# Patient Record
Sex: Female | Born: 1950 | Race: White | Hispanic: No | Marital: Single | Smoking: Never smoker
Health system: Southern US, Community
[De-identification: ages and names within clinical notes are randomized; demographics above are authoritative.]

## PROBLEM LIST (undated history)

## (undated) DIAGNOSIS — I10 Essential (primary) hypertension: Secondary | ICD-10-CM

## (undated) DIAGNOSIS — E785 Hyperlipidemia, unspecified: Secondary | ICD-10-CM

## (undated) DIAGNOSIS — F32A Depression, unspecified: Secondary | ICD-10-CM

## (undated) DIAGNOSIS — Z87442 Personal history of urinary calculi: Secondary | ICD-10-CM

## (undated) DIAGNOSIS — N189 Chronic kidney disease, unspecified: Secondary | ICD-10-CM

## (undated) DIAGNOSIS — F329 Major depressive disorder, single episode, unspecified: Secondary | ICD-10-CM

## (undated) DIAGNOSIS — J4530 Mild persistent asthma, uncomplicated: Secondary | ICD-10-CM

## (undated) DIAGNOSIS — M545 Low back pain, unspecified: Secondary | ICD-10-CM

## (undated) DIAGNOSIS — M1990 Unspecified osteoarthritis, unspecified site: Secondary | ICD-10-CM

## (undated) DIAGNOSIS — G4700 Insomnia, unspecified: Secondary | ICD-10-CM

## (undated) DIAGNOSIS — G8929 Other chronic pain: Secondary | ICD-10-CM

## (undated) DIAGNOSIS — F411 Generalized anxiety disorder: Secondary | ICD-10-CM

## (undated) DIAGNOSIS — J45909 Unspecified asthma, uncomplicated: Secondary | ICD-10-CM

## (undated) DIAGNOSIS — G2581 Restless legs syndrome: Secondary | ICD-10-CM

## (undated) DIAGNOSIS — T7840XA Allergy, unspecified, initial encounter: Secondary | ICD-10-CM

## (undated) DIAGNOSIS — R0600 Dyspnea, unspecified: Secondary | ICD-10-CM

## (undated) HISTORY — PX: TONSILLECTOMY: SUR1361

## (undated) HISTORY — PX: EXPLORATORY LAPAROTOMY W/ BOWEL RESECTION: SHX1544

## (undated) HISTORY — DX: Depression, unspecified: F32.A

## (undated) HISTORY — DX: Allergy, unspecified, initial encounter: T78.40XA

## (undated) HISTORY — PX: CATARACT EXTRACTION W/ INTRAOCULAR LENS  IMPLANT, BILATERAL: SHX1307

## (undated) HISTORY — PX: ORIF ANKLE FRACTURE: SUR919

## (undated) HISTORY — DX: Essential (primary) hypertension: I10

## (undated) HISTORY — DX: Major depressive disorder, single episode, unspecified: F32.9

## (undated) HISTORY — PX: LITHOTRIPSY: SUR834

## (undated) HISTORY — DX: Hyperlipidemia, unspecified: E78.5

## (undated) HISTORY — DX: Unspecified asthma, uncomplicated: J45.909

---
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
## 1998-08-09 ENCOUNTER — Other Ambulatory Visit: Admission: RE | Admit: 1998-08-09 | Discharge: 1998-08-09 | Payer: Self-pay | Admitting: Obstetrics and Gynecology

## 1999-08-10 ENCOUNTER — Other Ambulatory Visit: Admission: RE | Admit: 1999-08-10 | Discharge: 1999-08-10 | Payer: Self-pay | Admitting: Obstetrics and Gynecology

## 2000-04-11 ENCOUNTER — Inpatient Hospital Stay (HOSPITAL_COMMUNITY): Admission: EM | Admit: 2000-04-11 | Discharge: 2000-04-12 | Payer: Self-pay

## 2000-04-11 ENCOUNTER — Encounter: Payer: Self-pay | Admitting: Emergency Medicine

## 2000-09-13 ENCOUNTER — Other Ambulatory Visit: Admission: RE | Admit: 2000-09-13 | Discharge: 2000-09-13 | Payer: Self-pay | Admitting: Obstetrics and Gynecology

## 2001-08-25 ENCOUNTER — Ambulatory Visit (HOSPITAL_COMMUNITY): Admission: RE | Admit: 2001-08-25 | Discharge: 2001-08-25 | Payer: Self-pay | Admitting: *Deleted

## 2001-10-06 ENCOUNTER — Other Ambulatory Visit: Admission: RE | Admit: 2001-10-06 | Discharge: 2001-10-06 | Payer: Self-pay | Admitting: Obstetrics and Gynecology

## 2002-12-11 ENCOUNTER — Other Ambulatory Visit: Admission: RE | Admit: 2002-12-11 | Discharge: 2002-12-11 | Payer: Self-pay | Admitting: Obstetrics and Gynecology

## 2003-06-05 HISTORY — PX: OTHER SURGICAL HISTORY: SHX169

## 2004-01-03 DIAGNOSIS — Z8719 Personal history of other diseases of the digestive system: Secondary | ICD-10-CM

## 2004-01-03 HISTORY — DX: Personal history of other diseases of the digestive system: Z87.19

## 2004-01-15 ENCOUNTER — Encounter (INDEPENDENT_AMBULATORY_CARE_PROVIDER_SITE_OTHER): Payer: Self-pay | Admitting: Specialist

## 2004-01-15 ENCOUNTER — Inpatient Hospital Stay (HOSPITAL_COMMUNITY): Admission: EM | Admit: 2004-01-15 | Discharge: 2004-01-25 | Payer: Self-pay | Admitting: Emergency Medicine

## 2005-06-04 HISTORY — PX: FRACTURE SURGERY: SHX138

## 2005-06-05 ENCOUNTER — Other Ambulatory Visit: Admission: RE | Admit: 2005-06-05 | Discharge: 2005-06-05 | Payer: Self-pay | Admitting: Obstetrics and Gynecology

## 2006-08-01 ENCOUNTER — Ambulatory Visit (HOSPITAL_BASED_OUTPATIENT_CLINIC_OR_DEPARTMENT_OTHER): Admission: RE | Admit: 2006-08-01 | Discharge: 2006-08-01 | Payer: Self-pay | Admitting: Urology

## 2006-08-15 ENCOUNTER — Ambulatory Visit (HOSPITAL_COMMUNITY): Admission: RE | Admit: 2006-08-15 | Discharge: 2006-08-15 | Payer: Self-pay | Admitting: Urology

## 2006-08-18 ENCOUNTER — Inpatient Hospital Stay (HOSPITAL_COMMUNITY): Admission: EM | Admit: 2006-08-18 | Discharge: 2006-08-20 | Payer: Self-pay | Admitting: Emergency Medicine

## 2006-09-02 ENCOUNTER — Ambulatory Visit (HOSPITAL_COMMUNITY): Admission: RE | Admit: 2006-09-02 | Discharge: 2006-09-02 | Payer: Self-pay | Admitting: Urology

## 2007-02-05 ENCOUNTER — Emergency Department (HOSPITAL_COMMUNITY): Admission: EM | Admit: 2007-02-05 | Discharge: 2007-02-05 | Payer: Self-pay | Admitting: Emergency Medicine

## 2007-02-12 ENCOUNTER — Inpatient Hospital Stay (HOSPITAL_COMMUNITY): Admission: AD | Admit: 2007-02-12 | Discharge: 2007-02-14 | Payer: Self-pay | Admitting: Specialist

## 2007-10-06 ENCOUNTER — Ambulatory Visit (HOSPITAL_COMMUNITY): Admission: RE | Admit: 2007-10-06 | Discharge: 2007-10-06 | Payer: Self-pay | Admitting: Urology

## 2009-08-17 ENCOUNTER — Encounter: Admission: RE | Admit: 2009-08-17 | Discharge: 2009-08-17 | Payer: Self-pay | Admitting: Dermatology

## 2010-10-20 NOTE — H&P (Signed)
[facility]  Patient:    Haley Singh, Haley Singh                        MRN: [id number] Adm. Date:  04/11/00 Attending:  Feliciana Rossetti, M.D.                         History and Physical  CHIEF COMPLAINT:  Lightheadedness.  HISTORY OF PRESENT ILLNESS:  [age]-year-old white female who became acutely ill the morning of admission, vomiting nonbloody, nonbilious material times multiple events.  The vomiting would not stop and patient felt light-headed and as though she might lose consciousness.  For this, she sat down and called 911 and was brought to [facility] Emergency Room. Dr. Ward Givens saw the patient there and noted ketonuria and on exam, deemed the patient to be hypovolemic.  Laboratory evaluation revealed hyperglycemia. Patient also endorses diarrhea, fever, chills.  No chest pain.  No headache. No arm pain or jaw pain.  No skin rash, no breakdown.  No focal numbness, paresthesias or weakness.  She does endorse no appetite and no urine output.  PAST MEDICAL HISTORY:  Osteoarthritis, acne, allergies.  PAST SURGICAL HISTORY:  None.  MEDICINES 1. Accutane. 2. Celebrex 200 mg b.i.d. 3. Effexor 150 mg p.o. q.d. 4. Allegra 180 mg p.o. q.d.  ALLERGIES:  TETRACYCLINE.  FAMILY HISTORY:  Osteoporosis, hypertension, cataracts.  No collagen vascular disease.  Maternal cancer of unknown origin.  SOCIAL HISTORY:  No tobacco.  Minimal ethanol.  No kids.  Patient is unmarried and lives alone.  REVIEW OF SYSTEMS:  No palpitations.  No bleed.  See HPI.  PHYSICAL EXAMINATION  VITAL SIGNS:  88, 120/70, 98, 12.  GENERAL:  Ill-appearing, though nontoxic, in no acute distress.  HEENT:  Pupils are equal, round and reactive to light.  Extraocular movements intact.  Fundi without exudate or hemorrhage.  Mucous membranes are tacky. Oropharynx without lesions.  NECK:  Supple.  Lymph node survey negative.  No carotid bruits.  CARDIAC:  Regular rate and  rhythm.  No murmur, rub or gallop.  LUNGS:  Clear to auscultation.  Good symmetric air movement.  ABDOMEN:  Soft.  No hepatosplenomegaly.  Diffusely tender.  No rebound tenderness.  No guarding.  Normal bowel sounds.  NEUROLOGIC:  Cranial nerves II-XII are grossly within normal limits.  Muscle strength is 5/5 and symmetric.  DTRs are 2 and symmetric.  EXTREMITIES:  No clubbing, cyanosis, or edema.  RECTAL:  Rectal tone is normal.  No blood in vault.  PELVIC:  Deferred to outpatient setting.  BREASTS:  Deferred to outpatient setting.  DATA:  Ketones in the urine.  Glucose over 200.  White blood cell count is elevated and absolute neutrophil count is elevated.  ASSESSMENT AND PLAN 1. Hypovolemia:  Replete. 2. Pain:  Morphine pump. 3. Gastroenteritis:  Conservative management. 4. General anxiety disorder:  Effexor. 5. Hyperglycemia:  Follow as outpatient. DD:  04/12/00 TD:  04/12/00 Job: [id number]

## 2010-10-20 NOTE — Op Note (Signed)
NAME:  Haley Singh, Haley Singh               ACCOUNT NO.:  [id number]   MEDICAL RECORD NO.:  [id number]          PATIENT TYPE:  AMB   LOCATION:  NESC                         FACILITY:  [facility]   PHYSICIAN:  Sigmund I. Patsi Sears, M.D.DATE OF BIRTH:  1951/03/14   DATE OF PROCEDURE:  08/01/2006  DATE OF DISCHARGE:                               OPERATIVE REPORT   PREOPERATIVE DIAGNOSIS:  Bilateral renal calculi (13 mm each).   POSTOPERATIVE DIAGNOSIS:  Bilateral renal calculi (13 mm each).   OPERATION:  Cystourethroscopy, bilateral retrograde pyelogram with  interpretation, and bilateral double-J catheter (Polaris 5-French x 24  cm).   PREPARATION:  Appropriate preanesthesia, the patient was brought to the  operating room and placed on the operating room in the dorsal supine  position where general LMA anesthesia was introduced.  She was then  replaced in the dorsal lithotomy position where the pubis was prepped  with Betadine solution and draped in usual fashion.   REVIEW OF HISTORY:  This 60 year old female has history of Accutane use  for adult acne.  The lumbar spine x-ray to evaluate for possible  Accutane toxicity revealed no abnormality of the spine, but did reveal  bilateral renal calculi.  Further evaluation revealed 13-mm calculi  bilaterally.  In addition, the patient has three stones on the right  side and two on the left side.  The large stones are at the  ureteropelvic junction.  She is now for cystoscopy, retrograde  pyelography, double-J catheter placement, and then lithotripsy.   PROCEDURE:  Cystourethroscopy accomplished and showed normal-appearing  bladder.  Bilateral retrograde pyelogram was performed, and showed that  the patient had normal caliber ureters with no evidence of dilation, no  stones, and no tortuosity.  The 13-mm renal pelvic stones were  identified, at the UPJ bilaterally, but there was no proximal  hydronephrosis.  The guidewire was passed easily around the  kidney, and  Polaris stents, 5-French x 24 cm were passed bilaterally into the  kidney, with the loop ending in the bladder.  Photo documentation was  accomplished.  The patient was then given IV Toradol, awakened, and  taken to the recovery room in good condition.      Sigmund I. Patsi Sears, M.D.  Electronically Signed     SIT/MEDQ  D:  08/01/2006  T:  08/01/2006  Job:  [id number]

## 2010-10-20 NOTE — Consult Note (Signed)
Haley Singh, Haley Singh               ACCOUNT NO.:  [id number]   MEDICAL RECORD NO.:  [id number]          PATIENT TYPE:  INP   LOCATION:  1413                         FACILITY:  [facility]   PHYSICIAN:  Maretta Bees. Vonita Moss, M.D.DATE OF BIRTH:  [birth year]   DATE OF CONSULTATION:  08/18/2006  DATE OF DISCHARGE:                                 CONSULTATION   I was asked by Dr. Donnetta Hutching to evaluate this lady who is a patient of  Dr. Patsi Sears.  She is 60 years old and had a back x-ray done and  coincidently was found to have bilateral renal stones, and she saw Dr.  Patsi Sears, who has subsequently placed bilateral double-J catheters,  and she underwent left renal lithotripsy and August 15, 2006.  She was  sent home on Septra.  She has had left flank pain and fever up to 102  over the last 2 days.  She is feeling very, very poorly.  She was seen  in the emergency room, and her urine specimen and 21-50 white cells,  many bacteria, and 11-20 red cells.  White count was slightly high at  12,600.  CT scan showed mild left hydronephrosis with interval  enlargement of the left kidney and interval left perinephric soft tissue  stranding, consistent with pyelonephritis and/or obstruction.  She had a  3 mm stone fragment in the left upper ureter, and also stones were seen  in both kidneys.  She has had a remote history of UTIs.   MEDICAL HISTORY:  1. Asthma.  2. Hypertension.  3. Depression.  4. Hyperlipidemia.   PREVIOUS SURGERIES:  Included bowel resection.   She does not smoke.  She does not drink alcohol.   FAMILY HISTORY:  Noncontributory.   ALLERGIES:  TETRACYCLINE.   MEDICATIONS:  1. Septra, as noted above.  2. Effexor XR 150 mg daily.  3. Percocet p.r.n. pain.  4. Singulair 10 mg once daily.  5. Vesicare 5 mg once a day.  6. Lotrel 40 mg daily.  7. Zyrtec-D 10 mg a day.  8. Accutane 20 mg a day.  9. Lipitor 40 mg a day.  10.Fosamax plus D 70 mg weekly.  11.Remeron 15 mg at  bedtime.  12.Advair Diskus inhaler 250/50 b.i.d.  13.Albuterol inhaler p.r.n.  14.Calcium 1200 mg b.i.d.  15.Valium 10 mg p.r.n. anxiety.   PHYSICAL EXAMINATION:  VITAL SIGNS:  Blood pressure 140/88, pulse 96,  temperature 100.6.  GENERAL:  She seems alert and oriented.  No severe distress at the  moment, and no respiratory distress.  SKIN:  Warm and dry.  ABDOMEN:  Nontender, with just some mild guarding in both flanks.   IMPRESSION:  1. Fever.  Rule out urinary tract infection.  2. Bilateral renal stones and double-J stents.  3. Depression.  4. Environmental allergies.  5. Hyperlipidemia.  6. Osteoporosis.  7. Asthma.   PLAN:  Admit for IV antibiotics.  Urine and blood cultures are pending.      Maretta Bees. Vonita Moss, M.D.  Electronically Signed     LJP/MEDQ  D:  08/18/2006  T:  08/19/2006  Job:  [id number] 

## 2010-10-20 NOTE — Discharge Summary (Signed)
[facility]  Patient:    Haley Singh, Haley Singh                      MRN: [id number] Adm. Date:  [id number] Disc. Date: [id number] Attending:  Feliciana Rossetti                           Discharge Summary  DISCHARGE DIAGNOSES: 1. Hypovolemia. 2. Gastroenteritis. 3. Generalized anxiety disorder. 4. Hyperglycemia. 5. Ketonuria.  REASON FOR HOSPITALIZATION:  Hypovolemia.  HOSPITAL COURSE:  The patient was admitted to a general medical bed and treated with bowel rest and intravenous hydration and morphine PCA pump for pain.  The patient responded well to this conservative management, and by day #2 of hospitalization felt return of her appetite, had to further diarrhea, was not nauseated, and on requiring antinausea medicines.  CONDITION AT DISCHARGE:  The patient is well appearing with normal and stable vital signs as follows:  Heart rate 88, blood pressure 120/80, temperature 98, respiratory rate 10.  DISCHARGE MEDICATIONS: 1. Effexor 150 mg one q.d. 2. Celebrex 200 mg p.o. b.i.d. 3. Allegra 180 mg p.o. q.d. 4. Accutane as directed.  FOLLOWUP:  Next follow-up is with Dr. Quintella Reichert two weeks following discharge to follow up on elevated glucose, abnormal urinalysis, and elevated white cell count. DD:  04/12/00 TD:  04/12/00 Job: [id number]

## 2010-10-20 NOTE — Discharge Summary (Signed)
NAME:  Haley Singh, HEMMINGWAY                         ACCOUNT NO.:  [id number]   MEDICAL RECORD NO.:  [id number]                   PATIENT TYPE:  INP   LOCATION:  0446                                 FACILITY:  [facility]   PHYSICIAN:  Currie Paris, M.D.           DATE OF BIRTH:  Mar 06, 1951   DATE OF ADMISSION:  01/15/2004  DATE OF DISCHARGE:  01/25/2004                                 DISCHARGE SUMMARY   FINAL DIAGNOSES:  1.  Acute small bowel obstruction secondary to luminal narrowing of      uncertain etiology.  2.  Postoperative wound infection.   HISTORY:  [name] is a generally healthy 60 year old who presented with  acute abdominal pain.  X-rays appeared to show small bowel obstruction.   HOSPITAL COURSE:  The patient was admitted and taken to the operating room  where a laparotomy and a small bowel resection were performed.  She had what  appeared to be a vascular anomaly coming across her small bowel and mid  ileum causing a complete obstruction.  She had a small bowel resection and  primary anastomosis.   Postoperatively, she initially had a fairly benign course.  She had some low-  grade fevers initially that cleared up.  Her NG was discontinued on the  third postoperative day.  She was able to begin some diet on the fourth and  fifth postoperative days, but continued to run slight elevation in her white  count.  Wound looked okay initially, but by the sixth postoperative day was  developing a little bit of inflammatory changes.  We had put wound wicks in  to try to prevent the wound infection, but by January 22, 2004, she appeared  to be developing more likely wound infection and her wound had to be opened.  She was then able to be discharged on January 25, 2004, with her wound stable  and improving, tolerating diet, and overall doing well.   LABORATORY DATA:  Initial hemoglobin of 16.7, postoperatively following  hydration down to 13.4. White count was initially 19,000,  came down to  11,000.  Her electrolytes were basically unremarkable.  EKG showed normal  sinus rhythm.   The patient is to be followed up in my office in approximately two weeks.                                               Currie Paris, M.D.    CJS/MEDQ  D:  02/01/2004  T:  02/01/2004  Job:  [id number]

## 2010-10-20 NOTE — Discharge Summary (Signed)
Haley Singh, PEED               ACCOUNT NO.:  [id number]   MEDICAL RECORD NO.:  [id number]          PATIENT TYPE:  INP   LOCATION:  1540                         FACILITY:  [facility]   PHYSICIAN:  Erasmo Leventhal, M.D.DATE OF BIRTH:  04-03-1951   DATE OF ADMISSION:  02/12/2007  DATE OF DISCHARGE:  02/14/2007                               DISCHARGE SUMMARY   ADMISSION DIAGNOSIS:  Intractable pain postop outpatient surgery with  open reduction and internal fixation of right ankle.   DISCHARGE DIAGNOSIS:  Intractable pain postop outpatient surgery with  open reduction and internal fixation of right ankle.   PROCEDURES:  None.   BRIEF HISTORY:  This is a 60 year old lady who was admitted to the  hospital after having outpatient ORIF of her ankle with intractable  pain.  She was admitted to the hospital for pain control and nursing  care.   LABORATORY VALUES:  None.   COURSE IN THE HOSPITAL:  Vital signs were stable.  The patient was alert  and oriented.  No chest pain, shortness of breath, lightheadedness.  Splint was okay.  Neurovascular status intact in her foot, and pain was  under control.  Subsequently, the patient was discharged home for  followup in the office.   CONDITION ON DISCHARGE:  Improved.   DISCHARGE MEDICATIONS:  1. Dilaudid 2 mg p.o. q.4-6h. p.r.n. pain.  2. Elavil 25 mg p.o. q.h.s.  3. Valium 5 mg one p.o. q.8h. p.r.n. nervousness.   DISCHARGE INSTRUCTIONS:  She is to elevate and ice the ankle.  Do no  weightbearing on the ankle.  Use her crutches and call if problems or  questions arise.      Jaquelyn Bitter. Chabon, P.A.    ______________________________  Erasmo Leventhal, M.D.    SJC/MEDQ  D:  02/24/2007  T:  02/25/2007  Job:  [id number]

## 2010-10-20 NOTE — Discharge Summary (Signed)
NAMEZERA, Haley Singh               ACCOUNT NO.:  [id number]   MEDICAL RECORD NO.:  [id number]          PATIENT TYPE:  INP   LOCATION:  1413                         FACILITY:  [facility]   PHYSICIAN:  Sigmund I. Patsi Sears, M.D.DATE OF BIRTH:  [birth year]   DATE OF ADMISSION:  08/18/2006  DATE OF DISCHARGE:  08/20/2006                               DISCHARGE SUMMARY   FINAL DIAGNOSES:  1. Fever and urinary tract infection.  2. Bilateral renal stones with double-J stent post lithotripsy.  3. Depression.  4. Environmental allergies.  5. Hyperlipidemia.  6. Osteoporosis.  7. Asthma.   SURGERY THIS ADMISSION:  None.   HISTORY:  Ms. Deupree is a 60 year old female with a history of  bilateral multiple renal stones.  She is status post bilateral double-J  stents and now is status post lithotripsy.  The patient has had fevers  and flank pain and was seen in the emergency room with a temperature of  102 and dehydration with urinalysis showing 21-50 white cells, many  bacteria, 11-20 red cells.  White count was 12,600.  CT showed mild left  hydronephrosis with interval enlargement of the left kidney and left  perinephric soft tissue stranding, consistent with pyelonephritis.  A 3  mm stone fragment was noted in the left upper ureter, double-J catheters  were in place.  She was admitted for IV hydration and antibiotic.   PAST MEDICAL. HISTORY:  1. Asthma.  2. Hypertension.  3. Depression.  4. Hyperlipidemia.   PAST SURGICAL HISTORY:  Bowel resection.   SOCIAL HISTORY:  Tobacco is none, alcohol is none.   FAMILY HISTORY:  Noncontributory.   ALLERGIES:  TETRACYCLINE.   CURRENT MEDICATIONS:  1. Septra 1 per day.  2. Effexor XR 150 mg a day.  3. Percocet p.r.n. pain.  4. Singulair 10 mg p.o. daily.  5. Vesicare 5 mg per day.  6. Lotrel 40 mg p.o. daily.  7. Zyrtec 10 mg 1 per day.  8. Accutane 20 mg a day.  9. Lipitor 40 mg a day.  10.Fosamax D 70 mg per week.  11.Remeron 50 mg  nightly.  12.Advair discus inhaler 250/50 b.i.d.  13.Albuterol inhaler p.r.n.  14.Calcium 1200 mg b.i.d.  15.Valium 10 mg p.r.n. anxiety.   PHYSICAL EXAMINATION:  As noted on physical exam on admission H&P on  August 18, 2006.   HOSPITAL COURSE:  Patient was admitted to the hospital, placed on  Tylenol and IV Cipro.  She recovered and is seen on March 18 with a  temperature of 99 and some sweating but feels much better.  The patient  will be discharged on Cipro 500 mg b.i.d. and Percocet p.r.n. pain.  Her  KUB shows a 3 mm segment in the left upper ureter and also a renal stone  within the left kidney.  The right kidney has three renal stones.  The  patient will need lithotripsy in approximately 2-3 weeks.  She is  discharged in stable condion.      Sigmund I. Patsi Sears, M.D.  Electronically Signed     SIT/MEDQ  D:  08/20/2006  T:  08/20/2006  Job:  [id number]

## 2010-10-20 NOTE — Procedures (Signed)
[facility]  Patient:    SEE, BEHARRY Visit Number: [id number] MRN: [id number]          Service Type: Attending:  Sabino Gasser, M.D. Dictated by:   Sabino Gasser, M.D. Proc. Date: 08/25/01                             Procedure Report  PROCEDURE:  Colonoscopy.  INDICATIONS:  Cancer screening.  ANESTHESIA:  Demerol 70 mg, Versed 6 mg.  DESCRIPTION OF PROCEDURE:  With the patient mildly sedated in the left lateral decubitus position, the Olympus videoscopic colonoscope was inserted in the rectum and passed under direct vision to the cecum, identified by the ileocecal valve and appendiceal orifice, both of which were photographed. From this point, the colonoscope was then slightly withdrawn taking circumferential views of the entire colonic mucosa stopping only in the rectum, which appeared normal in direct and showed hemorrhoids in the retroflex view. The endoscope was straightened and withdrawn. The patients vital signs and pulse oximeter remained stable. The patient tolerated the procedure well without apparent complications.  FINDINGS:  Negative colonoscopic examination to the cecum except for hemorrhoids. Exam limited somewhat by prep but no gross lesions seen.  PLAN:  Repeat examination _______ in five years. Dictated by:   Sabino Gasser, M.D. Attending:  Sabino Gasser, M.D. DD:  08/25/01 TD:  08/25/01 Job: [id number]

## 2010-10-20 NOTE — Procedures (Signed)
[facility]  Patient:    Haley Singh, Haley Singh Visit Number: [id number] MRN: [id number]          Service Type: Attending:  Sabino Gasser, M.D. Dictated by:   Sabino Gasser, M.D. Proc. Date: 08/25/01   CC:         Tinnie Gens C. Quintella Reichert, M.D.   Procedure Report  ADDENDUM TO COLONOSCOPY REPORT Dictated by:   Sabino Gasser, M.D. Attending:  Sabino Gasser, M.D. DD:  08/25/01 TD:  08/25/01 Job: [id number]

## 2010-10-20 NOTE — H&P (Signed)
NAME:  JHADE, BERKO                         ACCOUNT NO.:  [id number]   MEDICAL RECORD NO.:  [id number]                   PATIENT TYPE:  EMS   LOCATION:  ED                                   FACILITY:  [facility]   PHYSICIAN:  Currie Paris, M.D.           DATE OF BIRTH:  Nov 28, 1950   DATE OF ADMISSION:  01/15/2004  DATE OF DISCHARGE:                                HISTORY & PHYSICAL   CHIEF COMPLAINT:  Abdominal pain.   HISTORY OF PRESENT ILLNESS:  Ms. Courser was in her usual state of good  health until this morning when she developed fairly sudden onset of severe  abdominal pain.  It seemed to start her in her left upper quadrant, radiated  across the rest of the her abdomen, and she just could not get comfortable.  She had a couple loose stools, but then developed reasonably intractable  nausea and vomiting with continued severe pain.  This brought her to the  emergency room on an acute basis.  After she was here, she continued to have  severe pain until she was actually medicated, I believe, with some pain  medication, which has cut the pain somewhat.  She has continued to be  nauseated.  She cannot get relief with any particular position, but moving  around does not seem to make the pain worse, either.  She has never had any  pain similar to this in the past.  She has never had any other GI problems.   PAST MEDICAL HISTORY:   OPERATIONS:  None.   MEDICATIONS:  1. Celebrex.  2. Singulair.  3. Effexor.  4. Zyrtec.  5. Accutane.   ALLERGIES:  None known.   SOCIAL HISTORY:  The patient does not smoke.  Drinks rarely.  Lives alone.  Works as a IT trainer.   FAMILY HISTORY:  Father is deceased.  Mother is alive, but unable to get  out.   REVIEW OF SYSTEMS:  HEENT:  Mild nasal allergies.  Otherwise negative.  CHEST:  History of asthma.  Currently asymptomatic.  Uses Singulair.  HEART:  No history of murmurs, hypertension, cardiac disease, etc.  ABDOMEN:  Negative, except for HPI.  GU:  Negative.  Never had any ovarian problems,  bladder problems, etc.  EXTREMITIES:  Negative.   PHYSICAL EXAMINATION:  GENERAL:  The patient is an ill-appearing, somewhat  pale lady who is clearly uncomfortable.  VITAL SIGNS:  Blood pressure 161/103, pulse 84, respirations 20, temperature  97.  HEENT:  Head is normocephalic.  Eyes were nonicteric.  Pupils were equal,  round, and regular.  Pharynx was normal.  Dentition adequate.  NECK:  Supple.  There is no thyromegaly noted.  LUNGS:  Clear to auscultation.  No respiratory distress.  HEART:  Regular rhythm.  I hear no murmurs, rubs, or gallops.  Peripheral  pulses, carotid, femoral, dorsalis pedis are intact.  ABDOMEN:  Soft but distended.  She  is diffusely tender, more on the left  than the right.  This all seems to be direct tenderness and without a lot of  associated guarding.  There is no rebound present.  Bowel sounds were absent  when I listened.  There appeared to be no hernias noted.  EXTREMITIES:  As noted, a little bit cool, but no cyanosis or edema.  No  significant varicosities are noted.   LABORATORY DATA:  Electrolytes looked normal.  White count was 19,000,  hemoglobin 16.  CT scan is consistent with some sort of a bowel obstruction.  I reviewed the films with Dr. Audie Pinto.  There were markedly dilated loops  of small bowel with a basically empty colon.  He is concerned by the  appearance of some twisting in the left lower quadrant, perhaps some sort of  an internal hernia or volvulus based on the CT scan, but no IV oral contrast  was able to be given because of the patient's nausea and vomiting.   IMPRESSION:  Acute small bowel obstruction of uncertain etiology.   PLAN:  I think, given her severe pain, CT findings, and white count of  19,000, we need to proceed to exploratory laparotomy without further  diagnostic studies.  She has already received some IV fluids.  She  understands that  this will be an exploration, and we do not have an etiology  determined for her symptoms as yet.                                               Currie Paris, M.D.    CJS/MEDQ  D:  01/15/2004  T:  01/15/2004  Job:  [id number]

## 2010-10-20 NOTE — Op Note (Signed)
NAME:  Haley Singh, Haley Singh                         ACCOUNT NO.:  [id number]   MEDICAL RECORD NO.:  [id number]                   PATIENT TYPE:  INP   LOCATION:  0446                                 FACILITY:  [facility]   PHYSICIAN:  Currie Paris, M.D.           DATE OF BIRTH:  11/08/1950   DATE OF PROCEDURE:  01/15/2004  DATE OF DISCHARGE:                                 OPERATIVE REPORT   PREOPERATIVE DIAGNOSIS:  Acute small bowel obstruction.   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   PROCEDURE:  Exploratory laparotomy, small bowel resection.   SURGEON:  Currie Paris, M.D.   ASSISTANT:  Abigail Miyamoto, M.D.   ANESTHESIA:  General endotracheal.   CLINICAL HISTORY:  This patient is a 60 year old lady who presented acutely  with what appears to be a small bowel obstruction.  She appeared somewhat  toxic and was distended diffusely, somewhat tender but without peritoneal  signs.  After discussion of the situation with the patient, we elected to  proceed to urgent exploratory laparotomy.   DESCRIPTION OF PROCEDURE:  The patient was seen in the holding area and had  no further questions.  She was taken to the operating room.  She was given  preoperative antibiotics.  After satisfactory general endotracheal  anesthesia had been obtained, the abdomen was prepped and draped.  A midline  incision was made just above the umbilicus to the mid hypogastrium, and the  peritoneal cavity entered.  There was cloudy fluid present, and cultures  were taken.  I could initially see markedly dilated loops of small bowel,  and as I was pulling these out to try to find a point of obstruction, I  could see some very empty distal loops of small bowel.  As we continued to  pull the small bowel out, we encountered the point of obstruction.  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  It  did not appear that I could actually manipulate anything through this  narrowed area, and I elected to go ahead and resect the bowel here, taking a  few inches distal to this stricture to a few inches proximal to the end of  the bezoar.   I selected the two points to divide the bowel and scored the mesentery with  a cautery between these two points and then divided the mesentery  sequentially between [facility] clamps, tying with 2-0 silks.  Once this was  done, I packed the  antimesenteric borders together, where the distal end of  the anastomosis would be, and I opened the antimesenteric border of the  section to be resected and inserted the GIA and fired it.  The staple line  was inspected and appeared to be dry.  The common defect was closed  transversely with the stapler.  A GA60 was used here.  Once this was done,  we sent this resected specimen off the table.  The mesenteric defect was  closed with some 3-0 silks.  The wound was then copiously irrigated and  checked for hemostasis.  I was easily able to move small bowel contents  through the anastomosis, and there did not appear to be other points of  obstruction.  The abdomen was then  closed with a running #1 PDS on the fascia.  The wound was irrigated and  closed with staples, using some Telfa wicks between the staples.  Patient  tolerated the procedure well. There were no operative complications.  All  counts were correct.                                               Currie Paris, M.D.    CJS/MEDQ  D:  01/15/2004  T:  01/16/2004  Job:  [id number]

## 2011-05-23 ENCOUNTER — Other Ambulatory Visit: Payer: Self-pay | Admitting: Dermatology

## 2011-05-23 ENCOUNTER — Ambulatory Visit
Admission: RE | Admit: 2011-05-23 | Discharge: 2011-05-23 | Disposition: A | Discharge status: Home / Self Care | Payer: BC Managed Care – PPO | Source: Ambulatory Visit | Attending: Dermatology | Admitting: Dermatology

## 2011-05-23 DIAGNOSIS — M779 Enthesopathy, unspecified: Secondary | ICD-10-CM

## 2011-05-23 DIAGNOSIS — R52 Pain, unspecified: Secondary | ICD-10-CM

## 2012-10-15 ENCOUNTER — Ambulatory Visit: Payer: BC Managed Care – PPO

## 2012-10-15 ENCOUNTER — Ambulatory Visit (INDEPENDENT_AMBULATORY_CARE_PROVIDER_SITE_OTHER): Payer: BC Managed Care – PPO | Admitting: Family Medicine

## 2012-10-15 VITALS — BP 127/75 | HR 84 | Temp 98.0°F | Resp 16 | Ht 67.5 in | Wt 208.0 lb

## 2012-10-15 DIAGNOSIS — M542 Cervicalgia: Secondary | ICD-10-CM

## 2012-10-15 DIAGNOSIS — M25512 Pain in left shoulder: Secondary | ICD-10-CM

## 2012-10-15 DIAGNOSIS — M25519 Pain in unspecified shoulder: Secondary | ICD-10-CM

## 2012-10-15 MED ORDER — IBUPROFEN 600 MG PO TABS: 600.0000 mg | ORAL_TABLET | Freq: Three times a day (TID) | ORAL | Status: DC | PRN

## 2012-10-15 MED ORDER — CYCLOBENZAPRINE HCL 5 MG PO TABS: 5.0000 mg | ORAL_TABLET | Freq: Three times a day (TID) | ORAL | Status: DC | PRN

## 2012-10-15 MED ORDER — KETOROLAC TROMETHAMINE 60 MG/2ML IM SOLN
60.0000 mg | Freq: Once | INTRAMUSCULAR | Status: AC
  Administered 2012-10-15: 60 mg via INTRAMUSCULAR

## 2012-10-15 NOTE — Patient Instructions (Addendum)
Impingement Syndrome, Rotator Cuff, Bursitis with Rehab Impingement syndrome is a condition that involves inflammation of the tendons of the rotator cuff and the subacromial bursa, that causes pain in the shoulder. The rotator cuff consists of four tendons and muscles that control much of the shoulder and upper arm function. The subacromial bursa is a fluid filled sac that helps reduce friction between the rotator cuff and one of the bones of the shoulder (acromion). Impingement syndrome is usually an overuse injury that causes swelling of the bursa (bursitis), swelling of the tendon (tendonitis), and/or a tear of the tendon (strain). Strains are classified into three categories. Grade 1 strains cause pain, but the tendon is not lengthened. Grade 2 strains include a lengthened ligament, due to the ligament being stretched or partially ruptured. With grade 2 strains there is still function, although the function may be decreased. Grade 3 strains include a complete tear of the tendon or muscle, and function is usually impaired. SYMPTOMS   Pain around the shoulder, often at the outer portion of the upper arm.  Pain that gets worse with shoulder function, especially when reaching overhead or lifting.  Sometimes, aching when not using the arm.  Pain that wakes you up at night.  Sometimes, tenderness, swelling, warmth, or redness over the affected area.  Loss of strength.  Limited motion of the shoulder, especially reaching behind the back (to the back pocket or to unhook bra) or across your body.  Crackling sound (crepitation) when moving the arm.  Biceps tendon pain and inflammation (in the front of the shoulder). Worse when bending the elbow or lifting. CAUSES  Impingement syndrome is often an overuse injury, in which chronic (repetitive) motions cause the tendons or bursa to become inflamed. A strain occurs when a force is paced on the tendon or muscle that is greater than it can withstand.  Common mechanisms of injury include: Stress from sudden increase in duration, frequency, or intensity of training.  Direct hit (trauma) to the shoulder.  Aging, erosion of the tendon with normal use.  Bony bump on shoulder (acromial spur). RISK INCREASES WITH:  Contact sports (football, wrestling, boxing).  Throwing sports (baseball, tennis, volleyball).  Weightlifting and bodybuilding.  Heavy labor.  Previous injury to the rotator cuff, including impingement.  Poor shoulder strength and flexibility.  Failure to warm up properly before activity.  Inadequate protective equipment.  Old age.  Bony bump on shoulder (acromial spur). PREVENTION   Warm up and stretch properly before activity.  Allow for adequate recovery between workouts.  Maintain physical fitness:  Strength, flexibility, and endurance.  Cardiovascular fitness.  Learn and use proper exercise technique. PROGNOSIS  If treated properly, impingement syndrome usually goes away within 6 weeks. Sometimes surgery is required.  RELATED COMPLICATIONS   Longer healing time if not properly treated, or if not given enough time to heal.  Recurring symptoms, that result in a chronic condition.  Shoulder stiffness, frozen shoulder, or loss of motion.  Rotator cuff tendon tear.  Recurring symptoms, especially if activity is resumed too soon, with overuse, with a direct blow, or when using poor technique. TREATMENT  Treatment first involves the use of ice and medicine, to reduce pain and inflammation. The use of strengthening and stretching exercises may help reduce pain with activity. These exercises may be performed at home or with a therapist. If non-surgical treatment is unsuccessful after more than 6 months, surgery may be advised. After surgery and rehabilitation, activity is usually possible in 3 months.    MEDICATION  If pain medicine is needed, nonsteroidal anti-inflammatory medicines (aspirin and  ibuprofen), or other minor pain relievers (acetaminophen), are often advised.  Do not take pain medicine for 7 days before surgery.  Prescription pain relievers may be given, if your caregiver thinks they are needed. Use only as directed and only as much as you need.  Corticosteroid injections may be given by your caregiver. These injections should be reserved for the most serious cases, because they may only be given a certain number of times. HEAT AND COLD  Cold treatment (icing) should be applied for 10 to 15 minutes every 2 to 3 hours for inflammation and pain, and immediately after activity that aggravates your symptoms. Use ice packs or an ice massage.  Heat treatment may be used before performing stretching and strengthening activities prescribed by your caregiver, physical therapist, or athletic trainer. Use a heat pack or a warm water soak. SEEK MEDICAL CARE IF:   Symptoms get worse or do not improve in 4 to 6 weeks, despite treatment.  New, unexplained symptoms develop. (Drugs used in treatment may produce side effects.) EXERCISES  RANGE OF MOTION (ROM) AND STRETCHING EXERCISES - Impingement Syndrome (Rotator Cuff  Tendinitis, Bursitis) These exercises may help you when beginning to rehabilitate your injury. Your symptoms may go away with or without further involvement from your physician, physical therapist or athletic trainer. While completing these exercises, remember:   Restoring tissue flexibility helps normal motion to return to the joints. This allows healthier, less painful movement and activity.  An effective stretch should be held for at least 30 seconds.  A stretch should never be painful. You should only feel a gentle lengthening or release in the stretched tissue. STRETCH  Flexion, Standing  Stand with good posture. With an underhand grip on your right / left hand, and an overhand grip on the opposite hand, grasp a broomstick or cane so that your hands are a little  more than shoulder width apart.  Keeping your right / left elbow straight and shoulder muscles relaxed, push the stick with your opposite hand, to raise your right / left arm in front of your body and then overhead. Raise your arm until you feel a stretch in your right / left shoulder, but before you have increased shoulder pain.  Try to avoid shrugging your right / left shoulder as your arm rises, by keeping your shoulder blade tucked down and toward your mid-back spine. Hold for __________ seconds.  Slowly return to the starting position. Repeat __________ times. Complete this exercise __________ times per day. STRETCH  Abduction, Supine  Lie on your back. With an underhand grip on your right / left hand and an overhand grip on the opposite hand, grasp a broomstick or cane so that your hands are a little more than shoulder width apart.  Keeping your right / left elbow straight and your shoulder muscles relaxed, push the stick with your opposite hand, to raise your right / left arm out to the side of your body and then overhead. Raise your arm until you feel a stretch in your right / left shoulder, but before you have increased shoulder pain.  Try to avoid shrugging your right / left shoulder as your arm rises, by keeping your shoulder blade tucked down and toward your mid-back spine. Hold for __________ seconds.  Slowly return to the starting position. Repeat __________ times. Complete this exercise __________ times per day. ROM  Flexion, Active-Assisted  Lie on your back.   You may bend your knees for comfort.  Grasp a broomstick or cane so your hands are about shoulder width apart. Your right / left hand should grip the end of the stick, so that your hand is positioned "thumbs-up," as if you were about to shake hands.  Using your healthy arm to lead, raise your right / left arm overhead, until you feel a gentle stretch in your shoulder. Hold for __________ seconds.  Use the stick to  assist in returning your right / left arm to its starting position. Repeat __________ times. Complete this exercise __________ times per day.  ROM - Internal Rotation, Supine   Lie on your back on a firm surface. Place your right / left elbow about 60 degrees away from your side. Elevate your elbow with a folded towel, so that the elbow and shoulder are the same height.  Using a broomstick or cane and your strong arm, pull your right / left hand toward your body until you feel a gentle stretch, but no increase in your shoulder pain. Keep your shoulder and elbow in place throughout the exercise.  Hold for __________ seconds. Slowly return to the starting position. Repeat __________ times. Complete this exercise __________ times per day. STRETCH - Internal Rotation  Place your right / left hand behind your back, palm up.  Throw a towel or belt over your opposite shoulder. Grasp the towel with your right / left hand.  While keeping an upright posture, gently pull up on the towel, until you feel a stretch in the front of your right / left shoulder.  Avoid shrugging your right / left shoulder as your arm rises, by keeping your shoulder blade tucked down and toward your mid-back spine.  Hold for __________ seconds. Release the stretch, by lowering your healthy hand. Repeat __________ times. Complete this exercise __________ times per day. ROM - Internal Rotation   Using an underhand grip, grasp a stick behind your back with both hands.  While standing upright with good posture, slide the stick up your back until you feel a mild stretch in the front of your shoulder.  Hold for __________ seconds. Slowly return to your starting position. Repeat __________ times. Complete this exercise __________ times per day.  STRETCH  Posterior Shoulder Capsule   Stand or sit with good posture. Grasp your right / left elbow and draw it across your chest, keeping it at the same height as your  shoulder.  Pull your elbow, so your upper arm comes in closer to your chest. Pull until you feel a gentle stretch in the back of your shoulder.  Hold for __________ seconds. Repeat __________ times. Complete this exercise __________ times per day. STRENGTHENING EXERCISES - Impingement Syndrome (Rotator Cuff Tendinitis, Bursitis) These exercises may help you when beginning to rehabilitate your injury. They may resolve your symptoms with or without further involvement from your physician, physical therapist or athletic trainer. While completing these exercises, remember:  Muscles can gain both the endurance and the strength needed for everyday activities through controlled exercises.  Complete these exercises as instructed by your physician, physical therapist or athletic trainer. Increase the resistance and repetitions only as guided.  You may experience muscle soreness or fatigue, but the pain or discomfort you are trying to eliminate should never worsen during these exercises. If this pain does get worse, stop and make sure you are following the directions exactly. If the pain is still present after adjustments, discontinue the exercise until you can discuss   the trouble with your clinician.  During your recovery, avoid activity or exercises which involve actions that place your injured hand or elbow above your head or behind your back or head. These positions stress the tissues which you are trying to heal. STRENGTH - Scapular Depression and Adduction   With good posture, sit on a firm chair. Support your arms in front of you, with pillows, arm rests, or on a table top. Have your elbows in line with the sides of your body.  Gently draw your shoulder blades down and toward your mid-back spine. Gradually increase the tension, without tensing the muscles along the top of your shoulders and the back of your neck.  Hold for __________ seconds. Slowly release the tension and relax your muscles  completely before starting the next repetition.  After you have practiced this exercise, remove the arm support and complete the exercise in standing as well as sitting position. Repeat __________ times. Complete this exercise __________ times per day.  STRENGTH - Shoulder Abductors, Isometric  With good posture, stand or sit about 4-6 inches from a wall, with your right / left side facing the wall.  Bend your right / left elbow. Gently press your right / left elbow into the wall. Increase the pressure gradually, until you are pressing as hard as you can, without shrugging your shoulder or increasing any shoulder discomfort.  Hold for __________ seconds.  Release the tension slowly. Relax your shoulder muscles completely before you begin the next repetition. Repeat __________ times. Complete this exercise __________ times per day.  STRENGTH - External Rotators, Isometric  Keep your right / left elbow at your side and bend it 90 degrees.  Step into a door frame so that the outside of your right / left wrist can press against the door frame without your upper arm leaving your side.  Gently press your right / left wrist into the door frame, as if you were trying to swing the back of your hand away from your stomach. Gradually increase the tension, until you are pressing as hard as you can, without shrugging your shoulder or increasing any shoulder discomfort.  Hold for __________ seconds.  Release the tension slowly. Relax your shoulder muscles completely before you begin the next repetition. Repeat __________ times. Complete this exercise __________ times per day.  STRENGTH - Supraspinatus   Stand or sit with good posture. Grasp a __________ weight, or an exercise band or tubing, so that your hand is "thumbs-up," like you are shaking hands.  Slowly lift your right / left arm in a "V" away from your thigh, diagonally into the space between your side and straight ahead. Lift your hand to  shoulder height or as far as you can, without increasing any shoulder pain. At first, many people do not lift their hands above shoulder height.  Avoid shrugging your right / left shoulder as your arm rises, by keeping your shoulder blade tucked down and toward your mid-back spine.  Hold for __________ seconds. Control the descent of your hand, as you slowly return to your starting position. Repeat __________ times. Complete this exercise __________ times per day.  STRENGTH - External Rotators  Secure a rubber exercise band or tubing to a fixed object (table, pole) so that it is at the same height as your right / left elbow when you are standing or sitting on a firm surface.  Stand or sit so that the secured exercise band is at your uninjured side.  Bend   your right / left elbow 90 degrees. Place a folded towel or small pillow under your right / left arm, so that your elbow is a few inches away from your side.  Keeping the tension on the exercise band, pull it away from your body, as if pivoting on your elbow. Be sure to keep your body steady, so that the movement is coming only from your rotating shoulder.  Hold for __________ seconds. Release the tension in a controlled manner, as you return to the starting position. Repeat __________ times. Complete this exercise __________ times per day.  STRENGTH - Internal Rotators   Secure a rubber exercise band or tubing to a fixed object (table, pole) so that it is at the same height as your right / left elbow when you are standing or sitting on a firm surface.  Stand or sit so that the secured exercise band is at your right / left side.  Bend your elbow 90 degrees. Place a folded towel or small pillow under your right / left arm so that your elbow is a few inches away from your side.  Keeping the tension on the exercise band, pull it across your body, toward your stomach. Be sure to keep your body steady, so that the movement is coming only from  your rotating shoulder.  Hold for __________ seconds. Release the tension in a controlled manner, as you return to the starting position. Repeat __________ times. Complete this exercise __________ times per day.  STRENGTH - Scapular Protractors, Standing   Stand arms length away from a wall. Place your hands on the wall, keeping your elbows straight.  Begin by dropping your shoulder blades down and toward your mid-back spine.  To strengthen your protractors, keep your shoulder blades down, but slide them forward on your rib cage. It will feel as if you are lifting the back of your rib cage away from the wall. This is a subtle motion and can be challenging to complete. Ask your caregiver for further instruction, if you are not sure you are doing the exercise correctly.  Hold for __________ seconds. Slowly return to the starting position, resting the muscles completely before starting the next repetition. Repeat __________ times. Complete this exercise __________ times per day. STRENGTH - Scapular Protractors, Supine  Lie on your back on a firm surface. Extend your right / left arm straight into the air while holding a __________ weight in your hand.  Keeping your head and back in place, lift your shoulder off the floor.  Hold for __________ seconds. Slowly return to the starting position, and allow your muscles to relax completely before starting the next repetition. Repeat __________ times. Complete this exercise __________ times per day. STRENGTH - Scapular Protractors, Quadruped  Get onto your hands and knees, with your shoulders directly over your hands (or as close as you can be, comfortably).  Keeping your elbows locked, lift the back of your rib cage up into your shoulder blades, so your mid-back rounds out. Keep your neck muscles relaxed.  Hold this position for __________ seconds. Slowly return to the starting position and allow your muscles to relax completely before starting the  next repetition. Repeat __________ times. Complete this exercise __________ times per day.  STRENGTH - Scapular Retractors  Secure a rubber exercise band or tubing to a fixed object (table, pole), so that it is at the height of your shoulders when you are either standing, or sitting on a firm armless chair.  With a   palm down grip, grasp an end of the band in each hand. Straighten your elbows and lift your hands straight in front of you, at shoulder height. Step back, away from the secured end of the band, until it becomes tense.  Squeezing your shoulder blades together, draw your elbows back toward your sides, as you bend them. Keep your upper arms lifted away from your body throughout the exercise.  Hold for __________ seconds. Slowly ease the tension on the band, as you reverse the directions and return to the starting position. Repeat __________ times. Complete this exercise __________ times per day. STRENGTH - Shoulder Extensors   Secure a rubber exercise band or tubing to a fixed object (table, pole) so that it is at the height of your shoulders when you are either standing, or sitting on a firm armless chair.  With a thumbs-up grip, grasp an end of the band in each hand. Straighten your elbows and lift your hands straight in front of you, at shoulder height. Step back, away from the secured end of the band, until it becomes tense.  Squeezing your shoulder blades together, pull your hands down to the sides of your thighs. Do not allow your hands to go behind you.  Hold for __________ seconds. Slowly ease the tension on the band, as you reverse the directions and return to the starting position. Repeat __________ times. Complete this exercise __________ times per day.  STRENGTH - Scapular Retractors and External Rotators   Secure a rubber exercise band or tubing to a fixed object (table, pole) so that it is at the height as your shoulders, when you are either standing, or sitting on a  firm armless chair.  With a palm down grip, grasp an end of the band in each hand. Bend your elbows 90 degrees and lift your elbows to shoulder height, at your sides. Step back, away from the secured end of the band, until it becomes tense.  Squeezing your shoulder blades together, rotate your shoulders so that your upper arms and elbows remain stationary, but your fists travel upward to head height.  Hold for __________ seconds. Slowly ease the tension on the band, as you reverse the directions and return to the starting position. Repeat __________ times. Complete this exercise __________ times per day.  STRENGTH - Scapular Retractors and External Rotators, Rowing   Secure a rubber exercise band or tubing to a fixed object (table, pole) so that it is at the height of your shoulders, when you are either standing, or sitting on a firm armless chair.  With a palm down grip, grasp an end of the band in each hand. Straighten your elbows and lift your hands straight in front of you, at shoulder height. Step back, away from the secured end of the band, until it becomes tense.  Step 1: Squeeze your shoulder blades together. Bending your elbows, draw your hands to your chest, as if you are rowing a boat. At the end of this motion, your hands and elbow should be at shoulder height and your elbows should be out to your sides.  Step 2: Rotate your shoulders, to raise your hands above your head. Your forearms should be vertical and your upper arms should be horizontal.  Hold for __________ seconds. Slowly ease the tension on the band, as you reverse the directions and return to the starting position. Repeat __________ times. Complete this exercise __________ times per day.  STRENGTH  Scapular Depressors  Find a sturdy chair   without wheels, such as a dining room chair.  Keeping your feet on the floor, and your hands on the chair arms, lift your bottom up from the seat, and lock your elbows.  Keeping  your elbows straight, allow gravity to pull your body weight down. Your shoulders will rise toward your ears.  Raise your body against gravity by drawing your shoulder blades down your back, shortening the distance between your shoulders and ears. Although your feet should always maintain contact with the floor, your feet should progressively support less body weight, as you get stronger.  Hold for __________ seconds. In a controlled and slow manner, lower your body weight to begin the next repetition. Repeat __________ times. Complete this exercise __________ times per day.  Document Released: 05/21/2005 Document Revised: 08/13/2011 Document Reviewed: 09/02/2008 [employer] Patient Information 2013 [employer]  

## 2012-10-15 NOTE — Progress Notes (Signed)
[address]   [place], [place]  [address]   [phone]  Subjective:    Patient ID: Haley Singh, female    DOB: 1950-12-30, 62 y.o.   MRN: [id number]  HPI This 62 y.o. female presents for evaluation of shoulder pain L.  Thought had slept funny but now persistent and killing patient.  Cannot raise it.  Onset yesterday upon awakening.  Mild pain with onset with progressive worsening; last night applied heat without improvement.  Upon awakening, pain much worse.  Taking Hydrocodone/Ibuprofen 7.5/200 one this morning, lunch time, afternoon.  Having itching with medication.  No n/t.  No similar symptoms.  No overuse injury.  IT support as profession.  No heavy lifting.   Dr. Ranell Patrick is ortho.  No associated neck pain.  No history of shoulder issues/pain or injury.  PCP:  [facility].    Review of Systems  Constitutional: Negative for chills, diaphoresis and fatigue.  Musculoskeletal: Positive for myalgias, back pain and arthralgias. Negative for joint swelling and gait problem.  Skin: Negative for rash.  Neurological: Positive for weakness. Negative for dizziness, light-headedness, numbness and headaches.    Past Medical History  Diagnosis Date  . Hyperlipidemia   . Hypertension   . Asthma   . Depression   . Allergy     History reviewed. No pertinent past surgical history.  Prior to Admission medications   Medication Sig Start Date End Date Taking? Authorizing Provider  amLODipine-benazepril (LOTREL) 10-40 MG per capsule Take 1 capsule by mouth daily.   Yes Historical Provider, MD  atorvastatin (LIPITOR) 40 MG tablet Take 40 mg by mouth daily.   Yes Historical Provider, MD  celecoxib (CELEBREX) 200 MG capsule Take 200 mg by mouth 2 (two) times daily.   Yes Historical Provider, MD  cetirizine (ZYRTEC) 10 MG tablet Take 10 mg by mouth daily.   Yes Historical Provider, MD  mirtazapine (REMERON) 45 MG tablet Take 45 mg by mouth at bedtime.   Yes Historical  Provider, MD  montelukast (SINGULAIR) 10 MG tablet Take 10 mg by mouth daily.   Yes Historical Provider, MD  venlafaxine XR (EFFEXOR-XR) 150 MG 24 hr capsule Take 300 mg by mouth daily.   Yes Historical Provider, MD    Allergies  Allergen Reactions  . Oxycontin (Oxycodone Hcl Er)   . Tetracyclines & Related     History   Social History  . Marital Status: Single    Spouse Name: N/A    Number of Children: N/A  . Years of Education: N/A   Occupational History  . Not on file.   Social History Main Topics  . Smoking status: Never Smoker   . Smokeless tobacco: Not on file  . Alcohol Use: Yes  . Drug Use: No  . Sexually Active: Not on file   Other Topics Concern  . Not on file   Social History Narrative  . No narrative on file    History reviewed. No pertinent family history.     Objective:   Physical Exam  Nursing note and vitals reviewed. Constitutional: She is oriented to person, place, and time. She appears well-developed and well-nourished. She appears distressed.  Mild distress due to pain.  HENT:  Head: Normocephalic and atraumatic.  Eyes: Conjunctivae and EOM are normal. Pupils are equal, round, and reactive to light.  Neck: Neck supple.  Cardiovascular: Normal rate, regular rhythm and normal heart sounds.   Pulmonary/Chest: Effort normal and breath sounds normal. She has no  wheezes. She has no rales.  Musculoskeletal:       Left shoulder: She exhibits decreased range of motion, tenderness, pain, spasm and decreased strength. She exhibits no bony tenderness, no swelling, no effusion, no crepitus, no deformity, no laceration and normal pulse.       Cervical back: She exhibits decreased range of motion, tenderness, pain and spasm. She exhibits no bony tenderness, no swelling, no edema, no deformity, no laceration and normal pulse.  Neck:  No midline ttp; L trapezius TTP mild; pain with flexion, rotation side to side, lateral bending. L shoulder:  +TTP bicipital  groove; no deltoid TTP; +pain with elevation to 45 degrees; unable to actively elevate above 45 degrees.  +able to actively elevate to 90 degrees without pain or limitation.  Grip 5/5.  Full extension and flexion of L wrist.  Cross over positive.  Neurological: She is alert and oriented to person, place, and time.  Skin: No rash noted. She is not diaphoretic.  Psychiatric: She has a normal mood and affect. Her behavior is normal.   [facility] reading (PRIMARY) by  Dr. Katrinka Blazing.   L SHOULDER: NAD   C-SPINE:  SEVERE SPURRING AND DEGENERATIVE LEVELS MULTIPLE LEVELS.   TORADOL 60MG  IM ADMINISTERED IN OFFICE.        Assessment & Plan:  Neck pain - Plan: DG Cervical Spine 2 or 3 views, ketorolac (TORADOL) injection 60 mg  Left shoulder pain - Plan: DG Shoulder Left, ketorolac (TORADOL) injection 60 mg  1.  Neck pain/strain:  New.  Mild.  S/p Toradol.  Rx for Ibuprofen, Flexeril.  Heat to neck; passive range of motion exercises to be performed daily.  RTC for worsening symptoms. 2.  L shoulder pain/strain:  New.  Moderate to severe; s/p Toradol injection; rx for Ibuprofen, Flexeril.  Continue Ibuprofen/hydrocodone 1 every six hours PRN pain.  Passive range of motion exercises; if no improvement in two weeks, recommend ortho consultation.  Meds ordered this encounter  Medications  . venlafaxine XR (EFFEXOR-XR) 150 MG 24 hr capsule    Sig: Take 300 mg by mouth daily.  . mirtazapine (REMERON) 45 MG tablet    Sig: Take 45 mg by mouth at bedtime.  Marland Kitchen atorvastatin (LIPITOR) 40 MG tablet    Sig: Take 40 mg by mouth daily.  Marland Kitchen amLODipine-benazepril (LOTREL) 10-40 MG per capsule    Sig: Take 1 capsule by mouth daily.  . celecoxib (CELEBREX) 200 MG capsule    Sig: Take 200 mg by mouth 2 (two) times daily.  . montelukast (SINGULAIR) 10 MG tablet    Sig: Take 10 mg by mouth daily.  . cetirizine (ZYRTEC) 10 MG tablet    Sig: Take 10 mg by mouth daily.  Marland Kitchen ketorolac (TORADOL) injection 60 mg    Sig:   .  ibuprofen (ADVIL,MOTRIN) 600 MG tablet    Sig: Take 1 tablet (600 mg total) by mouth every 8 (eight) hours as needed for pain.    Dispense:  40 tablet    Refill:  0  . cyclobenzaprine (FLEXERIL) 5 MG tablet    Sig: Take 1 tablet (5 mg total) by mouth 3 (three) times daily as needed for muscle spasms.    Dispense:  30 tablet    Refill:  1

## 2013-01-21 ENCOUNTER — Ambulatory Visit
Admission: RE | Admit: 2013-01-21 | Discharge: 2013-01-21 | Disposition: A | Discharge status: Home / Self Care | Payer: BC Managed Care – PPO | Source: Ambulatory Visit | Attending: Family Medicine | Admitting: Family Medicine

## 2013-01-21 ENCOUNTER — Other Ambulatory Visit: Payer: Self-pay | Admitting: Family Medicine

## 2013-01-21 DIAGNOSIS — R059 Cough, unspecified: Secondary | ICD-10-CM

## 2013-01-21 DIAGNOSIS — R062 Wheezing: Secondary | ICD-10-CM

## 2013-01-21 DIAGNOSIS — R05 Cough: Secondary | ICD-10-CM

## 2013-10-08 ENCOUNTER — Other Ambulatory Visit: Payer: Self-pay | Admitting: Urology

## 2013-10-29 ENCOUNTER — Encounter (HOSPITAL_COMMUNITY): Payer: Self-pay | Admitting: Pharmacy Technician

## 2013-11-11 ENCOUNTER — Encounter (HOSPITAL_COMMUNITY): Payer: Self-pay | Admitting: *Deleted

## 2013-11-16 ENCOUNTER — Ambulatory Visit (HOSPITAL_COMMUNITY)
Admission: RE | Admit: 2013-11-16 | Discharge: 2013-11-16 | Disposition: A | Discharge status: Home / Self Care | Payer: Medicare Other | Source: Ambulatory Visit | Attending: Urology | Admitting: Urology

## 2013-11-16 ENCOUNTER — Encounter (HOSPITAL_COMMUNITY)
Admission: RE | Disposition: A | Discharge status: Home / Self Care | Payer: Self-pay | Source: Ambulatory Visit | Attending: Urology

## 2013-11-16 ENCOUNTER — Ambulatory Visit (HOSPITAL_COMMUNITY): Payer: Medicare Other

## 2013-11-16 ENCOUNTER — Encounter (HOSPITAL_COMMUNITY): Payer: Self-pay | Admitting: *Deleted

## 2013-11-16 DIAGNOSIS — I10 Essential (primary) hypertension: Secondary | ICD-10-CM | POA: Insufficient documentation

## 2013-11-16 DIAGNOSIS — N200 Calculus of kidney: Secondary | ICD-10-CM

## 2013-11-16 DIAGNOSIS — Z79899 Other long term (current) drug therapy: Secondary | ICD-10-CM | POA: Insufficient documentation

## 2013-11-16 DIAGNOSIS — J45909 Unspecified asthma, uncomplicated: Secondary | ICD-10-CM | POA: Insufficient documentation

## 2013-11-16 DIAGNOSIS — E7800 Pure hypercholesterolemia, unspecified: Secondary | ICD-10-CM | POA: Insufficient documentation

## 2013-11-16 HISTORY — DX: Chronic kidney disease, unspecified: N18.9

## 2013-11-16 HISTORY — DX: Unspecified osteoarthritis, unspecified site: M19.90

## 2013-11-16 SURGERY — LITHOTRIPSY, ESWL
Anesthesia: LOCAL | Laterality: Left

## 2013-11-16 MED ORDER — CIPROFLOXACIN HCL 500 MG PO TABS
500.0000 mg | ORAL_TABLET | ORAL | Status: AC
  Administered 2013-11-16: 500 mg via ORAL
  Filled 2013-11-16: qty 1

## 2013-11-16 MED ORDER — DIPHENHYDRAMINE HCL 25 MG PO CAPS
25.0000 mg | ORAL_CAPSULE | ORAL | Status: AC
  Administered 2013-11-16: 25 mg via ORAL
  Filled 2013-11-16: qty 1

## 2013-11-16 MED ORDER — DEXTROSE-NACL 5-0.45 % IV SOLN
INTRAVENOUS | Status: DC
  Administered 2013-11-16: 08:00:00 via INTRAVENOUS

## 2013-11-16 MED ORDER — DIAZEPAM 5 MG PO TABS
10.0000 mg | ORAL_TABLET | ORAL | Status: AC
  Administered 2013-11-16: 10 mg via ORAL
  Filled 2013-11-16: qty 2

## 2013-11-16 NOTE — H&P (Signed)
Reason For Visit Yearly f/u & KUB   Active Problems Problems  1. Chronic cystitis (595.2)   Assessed By: Jetta LoutWarden, Diane (Urology); Last Assessed: 25 Jan 2009 2. Nephrolithiasis (592.0)   Assessed By: Jethro Bolusannenbaum, Matteus Mcnelly (Urology); Last Assessed: 02 Oct 2013  History of Present Illness 63 year old female returns today for a yearly f/u & KUB. Has a history of bilateral nephrolithasis. Has been treated with ESWL X 3.  Last ESWL 10/06/07. Denies flank pain, dysuria, or hematuria. No longer on Nitrofurantoin suppression therapy. Has not had any recurrent UTI in over a year.   Past Medical History Problems  1. History of Arthritis (V13.4) 2. History of Asthma (493.90) 3. History of Calculus of ureter (592.1) 4. History of Calculus of ureter (592.1) 5. History of Flank Pain Right 6. History of depression (V11.8) 7. History of hypercholesterolemia (V12.29) 8. History of hypertension (V12.59) 9. History of kidney stones (V13.01) 10. History of Hydroureter (593.5) 11. History of Nephrolithiasis Of Both Kidneys (V13.01)  Surgical History Problems  1. History of Cataract Surgery 2. History of Colon Surgery 3. History of Cystoscopy With Insertion Of Ureteral Stent Bilateral 4. History of Foot Surgery 5. History of Lithotripsy 6. History of Lithotripsy 7. History of Lithotripsy  Current Meds 1. Accutane 20 MG CAPS;  Therapy: (Recorded:20Feb2008) to Recorded 2. CeleBREX 200 MG Oral Capsule;  Therapy: 23Dec2010 to Recorded 3. Effexor XR 150 MG Oral Capsule Extended Release 24 Hour;  Therapy: 18Nov2007 to Recorded 4. Lipitor 40 MG Oral Tablet;  Therapy: 15Nov2007 to Recorded 5. Lotrel 10-40 MG Oral Capsule;  Therapy: 11Oct2007 to Recorded 6. Mirtazapine 45 MG Oral Tablet;  Therapy: 03Dec2010 to Recorded 7. Singulair 10 MG Oral Tablet;  Therapy: 21Sep2007 to Recorded 8. Zyrtec-D 5-120 MG TB12;  Therapy: 28Dec2007 to Recorded  Allergies Medication  1. Flomax CP24 2.  Tetracycline HCl CAPS 3. Utira-C TABS 4. Oxybutynin Chloride TABS  Family History Problems  1. Family history of Arthritis (V17.7) : Mother 2. Family history of Arthritis (V17.7)   grandmother 3. Family history of Breast Cancer (V16.3)   aunt 4. Family history of Hypertension (V17.49) : Father 5. Family history of Prostate Cancer (Z61.09(V16.42) : Father 6. Family history of Urologic Disorder (V18.7) : Father   kidney stones, blood in urine  Social History Problems  1. Alcohol Use 2. Caffeine Use   3-4 3. Marital History - Single 4. Never A Smoker 5. Occupation:   retired 6. Denied: Tobacco Use  Review of Systems Genitourinary, constitutional, skin, eye, otolaryngeal, hematologic/lymphatic, cardiovascular, pulmonary, endocrine, musculoskeletal, gastrointestinal, neurological and psychiatric system(s) were reviewed and pertinent findings if present are noted.    Vitals Vital Signs [Data Includes: Last 1 Day]  Recorded: 01May2015 02:59PM  Blood Pressure: 122 / 76 Temperature: 97.7 F Heart Rate: 70  Physical Exam Constitutional: Well nourished and well developed . No acute distress.  ENT:. The ears and nose are normal in appearance.  Neck: The appearance of the neck is normal and no neck mass is present.  Pulmonary: No respiratory distress and normal respiratory rhythm and effort.  Cardiovascular:. No peripheral edema.  Abdomen: The abdomen is mildly obese. The abdomen is soft and nontender. No masses are palpated. No CVA tenderness. No hernias are palpable. No hepatosplenomegaly noted.  Lymphatics: The femoral and inguinal nodes are not enlarged or tender.  Skin: Normal skin turgor, no visible rash and no visible skin lesions.  Neuro/Psych:. Mood and affect are appropriate.    Results/Data Urine [  Data Includes: Last 1 Day]   01May2015  COLOR YELLOW   APPEARANCE CLEAR   SPECIFIC GRAVITY 1.015   pH 5.5   GLUCOSE NEG mg/dL  BILIRUBIN NEG   KETONE NEG mg/dL  BLOOD  TRACE   PROTEIN NEG mg/dL  UROBILINOGEN 0.2 mg/dL  NITRITE NEG   LEUKOCYTE ESTERASE SMALL   SQUAMOUS EPITHELIAL/HPF RARE   WBC 0-2 WBC/hpf  RBC 0-2 RBC/hpf  BACTERIA NONE SEEN   CRYSTALS NONE SEEN   CASTS NONE SEEN    KUB: R renal pelvic Ca++: 10.4 x 12.2 at the medial portion of the renal pelvis. Ribs normal.    Right kidney: 5mm mid pole stone.   Assessment Assessed  1. Kidney stone on left side (592.0) 2. Nephrolithiasis (592.0)  Bilateral nephrolithiasis, asymptomatic. No hematuria or flank pain. L renal stone appears larger, 12mm x 10mm and at the medial portion of the renal pelvis. The Right nephrolithiasis is 5mm in the Right mid pole cortex.She needs CT stone protocol for evaluation and then lithotiopsy.   Plan Health Maintenance  1. UA With REFLEX; [Do Not Release]; Status:Resulted - Requires  Verification;   Done: 01May2015 02:35PM Nephrolithiasis  2. AU CT-STONE PROTOCOL; Status:Hold For - Appointment,PreCert,Date  of Service,Print; Requested for:01May2015;  3. KUB; Status:Resulted - Requires Verification;   Done: 01May2015 12:00AM  CT stone protocol and then possible lithotripsy.   Discussion/Summary cc: Dr. Leilani AbleBetti [facility], [facility]     Signatures Electronically signed by : Jethro BolusSigmund Yash Cacciola, M.D.; Oct 02 2013  3:25PM EST

## 2013-11-16 NOTE — Interval H&P Note (Signed)
History and Physical Interval Note:  11/16/2013 8:22 AM  Haley SpiresJanet E Quinley  has presented today for surgery, with the diagnosis of Left Renal Stone  The various methods of treatment have been discussed with the patient and family. After consideration of risks, benefits and other options for treatment, the patient has consented to  Procedure(s): LEFT EXTRACORPOREAL SHOCK WAVE LITHOTRIPSY (ESWL) (Left) as a surgical intervention .  The patient's history has been reviewed, patient examined, no change in status, stable for surgery.  I have reviewed the patient's chart and labs.  Questions were answered to the patient's satisfaction.     Jethro BolusANNENBAUM, Alece Koppel I

## 2013-11-16 NOTE — Discharge Instructions (Signed)
Lithotripsy, Care After °Refer to this sheet in the next few weeks. These instructions provide you with information on caring for yourself after your procedure. Your health care provider may also give you more specific instructions. Your treatment has been planned according to current medical practices, but problems sometimes occur. Call your health care provider if you have any problems or questions after your procedure. °WHAT TO EXPECT AFTER THE PROCEDURE  °· Your urine may have a red tinge for a few days after treatment. Blood loss is usually minimal. °· You may have soreness in the back or flank area. This usually goes away after a few days. The procedure can cause blotches or bruises on the back where the pressure wave enters the skin. These marks usually cause only minimal discomfort and should disappear in a short time. °· Stone fragments should begin to pass within 24 hours of treatment. However, a delayed passage is not unusual. °· You may have pain, discomfort, and feel sick to your stomach (nauseated) when the crushed fragments of stone are passed down the tube from the kidney to the bladder. Stone fragments can pass soon after the procedure and may last for up to 4 8 weeks. °· A small number of patients may have severe pain when stone fragments are not able to pass, which leads to an obstruction. °· If your stone is greater than 1 inch (2.5 cm) in diameter or if you have multiple stones that have a combined diameter greater than 1 inch (2.5 cm), you may require more than one treatment. °· If you had a stent placed prior to your procedure, you may experience some discomfort, especially during urination. You may experience the pain or discomfort in your flank or back, or you may experience a sharp pain or discomfort at the base of your penis or in your lower abdomen. The discomfort usually lasts only a few minutes after urinating. °HOME CARE INSTRUCTIONS  °· Rest at home until you feel your energy  improving. °· Only take over-the-counter or prescription medicines for pain, discomfort, or fever as directed by your health care provider. Depending on the type of lithotripsy, you may need to take antibiotics and anti-inflammatory medicines for a few days. °· Drink enough water and fluids to keep your urine clear or pale yellow. This helps "flush" your kidneys. It helps pass any remaining pieces of stone and prevents stones from coming back. °· Most people can resume daily activities within 1 2 days after standard lithotripsy. It can take longer to recover from laser and percutaneous lithotripsy. °· If the stones are in your urinary system, you may be asked to strain your urine at home to look for stones. Any stones that are found can be sent to a medical lab for examination. °· Visit your health care provider for a follow-up appointment in a few weeks. Your doctor may remove your stent if you have one. Your health care provider will also check to see whether stone particles still remain. °SEEK MEDICAL CARE IF:  °· Your pain is not relieved by medicine. °· You have a lasting nauseous feeling. °· You feel there is too much blood in the urine. °· You develop persistent problems with frequent or painful urination that does not at least partially improve after 2 days following the procedure. °· You have a congested cough. °· You feel lightheaded. °· You develop a rash or any other signs that might suggest an allergic problem. °· You develop any reaction or side   effects to your medicine(s). °SEEK IMMEDIATE MEDICAL CARE IF:  °· You experience severe back or flank pain or both. °· You see nothing but blood when you urinate. °· You cannot pass any urine at all. °· You have a fever or shaking chills. °· You develop shortness of breath, difficulty breathing, or chest pain. °· You develop vomiting that will not stop after 6 8 hours. °· You have a fainting episode. °Document Released: 06/10/2007 Document Revised: 03/11/2013  Document Reviewed: 12/04/2012 °Exit[employer]® Patient Information ©2014 [employer]. ° °

## 2014-03-03 ENCOUNTER — Encounter: Payer: Self-pay | Admitting: Pulmonary Disease

## 2014-03-03 ENCOUNTER — Encounter (INDEPENDENT_AMBULATORY_CARE_PROVIDER_SITE_OTHER): Payer: Self-pay

## 2014-03-03 ENCOUNTER — Ambulatory Visit (INDEPENDENT_AMBULATORY_CARE_PROVIDER_SITE_OTHER): Payer: Medicare Other | Admitting: Pulmonary Disease

## 2014-03-03 VITALS — BP 118/78 | HR 69 | Temp 98.1°F | Ht 66.5 in | Wt 190.0 lb

## 2014-03-03 DIAGNOSIS — G4733 Obstructive sleep apnea (adult) (pediatric): Secondary | ICD-10-CM | POA: Insufficient documentation

## 2014-03-03 NOTE — Progress Notes (Signed)
Chief Complaint  Patient presents with  . SLEEP CONSULT    Referred by Anna GenreStephanie Powers. Epworth Score: 10    History of Present Illness: Haley Singh is a 63 y.o. female for evaluation of sleep problems.  She has noticed trouble feeling fatigued for some time.  There was concern this could be related to some of her blood pressure medicines, but her symptoms persisted even after medications were changed.  She snores, and has been told that she stops breathing while asleep.  As a result she was referred for further sleep assessment.  She has trouble sleeping on her back, and wakes up feeling like she can breath.  She gets dry mouth at night and has to sip water frequently.  She will occasionally talk in her sleep also.  She goes to sleep at 10 pm.  She falls asleep after 15 to 30 minutes.  She wakes up 1 or 2 times to use the bathroom.  She gets out of bed at 630 am.  She feels very sluggish in the morning.  She denies morning headache.  She does not use anything to help her stay awake.  She takes trazodone before bedtime >> she can only take 50 mg >> 100 mg really knocks her out.  She will fall asleep after work on the couch sometimes, and not realize this happened.  She will then sleep on the couch for several hours before she wakes up and goes to bed.  She denies sleep walking, bruxism, or nightmares.  There is no history of restless legs.  She denies sleep hallucinations, sleep paralysis, or cataplexy.  The Epworth score is 10 out of 24.   Haley Singh  has a past medical history of Hyperlipidemia; Hypertension; Asthma; Depression; Allergy; Arthritis; and Chronic kidney disease.  Haley Singh  has past surgical history that includes bowel obstruction (2005); Fracture surgery (2007); Tonsillectomy; and Lithotripsy (2 previous times).  Prior to Admission medications   Medication Sig Start Date End Date Taking? Authorizing Provider  albuterol (PROVENTIL HFA;VENTOLIN HFA) 108 (90  BASE) MCG/ACT inhaler Inhale 1 puff into the lungs every 6 (six) hours as needed for wheezing or shortness of breath.   Yes Historical Provider, MD  amLODipine-benazepril (LOTREL) 5-10 MG per capsule Take 1 capsule by mouth every morning.   Yes Historical Provider, MD  atorvastatin (LIPITOR) 40 MG tablet Take 40 mg by mouth daily.   Yes Historical Provider, MD  beclomethasone (QVAR) 80 MCG/ACT inhaler Inhale 1 puff into the lungs daily.   Yes Historical Provider, MD  celecoxib (CELEBREX) 200 MG capsule Take 200 mg by mouth daily.    Yes Historical Provider, MD  cetirizine (ZYRTEC) 10 MG tablet Take 10 mg by mouth daily.   Yes Historical Provider, MD  cyclobenzaprine (FLEXERIL) 10 MG tablet Take 10 mg by mouth 3 (three) times daily as needed for muscle spasms.   Yes Historical Provider, MD  montelukast (SINGULAIR) 10 MG tablet Take 10 mg by mouth daily.   Yes Historical Provider, MD  traZODone (DESYREL) 100 MG tablet Take 100 mg by mouth at bedtime.   Yes Historical Provider, MD  venlafaxine XR (EFFEXOR-XR) 150 MG 24 hr capsule Take 300 mg by mouth every morning.    Yes Historical Provider, MD    Allergies  Allergen Reactions  . Oxycontin [Oxycodone Hcl] Itching  . Tetracyclines & Related Itching    Her family history includes Cancer in her maternal aunt.  She  reports that she  has never smoked. She does not have any smokeless tobacco history on file. She reports that she drinks alcohol. She reports that she does not use illicit drugs.  Review of Systems  Constitutional: Positive for appetite change and unexpected weight change. Negative for fever.  HENT: Positive for congestion. Negative for dental problem, ear pain, nosebleeds, postnasal drip, rhinorrhea, sinus pressure, sneezing, sore throat and trouble swallowing.   Eyes: Negative for redness and itching.  Respiratory: Positive for cough and shortness of breath. Negative for chest tightness and wheezing.   Cardiovascular: Negative for  palpitations and leg swelling.  Gastrointestinal: Negative for nausea and vomiting.  Genitourinary: Negative for dysuria.  Musculoskeletal: Positive for arthralgias and joint swelling.  Skin: Negative for rash.  Neurological: Negative for headaches.  Hematological: Does not bruise/bleed easily.  Psychiatric/Behavioral: Negative for dysphoric mood. The patient is not nervous/anxious.    Physical Exam:  General - No distress ENT - No sinus tenderness, no oral exudate, no LAN, no thyromegaly, TM clear, pupils equal/reactive, MP 3, high arched palate Cardiac - s1s2 regular, no murmur, pulses symmetric Chest - No wheeze/rales/dullness, good air entry, normal respiratory excursion Back - No focal tenderness Abd - Soft, non-tender, no organomegaly, + bowel sounds Ext - No edema Neuro - Normal strength, cranial nerves intact Skin - No rashes Psych - Normal mood, and behavior  Assessment/plan:  Coralyn Helling, M.D. Pager [phone]

## 2014-03-03 NOTE — Assessment & Plan Note (Signed)
She has snoring, apnea, sleep disruption, and daytime sleepiness.  She has hx of HTN on multiple medications and depression.  I am concerned she could have sleep apnea.  We discussed how sleep apnea can affect various health problems including risks for hypertension, cardiovascular disease, and diabetes.  We also discussed how sleep disruption can increase risks for accident, such as while driving.  Weight loss as a means of improving sleep apnea was also reviewed.  Additional treatment options discussed were CPAP therapy, oral appliance, and surgical intervention.  To further assess will arrange for in lab sleep study.

## 2014-03-03 NOTE — Progress Notes (Deleted)
   Subjective:    Patient ID: Haley SpiresJanet E Singh, female    DOB: 01/27/1951, 63 y.o.   MRN: [id number]  HPI    Review of Systems  Constitutional: Positive for appetite change and unexpected weight change. Negative for fever.  HENT: Positive for congestion. Negative for dental problem, ear pain, nosebleeds, postnasal drip, rhinorrhea, sinus pressure, sneezing, sore throat and trouble swallowing.   Eyes: Negative for redness and itching.  Respiratory: Positive for cough and shortness of breath. Negative for chest tightness and wheezing.   Cardiovascular: Negative for palpitations and leg swelling.  Gastrointestinal: Negative for nausea and vomiting.  Genitourinary: Negative for dysuria.  Musculoskeletal: Positive for arthralgias and joint swelling.  Skin: Negative for rash.  Neurological: Negative for headaches.  Hematological: Does not bruise/bleed easily.  Psychiatric/Behavioral: Negative for dysphoric mood. The patient is not nervous/anxious.        Objective:   Physical Exam        Assessment & Plan:

## 2014-03-03 NOTE — Patient Instructions (Signed)
Will arrange for sleep study Will call to arrange for follow up after sleep study reviewed 

## 2014-05-16 ENCOUNTER — Ambulatory Visit (HOSPITAL_BASED_OUTPATIENT_CLINIC_OR_DEPARTMENT_OTHER): Payer: Medicare Other | Attending: Pulmonary Disease | Admitting: Radiology

## 2014-05-16 VITALS — Ht 66.0 in | Wt 180.0 lb

## 2014-05-16 DIAGNOSIS — G4733 Obstructive sleep apnea (adult) (pediatric): Secondary | ICD-10-CM | POA: Insufficient documentation

## 2014-05-16 DIAGNOSIS — Z7951 Long term (current) use of inhaled steroids: Secondary | ICD-10-CM | POA: Diagnosis not present

## 2014-05-16 DIAGNOSIS — Z79899 Other long term (current) drug therapy: Secondary | ICD-10-CM | POA: Insufficient documentation

## 2014-05-16 DIAGNOSIS — R0683 Snoring: Secondary | ICD-10-CM | POA: Insufficient documentation

## 2014-05-16 DIAGNOSIS — Z6829 Body mass index (BMI) 29.0-29.9, adult: Secondary | ICD-10-CM | POA: Insufficient documentation

## 2014-05-16 DIAGNOSIS — G4710 Hypersomnia, unspecified: Secondary | ICD-10-CM | POA: Insufficient documentation

## 2014-05-19 ENCOUNTER — Telehealth: Payer: Self-pay | Admitting: Pulmonary Disease

## 2014-05-19 DIAGNOSIS — G4761 Periodic limb movement disorder: Secondary | ICD-10-CM

## 2014-05-19 DIAGNOSIS — G4733 Obstructive sleep apnea (adult) (pediatric): Secondary | ICD-10-CM

## 2014-05-19 DIAGNOSIS — D649 Anemia, unspecified: Secondary | ICD-10-CM

## 2014-05-19 NOTE — Telephone Encounter (Signed)
PSG 05/16/14 >> AHI 0, SpO2 low 88%, PLMI 28.3  Will have my nurse inform patient that sleep study did not show sleep apnea.  She did have periodic limb movements of sleep.  This can sometimes be associated with restless leg syndrome which can contribute to sleep disruption.  She will need to have lab work done (ferritin, CBC, CMET) to further assess whether she needs therapy for this, and schedule ROV to discuss in more detail >> ROV can be with me or Tammy Parrett if pt is agreeable.

## 2014-05-19 NOTE — Sleep Study (Signed)
[facility]  NAME: Haley SpiresJanet E Singh DATE OF BIRTH:  12/30/1950 MEDICAL RECORD NUMBER [id number]  LOCATION: [facility]  PHYSICIAN: Coralyn HellingVineet Lynix Bonine, M.D. DATE OF STUDY: 05/16/2014  SLEEP STUDY TYPE: Polysomnogram               REFERRING PHYSICIAN: Coralyn HellingSood, Kamillah Didonato, MD  INDICATION FOR STUDY:  Haley Singh is a 63 y.o. female who presents to the sleep lab for evaluation of hypersomnia with obstructive sleep apnea.  She reports snoring, sleep disruption, apnea, and daytime sleepiness.  She has history of hypertension on multiple medications.  EPWORTH SLEEPINESS SCORE: 7. HEIGHT: 5\' 6"  (167.6 cm)  WEIGHT: 180 lb (81.647 kg)    Body mass index is 29.07 kg/(m^2).  NECK SIZE: 14 in.  MEDICATIONS:  Current Outpatient Prescriptions on File Prior to Visit  Medication Sig Dispense Refill  . albuterol (PROVENTIL HFA;VENTOLIN HFA) 108 (90 BASE) MCG/ACT inhaler Inhale 1 puff into the lungs every 6 (six) hours as needed for wheezing or shortness of breath.    Marland Kitchen. amLODipine-benazepril (LOTREL) 5-10 MG per capsule Take 1 capsule by mouth every morning.    Marland Kitchen. atorvastatin (LIPITOR) 40 MG tablet Take 40 mg by mouth daily.    . beclomethasone (QVAR) 80 MCG/ACT inhaler Inhale 1 puff into the lungs daily.    . celecoxib (CELEBREX) 200 MG capsule Take 200 mg by mouth daily.     . cetirizine (ZYRTEC) 10 MG tablet Take 10 mg by mouth daily.    . cyclobenzaprine (FLEXERIL) 10 MG tablet Take 10 mg by mouth 3 (three) times daily as needed for muscle spasms.    . montelukast (SINGULAIR) 10 MG tablet Take 10 mg by mouth daily.    . traZODone (DESYREL) 100 MG tablet Take 100 mg by mouth at bedtime.    Marland Kitchen. venlafaxine XR (EFFEXOR-XR) 150 MG 24 hr capsule Take 300 mg by mouth every morning.      No current facility-administered medications on file prior to visit.    SLEEP ARCHITECTURE:  Total recording time: 432 minutes.  Total sleep time was: 347.5 minutes.  Sleep efficiency:  80.4%.  Sleep latency: 33 minutes.  REM latency: 270 minutes.  Stage N1: 8.1%.  Stage N2: 72.5%.  Stage N3: 0.4%.  Stage R:  19%.  Supine sleep: 0 minutes.  Non-supine sleep: 347.5 minutes.  CARDIAC DATA:  Average heart rate: 61 beats per minute. Rhythm strip: sinus rhythm with occasional PACs.  RESPIRATORY DATA: Average respiratory rate: 14. Snoring: mild. Average AHI: 0.   Apnea index: 0.  Hypopnea index: 0. Obstructive apnea index: 0.  Central apnea index: 0.  Mixed apnea index: 0. REM AHI: 0.  NREM AHI: 0. Supine AHI: 0. Non-supine AHI: 0.  MOVEMENT/PARASOMNIA:  Periodic limb movement: 28.3.  Period limb movements with arousals: 0.5. Restroom trips: one.  OXYGEN DATA:  Baseline oxygenation: 97%. Lowest SaO2: 88%. Time spent below SaO2 90%: 1.1 minutes. Supplemental oxygen used: none.  IMPRESSION/ RECOMMENDATION:   This study did not show evidence for sleep disordered breathing.  She had an increase in her periodic limb movement index.  Correlate clinically whether additional therapy is needed for this.   Coralyn HellingVineet Janila Arrazola, M.D. Diplomate, Biomedical engineerAmerican Board of Sleep Medicine  ELECTRONICALLY SIGNED ON:  05/19/2014, 7:43 AM [facility] PH: [phone]   FX: [phone] ACCREDITED BY THE AMERICAN ACADEMY OF SLEEP MEDICINE

## 2014-05-19 NOTE — Telephone Encounter (Signed)
Dr Craige CottaSood please advise on labs, there are no diagnosis codes to cover these labs (pt is Medicare) -- RLS and PLM is not covered. Patient would have to pay 100% out of pocket for these labs.  ----- LM for pt to return call to discuss below. Pt needs to make OV with Dr. Winifred OliveSood/T. Parrett to discuss sleep study results.

## 2014-05-20 NOTE — Telephone Encounter (Signed)
Enter code for anemia for lab tests.

## 2014-05-20 NOTE — Telephone Encounter (Signed)
[phone] returning call

## 2014-05-20 NOTE — Telephone Encounter (Signed)
Labs ordered.  Awaiting pt call back.

## 2014-05-21 ENCOUNTER — Telehealth: Payer: Self-pay | Admitting: Pulmonary Disease

## 2014-05-21 NOTE — Telephone Encounter (Signed)
Spoke with Lauren at Dr. Trevor MacePower's office, verified that they only need the sleep study.  This has been faxed.  Nothing further needed.

## 2014-05-21 NOTE — Telephone Encounter (Signed)
Results have been explained to patient, pt expressed understanding. Pt scheduled for appt with TP 05/24/14 at 3:30. Pt aware to arrive early for labs. Nothing further needed.

## 2014-05-24 ENCOUNTER — Ambulatory Visit (INDEPENDENT_AMBULATORY_CARE_PROVIDER_SITE_OTHER): Payer: Medicare Other | Admitting: Adult Health

## 2014-05-24 ENCOUNTER — Other Ambulatory Visit (INDEPENDENT_AMBULATORY_CARE_PROVIDER_SITE_OTHER): Payer: Medicare Other

## 2014-05-24 ENCOUNTER — Encounter: Payer: Self-pay | Admitting: Adult Health

## 2014-05-24 VITALS — BP 112/64 | HR 71 | Temp 98.1°F | Ht 66.0 in

## 2014-05-24 DIAGNOSIS — G4733 Obstructive sleep apnea (adult) (pediatric): Secondary | ICD-10-CM

## 2014-05-24 DIAGNOSIS — D649 Anemia, unspecified: Secondary | ICD-10-CM

## 2014-05-24 LAB — CBC WITH DIFFERENTIAL/PLATELET
BASOS ABS: 0 10*3/uL (ref 0.0–0.1)
Basophils Relative: 0.2 % (ref 0.0–3.0)
Eosinophils Absolute: 0 10*3/uL (ref 0.0–0.7)
Eosinophils Relative: 0 % (ref 0.0–5.0)
HCT: 41 % (ref 36.0–46.0)
Hemoglobin: 13.9 g/dL (ref 12.0–15.0)
LYMPHS ABS: 1.4 10*3/uL (ref 0.7–4.0)
LYMPHS PCT: 21.5 % (ref 12.0–46.0)
MCHC: 33.9 g/dL (ref 30.0–36.0)
MCV: 86.2 fl (ref 78.0–100.0)
MONOS PCT: 7.1 % (ref 3.0–12.0)
Monocytes Absolute: 0.5 10*3/uL (ref 0.1–1.0)
NEUTROS PCT: 71.2 % (ref 43.0–77.0)
Neutro Abs: 4.6 10*3/uL (ref 1.4–7.7)
PLATELETS: 238 10*3/uL (ref 150.0–400.0)
RBC: 4.76 Mil/uL (ref 3.87–5.11)
RDW: 13.6 % (ref 11.5–15.5)
WBC: 6.4 10*3/uL (ref 4.0–10.5)

## 2014-05-24 LAB — COMPREHENSIVE METABOLIC PANEL
ALBUMIN: 4.2 g/dL (ref 3.5–5.2)
ALT: 17 U/L (ref 0–35)
AST: 15 U/L (ref 0–37)
Alkaline Phosphatase: 89 U/L (ref 39–117)
BILIRUBIN TOTAL: 0.8 mg/dL (ref 0.2–1.2)
BUN: 15 mg/dL (ref 6–23)
CALCIUM: 8.7 mg/dL (ref 8.4–10.5)
CHLORIDE: 101 meq/L (ref 96–112)
CO2: 30 mEq/L (ref 19–32)
Creatinine, Ser: 1 mg/dL (ref 0.4–1.2)
GFR: 60.39 mL/min (ref >60.00)
GLUCOSE: 75 mg/dL (ref 70–99)
POTASSIUM: 3.7 meq/L (ref 3.5–5.1)
SODIUM: 139 meq/L (ref 135–145)
Total Protein: 7 g/dL (ref 6.0–8.3)

## 2014-05-24 NOTE — Progress Notes (Signed)
   Subjective:    Patient ID: Haley SpiresJanet E Singh, female    DOB: 09/17/1950, 63 y.o.   MRN: [id number]  HPI 63 yo female seen for sleep consult 03/03/14   05/24/2014 Follow up sleep consult  Patient returns to review her sleep study results. Sleep study on December 13 showed no evidence of sleep apnea with an AHI of 0. Patient did have some periodic limb movement with index of  28.3.  She denies any known restless leg symptoms. We discussed labs for anemia workup. She denies any symptoms of known awakening with leg movements. Patient denies any chest pain, orthopnea, PND or leg swelling   Review of Systems Constitutional:   No  weight loss, night sweats,  Fevers, chills, fatigue, or  lassitude.  HEENT:   No headaches,  Difficulty swallowing,  Tooth/dental problems, or  Sore throat,                No sneezing, itching, ear ache, nasal congestion, post nasal drip,   CV:  No chest pain,  Orthopnea, PND, swelling in lower extremities, anasarca, dizziness, palpitations, syncope.   GI  No heartburn, indigestion, abdominal pain, nausea, vomiting, diarrhea, change in bowel habits, loss of appetite, bloody stools.   Resp: No shortness of breath with exertion or at rest.  No excess mucus, no productive cough,  No non-productive cough,  No coughing up of blood.  No change in color of mucus.  No wheezing.  No chest wall deformity  Skin: no rash or lesions.  GU: no dysuria, change in color of urine, no urgency or frequency.  No flank pain, no hematuria   MS:  No joint pain or swelling.  No decreased range of motion.  No back pain.  Psych:  No change in mood or affect. No depression or anxiety.  No memory loss.    '     Objective:   Physical Exam GEN: A/Ox3; pleasant , NAD, well nourished   HEENT:  [place]/AT,  EACs-clear, TMs-wnl, NOSE-clear, THROAT-clear, no lesions, no postnasal drip or exudate noted.   NECK:  Supple w/ fair ROM; no JVD; normal carotid impulses w/o bruits; no thyromegaly or  nodules palpated; no lymphadenopathy.  RESP  Clear  P & A; w/o, wheezes/ rales/ or rhonchi.no accessory muscle use, no dullness to percussion  CARD:  RRR, no m/r/g  , no peripheral edema, pulses intact, no cyanosis or clubbing.  GI:   Soft & nt; nml bowel sounds; no organomegaly or masses detected.  Musco: Warm bil, no deformities or joint swelling noted.   Neuro: alert, no focal deficits noted.    Skin: Warm, no lesions or rashes         Assessment & Plan:

## 2014-05-24 NOTE — Patient Instructions (Signed)
Labs today , we will call with results.  Call back if you have any restless leg symptoms as we discussed  Follow up with our office as needed.

## 2014-05-24 NOTE — Assessment & Plan Note (Signed)
Sleep test did not show any evidence of sleep apnea There was periodic leg movements that could be responsible for disturbed sleep. We'll check labs for possible anemia Patient does not have any restless leg symptoms   Plan  Labs today , we will call with results.  Call back if you have any restless leg symptoms as we discussed  Follow up with our office as needed.

## 2014-05-26 LAB — FERRITIN: FERRITIN: 97 ng/mL (ref 10.0–291.0)

## 2014-05-26 NOTE — Progress Notes (Signed)
Reviewed and agree with assessment/plan. 

## 2014-06-02 ENCOUNTER — Telehealth: Payer: Self-pay | Admitting: Pulmonary Disease

## 2014-06-02 NOTE — Telephone Encounter (Signed)
CMP Latest Ref Rng 05/24/2014  Glucose 70 - 99 mg/dL 75  BUN 6 - 23 mg/dL 15  Creatinine 0.4 - 1.2 mg/dL 1.0  Sodium 161135 - 096145 mEq/L 139  Potassium 3.5 - 5.1 mEq/L 3.7  Chloride 96 - 112 mEq/L 101  CO2 19 - 32 mEq/L 30  Calcium 8.4 - 10.5 mg/dL 8.7  Total Protein 6.0 - 8.3 g/dL 7.0  Total Bilirubin 0.2 - 1.2 mg/dL 0.8  Alkaline Phos 39 - 117 U/L 89  AST 0 - 37 U/L 15  ALT 0 - 35 U/L 17     CBC Latest Ref Rng 05/24/2014  WBC 4.0 - 10.5 K/uL 6.4  Hemoglobin 12.0 - 15.0 g/dL 04.513.9  Hematocrit 40.936.0 - 46.0 % 41.0  Platelets 150.0 - 400.0 K/uL 238.0    Iron/TIBC/Ferritin/ %Sat    Component Value Date/Time   FERRITIN 97.0 05/24/2014 1632    Will have my nurse inform pt that labs were normal.

## 2014-06-03 NOTE — Telephone Encounter (Signed)
LMTCB

## 2014-06-07 NOTE — Telephone Encounter (Signed)
Patient notified of lab results. Nothing further needed.  

## 2014-12-21 ENCOUNTER — Other Ambulatory Visit: Payer: Self-pay | Admitting: Obstetrics and Gynecology

## 2014-12-27 LAB — CYTOLOGY - PAP

## 2016-01-16 LAB — PULMONARY FUNCTION TEST

## 2016-05-17 ENCOUNTER — Ambulatory Visit (INDEPENDENT_AMBULATORY_CARE_PROVIDER_SITE_OTHER): Payer: Medicare Other | Admitting: Pulmonary Disease

## 2016-05-17 ENCOUNTER — Encounter: Payer: Self-pay | Admitting: Pulmonary Disease

## 2016-05-17 ENCOUNTER — Encounter (INDEPENDENT_AMBULATORY_CARE_PROVIDER_SITE_OTHER): Payer: Self-pay

## 2016-05-17 ENCOUNTER — Other Ambulatory Visit (INDEPENDENT_AMBULATORY_CARE_PROVIDER_SITE_OTHER): Payer: Medicare Other

## 2016-05-17 VITALS — BP 124/66 | HR 82 | Ht 65.5 in | Wt 214.4 lb

## 2016-05-17 DIAGNOSIS — J4530 Mild persistent asthma, uncomplicated: Secondary | ICD-10-CM

## 2016-05-17 LAB — NITRIC OXIDE: NITRIC OXIDE: 31

## 2016-05-17 MED ORDER — FLUTICASONE FUROATE-VILANTEROL 100-25 MCG/INH IN AEPB: 1.0000 | INHALATION_SPRAY | Freq: Every day | RESPIRATORY_TRACT | 5 refills | Status: DC

## 2016-05-17 NOTE — Progress Notes (Signed)
Haley SpiresJanet E Singh    [id number]    08/01/1950  Primary Care Physician:Stephanie Nadara ModeJ Powers, MD  Referring Physician: Ihor GullyStephanie J Powers, MD No address on file  Chief complaint:  Consult for evaluation of asthma.  HPI: [name] is 65 year old with past medical history of asthma, hypertension, hyperlipidemia, depression. She has been diagnosed with asthma about 15 years ago. She was initially on Advair which was then switched to Qvar. She has worsening symptoms over the past 6 months. She has daily symptoms of dyspnea, wheezing. She denies any cough, sputum production, chest pain, palpitation. She does not have any nocturnal awakenings. She saw an allergist asked about 15 years ago and was told that she had sensitivity to dust, dogs, cats, weeds.  She used to work with IT support. She does not have any exposures at work or at home. She does not have any mold issues of pets. She is a nonsmoker.  Outpatient Encounter Prescriptions as of 05/17/2016  Medication Sig  . albuterol (PROVENTIL HFA;VENTOLIN HFA) 108 (90 BASE) MCG/ACT inhaler Inhale 1 puff into the lungs every 6 (six) hours as needed for wheezing or shortness of breath.  Marland Kitchen. amLODipine-benazepril (LOTREL) 5-10 MG per capsule Take 1 capsule by mouth every morning.  Marland Kitchen. atorvastatin (LIPITOR) 40 MG tablet Take 40 mg by mouth daily.  . beclomethasone (QVAR) 80 MCG/ACT inhaler Inhale 1 puff into the lungs daily.  . celecoxib (CELEBREX) 200 MG capsule Take 200 mg by mouth daily.   . cetirizine (ZYRTEC) 10 MG tablet Take 10 mg by mouth daily.  . fluticasone (FLONASE) 50 MCG/ACT nasal spray Place 1 spray into both nostrils 2 (two) times daily.  Marland Kitchen. gabapentin (NEURONTIN) 100 MG capsule Take 200 mg by mouth daily.  . montelukast (SINGULAIR) 10 MG tablet Take 10 mg by mouth daily.  . traZODone (DESYREL) 150 MG tablet Take 150 mg by mouth at bedtime.   Marland Kitchen. venlafaxine XR (EFFEXOR-XR) 150 MG 24 hr capsule Take 300 mg by mouth every morning.    . cyclobenzaprine (FLEXERIL) 10 MG tablet Take 10 mg by mouth 3 (three) times daily as needed for muscle spasms.   No facility-administered encounter medications on file as of 05/17/2016.     Allergies as of 05/17/2016 - Review Complete 05/17/2016  Allergen Reaction Noted  . Oxycontin [oxycodone hcl] Itching 10/15/2012  . Tetracyclines & related Itching 10/15/2012    Past Medical History:  Diagnosis Date  . Allergy   . Arthritis   . Asthma   . Chronic kidney disease    left renal stone  . Depression   . Hyperlipidemia   . Hypertension     Past Surgical History:  Procedure Laterality Date  . bowel obstruction  2005  . FRACTURE SURGERY  2007   Fx Rt ankle  . LITHOTRIPSY  2 previous times  . TONSILLECTOMY      Family History  Problem Relation Age of Onset  . Breast cancer Maternal Aunt   . Heart disease Father   . Heart Problems Father     Social History   Social History  . Marital status: Single    Spouse name: N/A  . Number of children: N/A  . Years of education: N/A   Occupational History  . Retired Retired   Social History Main Topics  . Smoking status: Never Smoker  . Smokeless tobacco: Never Used  . Alcohol use Yes     Comment: scotch or wine maybe 1  a night  . Drug use: No  . Sexual activity: Not on file   Other Topics Concern  . Not on file   Social History Narrative   Single, lives alone   No children   OCCUPATION: retired, IT support   Review of systems: Review of Systems  Constitutional: Negative for fever and chills.  HENT: Negative.   Eyes: Negative for blurred vision.  Respiratory: as per HPI  Cardiovascular: Negative for chest pain and palpitations.  Gastrointestinal: Negative for vomiting, diarrhea, blood per rectum. Genitourinary: Negative for dysuria, urgency, frequency and hematuria.  Musculoskeletal: Negative for myalgias, back pain and joint pain.  Skin: Negative for itching and rash.  Neurological: Negative for  dizziness, tremors, focal weakness, seizures and loss of consciousness.  Endo/Heme/Allergies: Negative for environmental allergies.  Psychiatric/Behavioral: Negative for depression, suicidal ideas and hallucinations.  All other systems reviewed and are negative.  Physical Exam: Blood pressure 124/66, pulse 82, height 5' 5.5" (1.664 m), weight 214 lb 6.4 oz (97.3 kg), SpO2 93 %. Gen:      No acute distress HEENT:  EOMI, sclera anicteric Neck:     No masses; no thyromegaly Lungs:    Clear to auscultation bilaterally; normal respiratory effort CV:         Regular rate and rhythm; no murmurs Abd:      + bowel sounds; soft, non-tender; no palpable masses, no distension Ext:    No edema; adequate peripheral perfusion Skin:      Warm and dry; no rash Neuro: alert and oriented x 3 Psych: normal mood and affect  Data Reviewed: Sleep Study 05/19/13- This study did not show evidence for sleep disordered breathing. Chest x-ray 01/21/13-no acute cardiopulmonary abnormality.Images reviewed  Assessment:  Mild persistent asthma with worsening symptoms.   I will stop her qvar and start LABA/Steroid inhaler with Breo. We will check an FENO, CBC with differential. Allergy profile and PFTs for further evaluation of her asthma. She'll return to clinic in 1-2 months for review of her results and response to therapy.  Plan/Recommendations: - Stop qvar - Start breo - Check CBC with diff, blood allergy profile and FENO   Chilton GreathousePraveen Kevork Joyce MD [facility] Pager [phone] 05/17/2016, 12:16 PM  CC: Powers, Shireen QuanStephanie J, MD

## 2016-05-17 NOTE — Patient Instructions (Signed)
We will start you on Breo inhaler. Will check an FENO, CBC with differential, blood allergy profile.  We will schedule you for PFTs.  Return to clinic in 1-2 months.

## 2016-05-18 LAB — RESPIRATORY ALLERGY PROFILE REGION II ~~LOC~~
Allergen, A. alternata, m6: <0.1 kU/L
Allergen, C. Herbarum, M2: <0.1 kU/L
Allergen, Cedar tree, t12: <0.1 kU/L
Allergen, Comm Silver Birch, t9: <0.1 kU/L
Allergen, Cottonwood, t14: <0.1 kU/L
Allergen, Mouse Urine Protein, e78: <0.1 kU/L
Allergen, Mulberry, t76: <0.1 kU/L
Allergen, Oak,t7: <0.1 kU/L
Allergen, P. notatum, m1: <0.1 kU/L
Aspergillus fumigatus, m3: <0.1 kU/L
Box Elder IgE: <0.1 kU/L
Cockroach: <0.1 kU/L
Common Ragweed: 0.23 kU/L — ABNORMAL HIGH
IgE (Immunoglobulin E), Serum: 36 kU/L (ref <115)
Rough Pigweed  IgE: <0.1 kU/L
Sheep Sorrel IgE: <0.1 kU/L

## 2016-05-18 LAB — CBC WITH DIFFERENTIAL/PLATELET
Basophils Absolute: 0 10*3/uL (ref 0.0–0.1)
Basophils Relative: 0.1 % (ref 0.0–3.0)
EOS PCT: 0.1 % (ref 0.0–5.0)
Eosinophils Absolute: 0 10*3/uL (ref 0.0–0.7)
HCT: 43.3 % (ref 36.0–46.0)
Hemoglobin: 15 g/dL (ref 12.0–15.0)
LYMPHS ABS: 0.8 10*3/uL (ref 0.7–4.0)
Lymphocytes Relative: 10.5 % — ABNORMAL LOW (ref 12.0–46.0)
MCHC: 34.7 g/dL (ref 30.0–36.0)
MCV: 86.5 fl (ref 78.0–100.0)
MONO ABS: 0.7 10*3/uL (ref 0.1–1.0)
MONOS PCT: 8.8 % (ref 3.0–12.0)
NEUTROS ABS: 6.4 10*3/uL (ref 1.4–7.7)
NEUTROS PCT: 80.5 % — AB (ref 43.0–77.0)
Platelets: 237 10*3/uL (ref 150.0–400.0)
RBC: 5 Mil/uL (ref 3.87–5.11)
RDW: 14.4 % (ref 11.5–15.5)
WBC: 8 10*3/uL (ref 4.0–10.5)

## 2016-05-23 ENCOUNTER — Encounter: Payer: Self-pay | Admitting: Pulmonary Disease

## 2016-05-31 ENCOUNTER — Encounter: Payer: Self-pay | Admitting: Pulmonary Disease

## 2016-07-05 ENCOUNTER — Other Ambulatory Visit: Payer: Self-pay | Admitting: Pulmonary Disease

## 2016-07-05 DIAGNOSIS — R0600 Dyspnea, unspecified: Secondary | ICD-10-CM

## 2016-07-06 ENCOUNTER — Ambulatory Visit (INDEPENDENT_AMBULATORY_CARE_PROVIDER_SITE_OTHER): Payer: Medicare Other | Admitting: Pulmonary Disease

## 2016-07-06 ENCOUNTER — Encounter: Payer: Self-pay | Admitting: Pulmonary Disease

## 2016-07-06 VITALS — BP 124/56 | HR 80 | Ht 66.0 in | Wt 217.2 lb

## 2016-07-06 DIAGNOSIS — R0600 Dyspnea, unspecified: Secondary | ICD-10-CM

## 2016-07-06 DIAGNOSIS — J45909 Unspecified asthma, uncomplicated: Secondary | ICD-10-CM

## 2016-07-06 DIAGNOSIS — I272 Pulmonary hypertension, unspecified: Secondary | ICD-10-CM

## 2016-07-06 LAB — PULMONARY FUNCTION TEST
DL/VA % PRED: 65 %
DL/VA: 3.29 ml/min/mmHg/L
DLCO COR % PRED: 60 %
DLCO UNC: 17.47 ml/min/mmHg
DLCO cor: 16.38 ml/min/mmHg
DLCO unc % pred: 64 %
FEF 25-75 POST: 3.07 L/s
FEF 25-75 Pre: 2.93 L/sec
FEF2575-%CHANGE-POST: 5 %
FEF2575-%PRED-POST: 145 %
FEF2575-%Pred-Pre: 138 %
FEV1-%Change-Post: 6 %
FEV1-%PRED-POST: 93 %
FEV1-%Pred-Pre: 88 %
FEV1-Post: 2.37 L
FEV1-Pre: 2.22 L
FEV1FVC-%Change-Post: -3 %
FEV1FVC-%PRED-PRE: 115 %
FEV6-%Change-Post: 12 %
FEV6-%Pred-Post: 88 %
FEV6-%Pred-Pre: 78 %
FEV6-Post: 2.81 L
FEV6-Pre: 2.5 L
FEV6FVC-%Pred-Post: 104 %
FEV6FVC-%Pred-Pre: 104 %
FVC-%CHANGE-POST: 10 %
FVC-%PRED-POST: 84 %
FVC-%Pred-Pre: 76 %
FVC-PRE: 2.53 L
FVC-Post: 2.81 L
POST FEV1/FVC RATIO: 84 %
PRE FEV1/FVC RATIO: 88 %
PRE FEV6/FVC RATIO: 100 %
Post FEV6/FVC ratio: 100 %
RV % PRED: 112 %
RV: 2.54 L
TLC % pred: 113 %
TLC: 6.05 L

## 2016-07-06 LAB — NITRIC OXIDE: Nitric Oxide: 23

## 2016-07-06 NOTE — Progress Notes (Addendum)
RAFAEL QUESADA    [id number]    26-Oct-1950  Primary Care Physician:Stephanie Nadara Mode, MD  Referring Physician: Ihor Gully, MD No address on file  Chief complaint:   Follow up for Mild persistent asthma.  HPI: Mrs. Kendrick is 66 year old with past medical history of asthma, hypertension, hyperlipidemia, depression. She has been diagnosed with asthma about 15 years ago. She was initially on Advair which was then switched to Qvar. She has worsening symptoms over the past 6 months. She has daily symptoms of dyspnea, wheezing. She denies any cough, sputum production, chest pain, palpitation. She does not have any nocturnal awakenings. She saw an allergist asked about 15 years ago and was told that she had sensitivity to dust, dogs, cats, weeds.  She used to work with IT support. She does not have any exposures at work or at home. She does not have any mold issues of pets. She is a nonsmoker.  Interim History: She was started on Breo at last visit. She feels that this has helped with her symptoms a lot which she hardly needs to use her rescue inhaler.  Outpatient Encounter Prescriptions as of 07/06/2016  Medication Sig  . albuterol (PROVENTIL HFA;VENTOLIN HFA) 108 (90 BASE) MCG/ACT inhaler Inhale 1 puff into the lungs every 6 (six) hours as needed for wheezing or shortness of breath.  Marland Kitchen amLODipine-benazepril (LOTREL) 5-10 MG per capsule Take 1 capsule by mouth every morning.  Marland Kitchen atorvastatin (LIPITOR) 40 MG tablet Take 40 mg by mouth daily.  . celecoxib (CELEBREX) 200 MG capsule Take 200 mg by mouth daily.   . cetirizine (ZYRTEC) 10 MG tablet Take 10 mg by mouth daily.  . cyclobenzaprine (FLEXERIL) 10 MG tablet Take 10 mg by mouth 3 (three) times daily as needed for muscle spasms.  . fluticasone (FLONASE) 50 MCG/ACT nasal spray Place 1 spray into both nostrils 2 (two) times daily.  . fluticasone furoate-vilanterol (BREO ELLIPTA) 100-25 MCG/INH AEPB Inhale 1 puff into the  lungs daily.  Marland Kitchen gabapentin (NEURONTIN) 100 MG capsule Take 200 mg by mouth daily.  . montelukast (SINGULAIR) 10 MG tablet Take 10 mg by mouth daily.  . traZODone (DESYREL) 150 MG tablet Take 150 mg by mouth at bedtime.   Marland Kitchen venlafaxine XR (EFFEXOR-XR) 150 MG 24 hr capsule Take 300 mg by mouth every morning.   . [DISCONTINUED] beclomethasone (QVAR) 80 MCG/ACT inhaler Inhale 1 puff into the lungs daily.   No facility-administered encounter medications on file as of 07/06/2016.     Allergies as of 07/06/2016 - Review Complete 07/06/2016  Allergen Reaction Noted  . Oxycontin [oxycodone hcl] Itching 10/15/2012  . Tetracyclines & related Itching 10/15/2012    Past Medical History:  Diagnosis Date  . Allergy   . Arthritis   . Asthma   . Chronic kidney disease    left renal stone  . Depression   . Hyperlipidemia   . Hypertension     Past Surgical History:  Procedure Laterality Date  . bowel obstruction  2005  . FRACTURE SURGERY  2007   Fx Rt ankle  . LITHOTRIPSY  2 previous times  . TONSILLECTOMY      Family History  Problem Relation Age of Onset  . Breast cancer Maternal Aunt   . Heart disease Father   . Heart Problems Father     Social History   Social History  . Marital status: Single    Spouse name: N/A  . Number  of children: N/A  . Years of education: N/A   Occupational History  . Retired Retired   Social History Main Topics  . Smoking status: Never Smoker  . Smokeless tobacco: Never Used  . Alcohol use Yes     Comment: scotch or wine maybe 1 a night  . Drug use: No  . Sexual activity: Not on file   Other Topics Concern  . Not on file   Social History Narrative   Single, lives alone   No children   OCCUPATION: retired, IT support   Review of systems: Review of Systems  Constitutional: Negative for fever and chills.  HENT: Negative.   Eyes: Negative for blurred vision.  Respiratory: as per HPI  Cardiovascular: Negative for chest pain and  palpitations.  Gastrointestinal: Negative for vomiting, diarrhea, blood per rectum. Genitourinary: Negative for dysuria, urgency, frequency and hematuria.  Musculoskeletal: Negative for myalgias, back pain and joint pain.  Skin: Negative for itching and rash.  Neurological: Negative for dizziness, tremors, focal weakness, seizures and loss of consciousness.  Endo/Heme/Allergies: Negative for environmental allergies.  Psychiatric/Behavioral: Negative for depression, suicidal ideas and hallucinations.  All other systems reviewed and are negative.  Physical Exam: Blood pressure (!) 124/56, pulse 80, height 5\' 6"  (1.676 m), weight 217 lb 3.2 oz (98.5 kg), SpO2 95 %. Gen:      No acute distress HEENT:  EOMI, sclera anicteric Neck:     No masses; no thyromegaly Lungs:    Clear to auscultation bilaterally; normal respiratory effort CV:         Regular rate and rhythm; no murmurs Abd:      + bowel sounds; soft, non-tender; no palpable masses, no distension Ext:    No edema; adequate peripheral perfusion Skin:      Warm and dry; no rash Neuro: alert and oriented x 3 Psych: normal mood and affect  Data Reviewed: Sleep Study 05/19/13- This study did not show evidence for sleep disordered breathing. Chest x-ray 01/21/13-no acute cardiopulmonary abnormality.Images reviewed  FENO 05/17/16- 31 FENO 2/2/118- 23  PFTs 07/06/16 FVC 2.81 (84%) FEV1 2.37 (93%) F/F 84 TLC 113% DLCO 64% Isolated reduction in diffusion capacity.  CBC with diff 05/17/16- WBC 8, eosinophil percentage 0.1. Absolute basophil count 8 Blood allergy profile 05/17/16- Postive for ragweed allergy. IgE 36  Assessment:  Mild persistent asthma Symptoms improved after Qvar was switched to Breo. FENO is better today PFTs reviewed with her. They show an isolated reduction in diffusion capacity of unclear etiology. I will get an echo to evaluate for pulmonary HTN   Plan/Recommendations: - Continue Breo. - Check echo  Chilton GreathousePraveen  Kaj Vasil MD  Pulmonary and Critical Care Pager [phone] 07/06/2016, 12:27 PM  CC: Ihor GullyPowers, Stephanie J, MD   Addendum: Reviewed fax data from primary care office Chest x-ray 01/18/16-no acute cardiopulmonary abnormality  Spirometry 01/16/16 FVC 2.56 (74%) FEV1 1.88 [71%) F/F 73.3 No obstruction, restriction possible.

## 2016-07-06 NOTE — Patient Instructions (Signed)
Continue the Virgel BouquetBreo Will order an echocardiogram to check heart function and pulmonary hypertension  Return to clinic in 3 months

## 2016-07-20 ENCOUNTER — Ambulatory Visit (HOSPITAL_COMMUNITY): Payer: Medicare Other

## 2016-08-06 ENCOUNTER — Other Ambulatory Visit: Payer: Self-pay

## 2016-08-06 ENCOUNTER — Ambulatory Visit (HOSPITAL_COMMUNITY): Payer: Medicare Other | Attending: Cardiovascular Disease

## 2016-08-06 DIAGNOSIS — I517 Cardiomegaly: Secondary | ICD-10-CM | POA: Insufficient documentation

## 2016-08-06 DIAGNOSIS — I272 Pulmonary hypertension, unspecified: Secondary | ICD-10-CM | POA: Insufficient documentation

## 2016-08-06 DIAGNOSIS — I351 Nonrheumatic aortic (valve) insufficiency: Secondary | ICD-10-CM | POA: Insufficient documentation

## 2016-08-10 ENCOUNTER — Telehealth: Payer: Self-pay | Admitting: Pulmonary Disease

## 2016-08-10 NOTE — Telephone Encounter (Signed)
Notes Recorded by Chilton GreathousePraveen Mannam, MD on 08/08/2016 at 8:28 AM EST Please let the patient know that the echo shows mild changes that are not concerning. There is no evidence of pulmonary hypertension. ------------------------------------ Spoke with pt. She is aware of results. Nothing further was needed.

## 2016-10-22 ENCOUNTER — Ambulatory Visit: Payer: Medicare Other | Admitting: Pulmonary Disease

## 2016-11-05 ENCOUNTER — Ambulatory Visit (INDEPENDENT_AMBULATORY_CARE_PROVIDER_SITE_OTHER): Payer: Medicare Other | Admitting: Pulmonary Disease

## 2016-11-05 ENCOUNTER — Encounter: Payer: Self-pay | Admitting: Pulmonary Disease

## 2016-11-05 VITALS — BP 138/78 | HR 84 | Ht 66.5 in | Wt 203.6 lb

## 2016-11-05 DIAGNOSIS — J4530 Mild persistent asthma, uncomplicated: Secondary | ICD-10-CM | POA: Diagnosis not present

## 2016-11-05 NOTE — Progress Notes (Signed)
Haley SpiresJanet E Singh    [id number]    05/26/1951  Primary Care Physician:Briscoe, Sharrie RothmanKim K, MD  Referring Physician: Ihor GullyPowers, Stephanie J, MD No address on file  Chief complaint:   Follow up for Mild persistent asthma.  HPI: [name] is 66 year old with past medical history of asthma, hypertension, hyperlipidemia, depression. She has been diagnosed with asthma about 15 years ago. She was initially on Advair which was then switched to Qvar. She has worsening symptoms over the past 6 months. She has daily symptoms of dyspnea, wheezing. She denies any cough, sputum production, chest pain, palpitation. She does not have any nocturnal awakenings. She saw an allergist asked about 15 years ago and was told that she had sensitivity to dust, dogs, cats, weeds.  She used to work with IT support. She does not have any exposures at work or at home. She does not have any mold issues of pets. She is a nonsmoker.  Interim History: [facility] as her symptoms were not well controlled. She reports improvement in dyspnea since then. She hardly needs to use her rescue inhaler.  Outpatient Encounter Prescriptions as of 11/05/2016  Medication Sig  . albuterol (PROVENTIL HFA;VENTOLIN HFA) 108 (90 BASE) MCG/ACT inhaler Inhale 1 puff into the lungs every 6 (six) hours as needed for wheezing or shortness of breath.  Marland Kitchen. amLODipine-benazepril (LOTREL) 5-10 MG per capsule Take 1 capsule by mouth every morning.  Marland Kitchen. atorvastatin (LIPITOR) 40 MG tablet Take 40 mg by mouth daily.  . celecoxib (CELEBREX) 200 MG capsule Take 200 mg by mouth daily.   . cetirizine (ZYRTEC) 10 MG tablet Take 10 mg by mouth daily.  . cyclobenzaprine (FLEXERIL) 10 MG tablet Take 10 mg by mouth 3 (three) times daily as needed for muscle spasms.  . fluticasone (FLONASE) 50 MCG/ACT nasal spray Place 1 spray into both nostrils 2 (two) times daily.  . fluticasone furoate-vilanterol (BREO ELLIPTA) 100-25 MCG/INH AEPB  Inhale 1 puff into the lungs daily.  Marland Kitchen. gabapentin (NEURONTIN) 100 MG capsule Take 200 mg by mouth daily.  . montelukast (SINGULAIR) 10 MG tablet Take 10 mg by mouth daily.  . traZODone (DESYREL) 150 MG tablet Take 150 mg by mouth at bedtime.   Marland Kitchen. venlafaxine XR (EFFEXOR-XR) 150 MG 24 hr capsule Take 300 mg by mouth every morning.    No facility-administered encounter medications on file as of 11/05/2016.     Allergies as of 11/05/2016 - Review Complete 11/05/2016  Allergen Reaction Noted  . Oxycontin [oxycodone hcl] Itching 10/15/2012  . Tetracyclines & related Itching 10/15/2012    Past Medical History:  Diagnosis Date  . Allergy   . Arthritis   . Asthma   . Chronic kidney disease    left renal stone  . Depression   . Hyperlipidemia   . Hypertension     Past Surgical History:  Procedure Laterality Date  . bowel obstruction  2005  . FRACTURE SURGERY  2007   Fx Rt ankle  . LITHOTRIPSY  2 previous times  . TONSILLECTOMY      Family History  Problem Relation Age of Onset  . Breast cancer Maternal Aunt   . Heart disease Father   . Heart Problems Father     Social History   Social History  . Marital status: Single    Spouse name: N/A  . Number of children: N/A  . Years of education: N/A   Occupational History  .  Retired Retired   Social History Main Topics  . Smoking status: Never Smoker  . Smokeless tobacco: Never Used  . Alcohol use Yes     Comment: scotch or wine maybe 1 a night  . Drug use: No  . Sexual activity: Not on file   Other Topics Concern  . Not on file   Social History Narrative   Single, lives alone   No children   OCCUPATION: retired, IT support   Review of systems: Review of Systems  Constitutional: Negative for fever and chills.  HENT: Negative.   Eyes: Negative for blurred vision.  Respiratory: as per HPI  Cardiovascular: Negative for chest pain and palpitations.  Gastrointestinal: Negative for vomiting, diarrhea, blood per  rectum. Genitourinary: Negative for dysuria, urgency, frequency and hematuria.  Musculoskeletal: Negative for myalgias, back pain and joint pain.  Skin: Negative for itching and rash.  Neurological: Negative for dizziness, tremors, focal weakness, seizures and loss of consciousness.  Endo/Heme/Allergies: Negative for environmental allergies.  Psychiatric/Behavioral: Negative for depression, suicidal ideas and hallucinations.  All other systems reviewed and are negative.  Physical Exam: Blood pressure 138/78, pulse 84, height 5' 6.5" (1.689 m), weight 203 lb 9.6 oz (92.4 kg), SpO2 95 %. Gen:      No acute distress HEENT:  EOMI, sclera anicteric Neck:     No masses; no thyromegaly Lungs:    Clear to auscultation bilaterally; normal respiratory effort CV:         Regular rate and rhythm; no murmurs Abd:      + bowel sounds; soft, non-tender; no palpable masses, no distension Ext:    No edema; adequate peripheral perfusion Skin:      Warm and dry; no rash Neuro: alert and oriented x 3 Psych: normal mood and affect  Data Reviewed: Sleep Study 05/19/13- This study did not show evidence for sleep disordered breathing. Chest x-ray 01/21/13-no acute cardiopulmonary abnormality.I have reviewed all images personally  FENO 05/17/16- 31 FENO 2/2/118- 23                                                           PFTs 07/06/16 FVC 2.81 (84%) FEV1 2.37 (93%) F/F 84 TLC 113% DLCO 64% Isolated reduction in diffusion capacity.  CBC with diff 05/17/16- WBC 8, eosinophil percentage 0.1. Absolute eos count 8 Blood allergy profile 05/17/16- Postive for ragweed allergy. IgE 36  Reviewed fax data from primary care office Chest x-ray 01/18/16-no acute cardiopulmonary abnormality  Spirometry 01/16/16 FVC 2.56 (74%) FEV1 1.88 [71%) F/F 73.3 No obstruction, restriction possible.  Assessment:  Mild persistent asthma She continues on breo and has not tolerated a change just inhaled steroids in past.  Although there is no evidence of obstruction on PFTs her FENO has been mildly elevated consistent with asthma. PFTs also show an isolated reduction in diffusion capacity but echo does not show pulmonary HTN and there is no evidence of ILD.   Plan/Recommendations: - Chemical engineer.  Return in 6 months  Chilton Greathouse MD [facility] Pager [phone] 11/05/2016, 2:07 PM  CC: Powers, Shireen Quan, MD

## 2016-11-05 NOTE — Patient Instructions (Signed)
Continue using the inhalers as prescribed Return to clinic in 6 months. Please call us if your symptoms worsen.

## 2016-11-07 ENCOUNTER — Other Ambulatory Visit: Payer: Self-pay

## 2016-11-07 MED ORDER — FLUTICASONE FUROATE-VILANTEROL 100-25 MCG/INH IN AEPB: 1.0000 | INHALATION_SPRAY | Freq: Every day | RESPIRATORY_TRACT | 5 refills | Status: DC

## 2016-11-07 NOTE — Telephone Encounter (Signed)
Received refill request for breo 100. Last ov 11/05/16. Rx has seen sent to preferred pharmacy. Nothing further needed.

## 2017-04-15 ENCOUNTER — Other Ambulatory Visit: Payer: Self-pay | Admitting: Urology

## 2017-04-18 ENCOUNTER — Ambulatory Visit (HOSPITAL_COMMUNITY): Admission: RE | Admit: 2017-04-18 | Payer: Medicare Other | Source: Ambulatory Visit | Admitting: Urology

## 2017-04-18 ENCOUNTER — Encounter (HOSPITAL_COMMUNITY): Admission: RE | Payer: Self-pay | Source: Ambulatory Visit

## 2017-04-18 SURGERY — LITHOTRIPSY, ESWL
Anesthesia: LOCAL | Laterality: Left

## 2017-05-10 ENCOUNTER — Ambulatory Visit (INDEPENDENT_AMBULATORY_CARE_PROVIDER_SITE_OTHER): Payer: Medicare Other | Admitting: Pulmonary Disease

## 2017-05-10 ENCOUNTER — Encounter: Payer: Self-pay | Admitting: Pulmonary Disease

## 2017-05-10 VITALS — BP 124/62 | HR 77 | Ht 66.0 in | Wt 195.2 lb

## 2017-05-10 DIAGNOSIS — J4530 Mild persistent asthma, uncomplicated: Secondary | ICD-10-CM

## 2017-05-10 LAB — NITRIC OXIDE: Nitric Oxide: 18

## 2017-05-10 NOTE — Progress Notes (Signed)
Haley Singh    [id number]    20-Jan-1951  Primary Care Physician:Briscoe, Sharrie Rothman, MD  Referring Physician: Macy Mis, MD [address] [place], [place] [address]  Chief complaint:   Follow up for Mild persistent asthma.  HPI: Mrs. Bessler is 66 year old with past medical history of asthma, hypertension, hyperlipidemia, depression. She has been diagnosed with asthma about 15 years ago. She was initially on Advair which was then switched to Qvar. She has worsening symptoms over the past 6 months. She has daily symptoms of dyspnea, wheezing. She denies any cough, sputum production, chest pain, palpitation. She does not have any nocturnal awakenings. She saw an allergist asked about 15 years ago and was told that she had sensitivity to dust, dogs, cats, weeds.  She used to work with IT support. She does not have any exposures at work or at home. She does not have any mold issues of pets. She is a nonsmoker.  Interim History: Qvar was changed to [facility] last year as her symptoms were not well controlled. She continues on the Brio with good control of symptoms.  She uses her albuterol inhaler 1-2 times a week, with no nocturnal awakenings She has some nasal congestion and postnasal drip today.  Outpatient Encounter Medications as of 05/10/2017  Medication Sig  . albuterol (PROVENTIL HFA;VENTOLIN HFA) 108 (90 BASE) MCG/ACT inhaler Inhale 1 puff into the lungs every 6 (six) hours as needed for wheezing or shortness of breath.  Marland Kitchen amLODipine-benazepril (LOTREL) 5-10 MG per capsule Take 1 capsule by mouth every morning.  Marland Kitchen atorvastatin (LIPITOR) 40 MG tablet Take 40 mg by mouth daily.  . celecoxib (CELEBREX) 200 MG capsule Take 400 mg by mouth daily.   . cetirizine (ZYRTEC) 10 MG tablet Take 10 mg by mouth daily.  . cyclobenzaprine (FLEXERIL) 10 MG tablet Take 10 mg by mouth 3 (three) times daily as needed for muscle spasms.  . fluticasone (FLONASE) 50 MCG/ACT nasal  spray Place 1 spray into both nostrils 2 (two) times daily.  . fluticasone furoate-vilanterol (BREO ELLIPTA) 100-25 MCG/INH AEPB Inhale 1 puff into the lungs daily.  Marland Kitchen gabapentin (NEURONTIN) 100 MG capsule Take 200 mg by mouth daily.  . montelukast (SINGULAIR) 10 MG tablet Take 10 mg by mouth daily.  . traZODone (DESYREL) 150 MG tablet Take 150 mg by mouth at bedtime.   Marland Kitchen venlafaxine XR (EFFEXOR-XR) 150 MG 24 hr capsule Take 300 mg by mouth every morning.    No facility-administered encounter medications on file as of 05/10/2017.     Allergies as of 05/10/2017 - Review Complete 05/10/2017  Allergen Reaction Noted  . Oxycontin [oxycodone hcl] Itching 10/15/2012  . Tetracyclines & related Itching 10/15/2012    Past Medical History:  Diagnosis Date  . Allergy   . Arthritis   . Asthma   . Chronic kidney disease    left renal stone  . Depression   . Hyperlipidemia   . Hypertension     Past Surgical History:  Procedure Laterality Date  . bowel obstruction  2005  . FRACTURE SURGERY  2007   Fx Rt ankle  . LITHOTRIPSY  2 previous times  . TONSILLECTOMY      Family History  Problem Relation Age of Onset  . Breast cancer Maternal Aunt   . Heart disease Father   . Heart Problems Father     Social History   Socioeconomic History  . Marital status: Single  Spouse name: Not on file  . Number of children: Not on file  . Years of education: Not on file  . Highest education level: Not on file  Social Needs  . Financial resource strain: Not on file  . Food insecurity - worry: Not on file  . Food insecurity - inability: Not on file  . Transportation needs - medical: Not on file  . Transportation needs - non-medical: Not on file  Occupational History  . Occupation: Retired    Associate Professormployer: RETIRED  Tobacco Use  . Smoking status: Never Smoker  . Smokeless tobacco: Never Used  Substance and Sexual Activity  . Alcohol use: Yes    Comment: scotch or wine maybe 1 a night  . Drug  use: No  . Sexual activity: Not on file  Other Topics Concern  . Not on file  Social History Narrative   Single, lives alone   No children   OCCUPATION: retired, IT support   Review of systems: Review of Systems  Constitutional: Negative for fever and chills.  HENT: Negative.   Eyes: Negative for blurred vision.  Respiratory: as per HPI  Cardiovascular: Negative for chest pain and palpitations.  Gastrointestinal: Negative for vomiting, diarrhea, blood per rectum. Genitourinary: Negative for dysuria, urgency, frequency and hematuria.  Musculoskeletal: Negative for myalgias, back pain and joint pain.  Skin: Negative for itching and rash.  Neurological: Negative for dizziness, tremors, focal weakness, seizures and loss of consciousness.  Endo/Heme/Allergies: Negative for environmental allergies.  Psychiatric/Behavioral: Negative for depression, suicidal ideas and hallucinations.  All other systems reviewed and are negative.  Physical Exam: Blood pressure 124/62, pulse 77, height 5\' 6"  (1.676 m), weight 195 lb 3.2 oz (88.5 kg), SpO2 97 %. Gen:      No acute distress HEENT:  EOMI, sclera anicteric Neck:     No masses; no thyromegaly Lungs:    Clear to auscultation bilaterally; normal respiratory effort CV:         Regular rate and rhythm; no murmurs Abd:      + bowel sounds; soft, non-tender; no palpable masses, no distension Ext:    No edema; adequate peripheral perfusion Skin:      Warm and dry; no rash Neuro: alert and oriented x 3 Psych: normal mood and affect  Data Reviewed: Sleep Study 05/19/13- This study did not show evidence for sleep disordered breathing. Chest x-ray 01/21/13-no acute cardiopulmonary abnormality.I have reviewed all images personally  FENO 05/17/16- 31 FENO 2/2/118- 23   FENO 05/10/17- 18                                                         PFTs 07/06/16 FVC 2.81 (84%), FEV1 2.37 (93%), F/F 84, TLC 113%, DLCO 64% Isolated reduction in diffusion  capacity.  CBC with diff 05/17/16- WBC 8, eosinophil percentage 0.1. Absolute eos count 8 Blood allergy profile 05/17/16- Postive for ragweed allergy. IgE 36  Reviewed fax data from primary care office Chest x-ray 01/18/16-no acute cardiopulmonary abnormality  Spirometry 01/16/16 FVC 2.56 (74%), FEV1 1.88 [71%), F/F 73.3 No obstruction, restriction possible.  Assessment:  Mild persistent asthma She continues on breo and has not tolerated a change just inhaled steroids in past. Although there is no evidence of obstruction on PFTs her FENO has been mildly elevated consistent with asthma. PFTs  also show an isolated reduction in diffusion capacity but echo does not show pulmonary HTN and there is no evidence of ILD.   Allergies, postnasal drip She is using Zyrtec and Flonase over-the-counter.  Plan/Recommendations: - Chemical engineerContinue Breo.  Albuterol as needed - Zyrtec, Flonase.  Return in 6 months  Chilton GreathousePraveen Jesselee Poth MD [facility] Pager [phone] 05/10/2017, 9:00 AM  CC: Macy MisBriscoe, Kim K, MD

## 2017-05-10 NOTE — Patient Instructions (Signed)
Continue using inhalers Continue Zyrtec and Flonase for postnasal drip Follow-up in 6 months.

## 2017-05-17 ENCOUNTER — Other Ambulatory Visit: Payer: Self-pay

## 2017-05-17 MED ORDER — FLUTICASONE FUROATE-VILANTEROL 100-25 MCG/INH IN AEPB: 1.0000 | INHALATION_SPRAY | Freq: Every day | RESPIRATORY_TRACT | 5 refills | Status: DC

## 2017-05-17 NOTE — Telephone Encounter (Signed)
[facility]. Rx has been sent to preferred pharmacy. Nothing further is needed.

## 2017-11-26 ENCOUNTER — Other Ambulatory Visit: Payer: Self-pay | Admitting: Pulmonary Disease

## 2017-11-28 ENCOUNTER — Other Ambulatory Visit: Payer: Self-pay | Admitting: Pulmonary Disease

## 2017-11-28 ENCOUNTER — Ambulatory Visit (INDEPENDENT_AMBULATORY_CARE_PROVIDER_SITE_OTHER): Payer: Medicare Other | Admitting: Pulmonary Disease

## 2017-11-28 ENCOUNTER — Encounter: Payer: Self-pay | Admitting: Pulmonary Disease

## 2017-11-28 VITALS — BP 136/76 | HR 71 | Ht 66.5 in | Wt 197.8 lb

## 2017-11-28 DIAGNOSIS — J45909 Unspecified asthma, uncomplicated: Secondary | ICD-10-CM

## 2017-11-28 LAB — NITRIC OXIDE: Nitric Oxide: 22

## 2017-11-28 NOTE — Patient Instructions (Addendum)
The lungs sound clear today.  We will continue your inhalers without change Continue Breo, albuterol as needed Please check with your primary care if you can come off benazepril which is likely aggravating the cough. Follow-up in 6 months.

## 2017-11-28 NOTE — Progress Notes (Signed)
Haley SpiresJanet E Singh    [id number]    07/17/1950  Primary Care Physician:Briscoe, Sharrie RothmanKim K, MD  Referring Physician: Macy MisBriscoe, Kim K, MD [address] [place], [place] [id number]  Chief complaint:   Follow up for Mild persistent asthma.  HPI: [name] is 67 year old with past medical history of asthma, hypertension, hyperlipidemia, depression. She has been diagnosed with asthma about 15 years ago. She was initially on Advair which was then switched to Qvar.  He was then [facility] as symptoms are not well controlled. She has worsening symptoms over the past 6 months. She has daily symptoms of dyspnea, wheezing. She denies any cough, sputum production, chest pain, palpitation. She does not have any nocturnal awakenings. She saw an allergist asked about 15 years ago and was told that she had sensitivity to dust, dogs, cats, weeds.  She used to work with IT support. She does not have any exposures at work or at home. She does not have any mold issues of pets. She is a nonsmoker.  Interim History: Complains of recurrent cough, nonproductive in nature.  Dyspnea is stable.  She is on Breo and albuterol.  Needs to use rescue inhaler 2-3 times a week.  Outpatient Encounter Medications as of 11/28/2017  Medication Sig  . albuterol (PROVENTIL HFA;VENTOLIN HFA) 108 (90 BASE) MCG/ACT inhaler Inhale 1 puff into the lungs every 6 (six) hours as needed for wheezing or shortness of breath.  Marland Kitchen. amLODipine-benazepril (LOTREL) 5-10 MG per capsule Take 1 capsule by mouth every morning.  Marland Kitchen. atorvastatin (LIPITOR) 40 MG tablet Take 40 mg by mouth daily.  Marland Kitchen. BREO ELLIPTA 100-25 MCG/INH AEPB INHALE ONE DOSE BY MOUTH DAILY  . celecoxib (CELEBREX) 200 MG capsule Take 400 mg by mouth daily.   . cetirizine (ZYRTEC) 10 MG tablet Take 10 mg by mouth daily.  . cyclobenzaprine (FLEXERIL) 10 MG tablet Take 10 mg by mouth 3 (three) times daily as needed for muscle spasms.  . fluticasone  (FLONASE) 50 MCG/ACT nasal spray Place 1 spray into both nostrils 2 (two) times daily.  Marland Kitchen. gabapentin (NEURONTIN) 100 MG capsule Take 200 mg by mouth daily.  . montelukast (SINGULAIR) 10 MG tablet Take 10 mg by mouth daily.  . traZODone (DESYREL) 150 MG tablet Take 150 mg by mouth at bedtime.   Marland Kitchen. venlafaxine XR (EFFEXOR-XR) 150 MG 24 hr capsule Take 300 mg by mouth every morning.    No facility-administered encounter medications on file as of 11/28/2017.     Allergies as of 11/28/2017 - Review Complete 11/28/2017  Allergen Reaction Noted  . Oxycontin [oxycodone hcl] Itching 10/15/2012  . Tetracyclines & related Itching 10/15/2012    Past Medical History:  Diagnosis Date  . Allergy   . Arthritis   . Asthma   . Chronic kidney disease    left renal stone  . Depression   . Hyperlipidemia   . Hypertension     Past Surgical History:  Procedure Laterality Date  . bowel obstruction  2005  . FRACTURE SURGERY  2007   Fx Rt ankle  . LITHOTRIPSY  2 previous times  . TONSILLECTOMY      Family History  Problem Relation Age of Onset  . Breast cancer Maternal Aunt   . Heart disease Father   . Heart Problems Father     Social History   Socioeconomic History  . Marital status: Single    Spouse name: Not on  file  . Number of children: Not on file  . Years of education: Not on file  . Highest education level: Not on file  Occupational History  . Occupation: Retired    Associate Professor: RETIRED  Social Needs  . Financial resource strain: Not on file  . Food insecurity:    Worry: Not on file    Inability: Not on file  . Transportation needs:    Medical: Not on file    Non-medical: Not on file  Tobacco Use  . Smoking status: Never Smoker  . Smokeless tobacco: Never Used  Substance and Sexual Activity  . Alcohol use: Yes    Comment: scotch or wine maybe 1 a night  . Drug use: No  . Sexual activity: Not on file  Lifestyle  . Physical activity:    Days per week: Not on file     Minutes per session: Not on file  . Stress: Not on file  Relationships  . Social connections:    Talks on phone: Not on file    Gets together: Not on file    Attends religious service: Not on file    Active member of club or organization: Not on file    Attends meetings of clubs or organizations: Not on file    Relationship status: Not on file  . Intimate partner violence:    Fear of current or ex partner: Not on file    Emotionally abused: Not on file    Physically abused: Not on file    Forced sexual activity: Not on file  Other Topics Concern  . Not on file  Social History Narrative   Single, lives alone   No children   OCCUPATION: retired, IT support   Review of systems: Review of Systems  Constitutional: Negative for fever and chills.  HENT: Negative.   Eyes: Negative for blurred vision.  Respiratory: as per HPI  Cardiovascular: Negative for chest pain and palpitations.  Gastrointestinal: Negative for vomiting, diarrhea, blood per rectum. Genitourinary: Negative for dysuria, urgency, frequency and hematuria.  Musculoskeletal: Negative for myalgias, back pain and joint pain.  Skin: Negative for itching and rash.  Neurological: Negative for dizziness, tremors, focal weakness, seizures and loss of consciousness.  Endo/Heme/Allergies: Negative for environmental allergies.  Psychiatric/Behavioral: Negative for depression, suicidal ideas and hallucinations.  All other systems reviewed and are negative.  Physical Exam: Blood pressure 136/76, pulse 71, height 5' 6.5" (1.689 m), weight 197 lb 12.8 oz (89.7 kg), SpO2 96 %. Gen:      No acute distress HEENT:  EOMI, sclera anicteric Neck:     No masses; no thyromegaly Lungs:    Clear to auscultation bilaterally; normal respiratory effort CV:         Regular rate and rhythm; no murmurs Abd:      + bowel sounds; soft, non-tender; no palpable masses, no distension Ext:    No edema; adequate peripheral perfusion Skin:      Warm  and dry; no rash Neuro: alert and oriented x 3 Psych: normal mood and affect  Data Reviewed: Sleep Study 05/19/13- This study did not show evidence for sleep disordered breathing. Chest x-ray 01/21/13-no acute cardiopulmonary abnormality.I have reviewed all images personally  FENO 05/17/16- 31 FENO 2/2/118- 23   FENO 05/10/17- 18 FENO 11/28/17-22  PFTs 07/06/16 FVC 2.81 (84%), FEV1 2.37 (93%), F/F 84, TLC 113%, DLCO 64% Isolated reduction in diffusion capacity.  CBC with diff 05/17/16- WBC 8, eosinophil percentage 0.1. Absolute eos count 8 Blood allergy profile 05/17/16- Postive for ragweed allergy. IgE 36  Reviewed fax data from primary care office Chest x-ray 01/18/16-no acute cardiopulmonary abnormality  Spirometry 01/16/16 FVC 2.56 (74%), FEV1 1.88 [71%), F/F 73.3 No obstruction, restriction possible.  Assessment:  Chronic cough Suspect secondary to ACE inhibitor.  She is on benazepril for hypertension Asked her to discuss with primary care if she can come off the medication.  Mild persistent asthma Continues on Breo.  She has not tolerated change to inhaled steroids in the past.  Although there is no evidence of obstruction on her PFTs FENO has been mildly elevated and she has responded to inhalers. If cough is better off benepril then we can try to come off inhalers.   PFTs also show an isolated reduction in diffusion capacity but echo does not show pulmonary HTN and there is no evidence of ILD.   Allergies, postnasal drip Zyrtec, Flonase over-the-counter  Plan/Recommendations: - Continue Breo.  Albuterol as needed - Zyrtec, Flonase. - Check with primary care if she can come off benazepril.  Return in 6 months  Chilton Greathouse MD [facility] 11/28/2017, 2:56 PM  CC: Macy Mis, MD

## 2017-12-25 ENCOUNTER — Other Ambulatory Visit: Payer: Self-pay | Admitting: Pulmonary Disease

## 2018-03-24 ENCOUNTER — Other Ambulatory Visit: Payer: Self-pay | Admitting: Urology

## 2018-03-27 ENCOUNTER — Ambulatory Visit (HOSPITAL_COMMUNITY): Payer: Medicare Other

## 2018-03-27 ENCOUNTER — Ambulatory Visit (HOSPITAL_COMMUNITY)
Admission: RE | Admit: 2018-03-27 | Discharge: 2018-03-27 | Disposition: A | Discharge status: Home / Self Care | Payer: Medicare Other | Source: Ambulatory Visit | Attending: Urology | Admitting: Urology

## 2018-03-27 ENCOUNTER — Encounter (HOSPITAL_COMMUNITY)
Admission: RE | Disposition: A | Discharge status: Home / Self Care | Payer: Self-pay | Source: Ambulatory Visit | Attending: Urology

## 2018-03-27 ENCOUNTER — Encounter (HOSPITAL_COMMUNITY): Payer: Self-pay | Admitting: General Practice

## 2018-03-27 DIAGNOSIS — F329 Major depressive disorder, single episode, unspecified: Secondary | ICD-10-CM | POA: Insufficient documentation

## 2018-03-27 DIAGNOSIS — J45909 Unspecified asthma, uncomplicated: Secondary | ICD-10-CM | POA: Diagnosis not present

## 2018-03-27 DIAGNOSIS — N200 Calculus of kidney: Secondary | ICD-10-CM | POA: Diagnosis present

## 2018-03-27 DIAGNOSIS — Z881 Allergy status to other antibiotic agents status: Secondary | ICD-10-CM | POA: Insufficient documentation

## 2018-03-27 DIAGNOSIS — I10 Essential (primary) hypertension: Secondary | ICD-10-CM | POA: Diagnosis not present

## 2018-03-27 DIAGNOSIS — E7800 Pure hypercholesterolemia, unspecified: Secondary | ICD-10-CM | POA: Insufficient documentation

## 2018-03-27 DIAGNOSIS — Z79899 Other long term (current) drug therapy: Secondary | ICD-10-CM | POA: Insufficient documentation

## 2018-03-27 DIAGNOSIS — Z888 Allergy status to other drugs, medicaments and biological substances status: Secondary | ICD-10-CM | POA: Insufficient documentation

## 2018-03-27 HISTORY — PX: EXTRACORPOREAL SHOCK WAVE LITHOTRIPSY: SHX1557

## 2018-03-27 HISTORY — DX: Personal history of urinary calculi: Z87.442

## 2018-03-27 SURGERY — LITHOTRIPSY, ESWL
Anesthesia: LOCAL | Laterality: Right

## 2018-03-27 MED ORDER — CIPROFLOXACIN HCL 500 MG PO TABS
500.0000 mg | ORAL_TABLET | ORAL | Status: AC
  Administered 2018-03-27: 500 mg via ORAL
  Filled 2018-03-27: qty 1

## 2018-03-27 MED ORDER — DIPHENHYDRAMINE HCL 25 MG PO CAPS
25.0000 mg | ORAL_CAPSULE | ORAL | Status: AC
  Administered 2018-03-27: 25 mg via ORAL
  Filled 2018-03-27: qty 1

## 2018-03-27 MED ORDER — TRAMADOL HCL 50 MG PO TABS: 50.0000 mg | ORAL_TABLET | Freq: Four times a day (QID) | ORAL | Status: DC | PRN

## 2018-03-27 MED ORDER — SODIUM CHLORIDE 0.9 % IV SOLN
INTRAVENOUS | Status: DC
  Administered 2018-03-27: 08:00:00 via INTRAVENOUS

## 2018-03-27 MED ORDER — DIAZEPAM 5 MG PO TABS
10.0000 mg | ORAL_TABLET | ORAL | Status: AC
  Administered 2018-03-27: 10 mg via ORAL
  Filled 2018-03-27: qty 2

## 2018-03-27 NOTE — Discharge Instructions (Signed)
See [facility] discharge instructions in chart.  

## 2018-03-27 NOTE — Op Note (Signed)
See [facility] OP note scanned into chart. Also because of the size, density, location and other factors that cannot be anticipated I feel this will likely be a staged procedure. This fact supersedes any indication in the scanned [facility] operative note to the contrary.  

## 2018-03-27 NOTE — H&P (Signed)
CC: Kidney stones   HPI: Haley Singh is a 67 year old female with a history of kidney stones. She has passed multiple stones and has required multiple ESWLs in the past.   KUB (03/2017)- bilateral renal stones   From a urinary standpoint, she reports a good FOS and feels like she is emptying her bladder well. She has occasional urgency/frequency, but is not bothered by it. Nocturia x 1. Denies interval stone passage, flank pain, UTIs, dysuria or hematuria. Overall, doing well.     ALLERGIES: Flomax CP24 Oxybutynin Chloride TABS Tetracycline HCl CAPS Utira-C TABS    MEDICATIONS: Celebrex 200 mg capsule Oral  Effexor Xr 150 mg capsule, ext release 24 hr Oral  Lipitor 40 mg tablet Oral  Lotrel 10 mg-40 mg capsule Oral  Mirtazapine  Singulair 10 mg tablet Oral  Tramadol Hcl 50 mg tablet 0 Oral Every 6 hours  Zyrtec-D 5 mg-120 mg tablet, extended release 12 hr Oral     Notes: MEDICATIONS VERIFIED. AT    GU PSH: Cystoscopy Insert Stent - 2008 ESWL - 2015, 2009, 2008, 2008      [facility] Notes: Cataract Surgery, Foot Surgery, Colon Surgery   NON-GU PSH: None   GU PMH: Renal calculus - 04/15/2017, - 03/27/2017, - 2017, Nephrolithiasis, - 2017, Kidney stone on left side, - 2015 Urinary Calculus, Unspec - 04/15/2017 Abdominal Pain Unspec, Left flank pain - 2015 Other microscopic hematuria, Microscopic hematuria - 2015 Ureteral calculus, Calculus of left ureter - 2015, Mid Ureteral Stone, - 2014, Ureteral Stone, - 2014 Chronic cystitis (w/o hematuria), Chronic cystitis - 2014 History of urolithiasis, Nephrolithiasis - 2014 Hydroureter, Hydroureter - 2014      PMH Notes:  2010-09-20 09:12:53 - Note: Flank Pain Right   NON-GU PMH: Encounter for general adult medical examination without abnormal findings, Encounter for preventive health examination - 2017 Asthma, Asthma - 2014 Personal history of other diseases of the circulatory system, History of hypertension - 2014 Personal history  of other endocrine, nutritional and metabolic disease, History of hypercholesterolemia - 2014 Personal history of other mental and behavioral disorders, History of depression - 2014    FAMILY HISTORY: Arthritis - Runs In Family, Mother Breast Cancer - Runs In Family Hypertension - Father Prostate Cancer - Father Urologic Disorder - Father   SOCIAL HISTORY: Marital Status: Single Preferred Language: English; Ethnicity: Not Hispanic Or Latino; Race: White Current Smoking Status: Patient has never smoked.  Does drink.  Drinks 3 caffeinated drinks per day.    REVIEW OF SYSTEMS:    GU Review Female:   Patient denies frequent urination, hard to postpone urination, burning /pain with urination, get up at night to urinate, leakage of urine, stream starts and stops, trouble starting your stream, have to strain to urinate, and being pregnant.  Gastrointestinal (Upper):   Patient denies nausea, vomiting, and indigestion/ heartburn.  Gastrointestinal (Lower):   Patient denies diarrhea and constipation.  Constitutional:   Patient denies fever, night sweats, weight loss, and fatigue.  Skin:   Patient denies skin rash/ lesion and itching.  Eyes:   Patient denies blurred vision and double vision.  Ears/ Nose/ Throat:   Patient denies sore throat and sinus problems.  Hematologic/Lymphatic:   Patient denies swollen glands and easy bruising.  Cardiovascular:   Patient denies leg swelling and chest pains.  Respiratory:   Patient denies cough and shortness of breath.  Endocrine:   Patient denies excessive thirst.  Musculoskeletal:   Patient denies back pain and joint pain.  Neurological:   Patient denies headaches and dizziness.  Psychologic:   Patient denies depression and anxiety.   Notes: PT STATES SHE IS DOING WELL. AT     VITAL SIGNS:      03/24/2018 11:03 AM  Weight 200 lb / 90.72 kg  Height 67 in / 170.18 cm  BP 132/84 mmHg  Pulse 69 /min  Temperature 97.9 F / 36.6 C  BMI 31.3 kg/m    MULTI-SYSTEM PHYSICAL EXAMINATION:    Constitutional: Well-nourished. No physical deformities. Normally developed. Good grooming.  Neck: Neck symmetrical, not swollen. Normal tracheal position.  Respiratory: No labored breathing, no use of accessory muscles.   Cardiovascular: Normal temperature, normal extremity pulses, no swelling, no varicosities.  Lymphatic: No enlargement of neck, axillae, groin.  Skin: No paleness, no jaundice, no cyanosis. No lesion, no ulcer, no rash.  Neurologic / Psychiatric: Oriented to time, oriented to place, oriented to person. No depression, no anxiety, no agitation.  Gastrointestinal: No mass, no tenderness, no rigidity, non obese abdomen.  Eyes: Normal conjunctivae. Normal eyelids.  Ears, Nose, Mouth, and Throat: Left ear no scars, no lesions, no masses. Right ear no scars, no lesions, no masses. Nose no scars, no lesions, no masses. Normal hearing. Normal lips.  Musculoskeletal: Normal gait and station of head and neck.     PAST DATA REVIEWED:  Source Of History:  Patient   PROCEDURES:         KUB - [id number]  A single view of the abdomen is obtained.      KUB today shows a 1.8 cm right lower pole renal stone. She also has a 6.6 mm and 10.9 mm left renal stones. There are no calcifications seen along the expected course of either ureter nor within the bladder. No bony abnormalities or abnormal bowel gas patterns.         Urinalysis w/Scope Dipstick Dipstick Cont'd Micro  Color: Yellow Bilirubin: Neg mg/dL WBC/hpf: NS (Not Seen)  Appearance: Clear Ketones: Neg mg/dL RBC/hpf: 0 - 2/hpf  Specific Gravity: 1.015 Blood: 1+ ery/uL Bacteria: NS (Not Seen)  pH: <=5.0 Protein: Neg mg/dL Cystals: NS (Not Seen)  Glucose: Neg mg/dL Urobilinogen: 0.2 mg/dL Casts: NS (Not Seen)    Nitrites: Neg Trichomonas: Not Present    Leukocyte Esterase: Neg leu/uL Mucous: Not Present      Epithelial Cells: 0 - 5/hpf      Yeast: NS (Not Seen)      Sperm: Not Present     ASSESSMENT:      ICD-10 Details  1 GU:   Renal calculus - N20.0 Bilateral, -18 mm right renal stone -6.6 mm and 10.9 mm right renal stones  2   History of urolithiasis - Z87.442 Stable   PLAN:           Orders         Schedule X-Rays: 1 Year - KUB  Return Visit/Planned Activity: 1 Week - Schedule Surgery  Return Visit/Planned Activity: 1 Year - Office Visit, Follow up MD          Document Letter(s):  Created for Patient: Clinical Summary   Created for Delbert Harness, MD         Notes:   -KUB today shows interval enlargement of her right renal stone as well as multiple left renal stones.   We discussed the management of kidney stones. These options include observation, ureteroscopy, shockwave lithotripsy (ESWL) and percutaneous nephrolithotomy (PCNL). We discussed which options are relevant to the patient's stone(s).  We discussed the natural history of kidney stones as well as the complications of untreated stones and the impact on quality of life without treatment as well as with each of the above listed treatments. We also discussed the efficacy of each treatment in its ability to clear the stone burden. With any of these management options I discussed the signs and symptoms of infection and the need for emergent treatment should these be experienced. For each option we discussed the ability of each procedure to clear the patient of their stone burden.   For observation I described the risks which include but are not limited to silent renal damage, life-threatening infection, need for emergent surgery, failure to pass stone and pain.   For ureteroscopy I described the risks which include bleeding, infection, damage to contiguous structures, positioning injury, ureteral stricture, ureteral avulsion, ureteral injury, need for prolonged ureteral stent, inability to perform ureteroscopy, need for an interval procedure, inability to clear stone burden, stent discomfort/pain, heart attack,  stroke, pulmonary embolus and the inherent risks with general anesthesia.   For shockwave lithotripsy I described the risks which include arrhythmia, kidney contusion, kidney hemorrhage, need for transfusion, pain, inability to adequately break up stone, inability to pass stone fragments, Steinstrasse, infection associated with obstructing stones, need for alternate surgical procedure, need for repeat shockwave lithotripsy, MI, CVA, PE and the inherent risks with anesthesia/conscious sedation.   For PCNL I described the risks including positioning injury, pneumothorax, hydrothorax, need for chest tube, inability to clear stone burden, renal laceration, arterial venous fistula or malformation, need for embolization of kidney, loss of kidney or renal function, need for repeat procedure, need for prolonged nephrostomy tube, ureteral avulsion, MI, CVA, PE and the inherent risks of general anesthesia   -She would like to move forward with RIGHT ESWL.

## 2018-03-28 ENCOUNTER — Encounter (HOSPITAL_COMMUNITY): Payer: Self-pay | Admitting: Urology

## 2018-05-27 ENCOUNTER — Other Ambulatory Visit: Payer: Self-pay | Admitting: Pulmonary Disease

## 2018-07-11 ENCOUNTER — Other Ambulatory Visit: Payer: Self-pay | Admitting: Pulmonary Disease

## 2018-07-17 ENCOUNTER — Encounter: Payer: Self-pay | Admitting: Pulmonary Disease

## 2018-07-17 ENCOUNTER — Ambulatory Visit (INDEPENDENT_AMBULATORY_CARE_PROVIDER_SITE_OTHER): Payer: Medicare Other | Admitting: Pulmonary Disease

## 2018-07-17 VITALS — BP 132/82 | HR 80 | Ht 67.0 in | Wt 207.0 lb

## 2018-07-17 DIAGNOSIS — J45909 Unspecified asthma, uncomplicated: Secondary | ICD-10-CM | POA: Diagnosis not present

## 2018-07-17 MED ORDER — FLUTICASONE FUROATE-VILANTEROL 100-25 MCG/INH IN AEPB: 1.0000 | INHALATION_SPRAY | Freq: Every day | RESPIRATORY_TRACT | 1 refills | Status: DC

## 2018-07-17 MED ORDER — FLUTICASONE FUROATE-VILANTEROL 100-25 MCG/INH IN AEPB: INHALATION_SPRAY | RESPIRATORY_TRACT | 6 refills | Status: DC

## 2018-07-17 NOTE — Progress Notes (Signed)
Herbert SpiresJanet E Alen    [id number]    12/11/1950  Primary Care Physician:Briscoe, Sharrie RothmanKim K, MD  Referring Physician: Macy MisBriscoe, Kim K, MD [address] [place], [place] [address]  Chief complaint:   Follow up for Mild persistent asthma.  HPI: Mrs. Vonzella NippleSeebeck is 68 year old with past medical history of asthma, hypertension, hyperlipidemia, depression. She has been diagnosed with asthma about 15 years ago. She was initially on Advair which was then switched to Qvar.  She was then [facility] 2018 as symptoms are not well controlled.  She has worsening symptoms over the past 6 months. She has daily symptoms of dyspnea, wheezing. She denies any cough, sputum production, chest pain, palpitation. She does not have any nocturnal awakenings. She saw an allergist asked about 15 years ago and was told that she had sensitivity to dust, dogs, cats, weeds.  She used to work with [employer] support. She does not have any exposures at work or at home. She does not have any mold issues of pets. She is a nonsmoker.  Interim History: Taken off Benzapril by her primary care and started on losartan.  States that the cough is improved after the switch  She has not tolerated coming off the HannahBreo.  She was off it for a week when she could not get the prescription refilled and has noticed worsening of breathing While on the Breo breathing is stable.  Hardly needs to use her rescue inhaler.  Outpatient Encounter Medications as of 07/17/2018  Medication Sig  . albuterol (PROVENTIL HFA;VENTOLIN HFA) 108 (90 BASE) MCG/ACT inhaler Inhale 1 puff into the lungs every 6 (six) hours as needed for wheezing or shortness of breath.  Marland Kitchen. amLODipine-benazepril (LOTREL) 5-10 MG per capsule Take 1 capsule by mouth every morning.  Marland Kitchen. atorvastatin (LIPITOR) 40 MG tablet Take 40 mg by mouth daily.  Marland Kitchen. BREO ELLIPTA 100-25 MCG/INH AEPB INHALE 1 PUFF INTO THE LUNGS DAILY AS DIRECTED. **NEED APPOINTMENT FOR FURTHER REFILLS**   . celecoxib (CELEBREX) 200 MG capsule Take 400 mg by mouth daily.   . cetirizine (ZYRTEC) 10 MG tablet Take 10 mg by mouth daily.  . cyclobenzaprine (FLEXERIL) 10 MG tablet Take 10 mg by mouth 3 (three) times daily as needed for muscle spasms.  . fluticasone (FLONASE) 50 MCG/ACT nasal spray Place 1 spray into both nostrils 2 (two) times daily.  Marland Kitchen. gabapentin (NEURONTIN) 100 MG capsule Take 200 mg by mouth daily.  Marland Kitchen. losartan (COZAAR) 50 MG tablet   . montelukast (SINGULAIR) 10 MG tablet Take 10 mg by mouth daily.  . traZODone (DESYREL) 150 MG tablet Take 150 mg by mouth at bedtime.   Marland Kitchen. venlafaxine XR (EFFEXOR-XR) 150 MG 24 hr capsule Take 300 mg by mouth every morning.   Marland Kitchen. BREO ELLIPTA 100-25 MCG/INH AEPB INHALE ONE DOSE BY MOUTH DAILY (Patient not taking: No sig reported)  . BREO ELLIPTA 100-25 MCG/INH AEPB INHALE 1 DOSE INTO THE LUNGS DAILY AS DIRECTED *NEED APPOINTMENT FOR FURTHER REFILLS* (Patient not taking: No sig reported)   No facility-administered encounter medications on file as of 07/17/2018.    Physical Exam: Blood pressure 132/82, pulse 80, height 5\' 7"  (1.702 m), weight 207 lb (93.9 kg), SpO2 96 %. Gen:      No acute distress HEENT:  EOMI, sclera anicteric Neck:     No masses; no thyromegaly Lungs:    Clear to auscultation bilaterally; normal respiratory effort CV:  Regular rate and rhythm; no murmurs Abd:      + bowel sounds; soft, non-tender; no palpable masses, no distension Ext:    No edema; adequate peripheral perfusion Skin:      Warm and dry; no rash Neuro: alert and oriented x 3 Psych: normal mood and affect  Data Reviewed: Sleep Study 05/19/13- This study did not show evidence for sleep disordered breathing. Chest x-ray 01/21/13-no acute cardiopulmonary abnormality.I have reviewed all images personally  FENO 05/17/16- 31 FENO 2/2/118- 23   FENO 05/10/17- 18 FENO 11/28/17-22                                                         PFTs 07/06/16 FVC 2.81  (84%), FEV1 2.37 (93%), F/F 84, TLC 113%, DLCO 64% Isolated reduction in diffusion capacity.  CBC with diff 05/17/16- WBC 8, eosinophil percentage 0.1. Absolute eos count 8 Blood allergy profile 05/17/16- Postive for ragweed allergy. IgE 36  Reviewed fax data from primary care office Chest x-ray 01/18/16-no acute cardiopulmonary abnormality  Spirometry 01/16/16 FVC 2.56 (74%), FEV1 1.88 [71%), F/F 73.3 No obstruction, restriction possible.  Assessment:  Chronic cough, secondary to ACE inhibitor.   Currently off Benzapril and on losartan improvement in symptoms.  Mild persistent asthma She has not tolerated change to inhaled steroids or coming off the Breo.  Although there is no evidence of obstruction on her PFTs FENO has been mildly elevated and she has responded to inhalers. PFTs also show an isolated reduction in diffusion capacity but echo does not show pulmonary HTN and there is no evidence of ILD.   Continue Breo, Singulair, albuterol as needed.  Allergies, postnasal drip Zyrtec, Flonase over-the-counter  Plan/Recommendations: - Continue Breo.  Albuterol as needed - Zyrtec, Flonase.  Return in 6 months  Chilton Greathouse MD [facility] 07/17/2018, 11:03 AM  CC: Macy Mis, MD

## 2018-07-17 NOTE — Patient Instructions (Signed)
I am glad you are doing well with your breathing We will continue the [facility] inhaler.  We will call in prescriptions for those Continue with the Singulair and albuterol as needed Follow-up in 6 months.

## 2018-07-17 NOTE — Addendum Note (Signed)
Addended by: Earvin HansenONNOLLY, Jagjit Riner M on: 07/17/2018 11:46 AM   Modules accepted: Orders

## 2018-07-17 NOTE — Addendum Note (Signed)
Addended by: Earvin Hansen on: 07/17/2018 11:21 AM   Modules accepted: Orders

## 2019-02-25 ENCOUNTER — Other Ambulatory Visit: Payer: Self-pay

## 2019-02-25 DIAGNOSIS — Z20822 Contact with and (suspected) exposure to covid-19: Secondary | ICD-10-CM

## 2019-02-27 LAB — NOVEL CORONAVIRUS, NAA: SARS-CoV-2, NAA: NOT DETECTED

## 2019-03-28 ENCOUNTER — Other Ambulatory Visit: Payer: Self-pay | Admitting: Pulmonary Disease

## 2019-08-12 ENCOUNTER — Other Ambulatory Visit: Payer: Self-pay

## 2019-08-12 ENCOUNTER — Encounter (HOSPITAL_COMMUNITY): Payer: Self-pay | Admitting: Emergency Medicine

## 2019-08-12 ENCOUNTER — Emergency Department (HOSPITAL_COMMUNITY)
Admission: EM | Admit: 2019-08-12 | Discharge: 2019-08-12 | Disposition: A | Discharge status: Home / Self Care | Payer: Medicare PPO | Attending: Emergency Medicine | Admitting: Emergency Medicine

## 2019-08-12 ENCOUNTER — Emergency Department (HOSPITAL_COMMUNITY): Payer: Medicare PPO

## 2019-08-12 DIAGNOSIS — N281 Cyst of kidney, acquired: Secondary | ICD-10-CM

## 2019-08-12 DIAGNOSIS — M5489 Other dorsalgia: Secondary | ICD-10-CM | POA: Diagnosis present

## 2019-08-12 DIAGNOSIS — N200 Calculus of kidney: Secondary | ICD-10-CM

## 2019-08-12 DIAGNOSIS — J45909 Unspecified asthma, uncomplicated: Secondary | ICD-10-CM | POA: Insufficient documentation

## 2019-08-12 DIAGNOSIS — N23 Unspecified renal colic: Secondary | ICD-10-CM

## 2019-08-12 DIAGNOSIS — I10 Essential (primary) hypertension: Secondary | ICD-10-CM | POA: Diagnosis not present

## 2019-08-12 DIAGNOSIS — Z79899 Other long term (current) drug therapy: Secondary | ICD-10-CM | POA: Insufficient documentation

## 2019-08-12 DIAGNOSIS — N202 Calculus of kidney with calculus of ureter: Secondary | ICD-10-CM | POA: Insufficient documentation

## 2019-08-12 LAB — URINALYSIS, ROUTINE W REFLEX MICROSCOPIC
Bacteria, UA: NONE SEEN
Bilirubin Urine: NEGATIVE
Glucose, UA: NEGATIVE mg/dL
Ketones, ur: NEGATIVE mg/dL
Leukocytes,Ua: NEGATIVE
Nitrite: NEGATIVE
Protein, ur: NEGATIVE mg/dL
Specific Gravity, Urine: 1.012 (ref 1.005–1.030)
pH: 6 (ref 5.0–8.0)

## 2019-08-12 LAB — BASIC METABOLIC PANEL
Anion gap: 10 (ref 5–15)
BUN: 17 mg/dL (ref 8–23)
CO2: 20 mmol/L — ABNORMAL LOW (ref 22–32)
Calcium: 9 mg/dL (ref 8.9–10.3)
Chloride: 106 mmol/L (ref 98–111)
Creatinine, Ser: 1.08 mg/dL — ABNORMAL HIGH (ref 0.44–1.00)
GFR calc Af Amer: 60 mL/min — ABNORMAL LOW (ref >60)
GFR calc non Af Amer: 52 mL/min — ABNORMAL LOW (ref >60)
Glucose, Bld: 167 mg/dL — ABNORMAL HIGH (ref 70–99)
Potassium: 3.9 mmol/L (ref 3.5–5.1)
Sodium: 136 mmol/L (ref 135–145)

## 2019-08-12 LAB — CBC
HCT: 43.4 % (ref 36.0–46.0)
Hemoglobin: 14.5 g/dL (ref 12.0–15.0)
MCH: 30.7 pg (ref 26.0–34.0)
MCHC: 33.4 g/dL (ref 30.0–36.0)
MCV: 91.8 fL (ref 80.0–100.0)
Platelets: 201 10*3/uL (ref 150–400)
RBC: 4.73 MIL/uL (ref 3.87–5.11)
RDW: 13.1 % (ref 11.5–15.5)
WBC: 11.9 10*3/uL — ABNORMAL HIGH (ref 4.0–10.5)
nRBC: 0 % (ref 0.0–0.2)

## 2019-08-12 MED ORDER — HYDROCODONE-ACETAMINOPHEN 5-325 MG PO TABS: 1.0000 | ORAL_TABLET | Freq: Four times a day (QID) | ORAL | 0 refills | Status: DC | PRN

## 2019-08-12 MED ORDER — KETOROLAC TROMETHAMINE 30 MG/ML IJ SOLN
30.0000 mg | Freq: Once | INTRAMUSCULAR | Status: AC
  Administered 2019-08-12: 06:00:00 30 mg via INTRAVENOUS
  Filled 2019-08-12: qty 1

## 2019-08-12 MED ORDER — SODIUM CHLORIDE 0.9 % IV BOLUS (SEPSIS)
1000.0000 mL | Freq: Once | INTRAVENOUS | Status: AC
  Administered 2019-08-12: 05:00:00 1000 mL via INTRAVENOUS

## 2019-08-12 MED ORDER — ONDANSETRON HCL 4 MG/2ML IJ SOLN
4.0000 mg | Freq: Once | INTRAMUSCULAR | Status: AC
  Administered 2019-08-12: 4 mg via INTRAVENOUS
  Filled 2019-08-12: qty 2

## 2019-08-12 MED ORDER — ONDANSETRON 8 MG PO TBDP: 8.0000 mg | ORAL_TABLET | Freq: Three times a day (TID) | ORAL | 0 refills | Status: DC | PRN

## 2019-08-12 MED ORDER — FENTANYL CITRATE (PF) 100 MCG/2ML IJ SOLN
100.0000 ug | Freq: Once | INTRAMUSCULAR | Status: AC
  Administered 2019-08-12: 100 ug via INTRAVENOUS
  Filled 2019-08-12: qty 2

## 2019-08-12 NOTE — ED Triage Notes (Signed)
Patient is complaining of right flank pain and right back pain. Patient thinks it is a kidney stone. This started at 8pm tonight.

## 2019-08-12 NOTE — ED Provider Notes (Signed)
[facility] Provider Note   CSN: [id number] Arrival date & time: 08/12/19  0155     History Chief Complaint  Patient presents with   Back Pain   Flank Pain    Haley Singh is a 69 y.o. female.  The history is provided by the patient.  Flank Pain This is a new problem. The current episode started 6 to 12 hours ago. The problem occurs constantly. The problem has been rapidly worsening. Associated symptoms include abdominal pain. Pertinent negatives include no chest pain and no shortness of breath. Exacerbated by: movement/palpation. Nothing relieves the symptoms.  Patient history of asthma, kidney stones presents with acute onset of right flank pain.  She reports associated nausea vomiting.  She reports this feels similar to prior episodes of kidney stones.  She has required lithotripsy in the past.  She had otherwise been feeling well prior to onset of pain     Past Medical History:  Diagnosis Date   Allergy    Arthritis    Asthma    Chronic kidney disease    left renal stone   Depression    History of kidney stones    Hyperlipidemia    Hypertension     Patient Active Problem List   Diagnosis Date Noted   Mild persistent asthma without complication 05/17/2016   OSA (obstructive sleep apnea) 03/03/2014    Past Surgical History:  Procedure Laterality Date   bowel obstruction  2005   EXTRACORPOREAL SHOCK WAVE LITHOTRIPSY Right 03/27/2018   Procedure: EXTRACORPOREAL SHOCK WAVE LITHOTRIPSY (ESWL);  Surgeon: Crist Fat, MD;  Location: [facility];  Service: Urology;  Laterality: Right;   FRACTURE SURGERY  2007   Fx Rt ankle   LITHOTRIPSY  2 previous times   TONSILLECTOMY       OB History   No obstetric history on file.     Family History  Problem Relation Age of Onset   Breast cancer Maternal Aunt    Heart disease Father    Heart Problems Father     Social History   Tobacco Use   Smoking  status: Never Smoker   Smokeless tobacco: Never Used  Substance Use Topics   Alcohol use: Yes    Comment: scotch or wine maybe 1 a night   Drug use: No    Home Medications Prior to Admission medications   Medication Sig Start Date End Date Taking? Authorizing Provider  albuterol (PROVENTIL HFA;VENTOLIN HFA) 108 (90 BASE) MCG/ACT inhaler Inhale 1 puff into the lungs every 6 (six) hours as needed for wheezing or shortness of breath.    [provider]  amLODipine-benazepril (LOTREL) 5-10 MG per capsule Take 1 capsule by mouth every morning.    [provider]  atorvastatin (LIPITOR) 40 MG tablet Take 40 mg by mouth daily.    [provider]  BREO ELLIPTA 100-25 MCG/INH AEPB INHALE ONE DOSE BY MOUTH DAILY Patient not taking: No sig reported 11/28/17   Mannam, Praveen, MD  BREO ELLIPTA 100-25 MCG/INH AEPB INHALE 1 DOSE INTO THE LUNGS DAILY AS DIRECTED *NEED APPOINTMENT FOR FURTHER REFILLS* Patient not taking: No sig reported 12/25/17   Mannam, Praveen, MD  BREO ELLIPTA 100-25 MCG/INH AEPB INHALE 1 PUFF INTO THE LUNGS DAILY AS DIRECTED 04/01/19   Mannam, Praveen, MD  celecoxib (CELEBREX) 200 MG capsule Take 400 mg by mouth daily.     [provider]  cetirizine (ZYRTEC) 10 MG tablet Take 10 mg by mouth daily.  [provider]  cyclobenzaprine (FLEXERIL) 10 MG tablet Take 10 mg by mouth 3 (three) times daily as needed for muscle spasms.    [provider]  fluticasone (FLONASE) 50 MCG/ACT nasal spray Place 1 spray into both nostrils 2 (two) times daily.    [provider]  fluticasone furoate-vilanterol (BREO ELLIPTA) 100-25 MCG/INH AEPB Inhale 1 puff into the lungs daily. 07/17/18   Mannam, Hart Robinsons, MD  gabapentin (NEURONTIN) 100 MG capsule Take 200 mg by mouth daily.    [provider]  losartan (COZAAR) 50 MG tablet  04/25/18   [provider]  montelukast (SINGULAIR) 10 MG tablet Take 10 mg by mouth daily.     [provider]  traZODone (DESYREL) 150 MG tablet Take 150 mg by mouth at bedtime.     [provider]  venlafaxine XR (EFFEXOR-XR) 150 MG 24 hr capsule Take 300 mg by mouth every morning.     [provider]    Allergies    Oxycontin  [oxycodone], Oxycontin [oxycodone hcl], Penicillins, Tetracycline, and Tetracyclines & related  Review of Systems   Review of Systems  Constitutional: Negative for fever.  Respiratory: Negative for shortness of breath.   Cardiovascular: Negative for chest pain.  Gastrointestinal: Positive for abdominal pain.  Genitourinary: Positive for flank pain.  All other systems reviewed and are negative.   Physical Exam Updated Vital Signs BP (!) 143/73 (BP Location: Right Arm)    Pulse 71    Temp 98.6 F (37 C)    Resp 18    Ht 1.689 m (5' 6.5")    Wt 90.7 kg    SpO2 97%    BMI 31.80 kg/m   Physical Exam CONSTITUTIONAL: Well developed/well nourished, uncomfortable appearing HEAD: Normocephalic/atraumatic EYES: EOMI ENMT: mask in place NECK: supple no meningeal signs SPINE/BACK:entire spine nontender CV: S1/S2 noted, no murmurs/rubs/gallops noted LUNGS: Lungs are clear to auscultation bilaterally, no apparent distress ABDOMEN: soft, nontender, no rebound or guarding, bowel sounds noted throughout abdomen OL:MBEML cva tenderness NEURO: Pt is awake/alert/appropriate, moves all extremitiesx4.  No facial droop.   EXTREMITIES:   full ROM SKIN: warm, color normal PSYCH: no abnormalities of mood noted, alert and oriented to situation  ED Results / Procedures / Treatments   Labs (all labs ordered are listed, but only abnormal results are displayed) Labs Reviewed  URINALYSIS, ROUTINE W REFLEX MICROSCOPIC - Abnormal; Notable for the following components:      Result Value   Hgb urine dipstick LARGE (*)    All other components within normal limits  BASIC METABOLIC PANEL - Abnormal; Notable for the following components:   CO2 20  (*)    Glucose, Bld 167 (*)    Creatinine, Ser 1.08 (*)    GFR calc non Af Amer 52 (*)    GFR calc Af Amer 60 (*)    All other components within normal limits  CBC - Abnormal; Notable for the following components:   WBC 11.9 (*)    All other components within normal limits    EKG None  Radiology CT Renal Stone Study  Result Date: 08/12/2019 CLINICAL DATA:  Right flank pain with microhematuria EXAM: CT ABDOMEN AND PELVIS WITHOUT CONTRAST TECHNIQUE: Multidetector CT imaging of the abdomen and pelvis was performed following the standard protocol without IV contrast. COMPARISON:  08/18/2006 FINDINGS: Lower chest:  Coronary atherosclerosis. Hepatobiliary: No focal liver abnormality.Low-density nodular thickening at the fundus of the gallbladder, stable and likely focal adenomyomatosis. Pancreas: Unremarkable. Spleen: Unremarkable.  Adrenals/Urinary Tract: Negative adrenals. Right hydroureteronephrosis related to 3 distal calculi. The largest is most distal and measures 6 mm at the UVJ. More proximally in the pelvis are 2 additional calculi measuring 2-3 mm. Multiple bilateral renal calculi. Over 7 calculi are seen on the left with the largest discrete stone measuring 7 mm. On the right there has been fragmentation and migration of calculi with multiple stones layering in the dilated renal pelvis and individually measuring up to 4 mm. High-density subcentimeter nodule at along the lower pole right kidney, usually proteinaceous or hemorrhagic cyst-interval. No left hydronephrosis. Unremarkable bladder. Stomach/Bowel: No obstruction. Left colonic diverticulosis. Bowel anastomosis in the right abdomen. Vascular/Lymphatic: No acute vascular abnormality. Atherosclerotic calcifications. No mass or adenopathy. Reproductive:No pathologic findings. Other: No ascites or pneumoperitoneum. Musculoskeletal: Multilevel facet osteoarthritis with L4-5 anterolisthesis. Severe L1-2 disc disease with collapse and possible  ankylosis, progressed. 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 IMPRESSION: 1. Right hydroureteronephrosis from 3 distal ureteral calculi, the largest and most distal measuring 6 mm at the UVJ. The 2 additional distal right ureteral calculi measure 2-3 mm. 2. Multiple bilateral renal calculi with progression from 2017. 3. Subcentimeter hyperdensity in the right kidney, usually hemorrhagic or proteinaceous cyst. Recommend attention on follow-up. 4.  Aortic Atherosclerosis (ICD10-I70.0).  Coronary atherosclerosis. 5. Prominent lumbar spine degeneration with progression from 2017. Electronically Signed   By: Marnee Spring M.D.   On: 08/12/2019 05:22    Procedures Procedures  Medications Ordered in ED Medications  fentaNYL (SUBLIMAZE) injection 100 mcg (100 mcg Intravenous Given 08/12/19 0433)  ondansetron (ZOFRAN) injection 4 mg (4 mg Intravenous Given 08/12/19 0433)  sodium chloride 0.9 % bolus 1,000 mL (0 mLs Intravenous Stopped 08/12/19 0610)  ketorolac (TORADOL) 30 MG/ML injection 30 mg (30 mg Intravenous Given 08/12/19 0616)  ondansetron (ZOFRAN) injection 4 mg (4 mg Intravenous Given 08/12/19 8341)    ED Course  I have reviewed the triage vital signs and the nursing notes.  Pertinent labs & imaging results that were available during my care of the patient were reviewed by me and considered in my medical decision making (see chart for details).    MDM Rules/Calculators/A&P                     7:02 AM Patient presented for right flank pain.  CT imaging reveals multiple ureteral stones and a renal cyst. No signs of UTI Patient was improved.  She was requesting discharge home. Pain medicines and antiemetics ordered.  Advise close follow-up with her urologist, she should call today for follow-up as she may require procedure.  Also informed her renal cyst and need for outpatient imaging for this. We discussed strict ER return precautions Final Clinical  Impression(s) / ED Diagnoses Final diagnoses:  Kidney stone  Ureteral colic  Renal cyst    Rx / DC Orders ED Discharge Orders         Ordered    ondansetron (ZOFRAN ODT) 8 MG disintegrating tablet  Every 8 hours PRN     08/12/19 0653    HYDROcodone-acetaminophen (NORCO/VICODIN) 5-325 MG tablet  Every 6 hours PRN     08/12/19 9622           Zadie Rhine, MD 08/12/19 [phone]

## 2019-08-12 NOTE — ED Notes (Signed)
Patient complaining of nausea and vomiting. Patient has pain on right lower back and flank.

## 2019-08-12 NOTE — ED Notes (Addendum)
Pt ambulated independently to bathroom.

## 2019-08-12 NOTE — ED Notes (Signed)
Patient taken to restroom to give urine sample

## 2019-08-12 NOTE — Discharge Instructions (Addendum)
You will need followup testing on the cyst on your right kidney

## 2019-08-12 NOTE — ED Notes (Signed)
Patient drunk water without any difficulty.

## 2019-08-18 ENCOUNTER — Other Ambulatory Visit: Payer: Self-pay | Admitting: Urology

## 2019-08-24 ENCOUNTER — Other Ambulatory Visit: Payer: Self-pay | Admitting: Urology

## 2019-08-24 ENCOUNTER — Encounter (HOSPITAL_BASED_OUTPATIENT_CLINIC_OR_DEPARTMENT_OTHER): Payer: Self-pay | Admitting: Urology

## 2019-08-24 ENCOUNTER — Other Ambulatory Visit (HOSPITAL_COMMUNITY)
Admission: RE | Admit: 2019-08-24 | Discharge: 2019-08-24 | Disposition: A | Discharge status: Home / Self Care | Payer: Medicare PPO | Source: Ambulatory Visit | Attending: Urology | Admitting: Urology

## 2019-08-24 ENCOUNTER — Other Ambulatory Visit: Payer: Self-pay

## 2019-08-24 DIAGNOSIS — J45909 Unspecified asthma, uncomplicated: Secondary | ICD-10-CM | POA: Diagnosis not present

## 2019-08-24 DIAGNOSIS — I700 Atherosclerosis of aorta: Secondary | ICD-10-CM | POA: Diagnosis not present

## 2019-08-24 DIAGNOSIS — Z79899 Other long term (current) drug therapy: Secondary | ICD-10-CM | POA: Diagnosis not present

## 2019-08-24 DIAGNOSIS — Z791 Long term (current) use of non-steroidal anti-inflammatories (NSAID): Secondary | ICD-10-CM | POA: Diagnosis not present

## 2019-08-24 DIAGNOSIS — Z888 Allergy status to other drugs, medicaments and biological substances status: Secondary | ICD-10-CM | POA: Diagnosis not present

## 2019-08-24 DIAGNOSIS — Z8249 Family history of ischemic heart disease and other diseases of the circulatory system: Secondary | ICD-10-CM | POA: Diagnosis not present

## 2019-08-24 DIAGNOSIS — E7800 Pure hypercholesterolemia, unspecified: Secondary | ICD-10-CM | POA: Diagnosis not present

## 2019-08-24 DIAGNOSIS — I10 Essential (primary) hypertension: Secondary | ICD-10-CM | POA: Diagnosis not present

## 2019-08-24 DIAGNOSIS — N132 Hydronephrosis with renal and ureteral calculous obstruction: Secondary | ICD-10-CM | POA: Diagnosis present

## 2019-08-24 DIAGNOSIS — G4730 Sleep apnea, unspecified: Secondary | ICD-10-CM | POA: Diagnosis not present

## 2019-08-24 DIAGNOSIS — Z20822 Contact with and (suspected) exposure to covid-19: Secondary | ICD-10-CM | POA: Insufficient documentation

## 2019-08-24 DIAGNOSIS — N281 Cyst of kidney, acquired: Secondary | ICD-10-CM | POA: Diagnosis not present

## 2019-08-24 DIAGNOSIS — Z01812 Encounter for preprocedural laboratory examination: Secondary | ICD-10-CM | POA: Insufficient documentation

## 2019-08-24 DIAGNOSIS — I2510 Atherosclerotic heart disease of native coronary artery without angina pectoris: Secondary | ICD-10-CM | POA: Diagnosis not present

## 2019-08-24 DIAGNOSIS — Z87442 Personal history of urinary calculi: Secondary | ICD-10-CM | POA: Diagnosis not present

## 2019-08-24 DIAGNOSIS — Z803 Family history of malignant neoplasm of breast: Secondary | ICD-10-CM | POA: Diagnosis not present

## 2019-08-24 DIAGNOSIS — Z8042 Family history of malignant neoplasm of prostate: Secondary | ICD-10-CM | POA: Diagnosis not present

## 2019-08-24 DIAGNOSIS — F329 Major depressive disorder, single episode, unspecified: Secondary | ICD-10-CM | POA: Diagnosis not present

## 2019-08-24 DIAGNOSIS — Z881 Allergy status to other antibiotic agents status: Secondary | ICD-10-CM | POA: Diagnosis not present

## 2019-08-24 DIAGNOSIS — Z841 Family history of disorders of kidney and ureter: Secondary | ICD-10-CM | POA: Diagnosis not present

## 2019-08-24 LAB — SARS CORONAVIRUS 2 (TAT 6-24 HRS): SARS Coronavirus 2: NEGATIVE

## 2019-08-24 NOTE — Progress Notes (Addendum)
Spoke w/ via phone for pre-op interview---patient Lab needs dos---- ekg             Lab results cbc and bmet done 08-12-2019 epic COVID test ------08-24-2019 at 110 pm Arrive at -------700 am 08-26-2019 NPO after ------midnight Medications to take morning of surgery -----albuterol inaler prn/bring inhaler, breo inhaler, singulair, venlafaxine, certrizine, atorvastatin, amlodipine, flonase Diabetic medication -----n/a Patient Special Instructions -----none Pre-Op special Istructions -----none Patient verbalized understanding of instructions that were given at this phone interview. Patient denies shortness of breath, chest pain, fever, cough a this phone interview.

## 2019-08-26 ENCOUNTER — Ambulatory Visit (HOSPITAL_BASED_OUTPATIENT_CLINIC_OR_DEPARTMENT_OTHER)
Admission: RE | Admit: 2019-08-26 | Discharge: 2019-08-26 | Disposition: A | Discharge status: Home / Self Care | Payer: Medicare PPO | Attending: Urology | Admitting: Urology

## 2019-08-26 ENCOUNTER — Ambulatory Visit (HOSPITAL_BASED_OUTPATIENT_CLINIC_OR_DEPARTMENT_OTHER): Payer: Medicare PPO | Admitting: Anesthesiology

## 2019-08-26 ENCOUNTER — Encounter (HOSPITAL_BASED_OUTPATIENT_CLINIC_OR_DEPARTMENT_OTHER)
Admission: RE | Disposition: A | Discharge status: Home / Self Care | Payer: Self-pay | Source: Home / Self Care | Attending: Urology

## 2019-08-26 ENCOUNTER — Encounter (HOSPITAL_BASED_OUTPATIENT_CLINIC_OR_DEPARTMENT_OTHER): Payer: Self-pay | Admitting: Urology

## 2019-08-26 DIAGNOSIS — Z881 Allergy status to other antibiotic agents status: Secondary | ICD-10-CM | POA: Insufficient documentation

## 2019-08-26 DIAGNOSIS — I700 Atherosclerosis of aorta: Secondary | ICD-10-CM | POA: Insufficient documentation

## 2019-08-26 DIAGNOSIS — Z803 Family history of malignant neoplasm of breast: Secondary | ICD-10-CM | POA: Insufficient documentation

## 2019-08-26 DIAGNOSIS — Z8042 Family history of malignant neoplasm of prostate: Secondary | ICD-10-CM | POA: Insufficient documentation

## 2019-08-26 DIAGNOSIS — Z20822 Contact with and (suspected) exposure to covid-19: Secondary | ICD-10-CM | POA: Insufficient documentation

## 2019-08-26 DIAGNOSIS — E7800 Pure hypercholesterolemia, unspecified: Secondary | ICD-10-CM | POA: Insufficient documentation

## 2019-08-26 DIAGNOSIS — N132 Hydronephrosis with renal and ureteral calculous obstruction: Secondary | ICD-10-CM | POA: Insufficient documentation

## 2019-08-26 DIAGNOSIS — I10 Essential (primary) hypertension: Secondary | ICD-10-CM | POA: Insufficient documentation

## 2019-08-26 DIAGNOSIS — Z888 Allergy status to other drugs, medicaments and biological substances status: Secondary | ICD-10-CM | POA: Insufficient documentation

## 2019-08-26 DIAGNOSIS — J45909 Unspecified asthma, uncomplicated: Secondary | ICD-10-CM | POA: Insufficient documentation

## 2019-08-26 DIAGNOSIS — Z79899 Other long term (current) drug therapy: Secondary | ICD-10-CM | POA: Insufficient documentation

## 2019-08-26 DIAGNOSIS — Z841 Family history of disorders of kidney and ureter: Secondary | ICD-10-CM | POA: Insufficient documentation

## 2019-08-26 DIAGNOSIS — Z791 Long term (current) use of non-steroidal anti-inflammatories (NSAID): Secondary | ICD-10-CM | POA: Insufficient documentation

## 2019-08-26 DIAGNOSIS — I2510 Atherosclerotic heart disease of native coronary artery without angina pectoris: Secondary | ICD-10-CM | POA: Insufficient documentation

## 2019-08-26 DIAGNOSIS — Z87442 Personal history of urinary calculi: Secondary | ICD-10-CM | POA: Diagnosis not present

## 2019-08-26 DIAGNOSIS — G4730 Sleep apnea, unspecified: Secondary | ICD-10-CM | POA: Insufficient documentation

## 2019-08-26 DIAGNOSIS — N281 Cyst of kidney, acquired: Secondary | ICD-10-CM | POA: Insufficient documentation

## 2019-08-26 DIAGNOSIS — F329 Major depressive disorder, single episode, unspecified: Secondary | ICD-10-CM | POA: Insufficient documentation

## 2019-08-26 DIAGNOSIS — Z8249 Family history of ischemic heart disease and other diseases of the circulatory system: Secondary | ICD-10-CM | POA: Insufficient documentation

## 2019-08-26 HISTORY — PX: CYSTOSCOPY/URETEROSCOPY/HOLMIUM LASER/STENT PLACEMENT: SHX6546

## 2019-08-26 SURGERY — CYSTOSCOPY/URETEROSCOPY/HOLMIUM LASER/STENT PLACEMENT
Anesthesia: General | Site: Pelvis | Laterality: Right

## 2019-08-26 MED ORDER — MIDAZOLAM HCL 2 MG/2ML IJ SOLN
INTRAMUSCULAR | Status: AC
  Filled 2019-08-26: qty 2

## 2019-08-26 MED ORDER — FENTANYL CITRATE (PF) 100 MCG/2ML IJ SOLN
INTRAMUSCULAR | Status: DC | PRN
  Administered 2019-08-26: 50 ug via INTRAVENOUS

## 2019-08-26 MED ORDER — EPHEDRINE SULFATE-NACL 50-0.9 MG/10ML-% IV SOSY
PREFILLED_SYRINGE | INTRAVENOUS | Status: DC | PRN
  Administered 2019-08-26 (×2): 10 mg via INTRAVENOUS

## 2019-08-26 MED ORDER — OXYCODONE HCL 5 MG PO TABS
5.0000 mg | ORAL_TABLET | Freq: Once | ORAL | Status: DC | PRN
  Filled 2019-08-26: qty 1

## 2019-08-26 MED ORDER — ONDANSETRON HCL 4 MG/2ML IJ SOLN
INTRAMUSCULAR | Status: AC
  Filled 2019-08-26: qty 2

## 2019-08-26 MED ORDER — EPHEDRINE 5 MG/ML INJ
INTRAVENOUS | Status: AC
  Filled 2019-08-26: qty 10

## 2019-08-26 MED ORDER — KETOROLAC TROMETHAMINE 30 MG/ML IJ SOLN
INTRAMUSCULAR | Status: DC | PRN
  Administered 2019-08-26: 30 mg via INTRAVENOUS

## 2019-08-26 MED ORDER — DEXAMETHASONE SODIUM PHOSPHATE 10 MG/ML IJ SOLN
INTRAMUSCULAR | Status: DC | PRN
  Administered 2019-08-26 (×2): 5 mg via INTRAVENOUS

## 2019-08-26 MED ORDER — WHITE PETROLATUM EX OINT
TOPICAL_OINTMENT | CUTANEOUS | Status: AC
  Filled 2019-08-26: qty 5

## 2019-08-26 MED ORDER — ONDANSETRON HCL 4 MG/2ML IJ SOLN
INTRAMUSCULAR | Status: DC | PRN
  Administered 2019-08-26: 4 mg via INTRAVENOUS

## 2019-08-26 MED ORDER — PHENYLEPHRINE HCL (PRESSORS) 10 MG/ML IV SOLN
INTRAVENOUS | Status: AC
  Filled 2019-08-26: qty 2

## 2019-08-26 MED ORDER — PHENAZOPYRIDINE HCL 200 MG PO TABS: 200.0000 mg | ORAL_TABLET | Freq: Three times a day (TID) | ORAL | 0 refills | Status: DC | PRN

## 2019-08-26 MED ORDER — CEPHALEXIN 500 MG PO CAPS: 500.0000 mg | ORAL_CAPSULE | Freq: Two times a day (BID) | ORAL | 0 refills | Status: AC

## 2019-08-26 MED ORDER — CEFAZOLIN SODIUM-DEXTROSE 2-4 GM/100ML-% IV SOLN
2.0000 g | Freq: Once | INTRAVENOUS | Status: AC
  Administered 2019-08-26: 09:00:00 2 g via INTRAVENOUS
  Filled 2019-08-26: qty 100

## 2019-08-26 MED ORDER — ONDANSETRON HCL 4 MG/2ML IJ SOLN
4.0000 mg | Freq: Once | INTRAMUSCULAR | Status: DC | PRN
  Filled 2019-08-26: qty 2

## 2019-08-26 MED ORDER — SODIUM CHLORIDE 0.9 % IR SOLN
Status: DC | PRN
  Administered 2019-08-26: 3000 mL via INTRAVESICAL

## 2019-08-26 MED ORDER — OXYCODONE HCL 5 MG/5ML PO SOLN
5.0000 mg | Freq: Once | ORAL | Status: DC | PRN
  Filled 2019-08-26: qty 5

## 2019-08-26 MED ORDER — PROPOFOL 10 MG/ML IV BOLUS
INTRAVENOUS | Status: DC | PRN
  Administered 2019-08-26: 150 mg via INTRAVENOUS

## 2019-08-26 MED ORDER — IOHEXOL 300 MG/ML  SOLN
INTRAMUSCULAR | Status: DC | PRN
  Administered 2019-08-26: 20 mL via URETHRAL

## 2019-08-26 MED ORDER — DEXAMETHASONE SODIUM PHOSPHATE 10 MG/ML IJ SOLN
INTRAMUSCULAR | Status: AC
  Filled 2019-08-26: qty 1

## 2019-08-26 MED ORDER — FENTANYL CITRATE (PF) 100 MCG/2ML IJ SOLN
INTRAMUSCULAR | Status: AC
  Filled 2019-08-26: qty 2

## 2019-08-26 MED ORDER — FENTANYL CITRATE (PF) 100 MCG/2ML IJ SOLN
25.0000 ug | INTRAMUSCULAR | Status: DC | PRN
  Filled 2019-08-26: qty 1

## 2019-08-26 MED ORDER — LACTATED RINGERS IV SOLN
INTRAVENOUS | Status: DC
  Filled 2019-08-26: qty 1000

## 2019-08-26 MED ORDER — PHENYLEPHRINE HCL-NACL 20-0.9 MG/250ML-% IV SOLN
INTRAVENOUS | Status: DC | PRN
  Administered 2019-08-26: 20 ug/min via INTRAVENOUS

## 2019-08-26 MED ORDER — CEFAZOLIN SODIUM-DEXTROSE 2-4 GM/100ML-% IV SOLN
INTRAVENOUS | Status: AC
  Filled 2019-08-26: qty 100

## 2019-08-26 MED ORDER — KETOROLAC TROMETHAMINE 30 MG/ML IJ SOLN
INTRAMUSCULAR | Status: AC
  Filled 2019-08-26: qty 1

## 2019-08-26 MED ORDER — LIDOCAINE 2% (20 MG/ML) 5 ML SYRINGE
INTRAMUSCULAR | Status: AC
  Filled 2019-08-26: qty 5

## 2019-08-26 MED ORDER — PHENYLEPHRINE 40 MCG/ML (10ML) SYRINGE FOR IV PUSH (FOR BLOOD PRESSURE SUPPORT)
PREFILLED_SYRINGE | INTRAVENOUS | Status: DC | PRN
  Administered 2019-08-26 (×2): 80 ug via INTRAVENOUS

## 2019-08-26 MED ORDER — PROPOFOL 10 MG/ML IV BOLUS
INTRAVENOUS | Status: AC
  Filled 2019-08-26: qty 40

## 2019-08-26 MED ORDER — LIDOCAINE 2% (20 MG/ML) 5 ML SYRINGE
INTRAMUSCULAR | Status: DC | PRN
  Administered 2019-08-26: 100 mg via INTRAVENOUS

## 2019-08-26 SURGICAL SUPPLY — 26 items
APL SKNCLS STERI-STRIP NONHPOA (GAUZE/BANDAGES/DRESSINGS)
BAG DRAIN URO-CYSTO SKYTR STRL (DRAIN) ×2 IMPLANT
BAG DRN UROCATH (DRAIN) ×1
BASKET STONE 1.7 NGAGE (UROLOGICAL SUPPLIES) IMPLANT
BASKET ZERO TIP NITINOL 2.4FR (BASKET) ×2 IMPLANT
BENZOIN TINCTURE PRP APPL 2/3 (GAUZE/BANDAGES/DRESSINGS) IMPLANT
BSKT STON RTRVL ZERO TP 2.4FR (BASKET) ×1
CATH URET 5FR 28IN OPEN ENDED (CATHETERS) ×1 IMPLANT
CLOTH BEACON ORANGE TIMEOUT ST (SAFETY) ×1 IMPLANT
FIBER LASER FLEXIVA 365 (UROLOGICAL SUPPLIES) IMPLANT
FIBER LASER TRAC TIP (UROLOGICAL SUPPLIES) ×1 IMPLANT
GLOVE BIO SURGEON STRL SZ7.5 (GLOVE) ×2 IMPLANT
GOWN STRL REUS W/TWL XL LVL3 (GOWN DISPOSABLE) ×2 IMPLANT
GUIDEWIRE STR DUAL SENSOR (WIRE) ×1 IMPLANT
GUIDEWIRE ZIPWRE .038 STRAIGHT (WIRE) ×2 IMPLANT
IV NS IRRIG 3000ML ARTHROMATIC (IV SOLUTION) ×2 IMPLANT
KIT TURNOVER CYSTO (KITS) ×2 IMPLANT
MANIFOLD NEPTUNE II (INSTRUMENTS) ×2 IMPLANT
NS IRRIG 500ML POUR BTL (IV SOLUTION) ×2 IMPLANT
PACK CYSTO (CUSTOM PROCEDURE TRAY) ×2 IMPLANT
SHEATH URETERAL 12FRX35CM (MISCELLANEOUS) ×1 IMPLANT
STENT URET 6FRX24 CONTOUR (STENTS) ×1 IMPLANT
STRIP CLOSURE SKIN 1/2X4 (GAUZE/BANDAGES/DRESSINGS) IMPLANT
SYR 10ML LL (SYRINGE) ×2 IMPLANT
TUBE CONNECTING 12X1/4 (SUCTIONS) ×1 IMPLANT
TUBING UROLOGY SET (TUBING) ×2 IMPLANT

## 2019-08-26 NOTE — Op Note (Signed)
Operative Note  Preoperative diagnosis:  1.  Right renal and ureteral calculi  Postoperative diagnosis: 1.  Multiple obstructing right proximal ureteral calculi  Procedure(s): 1.  Cystoscopy with right ureteroscopy, holmium laser lithotripsy and right JJ stent placement  Surgeon: Rhoderick Moody, MD  Assistants:  None  Anesthesia:  General  Complications:  None  EBL: Less than 5 mL  Specimens: 1.  None  Drains/Catheters: 1.  6 French, 24 cm JJ stent without tether  Intraoperative findings:   1. Multiple obstructing proximal right ureteral calculi measuring approximately 5 mm each 2. Right retrograde pyelogram revealed dilation of the proximal aspect of the right ureter with multiple filling defects, consistent with obstructing stones.  The right renal pelvis was poorly opacified due to the obstructing stones.  Ureteroscopy revealed no abnormalities within the right renal pelvis  Indication:  Haley Singh is a 69 y.o. female with a 2-week history of ongoing right-sided flank pain associated with mild nausea.  She had a CT stone study on 08/12/2019 that revealed multiple right ureteral and bilateral renal calculi.  Subsequent renal ultrasound revealed persistent hydronephrosis despite the patient passing at least 1 stone in the interim.  She has been consented for the above procedures, voices understanding and wishes to proceed.  Description of procedure:  After informed consent was obtained, the patient was brought to the operating room and general LMA anesthesia was administered. The patient was then placed in the dorsolithotomy position and prepped and draped in the usual sterile fashion. A timeout was performed. A 23 French rigid cystoscope was then inserted into the urethral meatus and advanced into the bladder under direct vision. A complete bladder survey revealed no intravesical pathology.  A 5 French ureteral catheter was then inserted into the right ureteral orifice  and a retrograde pyelogram was obtained, with the findings listed above.  A Glidewire was then used to intubate the lumen of the ureteral catheter and was advanced up to the right renal pelvis, under fluoroscopic guidance.  The catheter was then removed, leaving the wire in place.  An additional sensor wire was then advanced up the right ureter and into the right renal pelvis, or fluoroscopic guidance.  A 14 French, 35 cm ureteral access sheath was then advanced over the sensor wire and into position within the proximal aspects of the right ureter.    A flexible ureteroscope was then advanced through the access sheath, immediately identifying multiple obstructing proximal ureteral calculi.  A 200 m holmium laser was then used to fracture the stones, which eventually migrated into the renal pelvis.  The stones were situated in a midpole calyx and dusted with the holmium laser.  The flexible ureteroscope and the ureteral access sheath were then removed under direct vision, revealing no ureteral stone burden or ureteral trauma following access sheath placement.  A 6 French, 24 cm JJ stent was then placed over the wire and into good position within the right collecting system, confirming placement via fluoroscopy.  The patient's bladder was drained.  She tolerated the procedure well and was transferred to the postanesthesia in stable condition.  Plan: The patient will follow-up in 1 week for office cystoscopy and stent removal

## 2019-08-26 NOTE — Discharge Instructions (Signed)
[facility] [phone] Post Ureteroscopy With Stent Instructions  Definitions:  Ureter: The duct that transports urine from the kidney to the bladder. Stent:   A plastic hollow tube that is placed into the ureter, from the kidney to the bladder to prevent the ureter from swelling shut.  GENERAL INSTRUCTIONS:  Despite the fact that no skin incisions were used, the area around the ureter and bladder is raw and irritated. The stent is a foreign body which will further irritate the bladder wall. This irritation is manifested by increased frequency of urination, both day and night, and by an increase in the urge to urinate. In some, the urge to urinate is present almost always. Sometimes the urge is strong enough that you may not be able to stop yourself from urinating. The only real cure is to remove the stent and then give time for the bladder wall to heal which can't be done until the danger of the ureter swelling shut has passed, which varies.  You may see some blood in your urine while the stent is in place and a few days afterwards. Do not be alarmed, even if the urine was clear for a while. Get off your feet and drink lots of fluids until clearing occurs. If you start to pass clots or don't improve, call us.  DIET: You may return to your normal diet immediately. Because of the raw surface of your bladder, alcohol, spicy foods, acid type foods and drinks with caffeine may cause irritation or frequency and should be used in moderation. To keep your urine flowing freely and to avoid constipation, drink plenty of fluids during the day ( 8-10 glasses ). Tip: Avoid cranberry juice because it is very acidic.  ACTIVITY: Your physical activity doesn't need to be restricted. However, if you are very active, you may see some blood in your urine. We suggest that you reduce your activity under these circumstances until the bleeding has stopped.  BOWELS: It is important to keep your  bowels regular during the postoperative period. Straining with bowel movements can cause bleeding. A bowel movement every other day is reasonable. Use a mild laxative if needed, such as Milk of Magnesia 2-3 tablespoons, or 2 Dulcolax tablets. Call if you continue to have problems. If you have been taking narcotics for pain, before, during or after your surgery, you may be constipated. Take a laxative if necessary.   MEDICATION: You should resume your pre-surgery medications unless told not to. In addition you will often be given an antibiotic to prevent infection. These should be taken as prescribed until the bottles are finished unless you are having an unusual reaction to one of the drugs.  PROBLEMS YOU SHOULD REPORT TO [place]:  Fevers over 100.5 Fahrenheit.  Heavy bleeding, or clots ( See above notes about blood in urine ).  Inability to urinate.  Drug reactions ( hives, rash, nausea, vomiting, diarrhea ).  Severe burning or pain with urination that is not improving.  FOLLOW-UP: You will need a follow-up appointment to monitor your progress. Call for this appointment at the number listed above. Usually the first appointment will be about three to fourteen days after your surgery.      Post Anesthesia Home Care Instructions  Activity: Get plenty of rest for the remainder of the day. A responsible individual must stay with you for 24 hours following the procedure.  For the next 24 hours, DO NOT: -Drive a car -Advertising copywriter -Drink alcoholic beverages -Take any medication  unless instructed by your physician -Make any legal decisions or sign important papers.  Meals: Start with liquid foods such as gelatin or soup. Progress to regular foods as tolerated. Avoid greasy, spicy, heavy foods. If nausea and/or vomiting occur, drink only clear liquids until the nausea and/or vomiting subsides. Call your physician if vomiting continues.  Special Instructions/Symptoms: Your throat may  feel dry or sore from the anesthesia or the breathing tube placed in your throat during surgery. If this causes discomfort, gargle with warm salt water. The discomfort should disappear within 24 hours.  May take Ibuprofen, Advil, Motrin, or Aleve after 3:15 PM.

## 2019-08-26 NOTE — Transfer of Care (Signed)
Immediate Anesthesia Transfer of Care Note  Patient: Haley Singh  Procedure(s) Performed: Procedure(s) (LRB): CYSTOSCOPY/RETROGRADE/URETEROSCOPY/HOLMIUM LASER/STENT PLACEMENT (Right)  Patient Location: PACU  Anesthesia Type: General  Level of Consciousness: awake, oriented, sedated and patient cooperative  Airway & Oxygen Therapy: Patient Spontanous Breathing and Patient connected to face mask oxygen  Post-op Assessment: Report given to PACU RN and Post -op Vital signs reviewed and stable  Post vital signs: Reviewed and stable  Complications: No apparent anesthesia complications Last Vitals:  Vitals Value Taken Time  BP 120/69 08/26/19 0945  Temp 36.7 C 08/26/19 0915  Pulse 80 08/26/19 0953  Resp 14 08/26/19 0953  SpO2 92 % 08/26/19 0953  Vitals shown include unvalidated device data.  Last Pain:  Vitals:   08/26/19 0915  TempSrc:   PainSc: 0-No pain      Patients Stated Pain Goal: 5 (08/26/19 0720)

## 2019-08-26 NOTE — Anesthesia Procedure Notes (Signed)
Procedure Name: LMA Insertion Date/Time: 08/26/2019 8:31 AM Performed by: Francie Massing, CRNA Pre-anesthesia Checklist: Patient identified, Emergency Drugs available, Suction available and Patient being monitored Patient Re-evaluated:Patient Re-evaluated prior to induction Oxygen Delivery Method: Circle system utilized Preoxygenation: Pre-oxygenation with 100% oxygen Induction Type: IV induction Ventilation: Mask ventilation without difficulty LMA: LMA inserted LMA Size: 4.0 Number of attempts: 1 Airway Equipment and Method: Bite block Placement Confirmation: positive ETCO2 Tube secured with: Tape Dental Injury: Teeth and Oropharynx as per pre-operative assessment

## 2019-08-26 NOTE — Anesthesia Preprocedure Evaluation (Signed)
Anesthesia Evaluation  Patient identified by MRN, date of birth, ID band Patient awake    Reviewed: Allergy & Precautions, NPO status , Patient's Chart, lab work & pertinent test results  History of Anesthesia Complications Negative for: history of anesthetic complications  Airway Mallampati: II  TM Distance: >3 FB Neck ROM: Full    Dental  (+) Teeth Intact   Pulmonary asthma , sleep apnea ,    Pulmonary exam normal        Cardiovascular hypertension, Pt. on medications Normal cardiovascular exam     Neuro/Psych PSYCHIATRIC DISORDERS Depression negative neurological ROS     GI/Hepatic negative GI ROS, Neg liver ROS,   Endo/Other  negative endocrine ROS  Renal/GU Renal disease (right renal calculi and hydronephrosis)  negative genitourinary   Musculoskeletal  (+) Arthritis ,   Abdominal   Peds  Hematology negative hematology ROS (+)   Anesthesia Other Findings  Echo 08/06/16: EF 60-65%, unremarkable valves  Reproductive/Obstetrics                            Anesthesia Physical Anesthesia Plan  ASA: II  Anesthesia Plan: General   Post-op Pain Management:    Induction: Intravenous  PONV Risk Score and Plan: 3 and Ondansetron, Dexamethasone, Midazolam and Treatment may vary due to age or medical condition  Airway Management Planned: LMA  Additional Equipment: None  Intra-op Plan:   Post-operative Plan: Extubation in OR  Informed Consent: I have reviewed the patients History and Physical, chart, labs and discussed the procedure including the risks, benefits and alternatives for the proposed anesthesia with the patient or authorized representative who has indicated his/her understanding and acceptance.     Dental advisory given  Plan Discussed with:   Anesthesia Plan Comments:        Anesthesia Quick Evaluation

## 2019-08-26 NOTE — Anesthesia Postprocedure Evaluation (Signed)
Anesthesia Post Note  Patient: Haley Singh  Procedure(s) Performed: CYSTOSCOPY/RETROGRADE/URETEROSCOPY/HOLMIUM LASER/STENT PLACEMENT (Right Pelvis)     Patient location during evaluation: PACU Anesthesia Type: General Level of consciousness: awake and alert Pain management: pain level controlled Vital Signs Assessment: post-procedure vital signs reviewed and stable Respiratory status: spontaneous breathing, nonlabored ventilation and respiratory function stable Cardiovascular status: blood pressure returned to baseline and stable Postop Assessment: no apparent nausea or vomiting Anesthetic complications: no    Last Vitals:  Vitals:   08/26/19 0945 08/26/19 1000  BP: 120/69 117/60  Pulse: 82 77  Resp: 15 11  Temp:    SpO2: 92% 95%    Last Pain:  Vitals:   08/26/19 1007  TempSrc:   PainSc: 2                  Lucretia Kern

## 2019-08-26 NOTE — H&P (Signed)
CC/HPI: CC: Kidney stones   HPI: Haley Singh is a 69 year old female with a history of kidney stones. She has passed multiple stones and has required multiple ESWLs in the past.   The patient was seen in the emergency department on 08/12/2019 due to right-sided flank pain. CT obtained at that time revealed a cluster of stones at the right UVJ the largest of which measuring approximately 6 mm along with bilateral nonobstructing renal stones. She was also noted to have right-sided hydronephrosis on her CT. She passed a large stone, but was seen back in the office on 08/24/19 with worsening right flank pain and worsening right sided hydronephrosis.      ALLERGIES: Flomax CP24 Oxybutynin Chloride TABS Tetracycline HCl CAPS Utira-C TABS    MEDICATIONS: Celebrex 200 mg capsule Oral  Effexor Xr 150 mg capsule, ext release 24 hr Oral  Lipitor 40 mg tablet Oral  Lotrel 10 mg-40 mg capsule Oral  Mirtazapine  Singulair 10 mg tablet Oral  Tramadol Hcl 50 mg tablet 0 Oral Every 6 hours  Zyrtec-D 5 mg-120 mg tablet, extended release 12 hr Oral     Notes: MEDICATIONS VERIFIED. AT    GU PSH: Cystoscopy Insert Stent - 2008 ESWL, Right - 03/27/2018, 2015, 2009, 2008, 2008       [place] Notes: Cataract Surgery, Foot Surgery, Colon Surgery   NON-GU PSH: None   GU PMH: Renal calculus - 07/14/2018, - 04/10/2018, - 04/15/2017, - 03/27/2017, - 2017, Nephrolithiasis, - 2017, Kidney stone on left side, - 2015 History of urolithiasis (Stable) - 03/24/2018, Nephrolithiasis, - 2014 Urinary Calculus, Unspec - 04/15/2017 Abdominal Pain Unspec, Left flank pain - 2015 Other microscopic hematuria, Microscopic hematuria - 2015 Ureteral calculus, Calculus of left ureter - 2015, Mid Ureteral Stone, - 2014, Ureteral Stone, - 2014 Chronic cystitis (w/o hematuria), Chronic cystitis - 2014 Hydroureter, Hydroureter - 2014      PMH Notes:  2010-09-20 09:12:53 - Note: Flank Pain Right   NON-GU PMH: Encounter for general  adult medical examination without abnormal findings, Encounter for preventive health examination - 2017 Asthma, Asthma - 2014 Personal history of other diseases of the circulatory system, History of hypertension - 2014 Personal history of other endocrine, nutritional and metabolic disease, History of hypercholesterolemia - 2014 Personal history of other mental and behavioral disorders, History of depression - 2014    FAMILY HISTORY: Arthritis - Runs In Family, Mother Breast Cancer - Runs In Family Hypertension - Father Prostate Cancer - Father Urologic Disorder - Father   SOCIAL HISTORY: Marital Status: Single Preferred Language: English; Ethnicity: Not Hispanic Or Latino; Race: White Current Smoking Status: Patient has never smoked.  Does drink.  Drinks 3 caffeinated drinks per day.    REVIEW OF SYSTEMS:    GU Review Female:   Patient denies frequent urination, hard to postpone urination, burning /pain with urination, get up at night to urinate, leakage of urine, stream starts and stops, trouble starting your stream, have to strain to urinate, and being pregnant.  Gastrointestinal (Upper):   Patient reports nausea and vomiting. Patient denies indigestion/ heartburn.  Gastrointestinal (Lower):   Patient reports diarrhea. Patient denies constipation.  Constitutional:   Patient denies fever, night sweats, weight loss, and fatigue.  Skin:   Patient denies skin rash/ lesion and itching.  Eyes:   Patient denies blurred vision and double vision.  Ears/ Nose/ Throat:   Patient denies sore throat and sinus problems.  Hematologic/Lymphatic:   Patient denies swollen glands and easy bruising.  Cardiovascular:   Patient denies leg swelling and chest pains.  Respiratory:   Patient denies cough and shortness of breath.  Endocrine:   Patient denies excessive thirst.  Musculoskeletal:   Patient denies back pain and joint pain.  Neurological:   Patient denies headaches and dizziness.  Psychologic:    Patient denies depression and anxiety.   VITAL SIGNS:      08/13/2019 01:17 PM  Weight 205 lb / 92.99 kg  Height 66 in / 167.64 cm  BP 134/81 mmHg  Heart Rate 79 /min  Temperature 98.0 F / 36.6 C  BMI 33.1 kg/m   MULTI-SYSTEM PHYSICAL EXAMINATION:    Constitutional: Well-nourished. No physical deformities. Normally developed. Good grooming.  Neurologic / Psychiatric: Oriented to time, oriented to place, oriented to person. No depression, no anxiety, no agitation.  Musculoskeletal: Normal gait and station of head and neck.     PAST DATA REVIEWED:  Source Of History:  Patient  Lab Test Review:   BMP  Records Review:   Previous Hospital Records  Urine Test Review:   Urinalysis  X-Ray Review: C.T. Abdomen/Pelvis: Reviewed Films. Reviewed Report. Discussed With Patient.    Notes:                     CLINICAL DATA: Right flank pain with microhematuria     EXAM:  CT ABDOMEN AND PELVIS WITHOUT CONTRAST     TECHNIQUE:  Multidetector CT imaging of the abdomen and pelvis was performed  following the standard protocol without IV contrast.     COMPARISON: 08/18/2006     FINDINGS:  Lower chest: Coronary atherosclerosis.     Hepatobiliary: No focal liver abnormality.Low-density nodular  thickening at the fundus of the gallbladder, stable and likely focal  adenomyomatosis.     Pancreas: Unremarkable.     Spleen: Unremarkable.     Adrenals/Urinary Tract: Negative adrenals. Right  hydroureteronephrosis related to 3 distal calculi. The largest is  most distal and measures 6 mm at the UVJ. More proximally in the  pelvis are 2 additional calculi measuring 2-3 mm. Multiple bilateral  renal calculi. Over 7 calculi are seen on the left with the largest  discrete stone measuring 7 mm. On the right there has been  fragmentation and migration of calculi with multiple stones layering  in the dilated renal pelvis and individually measuring up to 4 mm.  High-density subcentimeter nodule at  along the lower pole right  kidney, usually proteinaceous or hemorrhagic cyst-interval. No left  hydronephrosis. Unremarkable bladder.     Stomach/Bowel: No obstruction. Left colonic diverticulosis. Bowel  anastomosis in the right abdomen.     Vascular/Lymphatic: No acute vascular abnormality. Atherosclerotic  calcifications. No mass or adenopathy.     Reproductive:No pathologic findings.     Other: No ascites or pneumoperitoneum.     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 Severe L1-2 disc disease with collapse and possible  ankylosis, progressed. 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     IMPRESSION:  1. Right hydroureteronephrosis from 3 distal ureteral calculi, the  largest and most distal measuring 6 mm at the UVJ. The 2 additional  distal right ureteral calculi measure 2-3 mm.  2. Multiple bilateral renal calculi with progression from 2017.  3. Subcentimeter hyperdensity in the right kidney, usually  hemorrhagic or proteinaceous cyst. Recommend attention on follow-up.  4. Aortic Atherosclerosis (ICD10-I70.0). Coronary atherosclerosis.  5. Prominent lumbar spine degeneration with progression from  2017.        Electronically Signed  By: Marnee Spring M.D.  On: 08/12/2019 05:22   PROCEDURES:         Renal Ultrasound (Limited) - 84696  Kidney: Right Length: 11.8 cm Depth: 7.5 cm Cortical Width: 1.5 cm Width: 5.0 cm    Right Kidney/Ureter:  Moderate hydro still visualized. Multiple calcifications. Largest 0.93 cm. Stone in renal pelvis measuring 0.66 cm.  Bladder:  PVR 50.20 ml      Patient confirmed No Neulasta OnPro Device.   Persistent right sided hydronephrosis and multiple areas of stone shadowing. No solid parenchymal lesions.          Urinalysis w/Scope Dipstick Dipstick Cont'd Micro  Color: Yellow Bilirubin: Neg mg/dL WBC/hpf: 6 - 29/BMW  Appearance: Clear Ketones: Neg mg/dL  RBC/hpf: 3 - 41/LKG  Specific Gravity: 1.025 Blood: 2+ ery/uL Bacteria: Few (10-25/hpf)  pH: 5.5 Protein: Trace mg/dL Cystals: NS (Not Seen)  Glucose: Neg mg/dL Urobilinogen: 0.2 mg/dL Casts: NS (Not Seen)    Nitrites: Neg Trichomonas: Not Present    Leukocyte Esterase: 2+ leu/uL Mucous: Not Present      Epithelial Cells: 0 - 5/hpf      Yeast: NS (Not Seen)      Sperm: Not Present    ASSESSMENT:      ICD-10 Details  1 GU:   Renal and ureteral calculus - N20.2   2   Ureteral obstruction secondary to calculous - N13.2 Acute, Uncomplicated   PLAN:           Orders Labs Urine Culture  X-Rays: Renal Ultrasound (Limited) - right RUS          Schedule Return Visit/Planned Activity: 2 Weeks - Office Visit, Extender  Return Visit/Planned Activity: ASAP - Schedule Surgery          Document Letter(s):  Created for Patient: Clinical Summary   .The risks, benefits and alternatives of cystoscopy with RIGHT ureteroscopy, laser lithotripsy and ureteral stent placement was discussed the patient.  Risks included, but are not limited to: bleeding, urinary tract infection, ureteral injury/avulsion, ureteral stricture formation, retained stone fragments, the possibility that multiple surgeries may be required to treat the stone(s), MI, stroke, PE and the inherent risks of general anesthesia.  The patient voices understanding and wishes to proceed.

## 2019-09-14 ENCOUNTER — Encounter (HOSPITAL_BASED_OUTPATIENT_CLINIC_OR_DEPARTMENT_OTHER): Payer: Self-pay

## 2019-09-14 ENCOUNTER — Ambulatory Visit (HOSPITAL_BASED_OUTPATIENT_CLINIC_OR_DEPARTMENT_OTHER): Admit: 2019-09-14 | Payer: PRIVATE HEALTH INSURANCE | Admitting: Urology

## 2019-09-14 SURGERY — LITHOTRIPSY, ESWL
Anesthesia: LOCAL | Laterality: Right

## 2019-10-07 ENCOUNTER — Other Ambulatory Visit (HOSPITAL_COMMUNITY): Payer: PRIVATE HEALTH INSURANCE

## 2019-10-10 ENCOUNTER — Other Ambulatory Visit (HOSPITAL_COMMUNITY): Payer: PRIVATE HEALTH INSURANCE

## 2019-10-14 ENCOUNTER — Other Ambulatory Visit: Payer: Self-pay | Admitting: Urology

## 2019-10-15 ENCOUNTER — Other Ambulatory Visit: Payer: Self-pay | Admitting: Urology

## 2019-10-19 ENCOUNTER — Other Ambulatory Visit: Payer: Self-pay | Admitting: Pulmonary Disease

## 2019-10-21 ENCOUNTER — Telehealth: Payer: Self-pay | Admitting: Pulmonary Disease

## 2019-10-21 MED ORDER — BREO ELLIPTA 100-25 MCG/INH IN AEPB: 1.0000 | INHALATION_SPRAY | Freq: Every day | RESPIRATORY_TRACT | 1 refills | Status: DC

## 2019-10-21 NOTE — Telephone Encounter (Signed)
Pt has appt on 12/02/2019 with Dr. Karie Soda with pt and advised rx sent to pharmacy. Nothing further is needed.

## 2019-11-10 ENCOUNTER — Other Ambulatory Visit: Payer: Self-pay

## 2019-11-10 ENCOUNTER — Encounter (HOSPITAL_BASED_OUTPATIENT_CLINIC_OR_DEPARTMENT_OTHER): Payer: Self-pay | Admitting: Urology

## 2019-11-10 NOTE — Progress Notes (Signed)
Spoke w/ via phone for pre-op interview--- PT Lab needs dos----  Istat and EKG            Lab results------ no COVID test ------ 11-13-2019 @ 1230 Arrive at ------- 0800 NPO after ------ MN Medications to take morning of surgery ----- Singulair, Effexor, Norvasc, Zyrtec, Lipitor w/ sips of water and do Breo inhaler Diabetic medication ----- n/a Patient Special Instructions ----- asked to bring rescue inhaler dos Pre-Op special Istructions ----- n/a Patient verbalized understanding of instructions that were given at this phone interview. Patient denies shortness of breath, chest pain, fever, cough a this phone interview.   PCP:  Dr Earnest Bailey (lov 09-09-2019 epic) Pulmonologist:  Dr Belva Crome (lov 07-17-2018 epic, next visit 12-02-2019) Cardiologist : no Chest x-ray :  01-18-2016 care [facility] EKG :  none Echo : 08-06-2016 epic Stress test:  Pt stated never had a stress test Cardiac Cath :   no Sleep Study/ CPAP :  Negative sleep study in epic 05-19-2014 Fasting Blood Sugar :      / Checks Blood Sugar -- times a day:    N/A Blood Thinner/ Instructions /Last Dose: NO ASA / Instructions/ Last Dose :  NO

## 2019-11-13 ENCOUNTER — Other Ambulatory Visit (HOSPITAL_COMMUNITY)
Admission: RE | Admit: 2019-11-13 | Discharge: 2019-11-13 | Disposition: A | Discharge status: Home / Self Care | Payer: Medicare PPO | Source: Ambulatory Visit | Attending: Urology | Admitting: Urology

## 2019-11-13 DIAGNOSIS — Z01812 Encounter for preprocedural laboratory examination: Secondary | ICD-10-CM | POA: Insufficient documentation

## 2019-11-13 DIAGNOSIS — Z20822 Contact with and (suspected) exposure to covid-19: Secondary | ICD-10-CM | POA: Diagnosis not present

## 2019-11-13 LAB — SARS CORONAVIRUS 2 (TAT 6-24 HRS): SARS Coronavirus 2: NEGATIVE

## 2019-11-16 NOTE — Anesthesia Preprocedure Evaluation (Addendum)
Anesthesia Evaluation  Patient identified by MRN, date of birth, ID band Patient awake    Reviewed: Allergy & Precautions, NPO status , Patient's Chart, lab work & pertinent test results  Airway Mallampati: II  TM Distance: >3 FB Neck ROM: Full    Dental no notable dental hx. (+) Teeth Intact, Dental Advisory Given   Pulmonary asthma , sleep apnea ,    Pulmonary exam normal breath sounds clear to auscultation       Cardiovascular hypertension, Pt. on medications Normal cardiovascular exam Rhythm:Regular Rate:Normal  08/06/16 Echo Left ventricle: The cavity size was normal. There was moderate  concentric hypertrophy. Systolic function was normal. The  estimated ejection fraction was in the range of 60% to 65%.    Neuro/Psych PSYCHIATRIC DISORDERS Depression negative neurological ROS     GI/Hepatic negative GI ROS, Neg liver ROS,   Endo/Other  negative endocrine ROS  Renal/GU negative Renal ROS     Musculoskeletal  (+) Arthritis ,   Abdominal (+) + obese,   Peds  Hematology negative hematology ROS (+)   Anesthesia Other Findings   Reproductive/Obstetrics                            Anesthesia Physical Anesthesia Plan  ASA: III  Anesthesia Plan: General   Post-op Pain Management:    Induction: Intravenous  PONV Risk Score and Plan: Treatment may vary due to age or medical condition, Ondansetron and Dexamethasone  Airway Management Planned: LMA  Additional Equipment: None  Intra-op Plan:   Post-operative Plan:   Informed Consent: I have reviewed the patients History and Physical, chart, labs and discussed the procedure including the risks, benefits and alternatives for the proposed anesthesia with the patient or authorized representative who has indicated his/her understanding and acceptance.     Dental advisory given  Plan Discussed with:   Anesthesia Plan Comments:         Anesthesia Quick Evaluation

## 2019-11-17 ENCOUNTER — Other Ambulatory Visit: Payer: Self-pay

## 2019-11-17 ENCOUNTER — Ambulatory Visit (HOSPITAL_BASED_OUTPATIENT_CLINIC_OR_DEPARTMENT_OTHER)
Admission: RE | Admit: 2019-11-17 | Discharge: 2019-11-17 | Disposition: A | Discharge status: Home / Self Care | Payer: Medicare PPO | Attending: Urology | Admitting: Urology

## 2019-11-17 ENCOUNTER — Encounter (HOSPITAL_BASED_OUTPATIENT_CLINIC_OR_DEPARTMENT_OTHER)
Admission: RE | Disposition: A | Discharge status: Home / Self Care | Payer: Self-pay | Source: Home / Self Care | Attending: Urology

## 2019-11-17 ENCOUNTER — Ambulatory Visit (HOSPITAL_BASED_OUTPATIENT_CLINIC_OR_DEPARTMENT_OTHER): Payer: Medicare PPO | Admitting: Anesthesiology

## 2019-11-17 ENCOUNTER — Encounter (HOSPITAL_BASED_OUTPATIENT_CLINIC_OR_DEPARTMENT_OTHER): Payer: Self-pay | Admitting: Urology

## 2019-11-17 DIAGNOSIS — N200 Calculus of kidney: Secondary | ICD-10-CM | POA: Diagnosis not present

## 2019-11-17 DIAGNOSIS — Z87442 Personal history of urinary calculi: Secondary | ICD-10-CM | POA: Insufficient documentation

## 2019-11-17 DIAGNOSIS — Z880 Allergy status to penicillin: Secondary | ICD-10-CM | POA: Diagnosis not present

## 2019-11-17 DIAGNOSIS — J4530 Mild persistent asthma, uncomplicated: Secondary | ICD-10-CM | POA: Diagnosis not present

## 2019-11-17 DIAGNOSIS — Z9841 Cataract extraction status, right eye: Secondary | ICD-10-CM | POA: Insufficient documentation

## 2019-11-17 DIAGNOSIS — Z9842 Cataract extraction status, left eye: Secondary | ICD-10-CM | POA: Diagnosis not present

## 2019-11-17 DIAGNOSIS — M1990 Unspecified osteoarthritis, unspecified site: Secondary | ICD-10-CM | POA: Insufficient documentation

## 2019-11-17 DIAGNOSIS — I10 Essential (primary) hypertension: Secondary | ICD-10-CM | POA: Insufficient documentation

## 2019-11-17 DIAGNOSIS — Z8249 Family history of ischemic heart disease and other diseases of the circulatory system: Secondary | ICD-10-CM | POA: Insufficient documentation

## 2019-11-17 DIAGNOSIS — Z961 Presence of intraocular lens: Secondary | ICD-10-CM | POA: Insufficient documentation

## 2019-11-17 DIAGNOSIS — Z885 Allergy status to narcotic agent status: Secondary | ICD-10-CM | POA: Diagnosis not present

## 2019-11-17 HISTORY — PX: HOLMIUM LASER APPLICATION: SHX5852

## 2019-11-17 HISTORY — DX: Restless legs syndrome: G25.81

## 2019-11-17 HISTORY — DX: Low back pain, unspecified: M54.50

## 2019-11-17 HISTORY — DX: Major depressive disorder, single episode, unspecified: F32.9

## 2019-11-17 HISTORY — DX: Mild persistent asthma, uncomplicated: J45.30

## 2019-11-17 HISTORY — DX: Other chronic pain: G89.29

## 2019-11-17 HISTORY — PX: CYSTOSCOPY/URETEROSCOPY/HOLMIUM LASER/STENT PLACEMENT: SHX6546

## 2019-11-17 HISTORY — DX: Insomnia, unspecified: G47.00

## 2019-11-17 HISTORY — DX: Generalized anxiety disorder: F41.1

## 2019-11-17 LAB — POCT I-STAT, CHEM 8
BUN: 11 mg/dL (ref 8–23)
Calcium, Ion: 1.25 mmol/L (ref 1.15–1.40)
Chloride: 104 mmol/L (ref 98–111)
Creatinine, Ser: 0.9 mg/dL (ref 0.44–1.00)
Glucose, Bld: 142 mg/dL — ABNORMAL HIGH (ref 70–99)
HCT: 40 % (ref 36.0–46.0)
Hemoglobin: 13.6 g/dL (ref 12.0–15.0)
Potassium: 3.4 mmol/L — ABNORMAL LOW (ref 3.5–5.1)
Sodium: 142 mmol/L (ref 135–145)
TCO2: 26 mmol/L (ref 22–32)

## 2019-11-17 SURGERY — CYSTOSCOPY/URETEROSCOPY/HOLMIUM LASER/STENT PLACEMENT
Anesthesia: General | Site: Renal | Laterality: Left

## 2019-11-17 MED ORDER — CIPROFLOXACIN HCL 500 MG PO TABS
500.0000 mg | ORAL_TABLET | Freq: Two times a day (BID) | ORAL | 0 refills | Status: AC
Start: 2019-11-17 — End: 2019-11-20

## 2019-11-17 MED ORDER — SODIUM CHLORIDE 0.9 % IR SOLN
Status: DC | PRN
  Administered 2019-11-17: 3000 mL

## 2019-11-17 MED ORDER — CIPROFLOXACIN IN D5W 400 MG/200ML IV SOLN
400.0000 mg | Freq: Once | INTRAVENOUS | Status: AC
  Administered 2019-11-17: 400 mg via INTRAVENOUS

## 2019-11-17 MED ORDER — LIDOCAINE 2% (20 MG/ML) 5 ML SYRINGE
INTRAMUSCULAR | Status: DC | PRN
  Administered 2019-11-17: 60 mg via INTRAVENOUS

## 2019-11-17 MED ORDER — PROPOFOL 10 MG/ML IV BOLUS
INTRAVENOUS | Status: AC
  Filled 2019-11-17: qty 20

## 2019-11-17 MED ORDER — PHENAZOPYRIDINE HCL 200 MG PO TABS
200.0000 mg | ORAL_TABLET | Freq: Three times a day (TID) | ORAL | 0 refills | Status: AC | PRN
Start: 2019-11-17 — End: 2020-11-16

## 2019-11-17 MED ORDER — PHENYLEPHRINE 40 MCG/ML (10ML) SYRINGE FOR IV PUSH (FOR BLOOD PRESSURE SUPPORT)
PREFILLED_SYRINGE | INTRAVENOUS | Status: DC | PRN
  Administered 2019-11-17 (×3): 80 ug via INTRAVENOUS
  Administered 2019-11-17: 120 ug via INTRAVENOUS
  Administered 2019-11-17: 80 ug via INTRAVENOUS
  Administered 2019-11-17: 40 ug via INTRAVENOUS
  Administered 2019-11-17 (×4): 80 ug via INTRAVENOUS

## 2019-11-17 MED ORDER — EPHEDRINE 5 MG/ML INJ
INTRAVENOUS | Status: AC
  Filled 2019-11-17: qty 10

## 2019-11-17 MED ORDER — DEXAMETHASONE SODIUM PHOSPHATE 10 MG/ML IJ SOLN
INTRAMUSCULAR | Status: DC | PRN
  Administered 2019-11-17: 5 mg via INTRAVENOUS

## 2019-11-17 MED ORDER — IOHEXOL 300 MG/ML  SOLN
INTRAMUSCULAR | Status: DC | PRN
  Administered 2019-11-17: 10 mL

## 2019-11-17 MED ORDER — LIDOCAINE 2% (20 MG/ML) 5 ML SYRINGE
INTRAMUSCULAR | Status: AC
  Filled 2019-11-17: qty 5

## 2019-11-17 MED ORDER — DEXAMETHASONE SODIUM PHOSPHATE 10 MG/ML IJ SOLN
INTRAMUSCULAR | Status: AC
  Filled 2019-11-17: qty 1

## 2019-11-17 MED ORDER — PHENYLEPHRINE 40 MCG/ML (10ML) SYRINGE FOR IV PUSH (FOR BLOOD PRESSURE SUPPORT)
PREFILLED_SYRINGE | INTRAVENOUS | Status: AC
  Filled 2019-11-17: qty 10

## 2019-11-17 MED ORDER — ONDANSETRON HCL 4 MG/2ML IJ SOLN
INTRAMUSCULAR | Status: AC
  Filled 2019-11-17: qty 2

## 2019-11-17 MED ORDER — FENTANYL CITRATE (PF) 100 MCG/2ML IJ SOLN
INTRAMUSCULAR | Status: AC
  Filled 2019-11-17: qty 2

## 2019-11-17 MED ORDER — KETOROLAC TROMETHAMINE 30 MG/ML IJ SOLN
INTRAMUSCULAR | Status: DC | PRN
  Administered 2019-11-17: 15 mg via INTRAVENOUS

## 2019-11-17 MED ORDER — OXYBUTYNIN CHLORIDE 5 MG PO TABS: 5.0000 mg | ORAL_TABLET | Freq: Three times a day (TID) | ORAL | 1 refills | Status: DC | PRN

## 2019-11-17 MED ORDER — TRAMADOL HCL 50 MG PO TABS: 50.0000 mg | ORAL_TABLET | Freq: Four times a day (QID) | ORAL | 0 refills | Status: AC | PRN

## 2019-11-17 MED ORDER — ONDANSETRON HCL 4 MG/2ML IJ SOLN
INTRAMUSCULAR | Status: DC | PRN
  Administered 2019-11-17: 4 mg via INTRAVENOUS

## 2019-11-17 MED ORDER — GLYCOPYRROLATE 0.2 MG/ML IJ SOLN
INTRAMUSCULAR | Status: DC | PRN
Start: 2019-11-17 — End: 2019-11-17
  Administered 2019-11-17 (×2): .1 mg via INTRAVENOUS

## 2019-11-17 MED ORDER — PROPOFOL 10 MG/ML IV BOLUS
INTRAVENOUS | Status: DC | PRN
  Administered 2019-11-17: 150 mg via INTRAVENOUS

## 2019-11-17 MED ORDER — CIPROFLOXACIN IN D5W 400 MG/200ML IV SOLN
INTRAVENOUS | Status: AC
  Filled 2019-11-17: qty 200

## 2019-11-17 MED ORDER — LACTATED RINGERS IV SOLN: INTRAVENOUS | Status: DC

## 2019-11-17 MED ORDER — FENTANYL CITRATE (PF) 100 MCG/2ML IJ SOLN
INTRAMUSCULAR | Status: DC | PRN
  Administered 2019-11-17 (×2): 50 ug via INTRAVENOUS
  Administered 2019-11-17: 25 ug via INTRAVENOUS

## 2019-11-17 MED ORDER — KETOROLAC TROMETHAMINE 30 MG/ML IJ SOLN
INTRAMUSCULAR | Status: AC
  Filled 2019-11-17: qty 1

## 2019-11-17 MED ORDER — EPHEDRINE SULFATE-NACL 50-0.9 MG/10ML-% IV SOSY
PREFILLED_SYRINGE | INTRAVENOUS | Status: DC | PRN
  Administered 2019-11-17: 5 mg via INTRAVENOUS
  Administered 2019-11-17 (×2): 10 mg via INTRAVENOUS
  Administered 2019-11-17: 15 mg via INTRAVENOUS
  Administered 2019-11-17: 10 mg via INTRAVENOUS

## 2019-11-17 SURGICAL SUPPLY — 27 items
APL SKNCLS STERI-STRIP NONHPOA (GAUZE/BANDAGES/DRESSINGS)
BAG DRAIN URO-CYSTO SKYTR STRL (DRAIN) ×2 IMPLANT
BAG DRN UROCATH (DRAIN) ×1
BASKET STONE 1.7 NGAGE (UROLOGICAL SUPPLIES) ×2 IMPLANT
BASKET ZERO TIP NITINOL 2.4FR (BASKET) ×1 IMPLANT
BENZOIN TINCTURE PRP APPL 2/3 (GAUZE/BANDAGES/DRESSINGS) IMPLANT
BSKT STON RTRVL ZERO TP 2.4FR (BASKET)
CATH URET 5FR 28IN OPEN ENDED (CATHETERS) ×1 IMPLANT
CLOTH BEACON ORANGE TIMEOUT ST (SAFETY) ×2 IMPLANT
FIBER LASER FLEXIVA 365 (UROLOGICAL SUPPLIES) IMPLANT
FIBER LASER TRAC TIP (UROLOGICAL SUPPLIES) ×1 IMPLANT
GLOVE BIO SURGEON STRL SZ7 (GLOVE) ×2 IMPLANT
GLOVE BIO SURGEON STRL SZ7.5 (GLOVE) ×2 IMPLANT
GOWN STRL REUS W/TWL XL LVL3 (GOWN DISPOSABLE) ×3 IMPLANT
GUIDEWIRE STR DUAL SENSOR (WIRE) ×1 IMPLANT
GUIDEWIRE ZIPWRE .038 STRAIGHT (WIRE) ×2 IMPLANT
IV NS IRRIG 3000ML ARTHROMATIC (IV SOLUTION) ×3 IMPLANT
KIT TURNOVER CYSTO (KITS) ×2 IMPLANT
MANIFOLD NEPTUNE II (INSTRUMENTS) ×2 IMPLANT
NS IRRIG 500ML POUR BTL (IV SOLUTION) ×2 IMPLANT
SHEATH URET ACCESS 12FR/35CM (UROLOGICAL SUPPLIES) ×1 IMPLANT
STENT URET 6FRX24 CONTOUR (STENTS) ×1 IMPLANT
STRIP CLOSURE SKIN 1/2X4 (GAUZE/BANDAGES/DRESSINGS) IMPLANT
SYR 10ML LL (SYRINGE) ×2 IMPLANT
TRAY CYSTO PACK (CUSTOM PROCEDURE TRAY) ×2 IMPLANT
TUBE CONNECTING 12X1/4 (SUCTIONS) IMPLANT
TUBING UROLOGY SET (TUBING) ×2 IMPLANT

## 2019-11-17 NOTE — H&P (Signed)
Urology Preoperative H&P   Chief Complaint: Left renal stones  History of Present Illness: Haley Singh is a 69 y.o. female with a history of kidney stones. She is status post cystoscopy with right ureteroscopy, holmium laser lithotripsy and right JJ stent placement on 08/26/2019.   She was seen on 10/12/19 for ongoing left sided flank pain that has since resolved after passing multiple small stones.  KUB and RUS from May showed multiple non-obstructing left renal stones.  She denies interval stone passage, UTIs, dysuria or hematuria.     Past Medical History:  Diagnosis Date  . Arthritis   . Chronic low back pain   . Depression   . GAD (generalized anxiety disorder)   . History of kidney stones   . 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 Hyperlipidemia   . Hypertension    followed by pcp  (11-10-2019 pt stated never had a stress test)  . Insomnia   . MDD (major depressive disorder)   . Mild persistent asthma    followed by pcp and dr Belva Crome (pulmonologist)  . RLS (restless legs syndrome)     Past Surgical History:  Procedure Laterality Date  . CATARACT EXTRACTION W/ INTRAOCULAR LENS  IMPLANT, BILATERAL  2009; 2010  . CYSTOSCOPY/URETEROSCOPY/HOLMIUM LASER/STENT PLACEMENT Right 08/26/2019   Procedure: CYSTOSCOPY/RETROGRADE/URETEROSCOPY/HOLMIUM LASER/STENT PLACEMENT;  Surgeon: Rene Paci, MD;  Location[facility];  Service: Urology;  Laterality: Right;  . EXPLORATORY LAPAROTOMY W/ BOWEL RESECTION  01-15-2004  @[facility]    small bowel resection for stricture/ benzoar  . EXTRACORPOREAL SHOCK WAVE LITHOTRIPSY Right 03/27/2018   Procedure: EXTRACORPOREAL SHOCK WAVE LITHOTRIPSY (ESWL);  Surgeon: 03/29/2018, MD;  Location: [facility];  Service: Urology;  Laterality: Right;  . EXTRACORPOREAL SHOCK WAVE LITHOTRIPSY  x2 2008;  2009;  2015  . ORIF ANKLE FRACTURE Right 09/ 2008 @[facility]    per  pt has retained hardware  . TONSILLECTOMY  age 37    Allergies:  Allergies  Allergen Reactions  . Oxycontin [Oxycodone] Itching  . Penicillins Other (See Comments)    unk Did it involve swelling of the face/tongue/throat, SOB, or low BP? Unknown Did it involve sudden or severe rash/hives, skin peeling, or any reaction on the inside of your mouth or nose? Unknown Did you need to seek medical attention at a hospital or doctor's office? Unknown When did it last happen?over 10 years ago If all above answers are "NO", may proceed with cephalosporin use.   . Tetracyclines & Related Itching    Family History  Problem Relation Age of Onset  . Breast cancer Maternal Aunt   . Heart disease Father   . Heart Problems Father     Social History:  reports that she has never smoked. She has never used smokeless tobacco. She reports current alcohol use of about 7.0 standard drinks of alcohol per week. She reports that she does not use drugs.  ROS: A complete review of systems was performed.  All systems are negative except for pertinent findings as noted.  Physical Exam:  Vital signs in last 24 hours: Temp:  [98.4 F (36.9 C)] 98.4 F (36.9 C) (06/15 0826) Pulse Rate:  [65] 65 (06/15 0826) Resp:  [18] 18 (06/15 0826) BP: (157)/(75) 157/75 (06/15 0826) SpO2:  [97 %] 97 % (06/15 0826) Weight:  [92.7 kg] 92.7 kg (06/15 0826) Constitutional:  Alert and oriented, No acute distress Cardiovascular: Regular rate  and rhythm, No JVD Respiratory: Normal respiratory effort, Lungs clear bilaterally GI: Abdomen is soft, nontender, nondistended, no abdominal masses GU: No CVA tenderness Lymphatic: No lymphadenopathy Neurologic: Grossly intact, no focal deficits Psychiatric: Normal mood and affect  Laboratory Data:  Recent Labs    11/17/19 0829  HGB 13.6  HCT 40.0    Recent Labs    11/17/19 0829  NA 142  K 3.4*  CL 104  GLUCOSE 142*  BUN 11  CREATININE 0.90     Results for  orders placed or performed during the hospital encounter of 11/17/19 (from the past 24 hour(s))  I-STAT, chem 8     Status: Abnormal   Collection Time: 11/17/19  8:29 AM  Result Value Ref Range   Sodium 142 135 - 145 mmol/L   Potassium 3.4 (L) 3.5 - 5.1 mmol/L   Chloride 104 98 - 111 mmol/L   BUN 11 8 - 23 mg/dL   Creatinine, Ser 0.90 0.44 - 1.00 mg/dL   Glucose, Bld 142 (H) 70 - 99 mg/dL   Calcium, Ion 1.25 1.15 - 1.40 mmol/L   TCO2 26 22 - 32 mmol/L   Hemoglobin 13.6 12.0 - 15.0 g/dL   HCT 40.0 36 - 46 %   Recent Results (from the past 240 hour(s))  SARS CORONAVIRUS 2 (TAT 6-24 HRS) Nasopharyngeal Nasopharyngeal Swab     Status: None   Collection Time: 11/13/19 12:38 PM   Specimen: Nasopharyngeal Swab  Result Value Ref Range Status   SARS Coronavirus 2 NEGATIVE NEGATIVE Final    Comment: (NOTE) SARS-CoV-2 target nucleic acids are NOT DETECTED.  The SARS-CoV-2 RNA is generally detectable in upper and lower respiratory specimens during the acute phase of infection. Negative results do not preclude SARS-CoV-2 infection, do not rule out co-infections with other pathogens, and should not be used as the sole basis for treatment or other patient management decisions. Negative results must be combined with clinical observations, patient history, and epidemiological information. The expected result is Negative.  Fact Sheet for Patients: SugarRoll.be  Fact Sheet for Healthcare Providers: https://www.woods-mathews.com/  This test is not yet approved or cleared by the [place] FDA and  has been authorized for detection and/or diagnosis of SARS-CoV-2 by FDA under an Emergency Use Authorization (EUA). This EUA will remain  in effect (meaning this test can be used) for the duration of the COVID-19 declaration under Se ction 564(b)(1) of the Act, 21 U.S.C. section 360bbb-3(b)(1), unless the authorization is terminated or revoked  sooner.  Performed at [facility], [place] [address], [place], [place] [address]     Renal Function: Recent Labs    11/17/19 9629  CREATININE 0.90   Estimated Creatinine Clearance: 65.8 mL/min (by C-G formula based on SCr of 0.9 mg/dL).  Radiologic Imaging: No results found.  I independently reviewed the above imaging studies.  Assessment and Plan Haley Singh is a 69 y.o. female with multiple non-obstructing left renal stones  The risks, benefits and alternatives of cystoscopy with LEFT ureteroscopy, laser lithotripsy and ureteral stent placement was discussed the patient.  Risks included, but are not limited to: bleeding, urinary tract infection, ureteral injury/avulsion, ureteral stricture formation, retained stone fragments, the possibility that multiple surgeries may be required to treat the stone(s), MI, stroke, PE and the inherent risks of general anesthesia.  The patient voices understanding and wishes to proceed.     Ellison Hughs, MD 11/17/2019, 9:57 AM  [facility] Pager: [phone]

## 2019-11-17 NOTE — Anesthesia Procedure Notes (Signed)
Procedure Name: LMA Insertion Date/Time: 11/17/2019 10:15 AM Performed by: Briant Sites, CRNA Pre-anesthesia Checklist: Patient identified, Emergency Drugs available, Suction available and Patient being monitored Patient Re-evaluated:Patient Re-evaluated prior to induction Oxygen Delivery Method: Circle system utilized Preoxygenation: Pre-oxygenation with 100% oxygen Induction Type: IV induction Ventilation: Mask ventilation without difficulty LMA: LMA inserted LMA Size: 4.0 Number of attempts: 1 Airway Equipment and Method: Bite block Placement Confirmation: positive ETCO2 Tube secured with: Tape Dental Injury: Teeth and Oropharynx as per pre-operative assessment

## 2019-11-17 NOTE — Transfer of Care (Signed)
Immediate Anesthesia Transfer of Care Note  Patient: Haley Singh  Procedure(s) Performed: CYSTOSCOPY/URETEROSCOPY, RETROGRADE,LASER LITHOTRIPSY, STENT PLACEMENT (Left Renal) HOLMIUM LASER APPLICATION (Left Renal)  Patient Location: PACU  Anesthesia Type:General  Level of Consciousness: drowsy  Airway & Oxygen Therapy: Patient Spontanous Breathing and Patient connected to nasal cannula oxygen  Post-op Assessment: Report given to RN  Post vital signs: Reviewed and stable  Last Vitals:  Vitals Value Taken Time  BP 121/69 11/17/19 1136  Temp    Pulse 92 11/17/19 1137  Resp 18 11/17/19 1137  SpO2 95 % 11/17/19 1137  Vitals shown include unvalidated device data.  Last Pain:  Vitals:   11/17/19 0826  TempSrc: Oral         Complications: No complications documented.

## 2019-11-17 NOTE — Op Note (Signed)
Operative Note  Preoperative diagnosis:  1.  Multiple nonobstructing left renal stones measuring approximately 5 mm in greatest dimension  Postoperative diagnosis: Same  Procedure(s): 1.  Cystoscopy with left ureteroscopy, holmium laser lithotripsy and left JJ stent placement 2.  Left retrograde pyelogram with intraoperative interpretation of fluoroscopic imaging  Surgeon: Rhoderick Moody, MD  Assistants:  None  Anesthesia:  General  Complications:  None  EBL: Less than 5 mL  Specimens: 1.  Left renal stone fragments  Drains/Catheters: 1.  Left 6 French, 24 cm JJ stent without tether  Intraoperative findings:   1. Solitary left collecting system with no filling defects or dilation involving the left ureter or left renal pelvis seen on retrograde pyelogram.  There were multiple calcifications noted on fluoroscopy consistent with the nonobstructing stone seen on recent cross-sectional imaging 2. Nonobstructing left renal stones  Indication:  Haley Singh is a 69 y.o. female with kidney stones.  She was found to have multiple nonobstructing left renal stones on follow-up KUB and renal ultrasound on 10/12/2019.  She has been consented for the above procedures, voices understanding and wishes to proceed.  Description of procedure:  After informed consent was obtained, the patient was brought to the operating room and general LMA anesthesia was administered. The patient was then placed in the dorsolithotomy position and prepped and draped in the usual sterile fashion. A timeout was performed. A 23 French rigid cystoscope was then inserted into the urethral meatus and advanced into the bladder under direct vision. A complete bladder survey revealed no intravesical pathology.  A 5 French ureteral catheter was then inserted into the left ureteral orifice and a retrograde pyelogram was obtained, with the findings listed above.  A Glidewire was then used to intubate the lumen of the  ureteral catheter and was advanced up to the left renal pelvis, under fluoroscopic guidance.  The catheter was then removed, leaving the wire in place.  An additional sensor wire was then placed up the left ureter in a similar fashion, confirming placement via fluoroscopy.  A 14 French, 35 cm ureteral access sheath was then placed over the sensor wire and into position within the proximal aspects of the left ureter.  The flexible ureteroscope was then advanced through the ureteral access sheath and into the left renal pelvis for multiple stones were identified.  A 200 m holmium laser was then used to dust the stones.  Following fragmentation of the stones, an engage basket was then used to extract several of the larger stone fragments.  The ureteral access sheath and ureteroscope were then removed under direct vision, revealing no evidence of ureteral trauma or ureteral stone burden.  A 6 French, 24 cm JJ stent was then advanced over the Glidewire and into good position within the left collecting system, confirming placement via fluoroscopy.  The patient's bladder was then drained.  She tolerated the procedure well and was transferred to the postanesthesia in stable condition.  Plan: Follow-up 6/24/21for office cystoscopy and stent removal.

## 2019-11-17 NOTE — Discharge Instructions (Signed)

## 2019-11-18 ENCOUNTER — Encounter (HOSPITAL_BASED_OUTPATIENT_CLINIC_OR_DEPARTMENT_OTHER): Payer: Self-pay | Admitting: Urology

## 2019-11-18 NOTE — Anesthesia Postprocedure Evaluation (Signed)
Anesthesia Post Note  Patient: Haley Singh  Procedure(s) Performed: CYSTOSCOPY/URETEROSCOPY, RETROGRADE,LASER LITHOTRIPSY, STENT PLACEMENT (Left Renal) HOLMIUM LASER APPLICATION (Left Renal)     Patient location during evaluation: PACU Anesthesia Type: General Level of consciousness: awake and alert Pain management: pain level controlled Vital Signs Assessment: post-procedure vital signs reviewed and stable Respiratory status: spontaneous breathing, nonlabored ventilation, respiratory function stable and patient connected to nasal cannula oxygen Cardiovascular status: blood pressure returned to baseline and stable Postop Assessment: no apparent nausea or vomiting Anesthetic complications: no   No complications documented.  Last Vitals:  Vitals:   11/17/19 1215 11/17/19 1245  BP: (!) 110/59 118/65  Pulse: 75 78  Resp: 16 16  Temp:  (!) 36.4 C  SpO2: 96% 93%    Last Pain:  Vitals:   11/17/19 1230  TempSrc:   PainSc: 0-No pain                 Trevor Iha

## 2019-12-02 ENCOUNTER — Other Ambulatory Visit: Payer: Self-pay

## 2019-12-02 ENCOUNTER — Ambulatory Visit: Payer: Medicare PPO | Admitting: Pulmonary Disease

## 2019-12-02 ENCOUNTER — Encounter: Payer: Self-pay | Admitting: Pulmonary Disease

## 2019-12-02 VITALS — BP 132/70 | HR 78 | Temp 98.4°F | Ht 66.0 in | Wt 207.0 lb

## 2019-12-02 DIAGNOSIS — J45909 Unspecified asthma, uncomplicated: Secondary | ICD-10-CM | POA: Diagnosis not present

## 2019-12-02 MED ORDER — BREO ELLIPTA 100-25 MCG/INH IN AEPB: 1.0000 | INHALATION_SPRAY | Freq: Every day | RESPIRATORY_TRACT | 5 refills | Status: DC

## 2019-12-02 NOTE — Patient Instructions (Signed)
We will renew your Breo inhaler.  Keep it at home and use it as needed in case of worsening dyspnea. Follow-up in 6 months.

## 2019-12-02 NOTE — Progress Notes (Signed)
Haley Singh    [id number]    1951-04-18  Primary Care Physician:Briscoe, Sharrie Rothman, MD  Referring Physician: Macy Mis, MD [address] [place],  [place] [address]  Chief complaint:   Follow up for Mild persistent asthma.  HPI: Haley Singh is 69 year old with past medical history of asthma, hypertension, hyperlipidemia, depression. She has been diagnosed with asthma about 15 years ago. She was initially on Advair which was then switched to Qvar.  She was then changed to [facility] in 2018 as symptoms are not well controlled. She saw an allergist asked about 15 years ago and was told that she had sensitivity to dust, dogs, cats, weeds.  Did not tolerate coming off of Breo in 2020. 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 She does not have any exposures at work or at home. She does not have any mold issues of pets. She is a nonsmoker.  Interim History: Relative breo about 10 days ago.  Has not noted any change in symptoms off inhaler.  She is wondering if she needs to continue on it  Outpatient Encounter Medications as of 12/02/2019  Medication Sig  . albuterol (PROVENTIL HFA;VENTOLIN HFA) 108 (90 BASE) MCG/ACT inhaler Inhale 1 puff into the lungs every 6 (six) hours as needed for wheezing or shortness of breath.  Marland Kitchen amLODipine (NORVASC) 5 MG tablet Take 5 mg by mouth daily.   Marland Kitchen atorvastatin (LIPITOR) 40 MG tablet Take 40 mg by mouth daily.   . celecoxib (CELEBREX) 200 MG capsule Take 400 mg by mouth daily.   . cetirizine (ZYRTEC) 10 MG tablet Take 10 mg by mouth daily.  . cyclobenzaprine (FLEXERIL) 10 MG tablet Take 10 mg by mouth 3 (three) times daily as needed for muscle spasms.  . fluticasone (FLONASE) 50 MCG/ACT nasal spray Place 1 spray into both nostrils 2 (two) times daily.   Marland Kitchen gabapentin (NEURONTIN) 100 MG capsule Take 200 mg by mouth at bedtime.   Marland Kitchen losartan (COZAAR) 50 MG  tablet Take 50 mg by mouth daily.   . montelukast (SINGULAIR) 10 MG tablet Take 10 mg by mouth daily.   . ondansetron (ZOFRAN ODT) 8 MG disintegrating tablet Take 1 tablet (8 mg total) by mouth every 8 (eight) hours as needed.  Marland Kitchen oxybutynin (DITROPAN) 5 MG tablet Take 1 tablet (5 mg total) by mouth every 8 (eight) hours as needed for bladder spasms.  . phenazopyridine (PYRIDIUM) 200 MG tablet Take 1 tablet (200 mg total) by mouth 3 (three) times daily as needed (for pain with urination).  . traZODone (DESYREL) 150 MG tablet Take 150 mg by mouth at bedtime.   Marland Kitchen venlafaxine XR (EFFEXOR-XR) 150 MG 24 hr capsule Take 150 mg by mouth every morning.   . fluticasone furoate-vilanterol (BREO ELLIPTA) 100-25 MCG/INH AEPB Inhale 1 puff into the lungs daily. (Patient not taking: Reported on 12/02/2019)   No facility-administered encounter medications on file as of 12/02/2019.   Physical Exam: Blood pressure 132/70, pulse 78, temperature 98.4 F (36.9 C), temperature source Oral, height 5\' 6"  (1.676 m), weight 207 lb (93.9 kg), SpO2 97 %. Gen:      No acute distress HEENT:  EOMI, sclera anicteric Neck:     No masses; no thyromegaly Lungs:    Clear to auscultation bilaterally; normal respiratory effort CV:  Regular rate and rhythm; no murmurs Abd:      + bowel sounds; soft, non-tender; no palpable masses, no distension Ext:    No edema; adequate peripheral perfusion Skin:      Warm and dry; no rash Neuro: alert and oriented x 3 Psych: normal mood and affect  Data Reviewed: Sleep Study 05/19/13- This study did not show evidence for sleep disordered breathing. Chest x-ray 01/21/13-no acute cardiopulmonary abnormality.I have reviewed all images personally  FENO 05/17/16- 31 FENO 2/2/118- 23   FENO 05/10/17- 18 FENO 11/28/17-22                                                         PFTs 07/06/16 FVC 2.81 (84%), FEV1 2.37 (93%), F/F 84, TLC 113%, DLCO 64% Isolated reduction in diffusion  capacity.  CBC with diff 05/17/16- WBC 8, eosinophil percentage 0.1. Absolute eos count 8 Blood allergy profile 05/17/16- Postive for ragweed allergy. IgE 36  Reviewed fax data from primary care office Chest x-ray 01/18/16-no acute cardiopulmonary abnormality  Spirometry 01/16/16 FVC 2.56 (74%), FEV1 1.88 [71%), F/F 73.3 No obstruction, restriction possible.  Assessment:  Chronic cough, secondary to ACE inhibitor.   Off Benzapril and on losartan improvement in symptoms.  Mild persistent asthma Currently off of Breo and is doing well. She has not tolerated coming off inhalers last year but seems to be in a better position now as she is off ACE inhibitor  I will renew her Virgel Bouquet but told her to hold off on using it as long as her breathing is doing well.  Continue Singulair.  Allergies, postnasal drip Zyrtec, Flonase over-the-counter  Plan/Recommendations: - Monitor off Breo.  Albuterol as needed - Zyrtec, Singulair, Flonase.  Return in 6 months  Chilton Greathouse MD [facility] 12/02/2019, 2:05 PM  CC: Macy Mis, MD

## 2020-01-04 NOTE — Telephone Encounter (Signed)
Dr. Isaiah Serge please advise.  I had to start using the Advair you prescribed but so far my coughing hasn't gotten better only worse.  I started using it just a few days after my last appointment.  Please call in a prescription for Breo  or something else you'd like me to try.

## 2020-01-05 NOTE — Telephone Encounter (Signed)
Please tell her to resume Breo 200 Please send in a prescription if she needs it

## 2020-01-29 ENCOUNTER — Other Ambulatory Visit: Payer: Self-pay | Admitting: Pulmonary Disease

## 2020-08-01 ENCOUNTER — Other Ambulatory Visit: Payer: Self-pay | Admitting: Pulmonary Disease

## 2020-12-07 ENCOUNTER — Other Ambulatory Visit: Payer: Self-pay

## 2020-12-07 ENCOUNTER — Emergency Department (HOSPITAL_COMMUNITY)
Admission: EM | Admit: 2020-12-07 | Discharge: 2020-12-08 | Disposition: A | Discharge status: Home / Self Care | Payer: Medicare PPO | Attending: Emergency Medicine | Admitting: Emergency Medicine

## 2020-12-07 DIAGNOSIS — G43909 Migraine, unspecified, not intractable, without status migrainosus: Secondary | ICD-10-CM | POA: Diagnosis present

## 2020-12-07 DIAGNOSIS — I10 Essential (primary) hypertension: Secondary | ICD-10-CM | POA: Diagnosis not present

## 2020-12-07 DIAGNOSIS — H53149 Visual discomfort, unspecified: Secondary | ICD-10-CM | POA: Insufficient documentation

## 2020-12-07 DIAGNOSIS — Z79899 Other long term (current) drug therapy: Secondary | ICD-10-CM | POA: Insufficient documentation

## 2020-12-07 DIAGNOSIS — Z7951 Long term (current) use of inhaled steroids: Secondary | ICD-10-CM | POA: Insufficient documentation

## 2020-12-07 DIAGNOSIS — G43901 Migraine, unspecified, not intractable, with status migrainosus: Secondary | ICD-10-CM | POA: Insufficient documentation

## 2020-12-07 DIAGNOSIS — J4530 Mild persistent asthma, uncomplicated: Secondary | ICD-10-CM | POA: Insufficient documentation

## 2020-12-07 MED ORDER — PROCHLORPERAZINE MALEATE 5 MG PO TABS
10.0000 mg | ORAL_TABLET | Freq: Once | ORAL | Status: AC
  Administered 2020-12-07: 10 mg via ORAL
  Filled 2020-12-07: qty 2

## 2020-12-07 NOTE — ED Provider Notes (Signed)
Emergency Medicine Provider Triage Evaluation Note  ARMANDO LAUMAN , a 70 y.o. female  was evaluated in triage.  Pt complains of migraine headache.  Patient reports that she has had migraine headaches over the last 3 days.  Patient reports that pain is throughout her entire head.  Patient rates pain 10/10 on the pain scale.  Pain feels similar to previous migraine she has had.  Endorses photophobia and worsening pain with loud noises.  Patient denies any numbness, weakness, slurred speech, or facial asymmetry patient has not had any  Review of Systems  Positive: , Photophobia, headache Negative: umbness, weakness, slurred speech, or facial asymmetry  Physical Exam  BP (!) 171/86 (BP Location: Left Arm)   Pulse 81   Temp 99 F (37.2 C) (Oral)   Resp 18   SpO2 99%  Gen:   Awake, no distress   Resp:  Normal effort  MSK:   Moves extremities without difficulty  Other:  No facial asymmetry or slurred speech.  No pronator drift..  +5 strength to bilateral upper and lower extremities.  Medical Decision Making  Medically screening exam initiated at 8:01 PM.  Appropriate orders placed.  CORAIMA TIBBS was informed that the remainder of the evaluation will be completed by another provider, this initial triage assessment does not replace that evaluation, and the importance of remaining in the ED until their evaluation is complete.  The patient appears stable so that the remainder of the work up may be completed by another provider.      Haskel Schroeder, PA-C 12/07/20 2002    Rozelle Logan, DO 12/08/20 0013

## 2020-12-07 NOTE — ED Triage Notes (Signed)
C/O migraines unable to get relief from RX imitrex

## 2020-12-07 NOTE — ED Notes (Signed)
Pt stated she wasn't able to wait any longer and she was going home, moving pt OTF.

## 2020-12-08 ENCOUNTER — Emergency Department (HOSPITAL_COMMUNITY)
Admission: EM | Admit: 2020-12-08 | Discharge: 2020-12-08 | Disposition: A | Discharge status: Home / Self Care | Payer: Medicare PPO | Source: Home / Self Care | Attending: Emergency Medicine | Admitting: Emergency Medicine

## 2020-12-08 ENCOUNTER — Other Ambulatory Visit: Payer: Self-pay

## 2020-12-08 ENCOUNTER — Encounter (HOSPITAL_COMMUNITY): Payer: Self-pay

## 2020-12-08 DIAGNOSIS — G43901 Migraine, unspecified, not intractable, with status migrainosus: Secondary | ICD-10-CM | POA: Insufficient documentation

## 2020-12-08 DIAGNOSIS — J4530 Mild persistent asthma, uncomplicated: Secondary | ICD-10-CM | POA: Insufficient documentation

## 2020-12-08 DIAGNOSIS — Z7951 Long term (current) use of inhaled steroids: Secondary | ICD-10-CM | POA: Insufficient documentation

## 2020-12-08 DIAGNOSIS — G43001 Migraine without aura, not intractable, with status migrainosus: Secondary | ICD-10-CM

## 2020-12-08 DIAGNOSIS — I10 Essential (primary) hypertension: Secondary | ICD-10-CM | POA: Insufficient documentation

## 2020-12-08 DIAGNOSIS — Z79899 Other long term (current) drug therapy: Secondary | ICD-10-CM | POA: Insufficient documentation

## 2020-12-08 MED ORDER — METOCLOPRAMIDE HCL 5 MG/ML IJ SOLN
10.0000 mg | Freq: Once | INTRAMUSCULAR | Status: AC
  Administered 2020-12-08: 10 mg via INTRAVENOUS
  Filled 2020-12-08: qty 2

## 2020-12-08 MED ORDER — DIPHENHYDRAMINE HCL 50 MG/ML IJ SOLN
25.0000 mg | Freq: Once | INTRAMUSCULAR | Status: AC
  Administered 2020-12-08: 25 mg via INTRAVENOUS
  Filled 2020-12-08: qty 1

## 2020-12-08 MED ORDER — KETOROLAC TROMETHAMINE 30 MG/ML IJ SOLN
30.0000 mg | Freq: Once | INTRAMUSCULAR | Status: AC
  Administered 2020-12-08: 30 mg via INTRAVENOUS
  Filled 2020-12-08: qty 1

## 2020-12-08 NOTE — ED Triage Notes (Signed)
Pt arrived via POV, c/o migraine x3 days. No relief with imitrex.

## 2020-12-08 NOTE — Discharge Instructions (Addendum)
Follow-up with your family doctor if any problems 

## 2020-12-08 NOTE — ED Provider Notes (Signed)
[facility]-EMERGENCY DEPT Provider Note   CSN: [id number] Arrival date & time: 12/08/20  1324     History Chief Complaint  Patient presents with   Migraine    Haley Singh is a 70 y.o. female.  Patient has a history of migraines.  She has not had one in a number years.  She started with a migraine a few days ago and it is not improving.  Patient has some nausea  The history is provided by the patient and medical records. No language interpreter was used.  Migraine This is a new problem. The current episode started 2 days ago. The problem occurs constantly. The problem has not changed since onset.Pertinent negatives include no chest pain, no abdominal pain and no headaches. Nothing aggravates the symptoms. Nothing relieves the symptoms. She has tried nothing for the symptoms.      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   Hyperlipidemia    Hypertension    followed by pcp  (11-10-2019 pt stated never had a stress test)   Insomnia    MDD (major depressive disorder)    Mild persistent asthma    followed by pcp and dr Belva Crome (pulmonologist)   RLS (restless legs syndrome)     Patient Active Problem List   Diagnosis Date Noted   Mild persistent asthma without complication 05/17/2016   OSA (obstructive sleep apnea) 03/03/2014    Past Surgical History:  Procedure Laterality Date   CATARACT EXTRACTION W/ INTRAOCULAR LENS  IMPLANT, BILATERAL  2009; 2010   CYSTOSCOPY/URETEROSCOPY/HOLMIUM LASER/STENT PLACEMENT Right 08/26/2019   Procedure: CYSTOSCOPY/RETROGRADE/URETEROSCOPY/HOLMIUM LASER/STENT PLACEMENT;  Surgeon: Rene Paci, MD;  Location: [facility];  Service: Urology;  Laterality: Right;    CYSTOSCOPY/URETEROSCOPY/HOLMIUM LASER/STENT PLACEMENT Left 11/17/2019   Procedure: CYSTOSCOPY/URETEROSCOPY, RETROGRADE,LASER LITHOTRIPSY, STENT PLACEMENT;  Surgeon: Rene Paci, MD;  Location[facility];  Service: Urology;  Laterality: Left;   EXPLORATORY LAPAROTOMY W/ BOWEL RESECTION  01-15-2004  @[facility]    small bowel resection for stricture/ benzoar   EXTRACORPOREAL SHOCK WAVE LITHOTRIPSY Right 03/27/2018   Procedure: EXTRACORPOREAL SHOCK WAVE LITHOTRIPSY (ESWL);  Surgeon: 03/29/2018, MD;  Location: [facility];  Service: Urology;  Laterality: Right;   EXTRACORPOREAL SHOCK WAVE LITHOTRIPSY  x2 2008;  2009;  2015   HOLMIUM LASER APPLICATION Left 11/17/2019   Procedure: HOLMIUM LASER APPLICATION;  Surgeon: 11/19/2019, MD;  Location: [facility];  Service: Urology;  Laterality: Left;   ORIF ANKLE FRACTURE Right 09/ 2008 @[facility]    per pt has retained hardware   TONSILLECTOMY  age 65     OB History   No obstetric history on file.     Family History  Problem Relation Age of Onset   Breast cancer Maternal Aunt    Heart disease Father    Heart Problems Father     Social History   Tobacco Use   Smoking status: Never   Smokeless tobacco: Never  Vaping Use   Vaping Use: Never used  Substance Use Topics   Alcohol use: Yes    Alcohol/week: 7.0 standard drinks    Types: 7 Standard drinks or equivalent per week    Comment: scotch or wine  maybe 1 a night   Drug use: Never    Home Medications Prior to Admission medications   Medication Sig Start Date End Date Taking? Authorizing Provider  albuterol (PROVENTIL HFA;VENTOLIN HFA) 108 (90 BASE) MCG/ACT inhaler Inhale 1 puff into the lungs every 6 (six) hours as needed for wheezing or shortness of breath.    [provider]  amLODipine (NORVASC) 5 MG tablet Take 5 mg by mouth daily.     [provider]  atorvastatin (LIPITOR) 40 MG tablet Take 40 mg by mouth daily.      [provider]  BREO ELLIPTA 100-25 MCG/INH AEPB INHALE 1 PUFFS INTO THE LUNGS ONCE DAILY 08/04/20   Mannam, Colbert Coyer, MD  celecoxib (CELEBREX) 200 MG capsule Take 400 mg by mouth daily.     [provider]  cetirizine (ZYRTEC) 10 MG tablet Take 10 mg by mouth daily.    [provider]  cyclobenzaprine (FLEXERIL) 10 MG tablet Take 10 mg by mouth 3 (three) times daily as needed for muscle spasms.    [provider]  fluticasone (FLONASE) 50 MCG/ACT nasal spray Place 1 spray into both nostrils 2 (two) times daily.     [provider]  gabapentin (NEURONTIN) 100 MG capsule Take 200 mg by mouth at bedtime.     [provider]  losartan (COZAAR) 50 MG tablet Take 50 mg by mouth daily.  04/25/18   [provider]  montelukast (SINGULAIR) 10 MG tablet Take 10 mg by mouth daily.     [provider]  ondansetron (ZOFRAN ODT) 8 MG disintegrating tablet Take 1 tablet (8 mg total) by mouth every 8 (eight) hours as needed. 08/12/19   Zadie Rhine, MD  oxybutynin (DITROPAN) 5 MG tablet Take 1 tablet (5 mg total) by mouth every 8 (eight) hours as needed for bladder spasms. 11/17/19   Rene Paci, MD  traZODone (DESYREL) 150 MG tablet Take 150 mg by mouth at bedtime.     [provider]  venlafaxine XR (EFFEXOR-XR) 150 MG 24 hr capsule Take 150 mg by mouth every morning.     [provider]    Allergies    Oxycontin [oxycodone], Penicillins, and Tetracyclines & related  Review of Systems   Review of Systems  Constitutional:  Negative for appetite change and fatigue.  HENT:  Negative for congestion, ear discharge and sinus pressure.   Eyes:  Negative for discharge.  Respiratory:  Negative for cough.   Cardiovascular:  Negative for chest pain.  Gastrointestinal:  Negative for abdominal pain and diarrhea.  Genitourinary:  Negative for frequency and hematuria.  Musculoskeletal:  Negative for back pain.   Skin:  Negative for rash.  Neurological:  Negative for seizures and headaches.       Headache  Psychiatric/Behavioral:  Negative for hallucinations.    Physical Exam Updated Vital Signs BP (!) 148/91   Pulse 67   Temp 98.7 F (37.1 C) (Oral)   Resp 20   SpO2 95%   Physical Exam Vitals and nursing note reviewed.  Constitutional:      Appearance: She is well-developed.  HENT:     Head: Normocephalic.     Nose: Nose normal.  Eyes:     General: No scleral icterus.    Conjunctiva/sclera: Conjunctivae normal.  Neck:     Thyroid: No thyromegaly.  Cardiovascular:     Rate and Rhythm: Normal rate and regular rhythm.     Heart sounds: No murmur heard.  No friction rub. No gallop.  Pulmonary:     Breath sounds: No stridor. No wheezing or rales.  Chest:     Chest wall: No tenderness.  Abdominal:     General: There is no distension.     Tenderness: There is no abdominal tenderness. There is no rebound.  Musculoskeletal:        General: Normal range of motion.     Cervical back: Neck supple.  Lymphadenopathy:     Cervical: No cervical adenopathy.  Skin:    Findings: No erythema or rash.  Neurological:     Mental Status: She is alert and oriented to person, place, and time.     Motor: No abnormal muscle tone.     Coordination: Coordination normal.  Psychiatric:        Behavior: Behavior normal.    ED Results / Procedures / Treatments   Labs (all labs ordered are listed, but only abnormal results are displayed) Labs Reviewed - No data to display  EKG None  Radiology No results found.  Procedures Procedures   Medications Ordered in ED Medications  ketorolac (TORADOL) 30 MG/ML injection 30 mg (30 mg Intravenous Given 12/08/20 0726)  diphenhydrAMINE (BENADRYL) injection 25 mg (25 mg Intravenous Given 12/08/20 0726)  metoCLOPramide (REGLAN) injection 10 mg (10 mg Intravenous Given 12/08/20 0726)    ED Course  I have reviewed the triage vital signs and the nursing  notes.  Pertinent labs & imaging results that were available during my care of the patient were reviewed by me and considered in my medical decision making (see chart for details).    MDM Rules/Calculators/A&P                          Patient with migraine headache that was relieved with a migraine cocktail.  She will follow-up with her primary care doctor as needed Final Clinical Impression(s) / ED Diagnoses Final diagnoses:  Migraine without aura and with status migrainosus, not intractable    Rx / DC Orders ED Discharge Orders     None        Bethann Berkshire, MD 12/08/20 [phone]

## 2020-12-16 ENCOUNTER — Other Ambulatory Visit: Payer: Self-pay

## 2020-12-16 ENCOUNTER — Emergency Department (HOSPITAL_COMMUNITY)
Admission: EM | Admit: 2020-12-16 | Discharge: 2020-12-16 | Disposition: A | Discharge status: Home / Self Care | Payer: Medicare PPO | Attending: Emergency Medicine | Admitting: Emergency Medicine

## 2020-12-16 ENCOUNTER — Emergency Department (HOSPITAL_COMMUNITY): Payer: Medicare PPO

## 2020-12-16 ENCOUNTER — Encounter (HOSPITAL_COMMUNITY): Payer: Self-pay

## 2020-12-16 DIAGNOSIS — R519 Headache, unspecified: Secondary | ICD-10-CM

## 2020-12-16 DIAGNOSIS — J4530 Mild persistent asthma, uncomplicated: Secondary | ICD-10-CM | POA: Insufficient documentation

## 2020-12-16 DIAGNOSIS — I10 Essential (primary) hypertension: Secondary | ICD-10-CM | POA: Diagnosis not present

## 2020-12-16 DIAGNOSIS — Z20822 Contact with and (suspected) exposure to covid-19: Secondary | ICD-10-CM | POA: Diagnosis not present

## 2020-12-16 DIAGNOSIS — Z7951 Long term (current) use of inhaled steroids: Secondary | ICD-10-CM | POA: Insufficient documentation

## 2020-12-16 DIAGNOSIS — G43909 Migraine, unspecified, not intractable, without status migrainosus: Secondary | ICD-10-CM | POA: Insufficient documentation

## 2020-12-16 DIAGNOSIS — Z79899 Other long term (current) drug therapy: Secondary | ICD-10-CM | POA: Diagnosis not present

## 2020-12-16 LAB — COMPREHENSIVE METABOLIC PANEL
ALT: 22 U/L (ref 0–44)
AST: 17 U/L (ref 15–41)
Albumin: 3.7 g/dL (ref 3.5–5.0)
Alkaline Phosphatase: 94 U/L (ref 38–126)
Anion gap: 11 (ref 5–15)
BUN: 12 mg/dL (ref 8–23)
CO2: 23 mmol/L (ref 22–32)
Calcium: 8.5 mg/dL — ABNORMAL LOW (ref 8.9–10.3)
Chloride: 100 mmol/L (ref 98–111)
Creatinine, Ser: 0.91 mg/dL (ref 0.44–1.00)
GFR, Estimated: >60 mL/min (ref >60)
Glucose, Bld: 155 mg/dL — ABNORMAL HIGH (ref 70–99)
Potassium: 3 mmol/L — ABNORMAL LOW (ref 3.5–5.1)
Sodium: 134 mmol/L — ABNORMAL LOW (ref 135–145)
Total Bilirubin: 1.8 mg/dL — ABNORMAL HIGH (ref 0.3–1.2)
Total Protein: 7.4 g/dL (ref 6.5–8.1)

## 2020-12-16 LAB — CBC WITH DIFFERENTIAL/PLATELET
Abs Immature Granulocytes: 0.13 10*3/uL — ABNORMAL HIGH (ref 0.00–0.07)
Basophils Absolute: 0 10*3/uL (ref 0.0–0.1)
Basophils Relative: 0 %
Eosinophils Absolute: 0 10*3/uL (ref 0.0–0.5)
Eosinophils Relative: 0 %
HCT: 41.5 % (ref 36.0–46.0)
Hemoglobin: 13.9 g/dL (ref 12.0–15.0)
Immature Granulocytes: 1 %
Lymphocytes Relative: 5 %
Lymphs Abs: 0.8 10*3/uL (ref 0.7–4.0)
MCH: 30.3 pg (ref 26.0–34.0)
MCHC: 33.5 g/dL (ref 30.0–36.0)
MCV: 90.6 fL (ref 80.0–100.0)
Monocytes Absolute: 1.3 10*3/uL — ABNORMAL HIGH (ref 0.1–1.0)
Monocytes Relative: 8 %
Neutro Abs: 13.3 10*3/uL — ABNORMAL HIGH (ref 1.7–7.7)
Neutrophils Relative %: 86 %
Platelets: 251 10*3/uL (ref 150–400)
RBC: 4.58 MIL/uL (ref 3.87–5.11)
RDW: 13.6 % (ref 11.5–15.5)
WBC: 15.5 10*3/uL — ABNORMAL HIGH (ref 4.0–10.5)
nRBC: 0 % (ref 0.0–0.2)

## 2020-12-16 LAB — CBG MONITORING, ED: Glucose-Capillary: 139 mg/dL — ABNORMAL HIGH (ref 70–99)

## 2020-12-16 LAB — RESP PANEL BY RT-PCR (FLU A&B, COVID) ARPGX2
Influenza A by PCR: NEGATIVE
Influenza B by PCR: NEGATIVE
SARS Coronavirus 2 by RT PCR: NEGATIVE

## 2020-12-16 LAB — SEDIMENTATION RATE: Sed Rate: 23 mm/hr — ABNORMAL HIGH (ref 0–22)

## 2020-12-16 MED ORDER — VALPROATE SODIUM 100 MG/ML IV SOLN
750.0000 mg | Freq: Once | INTRAVENOUS | Status: AC
  Administered 2020-12-16: 750 mg via INTRAVENOUS
  Filled 2020-12-16: qty 7.5

## 2020-12-16 MED ORDER — DIPHENHYDRAMINE HCL 50 MG/ML IJ SOLN
25.0000 mg | Freq: Once | INTRAMUSCULAR | Status: AC
  Administered 2020-12-16: 25 mg via INTRAVENOUS
  Filled 2020-12-16: qty 1

## 2020-12-16 MED ORDER — DEXAMETHASONE SODIUM PHOSPHATE 10 MG/ML IJ SOLN
10.0000 mg | Freq: Once | INTRAMUSCULAR | Status: AC
  Administered 2020-12-16: 10 mg via INTRAVENOUS
  Filled 2020-12-16: qty 1

## 2020-12-16 MED ORDER — METOCLOPRAMIDE HCL 5 MG/ML IJ SOLN
10.0000 mg | Freq: Once | INTRAMUSCULAR | Status: AC
  Administered 2020-12-16: 10 mg via INTRAVENOUS
  Filled 2020-12-16: qty 2

## 2020-12-16 MED ORDER — POTASSIUM CHLORIDE CRYS ER 20 MEQ PO TBCR
40.0000 meq | EXTENDED_RELEASE_TABLET | Freq: Once | ORAL | Status: AC
  Administered 2020-12-16: 40 meq via ORAL
  Filled 2020-12-16: qty 2

## 2020-12-16 MED ORDER — KETOROLAC TROMETHAMINE 30 MG/ML IJ SOLN
30.0000 mg | Freq: Once | INTRAMUSCULAR | Status: AC
  Administered 2020-12-16: 30 mg via INTRAVENOUS
  Filled 2020-12-16: qty 1

## 2020-12-16 NOTE — Discharge Instructions (Addendum)
Your CT scan is normal.  Follow-up with your primary doctor as well as neurologist for further evaluation of your headaches.  You declined lumbar puncture today.  Return to the ED if you develop worsening symptoms including headache, fever, persistent vomiting, difficulty with weakness, numbness, tingling, or any other concerns.

## 2020-12-16 NOTE — ED Triage Notes (Signed)
Patient arrived via POV. Migraine for 2 days. Patient did not take any medications for it yet. Patient feels nauseous but denies vomiting. No chest pain, but mild dizziness.

## 2020-12-16 NOTE — ED Provider Notes (Signed)
[facility]-EMERGENCY DEPT Provider Note   CSN: [id number] Arrival date & time: 12/16/20  0031     History Chief Complaint  Patient presents with   Migraine    Haley Singh is a 70 y.o. female.  Patient with history of "migraine headache" progressively worsening headache over the past 2 days.  States feels like typical migraines.  She is prescribed Imitrex at home and Phenergan but not take it because it "does not work".  She was seen in the ED for similar headache on July 7 and feels improved.  States she did not have a headache again until couple days ago.  She has had nausea but no vomiting.  Photophobia and phonophobia.  No focal weakness, numbness or tingling.  No difficulty speaking or difficulty swallowing.  No chest pain or shortness of breath.  Denies any change in her vision.  Headache is gradual in onset and feels similar to previous migraine.  No thunderclap.  She saw her PCP on July 12 and was referred to neurologist as an MRI. Before last week she denied had a migraine headache for several years.  States she felt better when she left here but headache returned again a couple days ago.  The history is provided by the patient.  Migraine Associated symptoms include headaches. Pertinent negatives include no abdominal pain and no shortness of breath.      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 mannam (pulmonologist)   RLS (restless legs syndrome)     Patient Active Problem List   Diagnosis Date Noted   Mild persistent  asthma without complication 05/17/2016   OSA (obstructive sleep apnea) 03/03/2014    Past Surgical History:  Procedure Laterality Date   CATARACT EXTRACTION W/ INTRAOCULAR LENS  IMPLANT, BILATERAL  2009; 2010   CYSTOSCOPY/URETEROSCOPY/HOLMIUM LASER/STENT PLACEMENT Right 08/26/2019   Procedure: CYSTOSCOPY/RETROGRADE/URETEROSCOPY/HOLMIUM LASER/STENT PLACEMENT;  Surgeon: Winter, Christopher Aaron, MD;  Location: [facility];  Service: Urology;  Laterality: Right;   CYSTOSCOPY/URETEROSCOPY/HOLMIUM LASER/STENT PLACEMENT Left 11/17/2019   Procedure: CYSTOSCOPY/URETEROSCOPY, RETROGRADE,LASER LITHOTRIPSY, STENT PLACEMENT;  Surgeon: Winter, Christopher Aaron, MD;  Location: [facility];  Service: Urology;  Laterality: Left;   EXPLORATORY LAPAROTOMY W/ BOWEL RESECTION  01-15-2004  @[facility]   small bowel resection for stricture/ benzoar   EXTRACORPOREAL SHOCK WAVE LITHOTRIPSY Right 03/27/2018   Procedure: EXTRACORPOREAL SHOCK WAVE LITHOTRIPSY (ESWL);  Surgeon: Herrick, Benjamin W, MD;  Location: [facility];  Service: Urology;  Laterality: Right;   EXTRACORPOREAL SHOCK WAVE LITHOTRIPSY  x2 2008;  2009;  2015   HOLMIUM LASER APPLICATION Left 11/17/2019   Procedure: HOLMIUM LASER APPLICATION;  Surgeon: Winter, Christopher Aaron, MD;  Location: [facility];  Service: Urology;  Laterality: Left;   ORIF ANKLE FRACTURE Right 09/ 2008 @[facility]   per pt has retained hardware   TONSILLECTOMY  age 10     OB History   No obstetric history on file.       Family History  Problem Relation Age of Onset   Breast cancer Maternal Aunt    Heart disease Father    Heart Problems Father     Social History   Tobacco Use   Smoking status: Never   Smokeless tobacco: Never  Vaping Use   Vaping Use: Never used  Substance Use Topics   Alcohol use: Yes    Alcohol/week: 7.0 standard drinks    Types: 7 Standard drinks or equivalent per week    Comment: scotch or wine maybe 1 a night    Drug use: Never    Home Medications Prior to Admission medications   Medication Sig Start Date End Date Taking? Authorizing Provider  albuterol (PROVENTIL HFA;VENTOLIN HFA) 108 (90 BASE) MCG/ACT inhaler Inhale 1 puff into the lungs every 6 (six) hours as needed for wheezing or shortness of breath.    [provider]  amLODipine (NORVASC) 5 MG tablet Take 5 mg by mouth daily.     [provider]  atorvastatin (LIPITOR) 40 MG tablet Take 40 mg by mouth daily.     [provider]  BREO ELLIPTA 100-25 MCG/INH AEPB INHALE 1 PUFFS INTO THE LUNGS ONCE DAILY 08/04/20   Mannam, Praveen, MD  celecoxib (CELEBREX) 200 MG capsule Take 400 mg by mouth daily.     [provider]  cetirizine (ZYRTEC) 10 MG tablet Take 10 mg by mouth daily.    [provider]  cyclobenzaprine (FLEXERIL) 10 MG tablet Take 10 mg by mouth 3 (three) times daily as needed for muscle spasms.    [provider]  fluticasone (FLONASE) 50 MCG/ACT nasal spray Place 1 spray into both nostrils 2 (two) times daily.     [provider]  gabapentin (NEURONTIN) 100 MG capsule Take 200 mg by mouth at bedtime.     [provider]  losartan (COZAAR) 50 MG tablet Take 50 mg by mouth daily.  04/25/18   [provider]  montelukast (SINGULAIR) 10 MG tablet Take 10 mg by mouth daily.     [provider]  ondansetron (ZOFRAN ODT) 8 MG disintegrating tablet Take 1 tablet (8 mg total) by mouth every 8 (eight) hours as needed. 08/12/19   Wickline, Donald, MD  oxybutynin (DITROPAN) 5 MG tablet Take 1 tablet (5 mg total) by mouth every 8 (eight) hours as needed for bladder spasms. 11/17/19   Winter, Christopher Aaron, MD  traZODone (DESYREL) 150 MG tablet Take 150 mg by mouth at bedtime.     [provider]  venlafaxine XR (EFFEXOR-XR) 150 MG 24 hr capsule Take 150 mg by mouth every morning.     [provider]    Allergies    Oxycontin [oxycodone],  Penicillins, and Tetracyclines & related  Review of Systems   Review of Systems  Constitutional:  Negative for activity change, appetite change and fever.  HENT:  Negative for congestion and rhinorrhea.   Eyes:  Positive for photophobia and visual disturbance.  Respiratory:  Negative for cough, chest tightness and shortness of breath.   Gastrointestinal:  Positive for nausea. Negative for abdominal pain and vomiting.  Genitourinary:  Negative for dysuria and hematuria.  Musculoskeletal:  Negative for arthralgias and myalgias.  Skin:  Negative for wound.  Neurological:  Positive for light-headedness and headaches. Negative for dizziness and weakness.   all other systems are negative except as noted in the HPI and PMH.   Physical Exam Updated Vital Signs BP 133/65   Pulse 86     Temp 98.1 F (36.7 C) (Oral)   Resp 19   Ht 5' 6" (1.676 m)   Wt 93.9 kg   SpO2 94%   BMI 33.41 kg/m   Physical Exam Vitals and nursing note reviewed.  Constitutional:      General: She is not in acute distress.    Appearance: She is well-developed.  HENT:     Head: Normocephalic and atraumatic.     Comments: No temporal artery tenderness    Mouth/Throat:     Pharynx: No oropharyngeal exudate.  Eyes:     Conjunctiva/sclera: Conjunctivae normal.     Pupils: Pupils are equal, round, and reactive to light.     Comments: Photophobic  Neck:     Comments: No meningismus. Cardiovascular:     Rate and Rhythm: Normal rate and regular rhythm.     Heart sounds: Normal heart sounds. No murmur heard. Pulmonary:     Effort: Pulmonary effort is normal. No respiratory distress.     Breath sounds: Normal breath sounds.  Abdominal:     Palpations: Abdomen is soft.     Tenderness: There is no abdominal tenderness. There is no guarding or rebound.  Musculoskeletal:        General: No tenderness. Normal range of motion.     Cervical back: Normal range of motion and neck supple.  Skin:    General: Skin is warm.   Neurological:     Mental Status: She is alert and oriented to person, place, and time.     Cranial Nerves: No cranial nerve deficit.     Motor: No abnormal muscle tone.     Coordination: Coordination normal.     Comments: CN 2-12 intact, no ataxia on finger to nose, no nystagmus, 5/5 strength throughout, no pronator drift, Romberg negative, normal gait.   Psychiatric:        Behavior: Behavior normal.    ED Results / Procedures / Treatments   Labs (all labs ordered are listed, but only abnormal results are displayed) Labs Reviewed  CBC WITH DIFFERENTIAL/PLATELET - Abnormal; Notable for the following components:      Result Value   WBC 15.5 (*)    Neutro Abs 13.3 (*)    Monocytes Absolute 1.3 (*)    Abs Immature Granulocytes 0.13 (*)    All other components within normal limits  COMPREHENSIVE METABOLIC PANEL - Abnormal; Notable for the following components:   Sodium 134 (*)    Potassium 3.0 (*)    Glucose, Bld 155 (*)    Calcium 8.5 (*)    Total Bilirubin 1.8 (*)    All other components within normal limits  SEDIMENTATION RATE - Abnormal; Notable for the following components:   Sed Rate 23 (*)    All other components within normal limits  CBG MONITORING, ED - Abnormal; Notable for the following components:   Glucose-Capillary 139 (*)    All other components within normal limits  RESP PANEL BY RT-PCR (FLU A&B, COVID) ARPGX2  URINALYSIS, ROUTINE W REFLEX MICROSCOPIC    EKG None  Radiology CT Head Wo Contrast  Result Date: 12/16/2020 CLINICAL DATA:  70-year-old female with headache. EXAM: CT HEAD WITHOUT CONTRAST TECHNIQUE: Contiguous axial images were obtained from the base of the skull through the vertex without intravenous contrast. COMPARISON:  None. FINDINGS: Brain: The ventricles and sulci are appropriate size for patient's age. The gray-white matter discrimination is preserved. There is no acute intracranial hemorrhage. No mass effect midline shift. No extra-axial  fluid   collection. Vascular: No hyperdense vessel or unexpected calcification. Skull: Normal. Negative for fracture or focal lesion. Sinuses/Orbits: There is complete opacification of the sphenoid sinuses with remodeling of the bone. No destructive changes or expansion. There is opacification of several ethmoid air cells. No air-fluid level. There is a 2 cm right maxillary sinus retention cyst or polyp. The mastoid air cells are clear. Other: None IMPRESSION: 1. Unremarkable noncontrast CT of the brain. 2. Chronic paranasal sinus disease. Electronically Signed   By: Anner Crete M.D.   On: 12/16/2020 02:20    Procedures Procedures   Medications Ordered in ED Medications  ketorolac (TORADOL) 30 MG/ML injection 30 mg (has no administration in time range)  metoCLOPramide (REGLAN) injection 10 mg (has no administration in time range)  diphenhydrAMINE (BENADRYL) injection 25 mg (has no administration in time range)    ED Course  I have reviewed the triage vital signs and the nursing notes.  Pertinent labs & imaging results that were available during my care of the patient were reviewed by me and considered in my medical decision making (see chart for details).    MDM Rules/Calculators/A&P                         Gradual onset headache with photophobia and nausea similar to previous migraines.  Neurological exam is intact. Denies thunderclap onset.   CT head obtained in triage is negative. No temporal artery tenderness.  Low suspicion for subarachnoid hemorrhage, meningitis, temporal arteritis. ESR 23.  Pain is improved with Toradol, Reglan and Benadryl.  Patient did have episode of feeling hot in the ED and temperature was found to be 100.1.  Headache has resolved after Depakote and steroids.  Does have leukocytosis but did receive steroids.  Patient feels improved and is anxious to go home.  Temperature has come down to 97.8.  States headache has resolved.  Unclear etiology of her near  fever.  Her COVID testing is negative.  Chest x-ray is negative.  She did not provide a urine sample.  Low suspicion for bacterial meningitis but discussed possibility with patient.  She declines lumbar puncture and states she wants to go home.  Discussed viral meningitis is a possibility but that would be treated similar to any other virus and do not favor bacterial meningitis at this time.  She understands meningitis cannot be ruled out without a lumbar puncture.  She appears to have capacity to make this decision and continues to decline lumbar puncture. She has no meningismus.  Discussed follow-up with PCP as well as neurology.  Return to the ED if her headache suddenly worsens, she develops persistent fever, weakness, numbness, tingling, difficulty breathing, persistent vomiting or any other concerns.  Haley Singh was evaluated in Emergency Department on 12/16/2020 for the symptoms described in the history of present illness. She was evaluated in the context of the global COVID-19 pandemic, which necessitated consideration that the patient might be at risk for infection with the SARS-CoV-2 virus that causes COVID-19. Institutional protocols and algorithms that pertain to the evaluation of patients at risk for COVID-19 are in a state of rapid change based on information released by regulatory bodies including the CDC and federal and state organizations. These policies and algorithms were followed during the patient's care in the ED.  Final Clinical Impression(s) / ED Diagnoses Final diagnoses:  Bad headache    Rx / DC Orders ED Discharge Orders     None  Rancour, Stephen, MD 12/16/20 0713  

## 2021-01-17 ENCOUNTER — Emergency Department (HOSPITAL_COMMUNITY): Payer: Medicare PPO

## 2021-01-17 ENCOUNTER — Other Ambulatory Visit: Payer: Self-pay

## 2021-01-17 ENCOUNTER — Inpatient Hospital Stay (HOSPITAL_COMMUNITY): Payer: Medicare PPO

## 2021-01-17 ENCOUNTER — Encounter (HOSPITAL_COMMUNITY): Payer: Self-pay | Admitting: Radiology

## 2021-01-17 ENCOUNTER — Inpatient Hospital Stay (HOSPITAL_COMMUNITY)
Admission: EM | Admit: 2021-01-17 | Discharge: 2021-01-27 | DRG: 872 | Disposition: A | Discharge status: Skilled Nursing Facility | Payer: Medicare PPO | Attending: Internal Medicine | Admitting: Internal Medicine

## 2021-01-17 DIAGNOSIS — M4646 Discitis, unspecified, lumbar region: Secondary | ICD-10-CM | POA: Diagnosis present

## 2021-01-17 DIAGNOSIS — J4530 Mild persistent asthma, uncomplicated: Secondary | ICD-10-CM | POA: Diagnosis not present

## 2021-01-17 DIAGNOSIS — K5900 Constipation, unspecified: Secondary | ICD-10-CM | POA: Diagnosis present

## 2021-01-17 DIAGNOSIS — E785 Hyperlipidemia, unspecified: Secondary | ICD-10-CM | POA: Diagnosis not present

## 2021-01-17 DIAGNOSIS — B9561 Methicillin susceptible Staphylococcus aureus infection as the cause of diseases classified elsewhere: Secondary | ICD-10-CM

## 2021-01-17 DIAGNOSIS — R7881 Bacteremia: Secondary | ICD-10-CM

## 2021-01-17 DIAGNOSIS — E876 Hypokalemia: Secondary | ICD-10-CM | POA: Diagnosis present

## 2021-01-17 DIAGNOSIS — Z7951 Long term (current) use of inhaled steroids: Secondary | ICD-10-CM

## 2021-01-17 DIAGNOSIS — M4626 Osteomyelitis of vertebra, lumbar region: Secondary | ICD-10-CM | POA: Diagnosis not present

## 2021-01-17 DIAGNOSIS — Z9049 Acquired absence of other specified parts of digestive tract: Secondary | ICD-10-CM

## 2021-01-17 DIAGNOSIS — Z79899 Other long term (current) drug therapy: Secondary | ICD-10-CM

## 2021-01-17 DIAGNOSIS — F411 Generalized anxiety disorder: Secondary | ICD-10-CM | POA: Diagnosis not present

## 2021-01-17 DIAGNOSIS — J449 Chronic obstructive pulmonary disease, unspecified: Secondary | ICD-10-CM | POA: Diagnosis present

## 2021-01-17 DIAGNOSIS — D72828 Other elevated white blood cell count: Secondary | ICD-10-CM | POA: Diagnosis present

## 2021-01-17 DIAGNOSIS — G629 Polyneuropathy, unspecified: Secondary | ICD-10-CM | POA: Diagnosis present

## 2021-01-17 DIAGNOSIS — F32A Depression, unspecified: Secondary | ICD-10-CM | POA: Diagnosis not present

## 2021-01-17 DIAGNOSIS — Z20822 Contact with and (suspected) exposure to covid-19: Secondary | ICD-10-CM | POA: Diagnosis not present

## 2021-01-17 DIAGNOSIS — M869 Osteomyelitis, unspecified: Secondary | ICD-10-CM

## 2021-01-17 DIAGNOSIS — M545 Low back pain, unspecified: Secondary | ICD-10-CM

## 2021-01-17 DIAGNOSIS — G2581 Restless legs syndrome: Secondary | ICD-10-CM | POA: Diagnosis not present

## 2021-01-17 DIAGNOSIS — A4101 Sepsis due to Methicillin susceptible Staphylococcus aureus: Secondary | ICD-10-CM | POA: Diagnosis not present

## 2021-01-17 DIAGNOSIS — Q211 Atrial septal defect: Secondary | ICD-10-CM | POA: Diagnosis not present

## 2021-01-17 DIAGNOSIS — M868X8 Other osteomyelitis, other site: Secondary | ICD-10-CM | POA: Diagnosis not present

## 2021-01-17 DIAGNOSIS — I10 Essential (primary) hypertension: Secondary | ICD-10-CM | POA: Diagnosis present

## 2021-01-17 DIAGNOSIS — M549 Dorsalgia, unspecified: Secondary | ICD-10-CM

## 2021-01-17 LAB — CBC WITH DIFFERENTIAL/PLATELET
Abs Immature Granulocytes: 0.09 10*3/uL — ABNORMAL HIGH (ref 0.00–0.07)
Basophils Absolute: 0 10*3/uL (ref 0.0–0.1)
Basophils Relative: 0 %
Eosinophils Absolute: 0 10*3/uL (ref 0.0–0.5)
Eosinophils Relative: 0 %
HCT: 34.4 % — ABNORMAL LOW (ref 36.0–46.0)
Hemoglobin: 11.1 g/dL — ABNORMAL LOW (ref 12.0–15.0)
Immature Granulocytes: 1 %
Lymphocytes Relative: 4 %
Lymphs Abs: 0.5 10*3/uL — ABNORMAL LOW (ref 0.7–4.0)
MCH: 29.4 pg (ref 26.0–34.0)
MCHC: 32.3 g/dL (ref 30.0–36.0)
MCV: 91 fL (ref 80.0–100.0)
Monocytes Absolute: 0.8 10*3/uL (ref 0.1–1.0)
Monocytes Relative: 7 %
Neutro Abs: 11.2 10*3/uL — ABNORMAL HIGH (ref 1.7–7.7)
Neutrophils Relative %: 88 %
Platelets: 293 10*3/uL (ref 150–400)
RBC: 3.78 MIL/uL — ABNORMAL LOW (ref 3.87–5.11)
RDW: 14.4 % (ref 11.5–15.5)
WBC: 12.6 10*3/uL — ABNORMAL HIGH (ref 4.0–10.5)
nRBC: 0 % (ref 0.0–0.2)

## 2021-01-17 LAB — COMPREHENSIVE METABOLIC PANEL
ALT: 15 U/L (ref 0–44)
AST: 13 U/L — ABNORMAL LOW (ref 15–41)
Albumin: 3 g/dL — ABNORMAL LOW (ref 3.5–5.0)
Alkaline Phosphatase: 94 U/L (ref 38–126)
Anion gap: 11 (ref 5–15)
BUN: 9 mg/dL (ref 8–23)
CO2: 26 mmol/L (ref 22–32)
Calcium: 8.5 mg/dL — ABNORMAL LOW (ref 8.9–10.3)
Chloride: 101 mmol/L (ref 98–111)
Creatinine, Ser: 0.8 mg/dL (ref 0.44–1.00)
GFR, Estimated: >60 mL/min (ref >60)
Glucose, Bld: 130 mg/dL — ABNORMAL HIGH (ref 70–99)
Potassium: 3.6 mmol/L (ref 3.5–5.1)
Sodium: 138 mmol/L (ref 135–145)
Total Bilirubin: 1 mg/dL (ref 0.3–1.2)
Total Protein: 8 g/dL (ref 6.5–8.1)

## 2021-01-17 LAB — C-REACTIVE PROTEIN: CRP: 19.9 mg/dL — ABNORMAL HIGH (ref <1.0)

## 2021-01-17 LAB — SEDIMENTATION RATE: Sed Rate: 114 mm/hr — ABNORMAL HIGH (ref 0–22)

## 2021-01-17 MED ORDER — ALBUTEROL SULFATE (2.5 MG/3ML) 0.083% IN NEBU: 2.5000 mg | INHALATION_SOLUTION | Freq: Four times a day (QID) | RESPIRATORY_TRACT | Status: DC | PRN

## 2021-01-17 MED ORDER — ACETAMINOPHEN 325 MG PO TABS
650.0000 mg | ORAL_TABLET | Freq: Four times a day (QID) | ORAL | Status: DC | PRN
  Administered 2021-01-17 – 2021-01-26 (×10): 650 mg via ORAL
  Filled 2021-01-17 (×12): qty 2

## 2021-01-17 MED ORDER — LORATADINE 10 MG PO TABS
10.0000 mg | ORAL_TABLET | Freq: Every day | ORAL | Status: DC
  Administered 2021-01-20 – 2021-01-27 (×8): 10 mg via ORAL
  Filled 2021-01-17 (×10): qty 1

## 2021-01-17 MED ORDER — SENNOSIDES-DOCUSATE SODIUM 8.6-50 MG PO TABS
1.0000 | ORAL_TABLET | Freq: Every evening | ORAL | Status: DC | PRN
  Administered 2021-01-17: 1 via ORAL
  Filled 2021-01-17: qty 1

## 2021-01-17 MED ORDER — ENOXAPARIN SODIUM 40 MG/0.4ML IJ SOSY
40.0000 mg | PREFILLED_SYRINGE | INTRAMUSCULAR | Status: DC
  Administered 2021-01-17 – 2021-01-27 (×11): 40 mg via SUBCUTANEOUS
  Filled 2021-01-17 (×11): qty 0.4

## 2021-01-17 MED ORDER — ATORVASTATIN CALCIUM 40 MG PO TABS
40.0000 mg | ORAL_TABLET | Freq: Every day | ORAL | Status: DC
  Administered 2021-01-17 – 2021-01-27 (×11): 40 mg via ORAL
  Filled 2021-01-17 (×11): qty 1

## 2021-01-17 MED ORDER — VENLAFAXINE HCL ER 150 MG PO CP24
150.0000 mg | ORAL_CAPSULE | Freq: Every morning | ORAL | Status: DC
  Administered 2021-01-18 – 2021-01-27 (×9): 150 mg via ORAL
  Filled 2021-01-17 (×6): qty 2

## 2021-01-17 MED ORDER — FLUTICASONE PROPIONATE 50 MCG/ACT NA SUSP
1.0000 | Freq: Two times a day (BID) | NASAL | Status: DC
  Administered 2021-01-17 – 2021-01-27 (×14): 1 via NASAL
  Filled 2021-01-17: qty 16

## 2021-01-17 MED ORDER — SUMATRIPTAN SUCCINATE 50 MG PO TABS
50.0000 mg | ORAL_TABLET | Freq: Every day | ORAL | Status: DC | PRN
  Filled 2021-01-17 (×2): qty 1

## 2021-01-17 MED ORDER — MORPHINE SULFATE (PF) 4 MG/ML IV SOLN
4.0000 mg | Freq: Once | INTRAVENOUS | Status: AC
  Administered 2021-01-17: 4 mg via INTRAMUSCULAR
  Filled 2021-01-17: qty 1

## 2021-01-17 MED ORDER — VANCOMYCIN HCL 1500 MG/300ML IV SOLN
1500.0000 mg | Freq: Once | INTRAVENOUS | Status: DC
  Administered 2021-01-17: 1500 mg via INTRAVENOUS
  Filled 2021-01-17: qty 300

## 2021-01-17 MED ORDER — ONDANSETRON HCL 4 MG PO TABS
4.0000 mg | ORAL_TABLET | Freq: Four times a day (QID) | ORAL | Status: DC | PRN
  Administered 2021-01-20 – 2021-01-23 (×2): 4 mg via ORAL
  Filled 2021-01-17 (×2): qty 1

## 2021-01-17 MED ORDER — ONDANSETRON HCL 4 MG/2ML IJ SOLN
4.0000 mg | Freq: Four times a day (QID) | INTRAMUSCULAR | Status: DC | PRN
  Administered 2021-01-18 – 2021-01-22 (×3): 4 mg via INTRAVENOUS
  Filled 2021-01-17 (×4): qty 2

## 2021-01-17 MED ORDER — AMLODIPINE BESYLATE 5 MG PO TABS
5.0000 mg | ORAL_TABLET | Freq: Every day | ORAL | Status: DC
  Administered 2021-01-17 – 2021-01-27 (×11): 5 mg via ORAL
  Filled 2021-01-17 (×11): qty 1

## 2021-01-17 MED ORDER — TRAZODONE HCL 50 MG PO TABS
150.0000 mg | ORAL_TABLET | Freq: Every day | ORAL | Status: DC
  Administered 2021-01-17 – 2021-01-27 (×11): 150 mg via ORAL
  Filled 2021-01-17 (×5): qty 2
  Filled 2021-01-17 (×2): qty 1
  Filled 2021-01-17 (×2): qty 2
  Filled 2021-01-17: qty 1
  Filled 2021-01-17: qty 2

## 2021-01-17 MED ORDER — MONTELUKAST SODIUM 10 MG PO TABS
10.0000 mg | ORAL_TABLET | Freq: Every day | ORAL | Status: DC
  Administered 2021-01-17 – 2021-01-27 (×11): 10 mg via ORAL
  Filled 2021-01-17 (×11): qty 1

## 2021-01-17 MED ORDER — GABAPENTIN 300 MG PO CAPS
600.0000 mg | ORAL_CAPSULE | Freq: Every day | ORAL | Status: DC
  Administered 2021-01-17 – 2021-01-27 (×11): 600 mg via ORAL
  Filled 2021-01-17 (×11): qty 2

## 2021-01-17 MED ORDER — CELECOXIB 200 MG PO CAPS
200.0000 mg | ORAL_CAPSULE | Freq: Every day | ORAL | Status: DC | PRN
  Administered 2021-01-17 – 2021-01-23 (×4): 400 mg via ORAL
  Administered 2021-01-25 – 2021-01-26 (×2): 200 mg via ORAL
  Administered 2021-01-27: 400 mg via ORAL
  Filled 2021-01-17 (×8): qty 2

## 2021-01-17 MED ORDER — FLUTICASONE FUROATE-VILANTEROL 100-25 MCG/INH IN AEPB
1.0000 | INHALATION_SPRAY | Freq: Every day | RESPIRATORY_TRACT | Status: DC
  Administered 2021-01-18 – 2021-01-27 (×9): 1 via RESPIRATORY_TRACT
  Filled 2021-01-17: qty 28

## 2021-01-17 MED ORDER — BISACODYL 5 MG PO TBEC
5.0000 mg | DELAYED_RELEASE_TABLET | Freq: Every day | ORAL | Status: DC | PRN
  Administered 2021-01-24: 5 mg via ORAL
  Filled 2021-01-17: qty 1

## 2021-01-17 MED ORDER — ACETAMINOPHEN 650 MG RE SUPP: 650.0000 mg | Freq: Four times a day (QID) | RECTAL | Status: DC | PRN

## 2021-01-17 MED ORDER — LOSARTAN POTASSIUM 50 MG PO TABS
50.0000 mg | ORAL_TABLET | Freq: Every day | ORAL | Status: DC
  Administered 2021-01-17 – 2021-01-27 (×11): 50 mg via ORAL
  Filled 2021-01-17 (×5): qty 2
  Filled 2021-01-17 (×3): qty 1
  Filled 2021-01-17 (×3): qty 2

## 2021-01-17 MED ORDER — HYDROMORPHONE HCL 1 MG/ML IJ SOLN
1.0000 mg | Freq: Once | INTRAMUSCULAR | Status: AC
  Administered 2021-01-17: 1 mg via INTRAVENOUS
  Filled 2021-01-17: qty 1

## 2021-01-17 MED ORDER — HYDROMORPHONE HCL 1 MG/ML IJ SOLN
0.5000 mg | INTRAMUSCULAR | Status: DC | PRN
  Administered 2021-01-17 – 2021-01-18 (×4): 1 mg via INTRAVENOUS
  Filled 2021-01-17 (×5): qty 1

## 2021-01-17 NOTE — Progress Notes (Signed)
Orthopedic Tech Progress Note Patient Details:  Haley Singh [birth year] [id number]  Patient ID: Haley Singh, female   DOB: 01/28/1951, 70 y.o.   MRN: [id number]  Haley Singh 01/17/2021, 5:12 PM TLSO ordered from [facility]

## 2021-01-17 NOTE — ED Notes (Signed)
Report given to floor nurse, Sondra Barges, RN.

## 2021-01-17 NOTE — ED Provider Notes (Addendum)
[facility] DEPT Provider Note   CSN: [id number] Arrival date & time: 01/17/21  2800     History Chief Complaint  Patient presents with   Back Pain    Haley Singh is a 70 y.o. female with PMH chronic low back pain, HLD, HTN, SBO, restless leg syndrome presents emergency department for evaluation of back pain.  Patient was recently seen by her orthopedist at the end of July who had concerns about lumbar stenosis and has an outpatient MRI scheduled.  She states that last night she had an acute onset worsening of her low back pain and has been unable to ambulate without significant pain.  She states that she is barely able to ambulate to the bathroom secondary to the pain and she has worsening numbness to the soles of bilateral feet.  She denies urinary retention, urinary incontinence, saddle anesthesia or true muscle weakness.  Denies chest pain, shortness of breath, abdominal pain, nausea, vomiting or other systemic symptoms.  Denies new trauma to the back.   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   Hyperlipidemia    Hypertension    followed by pcp  (11-10-2019 pt stated never had a stress test)   Insomnia    MDD (major depressive disorder)    Mild persistent asthma    followed by pcp and dr Bernita Raisin (pulmonologist)   RLS (restless legs syndrome)     Patient Active Problem List   Diagnosis Date Noted   Mild persistent asthma without complication 34/91/7915   OSA (obstructive sleep apnea) 03/03/2014    Past Surgical History:  Procedure Laterality Date   CATARACT EXTRACTION W/ INTRAOCULAR  LENS  IMPLANT, BILATERAL  2009; 2010   CYSTOSCOPY/URETEROSCOPY/HOLMIUM LASER/STENT PLACEMENT Right 08/26/2019   Procedure: CYSTOSCOPY/RETROGRADE/URETEROSCOPY/HOLMIUM LASER/STENT PLACEMENT;  Surgeon: Ceasar Mons, MD;  Location: [facility];  Service: Urology;  Laterality: Right;   CYSTOSCOPY/URETEROSCOPY/HOLMIUM LASER/STENT PLACEMENT Left 11/17/2019   Procedure: CYSTOSCOPY/URETEROSCOPY, RETROGRADE,LASER LITHOTRIPSY, STENT PLACEMENT;  Surgeon: Ceasar Mons, MD;  Location: [facility];  Service: Urology;  Laterality: Left;   EXPLORATORY LAPAROTOMY W/ BOWEL RESECTION  01-15-2004  @[facility]    small bowel resection for stricture/ benzoar   EXTRACORPOREAL SHOCK WAVE LITHOTRIPSY Right 03/27/2018   Procedure: EXTRACORPOREAL SHOCK WAVE LITHOTRIPSY (ESWL);  Surgeon: Ardis Hughs, MD;  Location: [facility];  Service: Urology;  Laterality: Right;   EXTRACORPOREAL SHOCK WAVE LITHOTRIPSY  x2 2008;  2009;  2015   HOLMIUM LASER APPLICATION Left 0/56/9794   Procedure: HOLMIUM LASER APPLICATION;  Surgeon: Ceasar Mons, MD;  Location: [facility];  Service: Urology;  Laterality: Left;   ORIF ANKLE FRACTURE Right 09/ 2008 @[facility]    per pt has retained hardware   TONSILLECTOMY  age 70     OB History   No obstetric history on file.     Family History  Problem Relation Age of Onset   Breast cancer Maternal Aunt    Heart disease Father    Heart Problems Father     Social History   Tobacco  Use   Smoking status: Never   Smokeless tobacco: Never  Vaping Use   Vaping Use: Never used  Substance Use Topics   Alcohol use: Yes    Alcohol/week: 7.0 standard drinks    Types: 7 Standard drinks or equivalent per week    Comment: scotch or wine maybe 1 a night   Drug use: Never    Home Medications Prior to Admission medications   Medication Sig Start Date End Date Taking? Authorizing Provider  albuterol (PROVENTIL HFA;VENTOLIN HFA)  108 (90 BASE) MCG/ACT inhaler Inhale 1 puff into the lungs every 6 (six) hours as needed for wheezing or shortness of breath.    [provider]  amLODipine (NORVASC) 5 MG tablet Take 5 mg by mouth daily.     [provider]  atorvastatin (LIPITOR) 40 MG tablet Take 40 mg by mouth daily.     [provider]  BREO ELLIPTA 100-25 MCG/INH AEPB INHALE 1 PUFFS INTO THE LUNGS ONCE DAILY 08/04/20   Mannam, Hart Robinsons, MD  celecoxib (CELEBREX) 200 MG capsule Take 400 mg by mouth daily.     [provider]  cetirizine (ZYRTEC) 10 MG tablet Take 10 mg by mouth daily.    [provider]  cyclobenzaprine (FLEXERIL) 10 MG tablet Take 10 mg by mouth 3 (three) times daily as needed for muscle spasms.    [provider]  fluticasone (FLONASE) 50 MCG/ACT nasal spray Place 1 spray into both nostrils 2 (two) times daily.     [provider]  gabapentin (NEURONTIN) 100 MG capsule Take 200 mg by mouth at bedtime.     [provider]  losartan (COZAAR) 50 MG tablet Take 50 mg by mouth daily.  04/25/18   [provider]  montelukast (SINGULAIR) 10 MG tablet Take 10 mg by mouth daily.     [provider]  ondansetron (ZOFRAN ODT) 8 MG disintegrating tablet Take 1 tablet (8 mg total) by mouth every 8 (eight) hours as needed. 08/12/19   Ripley Fraise, MD  oxybutynin (DITROPAN) 5 MG tablet Take 1 tablet (5 mg total) by mouth every 8 (eight) hours as needed for bladder spasms. 11/17/19   Ceasar Mons, MD  traZODone (DESYREL) 150 MG tablet Take 150 mg by mouth at bedtime.     [provider]  venlafaxine XR (EFFEXOR-XR) 150 MG 24 hr capsule Take 150 mg by mouth every morning.     [provider]    Allergies    Oxycontin [oxycodone], Penicillins, and Tetracyclines & related  Review of Systems   Review of Systems  Constitutional:  Negative for chills and fever.  HENT:  Negative for ear pain and sore throat.    Eyes:  Negative for pain and visual disturbance.  Respiratory:  Negative for cough and shortness of breath.   Cardiovascular:  Negative for chest pain and palpitations.  Gastrointestinal:  Negative for abdominal pain and vomiting.  Genitourinary:  Negative for dysuria and hematuria.  Musculoskeletal:  Positive for back pain. Negative for arthralgias.  Skin:  Negative for color change and rash.  Neurological:  Positive for numbness. Negative for seizures and syncope.  All other systems reviewed and are negative.  Physical Exam Updated Vital Signs BP (!) 144/93 (BP Location: Left Arm)   Pulse (!) 103   Temp 98.7 F (37.1 C) (Oral)   Resp 18   Ht 5' 6"  (1.676 m)   Wt 90.7 kg   SpO2 96%   BMI 32.28 kg/m  Physical Exam Vitals and nursing note reviewed.  Constitutional:      General: She is not in acute distress.    Appearance: She is well-developed.  HENT:     Head: Normocephalic and atraumatic.  Eyes:     Conjunctiva/sclera: Conjunctivae normal.  Cardiovascular:     Rate and Rhythm: Normal rate and regular rhythm.     Heart sounds: No murmur heard. Pulmonary:     Effort: Pulmonary effort is normal. No respiratory distress.     Breath sounds: Normal breath sounds.  Abdominal:     Palpations: Abdomen is soft.     Tenderness: There is no abdominal tenderness.  Musculoskeletal:     Cervical back: Neck supple.  Skin:    General: Skin is warm and dry.  Neurological:     Mental Status: She is alert.     Cranial Nerves: No cranial nerve deficit.     Sensory: Sensory deficit (subjective numbness to the the soles of bilateral feet) present.     Motor: No weakness.    ED Results / Procedures / Treatments   Labs (all labs ordered are listed, but only abnormal results are displayed) Labs Reviewed - No data to display  EKG None  Radiology No results found.  Procedures Procedures   Medications Ordered in ED Medications - No data to display  ED Course  I have  reviewed the triage vital signs and the nursing notes.  Pertinent labs & imaging results that were available during my care of the patient were reviewed by me and considered in my medical decision making (see chart for details).    MDM Rules/Calculators/A&P                          Patient seen the emergency department for evaluation of low back pain.  Physical exam reveals subjective numbness under bilateral soles and tenderness in the L-spine but is otherwise unremarkable.  Due to previous orthopedic concern for cord compression and lumbar stenosis, an MRI was performed that shows evidence of possible discitis at L4-L5 versus fracture.  In the setting of no trauma, fracture unlikely.  Patient is a leukocytosis to 12.6, CRP elevated to 19.9, ESR 114.  I have concern for discitis at this time and patient pain is uncontrolled.  Patient will require hospital admission as the patient is unable to ambulate and vancomycin was started for discitis.  Patient admitted. Final Clinical Impression(s) / ED Diagnoses Final diagnoses:  None    Rx / DC Orders ED Discharge Orders     None        Mylia Pondexter, MD 01/17/21 1616    Teressa Lower, MD 01/17/21 [phone]

## 2021-01-17 NOTE — Progress Notes (Signed)
A consult was received from an ED physician for vancomycin per pharmacy dosing (for an indication other than meningitis). The patient's profile has been reviewed for ht/wt/allergies/indication/available labs. A one time order has been placed for the above antibiotics.  Further antibiotics/pharmacy consults should be ordered by admitting physician if indicated.                       Bernadene Person, PharmD, BCPS [phone] 01/17/2021, 3:28 PM

## 2021-01-17 NOTE — ED Notes (Signed)
MRI here to take pt.

## 2021-01-17 NOTE — ED Notes (Signed)
This nurse entered the pt's room to find the pt had removed all monitor cords and was lying on her left side with her right leg over the left side rail. This nurse and ED tech re-adjusted pt in the ED stretcher and re-attached pt to cardiac monitor.

## 2021-01-17 NOTE — ED Notes (Signed)
Pt back from MRI at this time

## 2021-01-17 NOTE — ED Notes (Signed)
ED provider at the bedside to evaluate.  

## 2021-01-17 NOTE — ED Triage Notes (Signed)
Ems brings pt in for lower back pain. Pt reports history of back issues but not pain like today. Pt denies any new injury.

## 2021-01-17 NOTE — ED Notes (Signed)
Hospitalist at the bedside 

## 2021-01-17 NOTE — H&P (Signed)
History and Physical    Haley Singh DOB: Apr 15, 1951 DOA: 01/17/2021  PCP: Katherina Mires, MD (Confirm with patient/family/NH records and if not entered, this has to be entered at [facility] point of entry) Patient coming from: Home  I have personally briefly reviewed patient's old medical records in [place]  Chief Complaint: Severe back pain  HPI: Haley Singh is a 70 y.o. female with medical history significant of chronic back pain, OA, HTN, HLD, asthma, presented with sudden onset of severe back pain.  Has a chronic back problem, initially identified incidentally by his orthopedic surgery when imaging for her hips in 2018.  Which showed she had severe degenerative changes disc disease L4-L5 and L5-S1.  At that point she had very mild symptoms, and recently image study she had showed that the degenerative changes on lumbar spine progressed and her orthopedic surgery right for her to see neurosurgery to discuss further management plan.  She is yet to set up appointment with neurosurgery.  At baseline, " back pain barely bothers", but this morning, patient woke up with 10/10 sharp like back pain radiating to bilateral hip area, she has no trouble urinate, no foot drop or numbness in the saddle area.  But back pain is so severe she could not even sit up or stand up.   ED Course: Lumbar saline MRI showed increased T2 signal in left L4-L5 and increased T2 signal in L5 vertebral body edema and no ostial destruction caused mild spinal canal narrowing, suspicious for acute fracture versus infection.  1 dose of vancomycin given in the ED.  WBC 12.6.  ESR > 100, CRP 19.   Review of Systems: As per HPI otherwise 14 point review of systems negative.    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   Hyperlipidemia    Hypertension    followed by pcp  (11-10-2019 pt stated never had a stress test)   Insomnia    MDD (major depressive disorder)    Mild persistent asthma    followed by pcp and dr Bernita Raisin (pulmonologist)   RLS (restless legs syndrome)     Past Surgical History:  Procedure Laterality Date   CATARACT EXTRACTION W/ INTRAOCULAR LENS  IMPLANT, BILATERAL  2009; 2010   CYSTOSCOPY/URETEROSCOPY/HOLMIUM LASER/STENT PLACEMENT Right 08/26/2019   Procedure: CYSTOSCOPY/RETROGRADE/URETEROSCOPY/HOLMIUM LASER/STENT PLACEMENT;  Surgeon: Ceasar Mons, MD;  Location: [facility];  Service: Urology;  Laterality: Right;   CYSTOSCOPY/URETEROSCOPY/HOLMIUM LASER/STENT PLACEMENT Left 11/17/2019   Procedure: CYSTOSCOPY/URETEROSCOPY, RETROGRADE,LASER LITHOTRIPSY, STENT PLACEMENT;  Surgeon: Ceasar Mons, MD;  Location: [facility];  Service: Urology;  Laterality: Left;   EXPLORATORY LAPAROTOMY W/ BOWEL RESECTION  01-15-2004  _0    small bowel resection for stricture/ benzoar   EXTRACORPOREAL SHOCK WAVE LITHOTRIPSY Right 03/27/2018   Procedure: EXTRACORPOREAL SHOCK WAVE LITHOTRIPSY (ESWL);  Surgeon: Ardis Hughs, MD;  Location: [facility];  Service: Urology;  Laterality: Right;   EXTRACORPOREAL SHOCK WAVE LITHOTRIPSY  x2 2008;  2009;  2015   HOLMIUM LASER APPLICATION Left 0/93/2671   Procedure: HOLMIUM LASER APPLICATION;  Surgeon: Ceasar Mons, MD;  Location: [facility];  Service: Urology;  Laterality: Left;   ORIF ANKLE  FRACTURE Right 09/ 2008 _0    per pt has retained hardware   TONSILLECTOMY  age 65     reports that she has never smoked. She has never used smokeless tobacco. She reports current alcohol use of about 7.0 standard drinks per week. She reports that she does not use drugs.  Allergies  Allergen Reactions   Oxycontin [Oxycodone] Itching   Penicillins  Other (See Comments)    unk Did it involve swelling of the face/tongue/throat, SOB, or low BP? Unknown Did it involve sudden or severe rash/hives, skin peeling, or any reaction on the inside of your mouth or nose? Unknown Did you need to seek medical attention at a hospital or doctor's office? Unknown When did it last happen?     over 10 years ago  If all above answers are "NO", may proceed with cephalosporin use.    Tetracyclines & Related Itching    Family History  Problem Relation Age of Onset   Breast cancer Maternal Aunt    Heart disease Father    Heart Problems Father      Prior to Admission medications   Medication Sig Start Date End Date Taking? Authorizing Provider  albuterol (PROVENTIL HFA;VENTOLIN HFA) 108 (90 BASE) MCG/ACT inhaler Inhale 1 puff into the lungs every 6 (six) hours as needed for wheezing or shortness of breath.   Yes [provider]  amLODipine (NORVASC) 5 MG tablet Take 5 mg by mouth daily.    Yes [provider]  atorvastatin (LIPITOR) 40 MG tablet Take 40 mg by mouth daily.    Yes [provider]  BREO ELLIPTA 100-25 MCG/INH AEPB INHALE 1 PUFFS INTO THE LUNGS ONCE DAILY Patient taking differently: Inhale 1 puff into the lungs daily. 08/04/20  Yes Mannam, Praveen, MD  celecoxib (CELEBREX) 200 MG capsule Take 200-400 mg by mouth daily as needed for mild pain.   Yes [provider]  cetirizine (ZYRTEC) 10 MG tablet Take 10 mg by mouth daily as needed for allergies.   Yes [provider]  fluticasone (FLONASE) 50 MCG/ACT nasal spray Place 1 spray into both nostrils 2 (two) times daily.    Yes [provider]  gabapentin (NEURONTIN) 300 MG capsule Take 600 mg by mouth at bedtime.   Yes [provider]  losartan (COZAAR) 50 MG tablet Take 50 mg by mouth daily.  04/25/18  Yes [provider]  montelukast (SINGULAIR) 10 MG tablet Take 10 mg by mouth daily.    Yes [provider]   SUMAtriptan (IMITREX) 50 MG tablet Take 50 mg by mouth daily as needed for migraine. May repeat 1 dose in an hour. 12/07/20  Yes [provider]  traZODone (DESYREL) 150 MG tablet Take 150 mg by mouth at bedtime.    Yes [provider]  venlafaxine XR (EFFEXOR-XR) 150 MG 24 hr capsule Take 150 mg by mouth every morning.    Yes [provider]  ondansetron (ZOFRAN ODT) 8 MG disintegrating tablet Take 1 tablet (8 mg total) by mouth every 8 (eight) hours as needed. Patient not taking: No sig reported 08/12/19   Ripley Fraise, MD  oxybutynin (DITROPAN) 5 MG tablet Take 1 tablet (5 mg total) by mouth every 8 (eight) hours as needed for bladder spasms. Patient not taking: No sig reported 11/17/19   Ceasar Mons, MD    Physical Exam: Vitals:   01/17/21 1218 01/17/21 1400 01/17/21 1500 01/17/21 1606  BP: 136/74 (!) 141/71 (!) 150/77 (!) 143/74  Pulse: (!) 146 (!) 107 (!) 102 (!) 102  Resp: _0 Temp:    98.2 F (36.8 C)  TempSrc:    Oral  SpO2: 91% 93% 91% 95%  Weight:      Height:        Constitutional: NAD, calm, comfortable Vitals:   01/17/21 1218 01/17/21 1400 01/17/21 1500 01/17/21 1606  BP: 136/74 (!) 141/71 (!) 150/77 (!) 143/74  Pulse: (!) 146 (!) 107 (!) 102 (!) 102  Resp: _1 Temp:    98.2 F (36.8 C)  TempSrc:    Oral  SpO2: 91% 93% 91% 95%  Weight:      Height:       Eyes: PERRL, lids and conjunctivae normal ENMT: Mucous membranes are moist. Posterior pharynx clear of any exudate or lesions.Normal dentition.  Neck: normal, supple, no masses, no thyromegaly Respiratory: clear to auscultation bilaterally, no wheezing, no crackles. Normal respiratory effort. No accessory muscle use.  Cardiovascular: Regular rate and rhythm, no murmurs / rubs / gallops. No extremity edema. 2+ pedal pulses. No carotid bruits.  Abdomen: no tenderness, no masses palpated. No hepatosplenomegaly. Bowel sounds positive.  Musculoskeletal:  Severe tenderness on L4-L5 level with reduced ROM extremities. Good ROM, no contractures. Normal muscle tone.  Skin: no rashes, lesions, ulcers. No induration Neurologic: CN 2-12 grossly intact. Sensation intact, DTR normal. Strength 5/5 in all 4.  Psychiatric: Normal judgment and insight. Alert and oriented x 3. Normal mood.     Labs on Admission: I have personally reviewed following labs and imaging studies  CBC: Recent Labs  Lab 01/17/21 1124  WBC 12.6*  NEUTROABS 11.2*  HGB 11.1*  HCT 34.4*  MCV 91.0  PLT 803   Basic Metabolic Panel: Recent Labs  Lab 01/17/21 1124  NA 138  K 3.6  CL 101  CO2 26  GLUCOSE 130*  BUN 9  CREATININE 0.80  CALCIUM 8.5*   GFR: Estimated Creatinine Clearance: 72.1 mL/min (by C-G formula based on SCr of 0.8 mg/dL). Liver Function Tests: Recent Labs  Lab 01/17/21 1124  AST 13*  ALT 15  ALKPHOS 94  BILITOT 1.0  PROT 8.0  ALBUMIN 3.0*   No results for input(s): LIPASE, AMYLASE in the last 168 hours. No results for input(s): AMMONIA in the last 168 hours. Coagulation Profile: No results for input(s): INR, PROTIME in the last 168 hours. Cardiac Enzymes: No results for input(s): CKTOTAL, CKMB, CKMBINDEX, TROPONINI in the last 168 hours. BNP (last 3 results) No results for input(s): PROBNP in the last 8760 hours. HbA1C: No results for input(s): HGBA1C in the last 72 hours. CBG: No results for input(s): GLUCAP in the last 168 hours. Lipid Profile: No results for input(s): CHOL, HDL, LDLCALC, TRIG, CHOLHDL, LDLDIRECT in the last 72 hours. Thyroid Function Tests: No results for input(s): TSH, T4TOTAL, FREET4, T3FREE, THYROIDAB in the last 72 hours. Anemia Panel: No results for input(s): VITAMINB12, FOLATE, FERRITIN, TIBC, IRON, RETICCTPCT in the last 72 hours. Urine analysis:    Component Value Date/Time   COLORURINE YELLOW 08/12/2019 0203   APPEARANCEUR CLEAR 08/12/2019 0203   LABSPEC 1.012 08/12/2019 0203   PHURINE 6.0  08/12/2019 0203   GLUCOSEU NEGATIVE 08/12/2019 0203   HGBUR LARGE (A) 08/12/2019 0203   BILIRUBINUR NEGATIVE 08/12/2019 0203   KETONESUR NEGATIVE 08/12/2019 0203   PROTEINUR NEGATIVE 08/12/2019 0203   NITRITE NEGATIVE 08/12/2019 0203   LEUKOCYTESUR NEGATIVE 08/12/2019 0203    Radiological Exams on Admission: MR  LUMBAR SPINE WO CONTRAST  Result Date: 01/17/2021 CLINICAL DATA:  Severe low back pain, concern for cord compression EXAM: MRI LUMBAR SPINE WITHOUT CONTRAST TECHNIQUE: Multiplanar, multisequence MR imaging of the lumbar spine was performed. No intravenous contrast was administered. COMPARISON:  No prior MRI correlation is made with 08/12/2019 CT pelvis FINDINGS: Segmentation:  Standard. Alignment: 6 mm retrolisthesis L1 on L2, which appears similar to 08/12/2019. Grade 1 anterolisthesis L4 on L5, which appears unchanged compared to 08/12/2019. Vertebrae: Increased T2 signal in the left facets at L4-L5, extending from the superior posterior left aspect of the L5 vertebral body, through the left pedicle and into the facet, with edema in the surrounding soft tissue fat. Degenerative endplate changes at X9-J4. Schmorl's nodes at L2-L3. 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 Conus medullaris and cauda equina: Conus extends to the L1-L2 level. Conus and cauda equina appear normal. Paraspinal and other soft tissues: Increased T2 signal in the soft tissues surrounding the left L4-L5 facets. Otherwise negative. Disc levels: T12-L1: No significant disc bulge. No spinal canal stenosis or neural foraminal narrowing. Right perineural cyst. L1-L2: 6 mm retrolisthesis. Disc height loss with disc osteophyte complex. Mild facet arthropathy. No spinal canal stenosis. Moderate bilateral neural foraminal narrowing. L2-L3: Broad-based disc bulge. Epidural lipomatosis. Moderate facet arthropathy with ligamentum flavum hypertrophy. Mild spinal canal stenosis. Moderate left and mild-to-moderate right neural foraminal narrowing.  L3-L4: Mild disc bulge, with epidural lipomatosis. Mild facet arthropathy with ligamentum flavum hypertrophy. Minimal thecal sac narrowing. Mild left neural foraminal narrowing. L4-L5: Grade 1 anterolisthesis with disc unroofing and broad-based disc bulge. Increased T2 signal in the left facet, with bony fragments and edema that cause rightward deviation of the thecal sac, with mild spinal canal stenosis. Moderate to severe right facet arthropathy, with fluid in right facet. Moderate left and mild right foraminal narrowing. L5-S1: Disc height loss with minimal disc bulge. No spinal canal stenosis. No neural foraminal narrowing. IMPRESSION: 1. Increased T2 signal about and disruption of the left L4-L5 facets, with increased T2 signal in posterior L5 vertebral body, left pedicle, and surrounding soft tissues. Edema and osseous destruction cause mild spinal canal narrowing. This could represent acute fracture or infection. Correlate with history of trauma and consider CT for further osseous evaluation. 2. L2-L3 mild canal, moderate left neural foraminal narrowing and mild-to-moderate right neural foraminal narrowing. These results were called by telephone at the time of interpretation on 01/17/2021 at 12:52 pm to provider MADISON [facility] , who verbally acknowledged these results. Electronically Signed   By: Merilyn Baba M.D.   On: 01/17/2021 12:53    EKG: NOne  Assessment/Plan Active Problems:   Osteomyelitis ([place])  (please populate well all problems here in Problem List. (For example, if patient is on BP meds at home and you resume or decide to hold them, it is a problem that needs to be her. Same for CAD, COPD, HLD and so on)  Severe back pain and acute ambulation dysfunction -Clinical course, onset is sudden, no fever, and at baseline patient already has known history of L4-L5 and L5-S1 degenerative OA which made her susceptible to further inflammation changes.  Suspicion for osteomyelitis/infection  discitis is low.  Decided to monitor off further antibiotics. -Discussed with on-call neurosurgery about the finding on the MRI, recommend monitor off antibiotics and waiting for a CT scan to rule out fracture. -LSO and pain meds.  PT evaluation  HTN -Controlled, continue home BP meds  COPD/asthma -No symptoms signs of acute exacerbation, continue home breathing  regimen and Breo.  Anxiety/depression -Continue SSRI  DVT prophylaxis: Lovenox Code Status: Full code Family Communication: None at bedside Disposition Plan: Expect more than 2 midnight hospital stay, may need rehab. Consults called: Neurosurgery, neurosurgery will follow up tomorrow after CT lumbar spine Admission status: MedSurg admission   Lequita Halt MD [facility] Pager [phone]  01/17/2021, 4:44 PM

## 2021-01-17 NOTE — ED Notes (Signed)
Pt has been counseled by multiple nurses and ED techs to please keep her cardiac leads on, however she continues to pull them off.

## 2021-01-18 ENCOUNTER — Inpatient Hospital Stay (HOSPITAL_COMMUNITY): Payer: Medicare PPO

## 2021-01-18 DIAGNOSIS — M545 Low back pain, unspecified: Secondary | ICD-10-CM

## 2021-01-18 LAB — CBC
HCT: 33.7 % — ABNORMAL LOW (ref 36.0–46.0)
Hemoglobin: 10.5 g/dL — ABNORMAL LOW (ref 12.0–15.0)
MCH: 29 pg (ref 26.0–34.0)
MCHC: 31.2 g/dL (ref 30.0–36.0)
MCV: 93.1 fL (ref 80.0–100.0)
Platelets: 273 10*3/uL (ref 150–400)
RBC: 3.62 MIL/uL — ABNORMAL LOW (ref 3.87–5.11)
RDW: 14.8 % (ref 11.5–15.5)
WBC: 14.4 10*3/uL — ABNORMAL HIGH (ref 4.0–10.5)
nRBC: 0 % (ref 0.0–0.2)

## 2021-01-18 LAB — SARS CORONAVIRUS 2 (TAT 6-24 HRS): SARS Coronavirus 2: NEGATIVE

## 2021-01-18 MED ORDER — METHOCARBAMOL 500 MG PO TABS
500.0000 mg | ORAL_TABLET | Freq: Three times a day (TID) | ORAL | Status: DC
  Administered 2021-01-18 – 2021-01-21 (×10): 500 mg via ORAL
  Filled 2021-01-18 (×10): qty 1

## 2021-01-18 MED ORDER — HYDROMORPHONE HCL 1 MG/ML IJ SOLN
1.0000 mg | INTRAMUSCULAR | Status: DC | PRN
  Administered 2021-01-18 – 2021-01-19 (×6): 1 mg via INTRAVENOUS
  Filled 2021-01-18 (×6): qty 1

## 2021-01-18 MED ORDER — OXYCODONE-ACETAMINOPHEN 5-325 MG PO TABS
1.0000 | ORAL_TABLET | ORAL | Status: DC | PRN
Start: 2021-01-18 — End: 2021-01-19

## 2021-01-18 NOTE — Progress Notes (Addendum)
Was called in regards to this patients MRI and CT scan results. At this point it looks like septic arthritis at L4-5. We would recommend IR for aspiration. Also consult ID for antibiotics. No neurosurgical intervention is warranted at this time.   Suspect septic arthritis of L4-5 facet joint based on imaging and ESR/CRP. Need to determine pathogen. Recommend blood cultures and IR aspiration. Once bug determined would need 6 wks IV abx followed by 6 wks oral. ID can help here.  Lumbar corset brace may help pain when OOB. No indication for acute surgical intervention as there is no epidural process.

## 2021-01-18 NOTE — Progress Notes (Signed)
Patient ID: Haley Singh, female   DOB: [birth year], 70 y.o.   MRN: [id number] Patient scheduled for CT-guided left L4-5 facet joint aspiration today.  Imaging studies were reviewed by Dr.Mugweru.  Details/risks of procedure, including but not limited to, internal bleeding, infection, injury to adjacent structures discussed with patient with her understanding and consent.

## 2021-01-18 NOTE — Progress Notes (Signed)
Initial Nutrition Assessment  DOCUMENTATION CODES:   Obesity unspecified  INTERVENTION:   Once diet advanced: -Ensure MAX Protein po BID, each supplement provides 150 kcal and 30 grams of protein  -Prosource Plus PO BID, each provides 100 kcals and 15g protein   NUTRITION DIAGNOSIS:   Increased nutrient needs related to acute illness as evidenced by estimated needs.  GOAL:   Patient will meet greater than or equal to 90% of their needs  MONITOR:   Diet advancement, Labs, Weight trends, I & O's, Skin  REASON FOR ASSESSMENT:   Malnutrition Screening Tool    ASSESSMENT:   70 y.o. female with medical history significant of chronic back pain, OA, HTN, HLD, asthma, presented with sudden onset of severe back pain.  Patient awaiting aspiration in IR. Pt with septic arthritis of L4/5 per neurosurgery note. NPO for procedure. Pt did consume 50% of breakfast this morning, consisted of french toast and fresh fruit. Will likely benefit from protein supplements once diet advanced.  Per weight records, pt lost 6 lbs since June 2021.   Medications reviewed.  Labs reviewed.  NUTRITION - FOCUSED PHYSICAL EXAM:  Deferred.  Diet Order:   Diet Order             Diet NPO time specified  Diet effective now                   EDUCATION NEEDS:   No education needs have been identified at this time  Skin:  Skin Assessment: Reviewed RN Assessment  Last BM:  8/17 -type 3  Height:   Ht Readings from Last 1 Encounters:  01/17/21 5\' 6"  (1.676 m)    Weight:   Wt Readings from Last 1 Encounters:  01/17/21 90.7 kg    BMI:  Body mass index is 32.28 kg/m.  Estimated Nutritional Needs:   Kcal:  1650-1850  Protein:  75-95g  Fluid:  1.9L/day  01/19/21, MS, RD, LDN Inpatient Clinical Dietitian Contact information available via Amion

## 2021-01-18 NOTE — Progress Notes (Signed)
PROGRESS NOTE   Haley Singh  MRN:[id number] DOB: 09/30/1950 DOA: 01/17/2021 PCP: Briscoe, Kim K, MD  Brief Narrative:  70-year-old white female Asthma HTN depression hyperlipidemia previous bilateral renal stones with double-J stent status post lithotripsy 2008--subsequent multiple procedures 08/26/2019 and 10/12/2019 Small bowel obstruction 2005 requiring laparotomy for small bowel resection and primary anastomosis  Admitted 8/16 with severe back pain Work-up showed  L4-L5, L5-S1 T2 signal in L5 vertebral body with mild canal narrowing?  Infection WBC 12 ESR over 100 CRP 19 no phlegmon on CT Neurosurgery consulted recommended conservative management    Hospital-Problem based course  Sepsis on admission secondary to possible facet joint infection? Getting facet joint aspiration and culture later today Pain uncontrolled so give Dilaudid every 2 may need PCA--continue Celebrex 200-400 for moderate pain, oxycodone 1 every 4 as needed for breakthrough Continue Robaxin 500 3 times daily, gabapentin 600 at bedtime 1 dose vancomycin given hold further antibiotics at this time Prior bilateral renal stones status post lithotripsy 2008 2021 Monitor trends Migraines Continue Imitrex 50 daily as needed HTN Uncontrolled Continue amlodipine 5, losartan 50 Depression Continue trazodone 150 at bedtime, Effexor 1:50 AM   DVT prophylaxis: Lovenox Code Status: Full Family Communication: None Disposition:  Status is: Inpatient  Remains inpatient appropriate because:Hemodynamically unstable, Ongoing active pain requiring inpatient pain management, and Altered mental status  Dispo: The patient is from: Home              Anticipated d/c is to: SNF              Patient currently is not medically stable to d/c.   Difficult to place patient No       Consultants:  Neurosurgery consulted IR consulted  Procedures: None yet  Antimicrobials:     Subjective: In severe pain Laying flat  but pain is not controlled with IV meds  Objective: Vitals:   01/17/21 2256 01/18/21 0233 01/18/21 0523 01/18/21 0945  BP: 112/66 (!) 108/59 (!) 114/56 122/61  Pulse: 88 76 88 90  Resp: 16 18 16 18  Temp: 98.9 F (37.2 C) 98.2 F (36.8 C) 98.1 F (36.7 C) 98.6 F (37 C)  TempSrc:    Oral  SpO2: 91% 94% (!) 89% 95%  Weight:      Height:        Intake/Output Summary (Last 24 hours) at 01/18/2021 1057 Last data filed at 01/18/2021 1000 Gross per 24 hour  Intake 1320 ml  Output 850 ml  Net 470 ml   Filed Weights   01/17/21 1028  Weight: 90.7 kg    Examination: EOMI NCAT no focal deficit Chest clear no added sound Brace in place CTA B no added sound no rales no rhonchi No lower extremity edema    Data Reviewed: personally reviewed   CBC    Component Value Date/Time   WBC 14.4 (H) 01/18/2021 0424   RBC 3.62 (L) 01/18/2021 0424   HGB 10.5 (L) 01/18/2021 0424   HCT 33.7 (L) 01/18/2021 0424   PLT 273 01/18/2021 0424   MCV 93.1 01/18/2021 0424   MCH 29.0 01/18/2021 0424   MCHC 31.2 01/18/2021 0424   RDW 14.8 01/18/2021 0424   LYMPHSABS 0.5 (L) 01/17/2021 1124   MONOABS 0.8 01/17/2021 1124   EOSABS 0.0 01/17/2021 1124   BASOSABS 0.0 01/17/2021 1124   CMP Latest Ref Rng & Units 01/17/2021 12/16/2020 11/17/2019  Glucose 70 - 99 mg/dL 130(H) 155(H) 142(H)  BUN 8 - 23 mg/dL 9 12   11  Creatinine 0.44 - 1.00 mg/dL 0.80 0.91 0.90  Sodium 135 - 145 mmol/L 138 134(L) 142  Potassium 3.5 - 5.1 mmol/L 3.6 3.0(L) 3.4(L)  Chloride 98 - 111 mmol/L 101 100 104  CO2 22 - 32 mmol/L 26 23 -  Calcium 8.9 - 10.3 mg/dL 8.5(L) 8.5(L) -  Total Protein 6.5 - 8.1 g/dL 8.0 7.4 -  Total Bilirubin 0.3 - 1.2 mg/dL 1.0 1.8(H) -  Alkaline Phos 38 - 126 U/L 94 94 -  AST 15 - 41 U/L 13(L) 17 -  ALT 0 - 44 U/L 15 22 -     Radiology Studies: CT Lumbar Spine Wo Contrast  Result Date: 01/17/2021 CLINICAL DATA:  Spine fracture, lumbosacral, pathological concern for lumbar fracture. Spinal  fracture, back pain, no new injury. EXAM: CT LUMBAR SPINE WITHOUT CONTRAST TECHNIQUE: Multidetector CT imaging of the lumbar spine was performed without intravenous contrast administration. Multiplanar CT image reconstructions were also generated. COMPARISON:  Lumbar spine MRI 01/17/2021. FINDINGS: Segmentation: Five lumbar vertebrae. The caudal most well-formed intervertebral disc space is designated L5-S1. Alignment: Trace L1-L2 and L2-L3 grade 1 retrolisthesis. 6 mm L4-L5 grade 1 anterolisthesis. Vertebrae: No lumbar vertebral compression fracture. Multilevel degenerative endplate irregularity. Schmorl node within the L3 superior endplate and L2 inferior endplate. No definite acute fracture is identified. There is prominent irregularity of the left L4 and L5 articular pillars with possible sites of erosion. Associated widening of the left L4-L5 facet joint. To a lesser extent, there is irregularity of the right L4 and L5 articular pillars. Possible early osseous fusion across the L5-S1 disc space. Paraspinal and other soft tissues: Aortoiliac atherosclerosis. Bilateral nephrolithiasis. Disc levels: Multilevel degenerative changes, more fully characterized on the MRI performed earlier today. Please refer to this prior report for further description. IMPRESSION: No definite acute fracture of the lumbar spine. Prominent irregularity of the left L4 and L5 articular pillars with possible sites of erosion. Associated widening of the left L4-L5 facet joint. To a lesser extent, there is irregularity of the right L4 and L5 articular pillars. These findings may reflect septic arthritis or advanced facet arthrosis. Clinical correlation is recommended. Additionally, MRI follow-up for surveillance should be considered. Acute facet joint capsular injury on the left at L4-L5 cannot be excluded, particularly if there has been recent trauma. 4 mm L4-L5 grade 1 anterolisthesis. Trace L1-L2, L2-L3 and L3-L4 grade 1 retrolisthesis.  Lumbar spondylosis, more fully characterized on the lumbar spine MRI performed earlier today. Please refer to this prior report for further description. Aortic Atherosclerosis (ICD10-I70.0). Bilateral nephrolithiasis. Electronically Signed   By: Kyle  Golden D.O.   On: 01/17/2021 18:17   MR LUMBAR SPINE WO CONTRAST  Result Date: 01/17/2021 CLINICAL DATA:  Severe low back pain, concern for cord compression EXAM: MRI LUMBAR SPINE WITHOUT CONTRAST TECHNIQUE: Multiplanar, multisequence MR imaging of the lumbar spine was performed. No intravenous contrast was administered. COMPARISON:  No prior MRI correlation is made with 08/12/2019 CT pelvis FINDINGS: Segmentation:  Standard. Alignment: 6 mm retrolisthesis L1 on L2, which appears similar to 08/12/2019. Grade 1 anterolisthesis L4 on L5, which appears unchanged compared to 08/12/2019. Vertebrae: Increased T2 signal in the left facets at L4-L5, extending from the superior posterior left aspect of the L5 vertebral body, through the left pedicle and into the facet, with edema in the surrounding soft tissue fat. Degenerative endplate changes at L1-L2. Schmorl's nodes at L2-L3. 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 Conus medullaris and cauda equina: Conus extends to the L1-L2 level.   Conus and cauda equina appear normal. Paraspinal and other soft tissues: Increased T2 signal in the soft tissues surrounding the left L4-L5 facets. Otherwise negative. Disc levels: T12-L1: No significant disc bulge. No spinal canal stenosis or neural foraminal narrowing. Right perineural cyst. L1-L2: 6 mm retrolisthesis. Disc height loss with disc osteophyte complex. Mild facet arthropathy. No spinal canal stenosis. Moderate bilateral neural foraminal narrowing. L2-L3: Broad-based disc bulge. Epidural lipomatosis. Moderate facet arthropathy with ligamentum flavum hypertrophy. Mild spinal canal stenosis. Moderate left and mild-to-moderate right neural foraminal narrowing. L3-L4: Mild disc bulge, with  epidural lipomatosis. Mild facet arthropathy with ligamentum flavum hypertrophy. Minimal thecal sac narrowing. Mild left neural foraminal narrowing. L4-L5: Grade 1 anterolisthesis with disc unroofing and broad-based disc bulge. Increased T2 signal in the left facet, with bony fragments and edema that cause rightward deviation of the thecal sac, with mild spinal canal stenosis. Moderate to severe right facet arthropathy, with fluid in right facet. Moderate left and mild right foraminal narrowing. L5-S1: Disc height loss with minimal disc bulge. No spinal canal stenosis. No neural foraminal narrowing. IMPRESSION: 1. Increased T2 signal about and disruption of the left L4-L5 facets, with increased T2 signal in posterior L5 vertebral body, left pedicle, and surrounding soft tissues. Edema and osseous destruction cause mild spinal canal narrowing. This could represent acute fracture or infection. Correlate with history of trauma and consider CT for further osseous evaluation. 2. L2-L3 mild canal, moderate left neural foraminal narrowing and mild-to-moderate right neural foraminal narrowing. These results were called by telephone at the time of interpretation on 01/17/2021 at 12:52 pm to provider MADISON KOMMOR , who verbally acknowledged these results. Electronically Signed   By: Alison  Vasan M.D.   On: 01/17/2021 12:53     Scheduled Meds:  amLODipine  5 mg Oral Daily   atorvastatin  40 mg Oral Daily   enoxaparin (LOVENOX) injection  40 mg Subcutaneous Q24H   fluticasone  1 spray Each Nare BID   fluticasone furoate-vilanterol  1 puff Inhalation Daily   gabapentin  600 mg Oral QHS   loratadine  10 mg Oral Daily   losartan  50 mg Oral Daily   methocarbamol  500 mg Oral TID   montelukast  10 mg Oral QHS   traZODone  150 mg Oral QHS   venlafaxine XR  150 mg Oral q morning   Continuous Infusions:   LOS: 1 day   Time spent: 30  Jai-Gurmukh Samtani, MD [facility] To contact the attending  provider between 7A-7P or the covering provider during after hours 7P-7A, please log into the web site [email or web address] and access using universal [place] password for that web site. If you do not have the password, please call the hospital operator.  01/18/2021, 10:57 AM    

## 2021-01-18 NOTE — Procedures (Signed)
Vascular and Interventional Radiology Procedure Note  Patient: Haley Singh DOB: February 05, 1951 Medical Record Number: [id number] Note Date/Time: 01/18/21 2:52 PM   Performing Physician: Roanna Banning, MD Assistant(s): None  Diagnosis: Left L4/5 facet joint arthropathy  Procedure: ASPIRATION OF LEFT L4/L5 FACET JOINT  Anesthesia: Local Anesthetic Complications: None Estimated Blood Loss: Minimal Specimens: Sent for Gram Stain, Aerobe Culture, and Anerobe Culture  Findings:  Successful CT-guided ASPIRATION of L4/L5 facet joint   See detailed procedure note with images in PACS. The patient tolerated the procedure well without incident or complication and was returned to Floor Bed in stable condition.    Roanna Banning, MD [facility] Radiology   Pager. [phone] Clinic. [phone]

## 2021-01-18 NOTE — Evaluation (Signed)
Physical Therapy Evaluation Patient Details Name: Haley Singh MRN: [id number] DOB: September 22, 1950 Today's Date: 01/18/2021        Clinical Impression  Pt is 70 y.o. female with below listed HPI. Pt lives alone and reports she is independent at baseline with use of assistive device. Pt is currently experiencing increased pain levels limiting safety and requiring significantly increased time with all functional mobility. Recommend SNF at this time as pt requires MIN-MOD A to maintain safety with bed mobility and transfers, currently limited with ambulation, and has decreased support at home. Pt is currently below baseline mobility level and will benefit from continued skilled PT to increase their independence and maximize safety with mobility.        01/18/21 1000  PT Visit Information  Last PT Received On 01/18/21  Assistance Needed +1  History of Present Illness Pt is 70 y.o. female who presented to ED with sudden onset of severe back pain, septic arthritis suspected?Marland Kitchen PMH significant for chronic back pain, OA, HTN, HLD, asthma, depression, anxiety, insomnia, and R ankle ORIF (02/2007).  Precautions  Precautions Fall  Precaution Comments pt with brace donned in supine upon entry, remained on throughout session  Restrictions  Weight Bearing Restrictions No  Home Living  Family/patient expects to be discharged to: Private residence  Living Arrangements Alone  Available Help at Discharge Friend(s);Neighbor;Available PRN/intermittently  Type of Home House (townhouse)  Home Access Stairs to enter  Entrance Stairs-Number of Steps 3  Entrance Stairs-Rails None  Home Layout Multi-level  Alternate Level Stairs-Number of Steps 14  Alternate Level Stairs-Rails Right  Bathroom Shower/Tub Tub/shower unit  Bathroom Toilet Handicapped height  Occupational psychologist - 4 wheels;Cane - single point;Grab bars - tub/shower  Additional Comments Pt reports I with  ADLs and has been remaining upstairs in bedroom until she has to go downstairs. Reports she had fall prior to admission, denies any other falls. Has neighbors around intermittnely but elderly and unable to provide physical assist if needed.  Prior Function  Level of Independence Independent with assistive device(s)  Comments interchangable with use of rollator and cane  Communication  Communication No difficulties  Pain Assessment  Pain Assessment 0-10  Pain Score 9  Pain Location hips and LEs R>L  Pain Descriptors / Indicators Burning;Aching;Discomfort  Pain Intervention(s) Limited activity within patient's tolerance;Monitored during session;Repositioned  Cognition  Arousal/Alertness Awake/alert  Behavior During Therapy WFL for tasks assessed/performed  Overall Cognitive Status Within Functional Limits for tasks assessed  Upper Extremity Assessment  Upper Extremity Assessment Overall WFL for tasks assessed  Lower Extremity Assessment  Lower Extremity Assessment Generalized weakness  Cervical / Trunk Assessment  Cervical / Trunk Assessment Normal  Bed Mobility  Overal bed mobility Needs Assistance  Bed Mobility Supine to Sit  Supine to sit Mod assist;HOB elevated  General bed mobility comments MOD A for trunk to upright and cues for sequencing, pt with use of bed rails to come into sidelying (log roll technique)and increased time due to pain. HOB slowly elevated prior to bed mobility by pt, unable to tolerate HOB elevated for prolonged periods  Transfers  Overall transfer level Needs assistance  Equipment used Rolling walker (2 wheeled)  Transfers Squat Pivot Transfers  Squat pivot transfers Min assist  General transfer comment Squat pivot from EOB to [facility] with cues for safe hand placement. Pt initially deferring use of RW as she reports last time she utilized HHA with nursing staff. PT discussed use of RW  to work on maximizing independence. Pt using single UE on RW and other on BSC  armrest to perform squat pivot transfer. Pt with B feet placed on elevated surface for comfort while on BSC. Pt able to stand long enough for swith out of BSC and take x2 posterior steps to sit in recliner behind her. Pt with flexed posture stating that she is unable to stand up straight or for long period due to pain.  Balance  Overall balance assessment Needs assistance  Sitting-balance support Bilateral upper extremity supported;Feet supported  Sitting balance-Leahy Scale Fair  Sitting balance - Comments Pt able toperform lateral and anterios weight shfit for assist with pericare  Standing balance support Bilateral upper extremity supported  Standing balance-Leahy Scale Poor  Standing balance comment use of external support  PT - End of Session  Equipment Utilized During Treatment Gait belt;Back brace  Activity Tolerance Patient limited by pain  Patient left in chair;with call bell/phone within reach;with chair alarm set;Other (comment) (doctor present at EOS)  Nurse Communication Mobility status  PT Assessment  PT Recommendation/Assessment Patient needs continued PT services  PT Visit Diagnosis Unsteadiness on feet (R26.81);Difficulty in walking, not elsewhere classified (R26.2);Pain  Pain - Right/Left Right  Pain - part of body Leg;Hip (radiating from LB)  PT Problem List Decreased strength;Decreased range of motion;Decreased activity tolerance;Decreased balance;Decreased mobility;Decreased knowledge of use of DME;Pain  Barriers to Discharge Decreased caregiver support  Barriers to Discharge Comments pt lives alone  PT Plan  PT Frequency (ACUTE ONLY) Min 2X/week  PT Treatment/Interventions (ACUTE ONLY) DME instruction;Gait training;Functional mobility training;Therapeutic activities;Therapeutic exercise;Patient/family education;Stair training  AM-PAC PT "6 Clicks" Mobility Outcome Measure (Version 2)  Help needed turning from your back to your side while in a flat bed without using  bedrails? 2  Help needed moving from lying on your back to sitting on the side of a flat bed without using bedrails? 2  Help needed moving to and from a bed to a chair (including a wheelchair)? 3  Help needed standing up from a chair using your arms (e.g., wheelchair or bedside chair)? 3  Help needed to walk in hospital room? 1  Help needed climbing 3-5 steps with a railing?  1  6 Click Score 12  Consider Recommendation of Discharge To: CIR/SNF/LTACH  Progressive Mobility  What is the highest level of mobility based on the progressive mobility assessment? Level 3 (Stands with assist) - Balance while standing  and cannot march in place  Mobility Out of bed to chair with meals  PT Recommendation  Follow Up Recommendations SNF  Individuals Consulted  Consulted and Agree with Results and Recommendations Patient  Acute Rehab PT Goals  Patient Stated Goal have less pain  PT Goal Formulation With patient  Time For Goal Achievement 02/01/21  Potential to Achieve Goals Good  PT Time Calculation  PT Start Time (ACUTE ONLY) 1013  PT Stop Time (ACUTE ONLY) 1105 (13 minutes unbillable, pt using BSC, total skilled time )  PT Time Calculation (min) (ACUTE ONLY) 52 min  PT General Charges  $$ ACUTE PT VISIT 1 Visit  PT Evaluation  $PT Eval Low Complexity 1 Low  PT Treatments  $Therapeutic Activity 23-37 mins  Written Expression  Dominant Hand Right        Lyman Speller PT, DPT  Acute Rehabilitation Services  Office [phone]  01/18/2021, 4:09 PM

## 2021-01-19 ENCOUNTER — Inpatient Hospital Stay (HOSPITAL_COMMUNITY): Payer: Medicare PPO

## 2021-01-19 DIAGNOSIS — R7881 Bacteremia: Secondary | ICD-10-CM | POA: Diagnosis not present

## 2021-01-19 DIAGNOSIS — M4646 Discitis, unspecified, lumbar region: Secondary | ICD-10-CM | POA: Diagnosis not present

## 2021-01-19 DIAGNOSIS — M868X8 Other osteomyelitis, other site: Secondary | ICD-10-CM | POA: Diagnosis not present

## 2021-01-19 DIAGNOSIS — M545 Low back pain, unspecified: Secondary | ICD-10-CM | POA: Diagnosis not present

## 2021-01-19 LAB — BLOOD CULTURE ID PANEL (REFLEXED) - BCID2

## 2021-01-19 LAB — CBC WITH DIFFERENTIAL/PLATELET
Abs Immature Granulocytes: 0.18 10*3/uL — ABNORMAL HIGH (ref 0.00–0.07)
Basophils Absolute: 0 10*3/uL (ref 0.0–0.1)
Basophils Relative: 0 %
Eosinophils Absolute: 0 10*3/uL (ref 0.0–0.5)
Eosinophils Relative: 0 %
HCT: 31.7 % — ABNORMAL LOW (ref 36.0–46.0)
Hemoglobin: 10 g/dL — ABNORMAL LOW (ref 12.0–15.0)
Immature Granulocytes: 1 %
Lymphocytes Relative: 3 %
Lymphs Abs: 0.5 10*3/uL — ABNORMAL LOW (ref 0.7–4.0)
MCH: 28.7 pg (ref 26.0–34.0)
MCHC: 31.5 g/dL (ref 30.0–36.0)
MCV: 90.8 fL (ref 80.0–100.0)
Monocytes Absolute: 0.9 10*3/uL (ref 0.1–1.0)
Monocytes Relative: 5 %
Neutro Abs: 15.5 10*3/uL — ABNORMAL HIGH (ref 1.7–7.7)
Neutrophils Relative %: 91 %
Platelets: 261 10*3/uL (ref 150–400)
RBC: 3.49 MIL/uL — ABNORMAL LOW (ref 3.87–5.11)
RDW: 14.6 % (ref 11.5–15.5)
WBC: 17.1 10*3/uL — ABNORMAL HIGH (ref 4.0–10.5)
nRBC: 0 % (ref 0.0–0.2)

## 2021-01-19 LAB — ECHOCARDIOGRAM COMPLETE
Area-P 1/2: 2.73 cm2
Height: 66 in
S' Lateral: 2.7 cm
Weight: 3200 oz

## 2021-01-19 LAB — COMPREHENSIVE METABOLIC PANEL
ALT: 13 U/L (ref 0–44)
AST: 11 U/L — ABNORMAL LOW (ref 15–41)
Albumin: 2.5 g/dL — ABNORMAL LOW (ref 3.5–5.0)
Alkaline Phosphatase: 96 U/L (ref 38–126)
Anion gap: 8 (ref 5–15)
BUN: 13 mg/dL (ref 8–23)
CO2: 27 mmol/L (ref 22–32)
Calcium: 8.3 mg/dL — ABNORMAL LOW (ref 8.9–10.3)
Chloride: 99 mmol/L (ref 98–111)
Creatinine, Ser: 0.75 mg/dL (ref 0.44–1.00)
GFR, Estimated: >60 mL/min (ref >60)
Glucose, Bld: 151 mg/dL — ABNORMAL HIGH (ref 70–99)
Potassium: 4 mmol/L (ref 3.5–5.1)
Sodium: 134 mmol/L — ABNORMAL LOW (ref 135–145)
Total Bilirubin: 1.2 mg/dL (ref 0.3–1.2)
Total Protein: 7.4 g/dL (ref 6.5–8.1)

## 2021-01-19 MED ORDER — ENSURE MAX PROTEIN PO LIQD
11.0000 [oz_av] | Freq: Every day | ORAL | Status: DC
  Administered 2021-01-19 – 2021-01-27 (×6): 11 [oz_av] via ORAL

## 2021-01-19 MED ORDER — HYDROMORPHONE 1 MG/ML IV SOLN
INTRAVENOUS | Status: DC
Start: 2021-01-19 — End: 2021-01-19

## 2021-01-19 MED ORDER — PROSOURCE PLUS PO LIQD
30.0000 mL | Freq: Two times a day (BID) | ORAL | Status: DC
  Administered 2021-01-19 – 2021-01-27 (×14): 30 mL via ORAL
  Filled 2021-01-19 (×14): qty 30

## 2021-01-19 MED ORDER — DIPHENHYDRAMINE HCL 12.5 MG/5ML PO ELIX: 12.5000 mg | ORAL_SOLUTION | Freq: Four times a day (QID) | ORAL | Status: DC | PRN

## 2021-01-19 MED ORDER — SODIUM CHLORIDE 0.9% FLUSH: 9.0000 mL | INTRAVENOUS | Status: DC | PRN

## 2021-01-19 MED ORDER — ONDANSETRON HCL 4 MG/2ML IJ SOLN: 4.0000 mg | Freq: Four times a day (QID) | INTRAMUSCULAR | Status: DC | PRN

## 2021-01-19 MED ORDER — CEFAZOLIN SODIUM-DEXTROSE 2-4 GM/100ML-% IV SOLN
2.0000 g | Freq: Three times a day (TID) | INTRAVENOUS | Status: DC
  Administered 2021-01-20 – 2021-01-27 (×22): 2 g via INTRAVENOUS
  Filled 2021-01-19 (×25): qty 100

## 2021-01-19 MED ORDER — HYDROMORPHONE 1 MG/ML IV SOLN
INTRAVENOUS | Status: DC
Start: 2021-01-19 — End: 2021-01-22
  Administered 2021-01-20: 0.5 mg via INTRAVENOUS
  Administered 2021-01-20: 0.4 mg via INTRAVENOUS
  Administered 2021-01-20: 0.9 mg via INTRAVENOUS
  Administered 2021-01-20: 0.8 mg via INTRAVENOUS
  Administered 2021-01-20: 0.9 mg via INTRAVENOUS
  Administered 2021-01-21: 0.3 mg via INTRAVENOUS
  Administered 2021-01-21: 0.1 mg via INTRAVENOUS
  Administered 2021-01-21: 0.4 mg via INTRAVENOUS
  Administered 2021-01-21: 0.3 mg via INTRAVENOUS
  Administered 2021-01-21 – 2021-01-22 (×2): 0.5 mg via INTRAVENOUS
  Administered 2021-01-22: 0.6 mg via INTRAVENOUS
  Filled 2021-01-19 (×19): qty 30

## 2021-01-19 MED ORDER — NALOXONE HCL 0.4 MG/ML IJ SOLN: 0.4000 mg | INTRAMUSCULAR | Status: DC | PRN

## 2021-01-19 MED ORDER — VANCOMYCIN HCL 1250 MG/250ML IV SOLN
1250.0000 mg | INTRAVENOUS | Status: DC
  Administered 2021-01-19: 1250 mg via INTRAVENOUS
  Filled 2021-01-19: qty 250

## 2021-01-19 MED ORDER — DIPHENHYDRAMINE HCL 50 MG/ML IJ SOLN: 12.5000 mg | Freq: Four times a day (QID) | INTRAMUSCULAR | Status: DC | PRN

## 2021-01-19 NOTE — Progress Notes (Addendum)
Patient's PCA pump continues to go off due to patient's respirations decreasing. Md is aware. Will continue to monitor.

## 2021-01-19 NOTE — Progress Notes (Signed)
PHARMACY - PHYSICIAN COMMUNICATION CRITICAL VALUE ALERT - BLOOD CULTURE IDENTIFICATION (BCID)  Haley Singh is an 70 y.o. female who presented to [facility] on 01/17/2021 with sepsis secondary to possible facet joint infection.    Assessment:   Left L4/5 facet joint arthropathy with  CT-guided left L4-5 facet joint aspiration performed by IR yesterday.   8/17 BCx: 4/4 gpc 8/17 BCID: [id number]  8/17 abscess vertebra: pending  Name of physician (or Provider) Contacted: Samtani  Current antibiotics: vancomycin  Changes to prescribed antibiotics recommended:  Recommendations accepted by provider to discontinue vancomycin and initiate cefazolin 2 g IV every 8 hours  Results for orders placed or performed during the hospital encounter of 01/17/21  Blood Culture ID Panel (Reflexed) (Collected: 01/18/2021  5:08 PM)  Result Value Ref Range   Enterococcus faecalis NOT DETECTED NOT DETECTED   Enterococcus Faecium NOT DETECTED NOT DETECTED   Listeria monocytogenes NOT DETECTED NOT DETECTED   Staphylococcus species DETECTED (A) NOT DETECTED   Staphylococcus aureus (BCID) DETECTED (A) NOT DETECTED   Staphylococcus epidermidis NOT DETECTED NOT DETECTED   Staphylococcus lugdunensis NOT DETECTED NOT DETECTED   Streptococcus species NOT DETECTED NOT DETECTED   Streptococcus agalactiae NOT DETECTED NOT DETECTED   Streptococcus pneumoniae NOT DETECTED NOT DETECTED   Streptococcus pyogenes NOT DETECTED NOT DETECTED   A.calcoaceticus-baumannii NOT DETECTED NOT DETECTED   Bacteroides fragilis NOT DETECTED NOT DETECTED   Enterobacterales NOT DETECTED NOT DETECTED   Enterobacter cloacae complex NOT DETECTED NOT DETECTED   Escherichia coli NOT DETECTED NOT DETECTED   Klebsiella aerogenes NOT DETECTED NOT DETECTED   Klebsiella oxytoca NOT DETECTED NOT DETECTED   Klebsiella pneumoniae NOT DETECTED NOT DETECTED   Proteus species NOT DETECTED NOT DETECTED   Salmonella species NOT DETECTED NOT DETECTED    Serratia marcescens NOT DETECTED NOT DETECTED   Haemophilus influenzae NOT DETECTED NOT DETECTED   Neisseria meningitidis NOT DETECTED NOT DETECTED   Pseudomonas aeruginosa NOT DETECTED NOT DETECTED   Stenotrophomonas maltophilia NOT DETECTED NOT DETECTED   Candida albicans NOT DETECTED NOT DETECTED   Candida auris NOT DETECTED NOT DETECTED   Candida glabrata NOT DETECTED NOT DETECTED   Candida krusei NOT DETECTED NOT DETECTED   Candida parapsilosis NOT DETECTED NOT DETECTED   Candida tropicalis NOT DETECTED NOT DETECTED   Cryptococcus neoformans/gattii NOT DETECTED NOT DETECTED   Meth resistant mecA/C and MREJ NOT DETECTED NOT DETECTED    Royce Macadamia 01/19/2021  1:32 PM

## 2021-01-19 NOTE — Consult Note (Addendum)
[facility]    Date of Admission:  01/17/2021     Reason for Consult: mssa bacteremia; L4-5 vertebral OM/discitis    Referring Provider: Mahala Menghini   Lines:  Peripheral iv's  Abx: 8/18-c cefazolin  8/16-18 vanc        Assessment: 70 yo female lumbago, OA, htn/hlp, asthma, admitted 8/16 with progressive 7 day acute on chronic lower back pain, found to have mri imaging L4-5 vertebral om/discitis and mssa bacteremia  8/17 bcx mssa 8/17 IR aspirate (left L4-5 facet joint) cx in progress  Source usually unclear on these patients. But so far no obvious metastatic site of infection. Presumably this is hematogenous spread of mssa bacteremia. Will get a transthoracic echo.  If repeat bcx continues to be positive despite appropriate abx, would get TEE (if tte negative) and pan body ct  Plan: Change vanc to cefazolin F/u final culture Repeat blood culture tomorrow TTE 5.   Discussed with primary team    I spent 60 minute reviewing data/chart, and coordinating care and >50% direct face to face time providing counseling/discussing diagnostics/treatment plan with patient       ------------------------------------------------ Active Problems:   Osteomyelitis (HCC)    HPI: Haley Singh is a 70 y.o. female history migraine, lumbago, admitted 8/16 with sepsis/mssa bacteremia and L4-5 vertebral om  Patient was at baseline health with mild lower back pain. However, the past 7 days prior to admission noticed worsening lower back pain and malaise. on the day of admission woken up by severe lower back pain  Patient denies associated malaise, subjective f/c  No new LE numbness (chronic stable bilateral feet) or tingling/weakness or urinary/bowel incontinence  On admission is afebrile but with mild leukocytosis Mri as below Bcx returns mssa Same day IR aspirate (left L4-5 facet joint) cx is pending  She was started on vancomycin  She was  seen in ed 3 times a month prior for migraine and was noted to have mild leukocytosis similar to this admission    Family History  Problem Relation Age of Onset   Breast cancer Maternal Aunt    Heart disease Father    Heart Problems Father     Social History   Tobacco Use   Smoking status: Never   Smokeless tobacco: Never  Vaping Use   Vaping Use: Never used  Substance Use Topics   Alcohol use: Yes    Alcohol/week: 7.0 standard drinks    Types: 7 Standard drinks or equivalent per week    Comment: scotch or wine maybe 1 a night   Drug use: Never    Allergies  Allergen Reactions   Oxycontin [Oxycodone] Itching   Penicillins Other (See Comments)    unk Did it involve swelling of the face/tongue/throat, SOB, or low BP? Unknown Did it involve sudden or severe rash/hives, skin peeling, or any reaction on the inside of your mouth or nose? Unknown Did you need to seek medical attention at a hospital or doctor's office? Unknown When did it last happen?     over 10 years ago  If all above answers are "NO", may proceed with cephalosporin use.    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   Hyperlipidemia    Hypertension    followed by pcp  (11-10-2019 pt stated never had a stress test)   Insomnia    MDD (major depressive disorder)    Mild persistent asthma    followed by pcp and dr Belva Crome (pulmonologist)   RLS (restless legs syndrome)        Scheduled Meds:  (feeding supplement) PROSource Plus  30 mL Oral BID BM   amLODipine  5 mg Oral Daily   atorvastatin  40 mg Oral Daily   enoxaparin (LOVENOX) injection  40 mg Subcutaneous  Q24H   fluticasone  1 spray Each Nare BID   fluticasone furoate-vilanterol  1 puff Inhalation Daily   gabapentin  600 mg Oral QHS   HYDROmorphone   Intravenous Q4H   loratadine  10 mg Oral Daily   losartan  50 mg Oral Daily   methocarbamol  500 mg Oral TID   montelukast  10 mg Oral QHS   Ensure Max Protein  11 oz Oral Daily   traZODone  150 mg Oral QHS   venlafaxine XR  150 mg Oral q morning   Continuous Infusions:  vancomycin 1,250 mg (01/19/21 1031)   PRN Meds:.acetaminophen **OR** acetaminophen, albuterol, bisacodyl, celecoxib, diphenhydrAMINE **OR** diphenhydrAMINE, naloxone **AND** sodium chloride flush, ondansetron **OR** ondansetron (ZOFRAN) IV, ondansetron (ZOFRAN) IV, oxyCODONE-acetaminophen, senna-docusate, SUMAtriptan   OBJECTIVE: Blood pressure 136/64, pulse (!) 109, temperature 97.6 F (36.4 C), temperature source Oral, resp. rate 18, height 5\' 6"  (1.676 m), weight 90.7 kg, SpO2 91 %.  Physical Exam  General/constitutional: mild distress adjusting position in bed due to lower back pain, but otherwise pleasant HEENT: Normocephalic, PER, Conj Clear, EOMI, Oropharynx clear Neck supple CV: rrr no mrg Lungs: clear to auscultation, normal respiratory effort Abd: Soft, Nontender Ext: no edema Skin: No Rash Neuro: nonfocal MSK: in back brace Psych: alert/oriented   Lab Results Lab Results  Component Value Date   WBC 17.1 (H) 01/19/2021   HGB 10.0 (L) 01/19/2021   HCT 31.7 (L) 01/19/2021   MCV 90.8 01/19/2021   PLT 261 01/19/2021    Lab Results  Component Value Date   CREATININE 0.75 01/19/2021   BUN 13 01/19/2021   NA 134 (L) 01/19/2021   K 4.0 01/19/2021   CL 99 01/19/2021   CO2 27 01/19/2021    Lab Results  Component Value Date   ALT 13 01/19/2021   AST 11 (L) 01/19/2021   ALKPHOS 96 01/19/2021   BILITOT 1.2 01/19/2021      Microbiology: Recent Results (from the past 240 hour(s))  SARS CORONAVIRUS 2 (TAT 6-24 HRS) Nasopharyngeal Nasopharyngeal  Swab     Status: None   Collection Time: 01/17/21  5:42 PM   Specimen: Nasopharyngeal Swab  Result Value Ref Range Status   SARS Coronavirus 2 NEGATIVE NEGATIVE Final    Comment: (NOTE) SARS-CoV-2 target nucleic acids are NOT DETECTED.  The SARS-CoV-2 RNA is generally detectable in upper and lower respiratory specimens during the acute phase of infection. Negative results do not preclude SARS-CoV-2 infection, do not rule out co-infections with other pathogens, and should not be used as the sole basis for treatment or other patient management decisions. Negative results must be combined with clinical observations, patient history, and epidemiological information. The expected result is Negative.  Fact Sheet for Patients: 01/19/21  Fact Sheet for  Healthcare Providers: quierodirigir.com  This test is not yet approved or cleared by the [place] and  has been authorized for detection and/or diagnosis of SARS-CoV-2 by FDA under an Emergency Use Authorization (EUA). This EUA will remain  in effect (meaning this test can be used) for the duration of the COVID-19 declaration under Se ction 564(b)(1) of the Act, 21 U.S.C. section 360bbb-3(b)(1), unless the authorization is terminated or revoked sooner.  Performed at [facility], [address] [place], [place] [address]   Aerobic/Anaerobic Culture w Gram Stain (surgical/deep wound)     Status: None (Preliminary result)   Collection Time: 01/18/21  2:52 PM   Specimen: Abscess  Result Value Ref Range Status   Specimen Description   Final    ABSCESS VERTEBRA Performed at [facility], [address] [place], [place] [address]    Special Requests   Final    NONE Performed at [facility], [address] [place], [place] [address]    Gram Stain   Final    RARE WBC PRESENT, PREDOMINANTLY MONONUCLEAR NO ORGANISMS SEEN     Culture   Final    NO GROWTH < 24 HOURS Performed at [facility], [address], [place], [place] [address]    Report Status PENDING  Incomplete  Culture, blood (Routine X 2) w Reflex to ID Panel     Status: None (Preliminary result)   Collection Time: 01/18/21  5:08 PM   Specimen: BLOOD  Result Value Ref Range Status   Specimen Description   Final    BLOOD LEFT ANTECUBITAL Performed at [facility], [address], [place], [place] [address]    Special Requests   Final    BOTTLES DRAWN AEROBIC AND ANAEROBIC Blood Culture adequate volume Performed at [facility], [address], [place], [place] [address]    Culture  Setup Time   Final    GRAM POSITIVE COCCI IN CLUSTERS IN BOTH AEROBIC AND ANAEROBIC BOTTLES CRITICAL VALUE NOTED.  VALUE IS CONSISTENT WITH PREVIOUSLY REPORTED AND CALLED VALUE. Performed at [facility], [address], [place], [place] [address]    Culture GRAM POSITIVE COCCI  Final   Report Status PENDING  Incomplete  Culture, blood (Routine X 2) w Reflex to ID Panel     Status: None (Preliminary result)   Collection Time: 01/18/21  5:08 PM   Specimen: BLOOD RIGHT HAND  Result Value Ref Range Status   Specimen Description   Final    BLOOD RIGHT HAND Performed at [facility], [address] [place], [place] [address]    Special Requests   Final    BOTTLES DRAWN AEROBIC AND ANAEROBIC Blood Culture adequate volume Performed at [facility], [address] [place], [place] [address]    Culture  Setup Time   Final    GRAM POSITIVE COCCI IN CLUSTERS IN BOTH AEROBIC AND ANAEROBIC BOTTLES CRITICAL RESULT CALLED TO, READ BACK BY AND VERIFIED WITH: PHARMD MARY SWAYNE 01/19/2021 @1204  BY JW Performed at [facility], [address], [place], [place]    Culture [facility] POSITIVE COCCI  Final   Report Status PENDING  Incomplete  Blood Culture ID Panel (Reflexed)     Status:  Abnormal   Collection Time: 01/18/21  5:08 PM  Result Value Ref Range Status   Enterococcus faecalis NOT DETECTED NOT DETECTED Final   Enterococcus Faecium  NOT DETECTED NOT DETECTED Final   Listeria monocytogenes NOT DETECTED NOT DETECTED Final   Staphylococcus species DETECTED (A) NOT DETECTED Final    Comment: CRITICAL RESULT CALLED TO, READ BACK BY AND VERIFIED WITH: PHARMD MARY SWAYNE 01/19/2021 @1204  BY JW    Staphylococcus aureus (BCID) DETECTED (A) NOT DETECTED Final    Comment: CRITICAL RESULT CALLED TO, READ BACK BY AND VERIFIED WITH: PHARMD MARY SWAYNE 01/19/2021 @1204  BY JW    Staphylococcus epidermidis NOT DETECTED NOT DETECTED Final   Staphylococcus lugdunensis NOT DETECTED NOT DETECTED Final   Streptococcus species NOT DETECTED NOT DETECTED Final   Streptococcus agalactiae NOT DETECTED NOT DETECTED Final   Streptococcus pneumoniae NOT DETECTED NOT DETECTED Final   Streptococcus pyogenes NOT DETECTED NOT DETECTED Final   A.calcoaceticus-baumannii NOT DETECTED NOT DETECTED Final   Bacteroides fragilis NOT DETECTED NOT DETECTED Final   Enterobacterales NOT DETECTED NOT DETECTED Final   Enterobacter cloacae complex NOT DETECTED NOT DETECTED Final   Escherichia coli NOT DETECTED NOT DETECTED Final   Klebsiella aerogenes NOT DETECTED NOT DETECTED Final   Klebsiella oxytoca NOT DETECTED NOT DETECTED Final   Klebsiella pneumoniae NOT DETECTED NOT DETECTED Final   Proteus species NOT DETECTED NOT DETECTED Final   Salmonella species NOT DETECTED NOT DETECTED Final   Serratia marcescens NOT DETECTED NOT DETECTED Final   Haemophilus influenzae NOT DETECTED NOT DETECTED Final   Neisseria meningitidis NOT DETECTED NOT DETECTED Final   Pseudomonas aeruginosa NOT DETECTED NOT DETECTED Final   Stenotrophomonas maltophilia NOT DETECTED NOT DETECTED Final   Candida albicans NOT DETECTED NOT DETECTED Final   Candida auris NOT DETECTED NOT DETECTED Final   Candida glabrata NOT DETECTED  NOT DETECTED Final   Candida krusei NOT DETECTED NOT DETECTED Final   Candida parapsilosis NOT DETECTED NOT DETECTED Final   Candida tropicalis NOT DETECTED NOT DETECTED Final   Cryptococcus neoformans/gattii NOT DETECTED NOT DETECTED Final   Meth resistant mecA/C and MREJ NOT DETECTED NOT DETECTED Final    Comment: Performed at [facility], [address] [place], [place] [id number]     Serology:    Imaging: If present, new imagings (plain films, ct scans, and mri) have been personally visualized and interpreted; radiology reports have been reviewed. Decision making incorporated into the Impression / Recommendations.  8/16 mri lumbar spine 1. Increased T2 signal about and disruption of the left L4-L5 facets, with increased T2 signal in posterior L5 vertebral body, left pedicle, and surrounding soft tissues. Edema and osseous destruction cause mild spinal canal narrowing. This could represent acute fracture or infection. Correlate with history of trauma and consider CT for further osseous evaluation. 2. L2-L3 mild canal, moderate left neural foraminal narrowing and mild-to-moderate right neural foraminal narrowing.  Raymondo Bandrung T Cela Newcom, MD [facility] [phone] pager    01/19/2021, 12:35 PM

## 2021-01-19 NOTE — Progress Notes (Signed)
Pharmacy Antibiotic Note  Haley Singh is a 70 y.o. female admitted on 01/17/2021 with sepsis secondary to possible facet joint infection.  Pharmacy has been consulted for vancomycin dosing.  01/17/21 1603 - Vancomycin 1500 mg IV x 1 01/18/21 IR performed CT-guided left L4-5 facet joint aspiration  Plan: Vancomycin 1250 mg IV Q 24 hrs (Goal AUC 400-550, Expected AUC: 512.4, SCr used: rounded to 0.8) Monitor clinical picture, renal function, vancomycin levels if indicated F/U C&S, abx deescalation / LOT    Height: 5\' 6"  (167.6 cm) Weight: 90.7 kg (200 lb) IBW/kg (Calculated) : 59.3  Temp (24hrs), Avg:98.6 F (37 C), Min:97.6 F (36.4 C), Max:99.7 F (37.6 C)  Recent Labs  Lab 01/17/21 1124 01/18/21 0424 01/19/21 0552  WBC 12.6* 14.4* 17.1*  CREATININE 0.80  --  0.75    Estimated Creatinine Clearance: 72.1 mL/min (by C-G formula based on SCr of 0.75 mg/dL).    Allergies  Allergen Reactions   Oxycontin [Oxycodone] Itching   Penicillins Other (See Comments)    unk Did it involve swelling of the face/tongue/throat, SOB, or low BP? Unknown Did it involve sudden or severe rash/hives, skin peeling, or any reaction on the inside of your mouth or nose? Unknown Did you need to seek medical attention at a hospital or doctor's office? Unknown When did it last happen?     over 10 years ago  If all above answers are "NO", may proceed with cephalosporin use.    Tetracyclines & Related Itching    Antimicrobials this admission: 8/16 vancomycin 1500mg  IV x 1 8/18 vancomycin >>   Dose adjustments this admission:   Microbiology results: 8/17 BCx: pending 8/17 ascess vertebra: pending  Thank you for allowing pharmacy to be a part of this patient's care.  Malisa Ruggiero P. 9/17, PharmD, BCPS Clinical Pharmacist [place] Please utilize Amion for appropriate phone number to reach the unit pharmacist [facility]) 01/19/2021 9:05 AM

## 2021-01-19 NOTE — Progress Notes (Signed)
  Echocardiogram 2D Echocardiogram has been performed.  [name] 01/19/2021, 4:00 PM

## 2021-01-19 NOTE — Progress Notes (Addendum)
PROGRESS NOTE   Haley Singh  NTZ:[id number] DOB: 21-Jan-1951 DOA: 01/17/2021 PCP: Katherina Mires, MD   Brief Narrative:   70 year old white female Asthma HTN depression hyperlipidemia previous bilateral renal stones with double-J stent status post lithotripsy 2008--subsequent multiple procedures 08/26/2019 and 10/12/2019 Small bowel obstruction 2005 requiring laparotomy for small bowel resection and primary anastomosis  Admitted 8/16 with severe back pain Work-up showed  L4-L5, L5-S1 T2 signal in L5 vertebral body with mild canal narrowing?  Infection WBC 12 ESR over 100 CRP 19 no phlegmon on CT Neurosurgery consulted recommended conservative management  Grew MSSA and ID consulted  Hospital-Problem based course   MSSA Sepsis on admission secondary to possible facet joint infection? Follow cult from facet joint aspiration 8/18 Started PCA and adjust pain meds accordingly-- Hold oxycodone for now Continue Robaxin 500 x 3 times daily gabapentin 600 at bedtime continue Celebrex 200-400 for moderate pain Vancomycin changed to Ancef ECHO performed--ID input appreciated Prior bilateral renal stones status post lithotripsy 2008 2021 Monitor trends Migraines Continue Imitrex 50 daily as needed HTN Uncontrolled Continue amlodipine 5, losartan 50 Depression Continue trazodone 150 at bedtime, Effexor 1:50 AM   DVT prophylaxis: Lovenox Code Status: Full Family Communication: None Disposition:  Status is: Inpatient  Remains inpatient appropriate because:Hemodynamically unstable, Ongoing active pain requiring inpatient pain management, and Altered mental status  Dispo: The patient is from: Home              Anticipated d/c is to: SNF              Patient currently is not medically stable to d/c.   Difficult to place patient No   Consultants:  Neurosurgery consulted IR consulted  Procedures: None yet  Antimicrobials:     Subjective:  Somnolent on PCA-getting ECHO.  No  cp no fever no chill no diarr   Objective: Vitals:   01/18/21 2118 01/19/21 0534 01/19/21 0816 01/19/21 1320  BP: 131/67 136/64  99/63  Pulse: (!) 109 (!) 109  79  Resp: _0 Temp: 99.7 F (37.6 C) 97.6 F (36.4 C)  98.6 F (37 C)  TempSrc: Oral Oral  Oral  SpO2: (!) 83% (!) 82% 91% (!) 87%  Weight:      Height:        Intake/Output Summary (Last 24 hours) at 01/19/2021 1501 Last data filed at 01/19/2021 1121 Gross per 24 hour  Intake 600 ml  Output 300 ml  Net 300 ml    Filed Weights   01/17/21 1028  Weight: 90.7 kg    Examination:  EOMI NCAT slightly somnolent CTA B Brace in place S1 s2 no m  Data Reviewed: personally reviewed   CBC    Component Value Date/Time   WBC 17.1 (H) 01/19/2021 0552   RBC 3.49 (L) 01/19/2021 0552   HGB 10.0 (L) 01/19/2021 0552   HCT 31.7 (L) 01/19/2021 0552   PLT 261 01/19/2021 0552   MCV 90.8 01/19/2021 0552   MCH 28.7 01/19/2021 0552   MCHC 31.5 01/19/2021 0552   RDW 14.6 01/19/2021 0552   LYMPHSABS 0.5 (L) 01/19/2021 0552   MONOABS 0.9 01/19/2021 0552   EOSABS 0.0 01/19/2021 0552   BASOSABS 0.0 01/19/2021 0552   CMP Latest Ref Rng & Units 01/19/2021 01/17/2021 12/16/2020  Glucose 70 - 99 mg/dL 151(H) 130(H) 155(H)  BUN 8 - 23 mg/dL _1 Creatinine 0.44 - 1.00 mg/dL 0.75 0.80 0.91  Sodium 135 - 145 mmol/L  134(L) 138 134(L)  Potassium 3.5 - 5.1 mmol/L 4.0 3.6 3.0(L)  Chloride 98 - 111 mmol/L 99 101 100  CO2 22 - 32 mmol/L _0 Calcium 8.9 - 10.3 mg/dL 8.3(L) 8.5(L) 8.5(L)  Total Protein 6.5 - 8.1 g/dL 7.4 8.0 7.4  Total Bilirubin 0.3 - 1.2 mg/dL 1.2 1.0 1.8(H)  Alkaline Phos 38 - 126 U/L 96 94 94  AST 15 - 41 U/L 11(L) 13(L) 17  ALT 0 - 44 U/L _1 Radiology Studies: CT Lumbar Spine Wo Contrast  Result Date: 01/17/2021 CLINICAL DATA:  Spine fracture, lumbosacral, pathological concern for lumbar fracture. Spinal fracture, back pain, no new injury. EXAM: CT LUMBAR SPINE WITHOUT CONTRAST  TECHNIQUE: Multidetector CT imaging of the lumbar spine was performed without intravenous contrast administration. Multiplanar CT image reconstructions were also generated. COMPARISON:  Lumbar spine MRI 01/17/2021. FINDINGS: Segmentation: Five lumbar vertebrae. The caudal most well-formed intervertebral disc space is designated L5-S1. Alignment: Trace L1-L2 and L2-L3 grade 1 retrolisthesis. 6 mm L4-L5 grade 1 anterolisthesis. Vertebrae: No lumbar vertebral compression fracture. Multilevel degenerative endplate irregularity. Schmorl node within the L3 superior endplate and L2 inferior endplate. No definite acute fracture is identified. There is prominent irregularity of the left L4 and L5 articular pillars with possible sites of erosion. Associated widening of the left L4-L5 facet joint. To a lesser extent, there is irregularity of the right L4 and L5 articular pillars. Possible early osseous fusion across the L5-S1 disc space. Paraspinal and other soft tissues: Aortoiliac atherosclerosis. Bilateral nephrolithiasis. Disc levels: Multilevel degenerative changes, more fully characterized on the MRI performed earlier today. Please refer to this prior report for further description. IMPRESSION: No definite acute fracture of the lumbar spine. Prominent irregularity of the left L4 and L5 articular pillars with possible sites of erosion. Associated widening of the left L4-L5 facet joint. To a lesser extent, there is irregularity of the right L4 and L5 articular pillars. These findings may reflect septic arthritis or advanced facet arthrosis. Clinical correlation is recommended. Additionally, MRI follow-up for surveillance should be considered. Acute facet joint capsular injury on the left at L4-L5 cannot be excluded, particularly if there has been recent trauma. 4 mm L4-L5 grade 1 anterolisthesis. Trace L1-L2, L2-L3 and L3-L4 grade 1 retrolisthesis. Lumbar spondylosis, more fully characterized on the lumbar spine MRI  performed earlier today. Please refer to this prior report for further description. Aortic Atherosclerosis (ICD10-I70.0). Bilateral nephrolithiasis. Electronically Signed   By: Kellie Simmering D.O.   On: 01/17/2021 18:17   CT IMAGE GUIDED DRAINAGE BY PERCUTANEOUS CATHETER  Result Date: 01/18/2021 INDICATION: L4-5 facet arthropathy EXAM: CT GUIDED LEFT LUMBAR L4/5 FACET JOINT ASPIRATION MEDICATIONS: None. ANESTHESIA/SEDATION: Local anesthetic was administered. COMPLICATIONS: None immediate. Estimated blood loss: <5 mL PROCEDURE: Informed written consent was obtained from the patient after a thorough discussion of the procedural risks, benefits and alternatives. All questions were addressed. Maximal Sterile Barrier Technique was utilized including caps, mask, sterile gowns, sterile gloves, sterile drape, hand hygiene and skin antiseptic. A timeout was performed prior to the initiation of the procedure. The patient was positioned prone and non-contrast localization CT was performed of the pelvis to demonstrate the L4-5 facet joint space. Maximal barrier sterile technique utilized including caps, mask, sterile gowns, sterile gloves, large sterile drape, hand hygiene, and chlorhexidine prep. Under sterile conditions and local anesthesia, an 9 cm, 18 gauge Yueh needle was advanced into the left L4-5 facet joint space. Needle position was confirmed with  CT imaging. Aspiration was performed yielding approximately 12 mL of sanguinous aspirate. Needle was removed. Hemostasis was obtained with compression. The patient tolerated the procedure well. Samples were submitted for culture. IMPRESSION: Successful CT-guided aspiration of left lumbar L4/5 facet joint, as above. Michaelle Birks, MD [facility] Electronically Signed   By: Michaelle Birks M.D.   On: 01/18/2021 16:41     Scheduled Meds:  (feeding supplement) PROSource Plus  30 mL Oral BID BM   amLODipine  5 mg Oral  Daily   atorvastatin  40 mg Oral Daily   enoxaparin (LOVENOX) injection  40 mg Subcutaneous Q24H   fluticasone  1 spray Each Nare BID   fluticasone furoate-vilanterol  1 puff Inhalation Daily   gabapentin  600 mg Oral QHS   HYDROmorphone   Intravenous Q4H   loratadine  10 mg Oral Daily   losartan  50 mg Oral Daily   methocarbamol  500 mg Oral TID   montelukast  10 mg Oral QHS   Ensure Max Protein  11 oz Oral Daily   traZODone  150 mg Oral QHS   venlafaxine XR  150 mg Oral q morning   Continuous Infusions:  [START ON 01/20/2021]  ceFAZolin (ANCEF) IV       LOS: 2 days   Time spent: 20  Nita Sells, MD [facility] To contact the attending provider between 7A-7P or the covering provider during after hours 7P-7A, please log into the web site [email or web address] and access using universal [place] password for that web site. If you do not have the password, please call the hospital operator.  01/19/2021, 3:01 PM

## 2021-01-20 DIAGNOSIS — B9561 Methicillin susceptible Staphylococcus aureus infection as the cause of diseases classified elsewhere: Secondary | ICD-10-CM

## 2021-01-20 DIAGNOSIS — M4646 Discitis, unspecified, lumbar region: Secondary | ICD-10-CM | POA: Diagnosis not present

## 2021-01-20 DIAGNOSIS — R7881 Bacteremia: Secondary | ICD-10-CM

## 2021-01-20 DIAGNOSIS — M545 Low back pain, unspecified: Secondary | ICD-10-CM | POA: Diagnosis not present

## 2021-01-20 LAB — CBC WITH DIFFERENTIAL/PLATELET
Abs Immature Granulocytes: 0.15 10*3/uL — ABNORMAL HIGH (ref 0.00–0.07)
Basophils Absolute: 0 10*3/uL (ref 0.0–0.1)
Basophils Relative: 0 %
Eosinophils Absolute: 0 10*3/uL (ref 0.0–0.5)
Eosinophils Relative: 0 %
HCT: 28 % — ABNORMAL LOW (ref 36.0–46.0)
Hemoglobin: 9 g/dL — ABNORMAL LOW (ref 12.0–15.0)
Immature Granulocytes: 1 %
Lymphocytes Relative: 6 %
Lymphs Abs: 0.8 10*3/uL (ref 0.7–4.0)
MCH: 29.2 pg (ref 26.0–34.0)
MCHC: 32.1 g/dL (ref 30.0–36.0)
MCV: 90.9 fL (ref 80.0–100.0)
Monocytes Absolute: 1.3 10*3/uL — ABNORMAL HIGH (ref 0.1–1.0)
Monocytes Relative: 9 %
Neutro Abs: 11.8 10*3/uL — ABNORMAL HIGH (ref 1.7–7.7)
Neutrophils Relative %: 84 %
Platelets: 269 10*3/uL (ref 150–400)
RBC: 3.08 MIL/uL — ABNORMAL LOW (ref 3.87–5.11)
RDW: 14.7 % (ref 11.5–15.5)
WBC: 14 10*3/uL — ABNORMAL HIGH (ref 4.0–10.5)
nRBC: 0 % (ref 0.0–0.2)

## 2021-01-20 LAB — COMPREHENSIVE METABOLIC PANEL
ALT: 13 U/L (ref 0–44)
AST: 14 U/L — ABNORMAL LOW (ref 15–41)
Albumin: 2.4 g/dL — ABNORMAL LOW (ref 3.5–5.0)
Alkaline Phosphatase: 87 U/L (ref 38–126)
Anion gap: 7 (ref 5–15)
BUN: 16 mg/dL (ref 8–23)
CO2: 28 mmol/L (ref 22–32)
Calcium: 8.1 mg/dL — ABNORMAL LOW (ref 8.9–10.3)
Chloride: 99 mmol/L (ref 98–111)
Creatinine, Ser: 0.77 mg/dL (ref 0.44–1.00)
GFR, Estimated: >60 mL/min (ref >60)
Glucose, Bld: 115 mg/dL — ABNORMAL HIGH (ref 70–99)
Potassium: 3.5 mmol/L (ref 3.5–5.1)
Sodium: 134 mmol/L — ABNORMAL LOW (ref 135–145)
Total Bilirubin: 0.7 mg/dL (ref 0.3–1.2)
Total Protein: 7.2 g/dL (ref 6.5–8.1)

## 2021-01-20 MED ORDER — LIDOCAINE 5 % EX PTCH
1.0000 | MEDICATED_PATCH | CUTANEOUS | Status: DC
  Administered 2021-01-20 – 2021-01-27 (×8): 1 via TRANSDERMAL
  Filled 2021-01-20 (×9): qty 1

## 2021-01-20 MED ORDER — POLYETHYLENE GLYCOL 3350 17 G PO PACK
17.0000 g | PACK | Freq: Every day | ORAL | Status: DC
  Administered 2021-01-20: 17 g via ORAL
  Filled 2021-01-20 (×2): qty 1

## 2021-01-20 NOTE — Progress Notes (Addendum)
PROGRESS NOTE   ANALYSE ANGST  URK:[id number] DOB: September 06, 1950 DOA: 01/17/2021 PCP: Katherina Mires, MD   Brief Narrative:   70 year old white female Asthma HTN depression hyperlipidemia previous bilateral renal stones with double-J stent status post lithotripsy 2008--subsequent multiple procedures 08/26/2019 and 10/12/2019 Small bowel obstruction 2005 requiring laparotomy for small bowel resection and primary anastomosis  Admitted 8/16 with severe back pain Work-up showed  L4-L5, L5-S1 T2 signal in L5 vertebral body with mild canal narrowing?  Infection WBC 12 ESR over 100 CRP 19 no phlegmon on CT Neurosurgery consulted recommended conservative management  Grew MSSA and ID consulted  Hospital-Problem based course   MSSA Sepsis on admission secondary to possible facet joint infection? Follow cult from facet joint aspiration 8/17 PCA started 8/18 and will be adjusted to oral meds -All oral opiates on hold at this time Continue Robaxin 500 x 3 times daily Increase gabapentin 600-900 as she is now having neuropathic symptoms from 600-908/19 as she is having worsening neuropathy --monitor mentation continue Celebrex 200-400 for moderate pain Vancomycin changed to Ancef per ID ECHO performed--ID input appreciated Prior bilateral renal stones status post lithotripsy 2008 2021 Monitor trends Migraines Continue Imitrex 50 daily as needed HTN Uncontrolled Continue amlodipine 5, losartan 50 Depression Continue trazodone 150 at bedtime, Effexor 150 qd Neuropathy NOS Continue gabapentin 600 at bedtime, can continue Robaxin 500 3 times daily   DVT prophylaxis: Lovenox Code Status: Full Family Communication: None Disposition:  Status is: Inpatient  Remains inpatient appropriate because:Hemodynamically unstable, Ongoing active pain requiring inpatient pain management, and Altered mental status  Dispo: The patient is from: Home              Anticipated d/c is to: SNF               Patient currently is not medically stable to d/c.   Difficult to place patient No   Consultants:  Neurosurgery consulted IR consulted  Procedures: None yet  Antimicrobials:     Subjective:  Doing fair No chest pain fever however feels weak-could not get up to the commode Wonders how she will manage at home Long discussion with her that her lumbar infection is indolent and painful and will take several weeks for the pain to get durably better-she understands She is interested in getting therapy to see   Objective: Vitals:   01/20/21 0055 01/20/21 0420 01/20/21 0516 01/20/21 0730  BP: 119/63  (!) 122/59   Pulse: 83  81   Resp: 17 18 20    Temp: 97.8 F (36.6 C)  97.8 F (36.6 C)   TempSrc: Oral  Oral   SpO2: 98% 99% 100% 97%  Weight:      Height:        Intake/Output Summary (Last 24 hours) at 01/20/2021 1020 Last data filed at 01/20/2021 0600 Gross per 24 hour  Intake 220 ml  Output 600 ml  Net -380 ml    Filed Weights   01/17/21 1028  Weight: 90.7 kg    Examination:   Less somnolent seems to be in distress and discomfort CTA B no added sound no rales no rhonchi Abdomen soft ROM intact she is weak and cannot straight leg raise her legs completely off the bed  Data Reviewed: personally reviewed   CBC    Component Value Date/Time   WBC 14.0 (H) 01/20/2021 0432   RBC 3.08 (L) 01/20/2021 0432   HGB 9.0 (L) 01/20/2021 0432   HCT 28.0 (L) 01/20/2021 8315  PLT 269 01/20/2021 0432   MCV 90.9 01/20/2021 0432   MCH 29.2 01/20/2021 0432   MCHC 32.1 01/20/2021 0432   RDW 14.7 01/20/2021 0432   LYMPHSABS 0.8 01/20/2021 0432   MONOABS 1.3 (H) 01/20/2021 0432   EOSABS 0.0 01/20/2021 0432   BASOSABS 0.0 01/20/2021 0432   CMP Latest Ref Rng & Units 01/20/2021 01/19/2021 01/17/2021  Glucose 70 - 99 mg/dL 115(H) 151(H) 130(H)  BUN 8 - 23 mg/dL 16 13 9   Creatinine 0.44 - 1.00 mg/dL 0.77 0.75 0.80  Sodium 135 - 145 mmol/L 134(L) 134(L) 138  Potassium 3.5 - 5.1  mmol/L 3.5 4.0 3.6  Chloride 98 - 111 mmol/L 99 99 101  CO2 22 - 32 mmol/L 28 27 26   Calcium 8.9 - 10.3 mg/dL 8.1(L) 8.3(L) 8.5(L)  Total Protein 6.5 - 8.1 g/dL 7.2 7.4 8.0  Total Bilirubin 0.3 - 1.2 mg/dL 0.7 1.2 1.0  Alkaline Phos 38 - 126 U/L 87 96 94  AST 15 - 41 U/L 14(L) 11(L) 13(L)  ALT 0 - 44 U/L 13 13 15      Radiology Studies: ECHOCARDIOGRAM COMPLETE  Result Date: 01/19/2021    ECHOCARDIOGRAM REPORT   Patient Name:   Haley Singh Date of Exam: 01/19/2021 Medical Rec #:  [id number]       Height:       66.0 in Accession #:    [id number]      Weight:       200.0 lb Date of Birth:  [birth year]        BSA:          2.000 m Patient Age:    44 years        BP:           136/64 mmHg Patient Gender: F               HR:           87 bpm. Exam Location:  Inpatient Procedure: 2D Echo, Color Doppler and Cardiac Doppler Indications:    Bacteremia  History:        Patient has prior history of Echocardiogram examinations, most                 recent 08/06/2016. Risk Factors:Dyslipidemia and Hypertension.  Sonographer:    Bernadene Person RDCS Referring Phys: [id number] [employer]  1. Left ventricular ejection fraction, by estimation, is 60 to 65%. The left ventricle has normal function. The left ventricle has no regional wall motion abnormalities. There is moderate asymmetric left ventricular hypertrophy of the basal-septal segment. Left ventricular diastolic parameters were normal.  2. Right ventricular systolic function is normal. The right ventricular size is normal. There is normal pulmonary artery systolic pressure. The estimated right ventricular systolic pressure is 29.5 mmHg.  3. The mitral valve is normal in structure. Trivial mitral valve regurgitation. No evidence of mitral stenosis.  4. The aortic valve was not well visualized. Aortic valve regurgitation is not visualized. No aortic stenosis is present.  5. The inferior vena cava is normal in size with <50% respiratory variability, suggesting  right atrial pressure of 8 mmHg. Conclusion(s)/Recommendation(s): No vegetation seen. If high clinical suspicion for endocarditis, recommend TEE. FINDINGS  Left Ventricle: Left ventricular ejection fraction, by estimation, is 60 to 65%. The left ventricle has normal function. The left ventricle has no regional wall motion abnormalities. The left ventricular internal cavity size was normal in size. There is  moderate asymmetric left ventricular hypertrophy  of the basal-septal segment. Left ventricular diastolic parameters were normal. Right Ventricle: The right ventricular size is normal. No increase in right ventricular wall thickness. Right ventricular systolic function is normal. There is normal pulmonary artery systolic pressure. The tricuspid regurgitant velocity is 2.23 m/s, and  with an assumed right atrial pressure of 8 mmHg, the estimated right ventricular systolic pressure is 22.0 mmHg. Left Atrium: Left atrial size was normal in size. Right Atrium: Right atrial size was normal in size. Pericardium: There is no evidence of pericardial effusion. Mitral Valve: The mitral valve is normal in structure. Trivial mitral valve regurgitation. No evidence of mitral valve stenosis. Tricuspid Valve: The tricuspid valve is normal in structure. Tricuspid valve regurgitation is trivial. Aortic Valve: The aortic valve was not well visualized. Aortic valve regurgitation is not visualized. No aortic stenosis is present. Pulmonic Valve: The pulmonic valve was not well visualized. Pulmonic valve regurgitation is trivial. Aorta: The aortic root and ascending aorta are structurally normal, with no evidence of dilitation. Venous: The inferior vena cava is normal in size with less than 50% respiratory variability, suggesting right atrial pressure of 8 mmHg. IAS/Shunts: The interatrial septum was not well visualized.  LEFT VENTRICLE PLAX 2D LVIDd:         4.10 cm  Diastology LVIDs:         2.70 cm  LV e' medial:    7.75 cm/s LV PW:          1.20 cm  LV E/e' medial:  10.7 LV IVS:        1.20 cm  LV e' lateral:   9.38 cm/s LVOT diam:     2.00 cm  LV E/e' lateral: 8.9 LV SV:         59 LV SV Index:   30 LVOT Area:     3.14 cm  RIGHT VENTRICLE RV S prime:     15.40 cm/s TAPSE (M-mode): 2.6 cm LEFT ATRIUM             Index       RIGHT ATRIUM           Index LA diam:        3.30 cm 1.65 cm/m  RA Area:     13.90 cm LA Vol (A2C):   24.2 ml 12.10 ml/m RA Volume:   33.20 ml  16.60 ml/m LA Vol (A4C):   22.5 ml 11.25 ml/m LA Biplane Vol: 23.9 ml 11.95 ml/m  AORTIC VALVE             PULMONIC VALVE LVOT Vmax:   103.00 cm/s PR End Diast Vel: 5.95 msec LVOT Vmean:  66.400 cm/s LVOT VTI:    0.189 m  AORTA Ao Root diam: 3.80 cm Ao Asc diam:  3.50 cm MITRAL VALVE               TRICUSPID VALVE MV Area (PHT): 2.73 cm    TR Peak grad:   19.9 mmHg MV Decel Time: 278 msec    TR Vmax:        223.00 cm/s MV E velocity: 83.10 cm/s MV A velocity: 88.30 cm/s  SHUNTS MV E/A ratio:  0.94        Systemic VTI:  0.19 m                            Systemic Diam: 2.00 cm Oswaldo Milian MD Electronically signed by Oswaldo Milian MD Signature Date/Time:  01/19/2021/5:28:16 PM    Final    CT IMAGE GUIDED DRAINAGE BY PERCUTANEOUS CATHETER  Result Date: 01/18/2021 INDICATION: L4-5 facet arthropathy EXAM: CT GUIDED LEFT LUMBAR L4/5 FACET JOINT ASPIRATION MEDICATIONS: None. ANESTHESIA/SEDATION: Local anesthetic was administered. COMPLICATIONS: None immediate. Estimated blood loss: <5 mL PROCEDURE: Informed written consent was obtained from the patient after a thorough discussion of the procedural risks, benefits and alternatives. All questions were addressed. Maximal Sterile Barrier Technique was utilized including caps, mask, sterile gowns, sterile gloves, sterile drape, hand hygiene and skin antiseptic. A timeout was performed prior to the initiation of the procedure. The patient was positioned prone and non-contrast localization CT was performed of the pelvis to  demonstrate the L4-5 facet joint space. Maximal barrier sterile technique utilized including caps, mask, sterile gowns, sterile gloves, large sterile drape, hand hygiene, and chlorhexidine prep. Under sterile conditions and local anesthesia, an 9 cm, 18 gauge Yueh needle was advanced into the left L4-5 facet joint space. Needle position was confirmed with CT imaging. Aspiration was performed yielding approximately 12 mL of sanguinous aspirate. Needle was removed. Hemostasis was obtained with compression. The patient tolerated the procedure well. Samples were submitted for culture. IMPRESSION: Successful CT-guided aspiration of left lumbar L4/5 facet joint, as above. Michaelle Birks, MD [facility] Radiology Electronically Signed   By: Michaelle Birks M.D.   On: 01/18/2021 16:41     Scheduled Meds:  (feeding supplement) PROSource Plus  30 mL Oral BID BM   amLODipine  5 mg Oral Daily   atorvastatin  40 mg Oral Daily   enoxaparin (LOVENOX) injection  40 mg Subcutaneous Q24H   fluticasone  1 spray Each Nare BID   fluticasone furoate-vilanterol  1 puff Inhalation Daily   gabapentin  600 mg Oral QHS   HYDROmorphone   Intravenous Q4H   loratadine  10 mg Oral Daily   losartan  50 mg Oral Daily   methocarbamol  500 mg Oral TID   montelukast  10 mg Oral QHS   Ensure Max Protein  11 oz Oral Daily   traZODone  150 mg Oral QHS   venlafaxine XR  150 mg Oral q morning   Continuous Infusions:   ceFAZolin (ANCEF) IV 2 g (01/20/21 0530)     LOS: 3 days   Time spent: 30  Nita Sells, MD [facility] To contact the attending provider between 7A-7P or the covering provider during after hours 7P-7A, please log into the web site [email or web address] and access using universal [employer] password for that web site. If you do not have the password, please call the hospital operator.  01/20/2021, 10:20 AM

## 2021-01-20 NOTE — Care Management Important Message (Signed)
Important Message  Patient Details IM Letter given to the Patient. Name: Haley Singh MRN: [id number] Date of Birth: [birth year]   Medicare Important Message Given:  Yes     Caren Macadam 01/20/2021, 12:39 PM

## 2021-01-20 NOTE — Evaluation (Signed)
Occupational Therapy Evaluation Patient Details Name: Haley Singh MRN: [id number] DOB: 26-Dec-1950 Today's Date: 01/20/2021    History of Present Illness Pt is 70 y.o. female who presented to ED with sudden onset of severe back pain, septic arthritis suspected?Marland Kitchen PMH significant for chronic back pain, OA, HTN, HLD, asthma, depression, anxiety, insomnia, and R ankle ORIF (02/2007).   Clinical Impression   Patient is a 70 year old female who was admitted for above. Patient was previously living at home alone. Currently, patient is in 10/10 level pain with inability to tolerate movement without yelling. Evaluation was very limited with education provided to nurse and patient on importance of back precautions in bed to reduce strain on back. Nurse verbalized understanding. Patient was resistive to any touch from therapist at this time. Patient would continue to benefit from skilled OT services at this time while admitted and after d/c to address noted deficits in order to improve overall safety and independence in ADLs.      Follow Up Recommendations  SNF    Equipment Recommendations       Recommendations for Other Services       Precautions / Restrictions Precautions Precautions: Fall Precaution Comments: Back brace Restrictions Weight Bearing Restrictions: No      Mobility Bed Mobility Overal bed mobility: Needs Assistance Bed Mobility: Rolling Rolling: Total assist         General bed mobility comments: patient was educated on proper rolling technique with increased time patient was unable to complete with patient noted to turn shoulders prior to attempting to turn hips. patient was offered assistance with patient refusing for therapist assist to maintain precautions.patient required upawrds of 10 minutes to lower head of bed with periods of raisng back up when feeling "stress" on hips right more than left. nurse made aware.  patient required max A x2 for scooting up in bed with  patient attempting to reach overhead to assist despite education to keep hands around chest. patient reported being unable to pick head up off the back of the matress with patient noted to have completed task earlier in session without prompt. patients pain and self limiting behaviors for physical assistance to maintain proper body mechainics impacted session.    Transfers                      Balance                                           ADL either performed or assessed with clinical judgement   ADL Overall ADL's : Needs assistance/impaired Eating/Feeding: Set up;Bed level   Grooming: Wash/dry face;Oral care;Bed level   Upper Body Bathing: Moderate assistance;Bed level   Lower Body Bathing: Bed level;Total assistance   Upper Body Dressing : Maximal assistance;Bed level   Lower Body Dressing: Maximal assistance;Bed level   Toilet Transfer: +2 for safety/equipment;+2 for physical assistance;Total assistance Toilet Transfer Details (indicate cue type and reason): patient unable to tolerate sitting on edge of bed on this date secondary to pain. Toileting- Clothing Manipulation and Hygiene: Total assistance;Bed level       Functional mobility during ADLs: Total assistance;+2 for physical assistance;+2 for safety/equipment       Vision Patient Visual Report: No change from baseline       Perception     Praxis      Pertinent  Vitals/Pain Pain Assessment: 0-10 Pain Score: 9  Pain Location: hips and LEs R>L Pain Descriptors / Indicators: Burning;Aching;Discomfort Pain Intervention(s): Limited activity within patient's tolerance;Monitored during session;Repositioned     Hand Dominance Right   Extremity/Trunk Assessment Upper Extremity Assessment Upper Extremity Assessment: Overall WFL for tasks assessed   Lower Extremity Assessment Lower Extremity Assessment: Defer to PT evaluation   Cervical / Trunk Assessment Cervical / Trunk  Assessment: Normal   Communication Communication Communication: No difficulties   Cognition Arousal/Alertness: Awake/alert Behavior During Therapy: WFL for tasks assessed/performed Overall Cognitive Status: Within Functional Limits for tasks assessed                                 General Comments: patient was fixated on change in pain medications on 8/18. nursing aware   General Comments       Exercises     Shoulder Instructions      Home Living Family/patient expects to be discharged to:: Private residence Living Arrangements: Alone Available Help at Discharge: Friend(s);Neighbor;Available PRN/intermittently Type of Home: House Home Access: Stairs to enter [employer] of Steps: 3 Entrance Stairs-Rails: None Home Layout: Multi-level Alternate Level Stairs-Number of Steps: 14 Alternate Level Stairs-Rails: Right Bathroom Shower/Tub: Tub/shower unit;Walk-in shower   Bathroom Toilet: Handicapped height Bathroom Accessibility: Yes   Home Equipment: Emergency planning/management officer - 4 wheels;Cane - single point;Grab bars - tub/shower   Additional Comments: Pt reports I with ADLs and has been remaining upstairs in bedroom until she has to go downstairs. Reports she had fall prior to admission, denies any other falls. Has neighbors around intermittnely but elderly and unable to provide physical assist if needed.      Prior Functioning/Environment Level of Independence: Independent with assistive device(s)        Comments: interchangable with use of rollator and cane        OT Problem List: Decreased strength;Decreased activity tolerance;Impaired balance (sitting and/or standing);Decreased safety awareness;Pain      OT Treatment/Interventions: Self-care/ADL training;Neuromuscular education;Energy conservation;DME and/or AE instruction;Therapeutic activities;Balance training;Patient/family education    OT Goals(Current goals can be found in the care plan  section) Acute Rehab OT Goals Patient Stated Goal: have less pain OT Goal Formulation: With patient Time For Goal Achievement: 02/03/21 Potential to Achieve Goals: Good  OT Frequency: Min 2X/week   Barriers to D/C:    patient is unable to tolerate movement at this time due to pain. patient lives at home alone       Co-evaluation              AM-PAC OT "6 Clicks" Daily Activity     Outcome Measure Help from another person eating meals?: A Little Help from another person taking care of personal grooming?: Total Help from another person toileting, which includes using toliet, bedpan, or urinal?: Total Help from another person bathing (including washing, rinsing, drying)?: Total Help from another person to put on and taking off regular upper body clothing?: Total Help from another person to put on and taking off regular lower body clothing?: Total 6 Click Score: 8   End of Session Nurse Communication: Mobility status  Activity Tolerance: Patient limited by pain Patient left: in bed;with call bell/phone within reach  OT Visit Diagnosis: Unsteadiness on feet (R26.81);Muscle weakness (generalized) (M62.81);Pain Pain - part of body:  (back)                Time: 6073-7106 OT Time Calculation (  min): 32 min Charges:  OT General Charges $OT Visit: 1 Visit OT Evaluation $OT Eval Moderate Complexity: 1 Mod OT Treatments $Self Care/Home Management : 8-22 mins  Sharyn Blitz OTR/L, MS Acute Rehabilitation Department Office# [phone] Pager# [phone]   Chalmers Guest [place] Nick 01/20/2021, 3:07 PM

## 2021-01-20 NOTE — Plan of Care (Signed)
°  Problem: Coping: °Goal: Level of anxiety will decrease °Outcome: Progressing °  °

## 2021-01-20 NOTE — Progress Notes (Signed)
Facet aspiration growing MSSA and she is on culture-directed abx per ID. We will sign off for now, but please call with any questions. She may develop instability at L4-5 from facet destruction, and may need follow up with [place] in the future

## 2021-01-20 NOTE — Progress Notes (Signed)
[facility]  Date of Admission:  01/17/2021     CC: Mssa bacteremia Lumbar spine osteomyelitis  Lines:  Peripheral iv's   Abx: 8/18-c cefazolin   8/16-18 vanc                                                        Assessment: 70 yo female lumbago, OA, htn/hlp, asthma, admitted 8/16 with progressive 7 day acute on chronic lower back pain, found to have mri imaging L4-5 vertebral om/discitis and mssa bacteremia   8/17 bcx mssa by bcid 8/17 IR aspirate (left L4-5 facet joint) cx in progress 8/19 bcx in process   Source usually unclear on these patients. But so far no obvious metastatic site of infection. Presumably this is hematogenous spread of mssa bacteremia.   8/19 assessment 8/18 tte no obvious valve vegetation Repeat bcx in process Afebrile; hds No other obvious source of metastatic involvement  If repeat bcx continues to be positive despite appropriate abx, would get TEE (if tte negative) and pan body ct     Plan: Continue cefazolin 2 gram iv q8hours F/u repeat bcx Patient asking for more pain medication -- will defer to primary team Discussed with primary team   I spent more than 35 minute reviewing data/chart, and coordinating care and >50% direct face to face time providing counseling/discussing diagnostics/treatment plan with patient   Active Problems:   Osteomyelitis (HCC)   Discitis of lumbar region   Allergies  Allergen Reactions   Oxycontin [Oxycodone] Itching   Penicillins Other (See Comments)    unk Did it involve swelling of the face/tongue/throat, SOB, or low BP? Unknown Did it involve sudden or severe rash/hives, skin peeling, or any reaction on the inside of your mouth or nose? Unknown Did you need to seek medical attention at a hospital or doctor's office? Unknown When did it last happen?     over 10 years ago  If all above answers are "NO", may proceed with cephalosporin use.    Tetracyclines &  Related Itching    Scheduled Meds:  (feeding supplement) PROSource Plus  30 mL Oral BID BM   amLODipine  5 mg Oral Daily   atorvastatin  40 mg Oral Daily   enoxaparin (LOVENOX) injection  40 mg Subcutaneous Q24H   fluticasone  1 spray Each Nare BID   fluticasone furoate-vilanterol  1 puff Inhalation Daily   gabapentin  600 mg Oral QHS   HYDROmorphone   Intravenous Q4H   lidocaine  1 patch Transdermal Q24H   loratadine  10 mg Oral Daily   losartan  50 mg Oral Daily   methocarbamol  500 mg Oral TID   montelukast  10 mg Oral QHS   polyethylene glycol  17 g Oral Daily   Ensure Max Protein  11 oz Oral Daily   traZODone  150 mg Oral QHS   venlafaxine XR  150 mg Oral q morning   Continuous Infusions:   ceFAZolin (ANCEF) IV 2 g (01/20/21 1349)   PRN Meds:.acetaminophen **OR** acetaminophen, albuterol, bisacodyl, celecoxib, ondansetron **OR** ondansetron (ZOFRAN) IV, senna-docusate, SUMAtriptan   SUBJECTIVE: Lots of LLE leg spasm, not much back pain No numbness tingling LE No urinary/bowel incontinence Tte no vegetation Repeat bcx in progress Wbc count stable Afebrile  No other joint pain  Review of Systems: ROS All other ROS was negative, except mentioned above     OBJECTIVE: Vitals:   01/20/21 0516 01/20/21 0730 01/20/21 1123 01/20/21 1320  BP: (!) 122/59  121/61 132/65  Pulse: 81  79 97  Resp: 20  14 17   Temp: 97.8 F (36.6 C)   98 F (36.7 C)  TempSrc: Oral   Oral  SpO2: 100% 97% 99% 100%  Weight:      Height:       Body mass index is 32.28 kg/m.  Physical Exam General/constitutional: no distress, pleasant, appears mildly sedated from narcotics pca HEENT: Normocephalic, PER, Conj Clear, EOMI, Oropharynx clear Neck supple CV: rrr no mrg Lungs: clear to auscultation, normal respiratory effort Abd: Soft, Nontender Ext: no edema Skin: No Rash Neuro: nonfocal MSK: no peripheral joint swelling/tenderness/warmth; lumbar spine tenderness  moderate    Central line presence: no   Lab Results Lab Results  Component Value Date   WBC 14.0 (H) 01/20/2021   HGB 9.0 (L) 01/20/2021   HCT 28.0 (L) 01/20/2021   MCV 90.9 01/20/2021   PLT 269 01/20/2021    Lab Results  Component Value Date   CREATININE 0.77 01/20/2021   BUN 16 01/20/2021   NA 134 (L) 01/20/2021   K 3.5 01/20/2021   CL 99 01/20/2021   CO2 28 01/20/2021    Lab Results  Component Value Date   ALT 13 01/20/2021   AST 14 (L) 01/20/2021   ALKPHOS 87 01/20/2021   BILITOT 0.7 01/20/2021      Microbiology: Recent Results (from the past 240 hour(s))  SARS CORONAVIRUS 2 (TAT 6-24 HRS) Nasopharyngeal Nasopharyngeal Swab     Status: None   Collection Time: 01/17/21  5:42 PM   Specimen: Nasopharyngeal Swab  Result Value Ref Range Status   SARS Coronavirus 2 NEGATIVE NEGATIVE Final    Comment: (NOTE) SARS-CoV-2 target nucleic acids are NOT DETECTED.  The SARS-CoV-2 RNA is generally detectable in upper and lower respiratory specimens during the acute phase of infection. Negative results do not preclude SARS-CoV-2 infection, do not rule out co-infections with other pathogens, and should not be used as the sole basis for treatment or other patient management decisions. Negative results must be combined with clinical observations, patient history, and epidemiological information. The expected result is Negative.  Fact Sheet for Patients: HairSlick.nohttps://www.fda.gov/media/138098/download  Fact Sheet for Healthcare Providers: quierodirigir.comhttps://www.fda.gov/media/138095/download  This test is not yet approved or cleared by the [place] FDA and  has been authorized for detection and/or diagnosis of SARS-CoV-2 by FDA under an Emergency Use Authorization (EUA). This EUA will remain  in effect (meaning this test can be used) for the duration of the COVID-19 declaration under Se ction 564(b)(1) of the Act, 21 U.S.C. section 360bbb-3(b)(1), unless the authorization is  terminated or revoked sooner.  Performed at [facility], [address] [place], [place] [address]   Aerobic/Anaerobic Culture w Gram Stain (surgical/deep wound)     Status: None (Preliminary result)   Collection Time: 01/18/21  2:52 PM   Specimen: Abscess  Result Value Ref Range Status   Specimen Description   Final    ABSCESS VERTEBRA Performed at [facility], [address] [place], [place] [address]    Special Requests   Final    NONE Performed at [facility], [address] [place], [place] [address]    Gram Stain   Final    RARE WBC  PRESENT, PREDOMINANTLY MONONUCLEAR NO ORGANISMS SEEN    Culture   Final    NO GROWTH 2 DAYS NO ANAEROBES ISOLATED; CULTURE IN PROGRESS FOR 5 DAYS Performed at [facility], [address] [place], [place] [address]    Report Status PENDING  Incomplete  Culture, blood (Routine X 2) w Reflex to ID Panel     Status: Abnormal (Preliminary result)   Collection Time: 01/18/21  5:08 PM   Specimen: BLOOD  Result Value Ref Range Status   Specimen Description   Final    BLOOD LEFT ANTECUBITAL Performed at [facility], [address], [place], [place] [address]    Special Requests   Final    BOTTLES DRAWN AEROBIC AND ANAEROBIC Blood Culture adequate volume Performed at [facility], [address], [place], [place] [address]    Culture  Setup Time   Final    GRAM POSITIVE COCCI IN CLUSTERS IN BOTH AEROBIC AND ANAEROBIC BOTTLES CRITICAL VALUE NOTED.  VALUE IS CONSISTENT WITH PREVIOUSLY REPORTED AND CALLED VALUE. Performed at [facility], [address], [place], [place] [address]    Culture STAPHYLOCOCCUS AUREUS (A)  Final   Report Status PENDING  Incomplete  Culture, blood (Routine X 2) w Reflex to ID Panel     Status: Abnormal (Preliminary result)   Collection Time: 01/18/21  5:08 PM   Specimen: BLOOD RIGHT HAND  Result Value Ref Range Status   Specimen  Description   Final    BLOOD RIGHT HAND Performed at [facility], [address], [place], [place] [address]    Special Requests   Final    BOTTLES DRAWN AEROBIC AND ANAEROBIC Blood Culture adequate volume Performed at [facility], [address] [place], [place] [address]    Culture  Setup Time   Final    GRAM POSITIVE COCCI IN CLUSTERS IN BOTH AEROBIC AND ANAEROBIC BOTTLES CRITICAL RESULT CALLED TO, READ BACK BY AND VERIFIED WITH: PHARMD MARY SWAYNE 01/19/2021 @1204  BY JW    Culture (A)  Final    STAPHYLOCOCCUS AUREUS SUSCEPTIBILITIES TO FOLLOW Performed at [facility], [address] [place], [place]    Report Status PENDING  Incomplete  Blood Culture ID Panel (Reflexed)     Status: Abnormal   Collection Time: 01/18/21  5:08 PM  Result Value Ref Range Status   Enterococcus faecalis NOT DETECTED NOT DETECTED Final   Enterococcus Faecium NOT DETECTED NOT DETECTED Final   Listeria monocytogenes NOT DETECTED NOT DETECTED Final   Staphylococcus species DETECTED (A) NOT DETECTED Final    Comment: CRITICAL RESULT CALLED TO, READ BACK BY AND VERIFIED WITH: PHARMD MARY SWAYNE 01/19/2021 @1204  BY JW    Staphylococcus aureus (BCID) DETECTED (A) NOT DETECTED Final    Comment: CRITICAL RESULT CALLED TO, READ BACK BY AND VERIFIED WITH: PHARMD MARY SWAYNE 01/19/2021 @1204  BY JW    Staphylococcus epidermidis NOT DETECTED NOT DETECTED Final   Staphylococcus lugdunensis NOT DETECTED NOT DETECTED Final   Streptococcus species NOT DETECTED NOT DETECTED Final   Streptococcus agalactiae NOT DETECTED NOT DETECTED Final   Streptococcus pneumoniae NOT DETECTED NOT DETECTED Final   Streptococcus pyogenes NOT DETECTED NOT DETECTED Final   A.calcoaceticus-baumannii NOT DETECTED NOT DETECTED Final   Bacteroides fragilis NOT DETECTED NOT DETECTED Final   Enterobacterales NOT DETECTED NOT DETECTED Final   Enterobacter cloacae complex NOT DETECTED  NOT DETECTED Final   Escherichia coli NOT DETECTED  NOT DETECTED Final   Klebsiella aerogenes NOT DETECTED NOT DETECTED Final   Klebsiella oxytoca NOT DETECTED NOT DETECTED Final   Klebsiella pneumoniae NOT DETECTED NOT DETECTED Final   Proteus species NOT DETECTED NOT DETECTED Final   Salmonella species NOT DETECTED NOT DETECTED Final   Serratia marcescens NOT DETECTED NOT DETECTED Final   Haemophilus influenzae NOT DETECTED NOT DETECTED Final   Neisseria meningitidis NOT DETECTED NOT DETECTED Final   Pseudomonas aeruginosa NOT DETECTED NOT DETECTED Final   Stenotrophomonas maltophilia NOT DETECTED NOT DETECTED Final   Candida albicans NOT DETECTED NOT DETECTED Final   Candida auris NOT DETECTED NOT DETECTED Final   Candida glabrata NOT DETECTED NOT DETECTED Final   Candida krusei NOT DETECTED NOT DETECTED Final   Candida parapsilosis NOT DETECTED NOT DETECTED Final   Candida tropicalis NOT DETECTED NOT DETECTED Final   Cryptococcus neoformans/gattii NOT DETECTED NOT DETECTED Final   Meth resistant mecA/C and MREJ NOT DETECTED NOT DETECTED Final    Comment: Performed at [facility], [address], [place], [place] [address]  Culture, blood (routine x 2)     Status: None (Preliminary result)   Collection Time: 01/20/21  4:26 AM   Specimen: BLOOD  Result Value Ref Range Status   Specimen Description   Final    BLOOD LEFT ANTECUBITAL Performed at [facility], [address], [place], [place] [address]    Special Requests   Final    BOTTLES DRAWN AEROBIC AND ANAEROBIC Blood Culture adequate volume Performed at [facility], [address], [place], [place] [address]    Culture   Final    NO GROWTH <12 HOURS Performed at [facility], [address], [place], [place] [address]    Report Status PENDING  Incomplete  Culture, blood (routine x 2)     Status: None (Preliminary result)   Collection Time: 01/20/21  4:32 AM   Specimen:  BLOOD RIGHT HAND  Result Value Ref Range Status   Specimen Description   Final    BLOOD RIGHT HAND Performed at [facility], [address], [place], [place] [address]    Special Requests   Final    BOTTLES DRAWN AEROBIC ONLY Blood Culture adequate volume Performed at [facility], [address] [place], [place] [address]    Culture   Final    NO GROWTH <12 HOURS Performed at [facility], [address] [place], [place] [address]    Report Status PENDING  Incomplete     Serology:   Imaging: If present, new imagings (plain films, ct scans, and mri) have been personally visualized and interpreted; radiology reports have been reviewed. Decision making incorporated into the Impression / Recommendations.  8/16 mri lumbar spine 1. Increased T2 signal about and disruption of the left L4-L5 facets, with increased T2 signal in posterior L5 vertebral body, left pedicle, and surrounding soft tissues. Edema and osseous destruction cause mild spinal canal narrowing. This could represent acute fracture or infection. Correlate with history of trauma and consider CT for further osseous evaluation. 2. L2-L3 mild canal, moderate left neural foraminal narrowing and mild-to-moderate right neural foraminal narrowing.  8/18 tte  1. Left ventricular ejection fraction, by estimation, is 60 to 65%. The  left ventricle has normal function. The left ventricle has no regional  wall motion abnormalities. There is moderate asymmetric left ventricular  hypertrophy of the basal-septal  segment. Left ventricular diastolic parameters  were normal.   2. Right ventricular systolic function is normal. The right ventricular  size is normal. There is normal pulmonary artery systolic pressure. The  estimated right ventricular systolic pressure is 27.9 mmHg.   3. The mitral valve is normal in structure. Trivial mitral valve  regurgitation. No evidence of mitral  stenosis.   4. The aortic valve was not well visualized. Aortic valve regurgitation  is not visualized. No aortic stenosis is present.   5. The inferior vena cava is normal in size with <50% respiratory  variability, suggesting right atrial pressure of 8 mmHg.   Conclusion(s)/Recommendation(s): No vegetation seen. If high clinical  suspicion for endocarditis, recommend TEE.   Raymondo Band, MD [facility] [phone] pager    01/20/2021, 3:28 PM

## 2021-01-21 DIAGNOSIS — M545 Low back pain, unspecified: Secondary | ICD-10-CM | POA: Diagnosis not present

## 2021-01-21 LAB — CBC WITH DIFFERENTIAL/PLATELET
Abs Immature Granulocytes: 0.16 10*3/uL — ABNORMAL HIGH (ref 0.00–0.07)
Basophils Absolute: 0 10*3/uL (ref 0.0–0.1)
Basophils Relative: 0 %
Eosinophils Absolute: 0 10*3/uL (ref 0.0–0.5)
Eosinophils Relative: 0 %
HCT: 31.1 % — ABNORMAL LOW (ref 36.0–46.0)
Hemoglobin: 9.9 g/dL — ABNORMAL LOW (ref 12.0–15.0)
Immature Granulocytes: 1 %
Lymphocytes Relative: 5 %
Lymphs Abs: 0.5 10*3/uL — ABNORMAL LOW (ref 0.7–4.0)
MCH: 28.6 pg (ref 26.0–34.0)
MCHC: 31.8 g/dL (ref 30.0–36.0)
MCV: 89.9 fL (ref 80.0–100.0)
Monocytes Absolute: 0.9 10*3/uL (ref 0.1–1.0)
Monocytes Relative: 8 %
Neutro Abs: 9.9 10*3/uL — ABNORMAL HIGH (ref 1.7–7.7)
Neutrophils Relative %: 86 %
Platelets: 287 10*3/uL (ref 150–400)
RBC: 3.46 MIL/uL — ABNORMAL LOW (ref 3.87–5.11)
RDW: 14.2 % (ref 11.5–15.5)
WBC: 11.5 10*3/uL — ABNORMAL HIGH (ref 4.0–10.5)
nRBC: 0 % (ref 0.0–0.2)

## 2021-01-21 LAB — CULTURE, BLOOD (ROUTINE X 2)
Special Requests: ADEQUATE
Special Requests: ADEQUATE

## 2021-01-21 LAB — COMPREHENSIVE METABOLIC PANEL
ALT: 14 U/L (ref 0–44)
AST: 15 U/L (ref 15–41)
Albumin: 2.4 g/dL — ABNORMAL LOW (ref 3.5–5.0)
Alkaline Phosphatase: 98 U/L (ref 38–126)
Anion gap: 9 (ref 5–15)
BUN: 11 mg/dL (ref 8–23)
CO2: 27 mmol/L (ref 22–32)
Calcium: 8.2 mg/dL — ABNORMAL LOW (ref 8.9–10.3)
Chloride: 99 mmol/L (ref 98–111)
Creatinine, Ser: 0.61 mg/dL (ref 0.44–1.00)
GFR, Estimated: >60 mL/min (ref >60)
Glucose, Bld: 123 mg/dL — ABNORMAL HIGH (ref 70–99)
Potassium: 3.4 mmol/L — ABNORMAL LOW (ref 3.5–5.1)
Sodium: 135 mmol/L (ref 135–145)
Total Bilirubin: 0.7 mg/dL (ref 0.3–1.2)
Total Protein: 7.5 g/dL (ref 6.5–8.1)

## 2021-01-21 MED ORDER — SODIUM CHLORIDE 0.9 % IV SOLN
INTRAVENOUS | Status: DC | PRN
  Administered 2021-01-21: 250 mL via INTRAVENOUS

## 2021-01-21 MED ORDER — OXYCODONE HCL ER 15 MG PO T12A
15.0000 mg | EXTENDED_RELEASE_TABLET | Freq: Two times a day (BID) | ORAL | Status: DC
  Administered 2021-01-21 – 2021-01-27 (×14): 15 mg via ORAL
  Filled 2021-01-21 (×14): qty 1

## 2021-01-21 MED ORDER — SENNOSIDES-DOCUSATE SODIUM 8.6-50 MG PO TABS
1.0000 | ORAL_TABLET | Freq: Two times a day (BID) | ORAL | Status: DC
  Administered 2021-01-21 – 2021-01-27 (×9): 1 via ORAL
  Filled 2021-01-21 (×13): qty 1

## 2021-01-21 MED ORDER — OXYCODONE-ACETAMINOPHEN 5-325 MG PO TABS
1.0000 | ORAL_TABLET | ORAL | Status: DC | PRN
  Administered 2021-01-21 – 2021-01-23 (×6): 1 via ORAL
  Filled 2021-01-21 (×7): qty 1

## 2021-01-21 MED ORDER — METHOCARBAMOL 1000 MG/10ML IJ SOLN
500.0000 mg | Freq: Four times a day (QID) | INTRAVENOUS | Status: DC | PRN
  Administered 2021-01-22: 500 mg via INTRAVENOUS
  Filled 2021-01-21: qty 500
  Filled 2021-01-21 (×2): qty 5

## 2021-01-21 NOTE — Progress Notes (Signed)
PROGRESS NOTE   Haley Singh  YOX:[id number] DOB: May 27, 1951 DOA: 01/17/2021 PCP: Katherina Mires, MD   Brief Narrative:   70 year old white female Asthma HTN depression hyperlipidemia previous bilateral renal stones with double-J stent status post lithotripsy 2008--subsequent multiple procedures 08/26/2019 and 10/12/2019 Small bowel obstruction 2005 requiring laparotomy for small bowel resection and primary anastomosis  Admitted 8/16 with severe back pain Work-up showed  L4-L5, L5-S1 T2 signal in L5 vertebral body with mild canal narrowing?  Infection WBC 12 ESR over 100 CRP 19 no phlegmon on CT Neurosurgery consulted recommended conservative management  Grew MSSA and ID consulted  Pain med's tailored form PCA Except she will be here through the weekend until pain more tolerable and we can finalize Abx  Hospital-Problem based course   MSSA Sepsis on admission secondary to possible facet joint infection? PCA changed to OxyContin 15 bid and percocet 5/325 q4 prn Robaxin 500 x 3 times daily--give IV as more spasm Gabapentin 900 --monitor mentation continue Celebrex 200-400 for moderate pain Vancomycin changed to Ancef per ID TT ECHO no vegetation-ID input appreciated Prior bilateral renal stones status post lithotripsy 2008 2021 Monitor trends Migraines Continue Imitrex 50 daily as needed HTN Uncontrolled Continue amlodipine 5, losartan 50 Depression Continue trazodone 150 at bedtime, Effexor 150 qd Neuropathy NOS See above   DVT prophylaxis: Lovenox Code Status: Full Family Communication: None Disposition:  Status is: Inpatient  Remains inpatient appropriate because:Hemodynamically unstable, Ongoing active pain requiring inpatient pain management, and Altered mental status  Dispo: The patient is from: Home              Anticipated d/c is to: SNF              Patient currently is not medically stable to d/c.   Difficult to place patient No   Consultants:   Neurosurgery consulted IR consulted  Procedures: None yet  Antimicrobials:     Subjective:  Awakens but sleepy most of am per RN States pain still severe, more spasm--Lidocaine patches resumed early We discussed switching to oral meds from PCA   Objective: Vitals:   01/21/21 0558 01/21/21 0823 01/21/21 1313 01/21/21 1319  BP: (!) 176/84  (!) 162/75   Pulse: (!) 101  100   Resp: _0 Temp: 97.8 F (36.6 C)  98.4 F (36.9 C)   TempSrc: Oral  Oral   SpO2: 98% 96% 99% 97%  Weight:      Height:        Intake/Output Summary (Last 24 hours) at 01/21/2021 1355 Last data filed at 01/21/2021 1312 Gross per 24 hour  Intake 451.1 ml  Output 1050 ml  Net -598.9 ml    Filed Weights   01/17/21 1028  Weight: 90.7 kg    Examination:  Somnolent some pain CTA B no added sound no rales no rhonchi Abdomen soft  weak  SLR neg but limited by effort, weak  Data Reviewed: personally reviewed   CBC    Component Value Date/Time   WBC 11.5 (H) 01/21/2021 0445   RBC 3.46 (L) 01/21/2021 0445   HGB 9.9 (L) 01/21/2021 0445   HCT 31.1 (L) 01/21/2021 0445   PLT 287 01/21/2021 0445   MCV 89.9 01/21/2021 0445   MCH 28.6 01/21/2021 0445   MCHC 31.8 01/21/2021 0445   RDW 14.2 01/21/2021 0445   LYMPHSABS 0.5 (L) 01/21/2021 0445   MONOABS 0.9 01/21/2021 0445   EOSABS 0.0 01/21/2021 0445   BASOSABS 0.0 01/21/2021  0445   CMP Latest Ref Rng & Units 01/21/2021 01/20/2021 01/19/2021  Glucose 70 - 99 mg/dL 123(H) 115(H) 151(H)  BUN 8 - 23 mg/dL _0 Creatinine 0.44 - 1.00 mg/dL 0.61 0.77 0.75  Sodium 135 - 145 mmol/L 135 134(L) 134(L)  Potassium 3.5 - 5.1 mmol/L 3.4(L) 3.5 4.0  Chloride 98 - 111 mmol/L 99 99 99  CO2 22 - 32 mmol/L _1 Calcium 8.9 - 10.3 mg/dL 8.2(L) 8.1(L) 8.3(L)  Total Protein 6.5 - 8.1 g/dL 7.5 7.2 7.4  Total Bilirubin 0.3 - 1.2 mg/dL 0.7 0.7 1.2  Alkaline Phos 38 - 126 U/L 98 87 96  AST 15 - 41 U/L 15 14(L) 11(L)  ALT 0 - 44 U/L _2 Radiology Studies: ECHOCARDIOGRAM COMPLETE  Result Date: 01/19/2021    ECHOCARDIOGRAM REPORT   Patient Name:   Haley Singh Date of Exam: 01/19/2021 Medical Rec #:  [id number]       Height:       66.0 in Accession #:    [id number]      Weight:       200.0 lb Date of Birth:  04/11/1951        BSA:          2.000 m Patient Age:    59 years        BP:           136/64 mmHg Patient Gender: F               HR:           87 bpm. Exam Location:  Inpatient Procedure: 2D Echo, Color Doppler and Cardiac Doppler Indications:    Bacteremia  History:        Patient has prior history of Echocardiogram examinations, most                 recent 08/06/2016. Risk Factors:Dyslipidemia and Hypertension.  Sonographer:    Bernadene Person RDCS Referring Phys: [id number] [place]  1. Left ventricular ejection fraction, by estimation, is 60 to 65%. The left ventricle has normal function. The left ventricle has no regional wall motion abnormalities. There is moderate asymmetric left ventricular hypertrophy of the basal-septal segment. Left ventricular diastolic parameters were normal.  2. Right ventricular systolic function is normal. The right ventricular size is normal. There is normal pulmonary artery systolic pressure. The estimated right ventricular systolic pressure is 38.1 mmHg.  3. The mitral valve is normal in structure. Trivial mitral valve regurgitation. No evidence of mitral stenosis.  4. The aortic valve was not well visualized. Aortic valve regurgitation is not visualized. No aortic stenosis is present.  5. The inferior vena cava is normal in size with <50% respiratory variability, suggesting right atrial pressure of 8 mmHg. Conclusion(s)/Recommendation(s): No vegetation seen. If high clinical suspicion for endocarditis, recommend TEE. FINDINGS  Left Ventricle: Left ventricular ejection fraction, by estimation, is 60 to 65%. The left ventricle has normal function. The left ventricle has no regional wall  motion abnormalities. The left ventricular internal cavity size was normal in size. There is  moderate asymmetric left ventricular hypertrophy of the basal-septal segment. Left ventricular diastolic parameters were normal. Right Ventricle: The right ventricular size is normal. No increase in right ventricular wall thickness. Right ventricular systolic function is normal. There is normal pulmonary artery systolic pressure. The tricuspid regurgitant velocity is 2.23 m/s, and  with an assumed right atrial  pressure of 8 mmHg, the estimated right ventricular systolic pressure is 56.3 mmHg. Left Atrium: Left atrial size was normal in size. Right Atrium: Right atrial size was normal in size. Pericardium: There is no evidence of pericardial effusion. Mitral Valve: The mitral valve is normal in structure. Trivial mitral valve regurgitation. No evidence of mitral valve stenosis. Tricuspid Valve: The tricuspid valve is normal in structure. Tricuspid valve regurgitation is trivial. Aortic Valve: The aortic valve was not well visualized. Aortic valve regurgitation is not visualized. No aortic stenosis is present. Pulmonic Valve: The pulmonic valve was not well visualized. Pulmonic valve regurgitation is trivial. Aorta: The aortic root and ascending aorta are structurally normal, with no evidence of dilitation. Venous: The inferior vena cava is normal in size with less than 50% respiratory variability, suggesting right atrial pressure of 8 mmHg. IAS/Shunts: The interatrial septum was not well visualized.  LEFT VENTRICLE PLAX 2D LVIDd:         4.10 cm  Diastology LVIDs:         2.70 cm  LV e' medial:    7.75 cm/s LV PW:         1.20 cm  LV E/e' medial:  10.7 LV IVS:        1.20 cm  LV e' lateral:   9.38 cm/s LVOT diam:     2.00 cm  LV E/e' lateral: 8.9 LV SV:         59 LV SV Index:   30 LVOT Area:     3.14 cm  RIGHT VENTRICLE RV S prime:     15.40 cm/s TAPSE (M-mode): 2.6 cm LEFT ATRIUM             Index       RIGHT ATRIUM            Index LA diam:        3.30 cm 1.65 cm/m  RA Area:     13.90 cm LA Vol (A2C):   24.2 ml 12.10 ml/m RA Volume:   33.20 ml  16.60 ml/m LA Vol (A4C):   22.5 ml 11.25 ml/m LA Biplane Vol: 23.9 ml 11.95 ml/m  AORTIC VALVE             PULMONIC VALVE LVOT Vmax:   103.00 cm/s PR End Diast Vel: 5.95 msec LVOT Vmean:  66.400 cm/s LVOT VTI:    0.189 m  AORTA Ao Root diam: 3.80 cm Ao Asc diam:  3.50 cm MITRAL VALVE               TRICUSPID VALVE MV Area (PHT): 2.73 cm    TR Peak grad:   19.9 mmHg MV Decel Time: 278 msec    TR Vmax:        223.00 cm/s MV E velocity: 83.10 cm/s MV A velocity: 88.30 cm/s  SHUNTS MV E/A ratio:  0.94        Systemic VTI:  0.19 m                            Systemic Diam: 2.00 cm Oswaldo Milian MD Electronically signed by Oswaldo Milian MD Signature Date/Time: 01/19/2021/5:28:16 PM    Final      Scheduled Meds:  (feeding supplement) PROSource Plus  30 mL Oral BID BM   amLODipine  5 mg Oral Daily   atorvastatin  40 mg Oral Daily   enoxaparin (LOVENOX) injection  40 mg Subcutaneous Q24H  fluticasone  1 spray Each Nare BID   fluticasone furoate-vilanterol  1 puff Inhalation Daily   gabapentin  600 mg Oral QHS   HYDROmorphone   Intravenous Q4H   lidocaine  1 patch Transdermal Q24H   loratadine  10 mg Oral Daily   losartan  50 mg Oral Daily   montelukast  10 mg Oral QHS   oxyCODONE  15 mg Oral Q12H   polyethylene glycol  17 g Oral Daily   Ensure Max Protein  11 oz Oral Daily   traZODone  150 mg Oral QHS   venlafaxine XR  150 mg Oral q morning   Continuous Infusions:   ceFAZolin (ANCEF) IV 2 g (01/21/21 7919)   methocarbamol (ROBAXIN) IV       LOS: 4 days   Time spent: 20  Nita Sells, MD [facility] To contact the attending provider between 7A-7P or the covering provider during after hours 7P-7A, please log into the web site [email or web address] and access using universal [place] password for that web site. If you do not have the password,  please call the hospital operator.  01/21/2021, 1:55 PM

## 2021-01-22 DIAGNOSIS — M545 Low back pain, unspecified: Secondary | ICD-10-CM | POA: Diagnosis not present

## 2021-01-22 DIAGNOSIS — M4646 Discitis, unspecified, lumbar region: Secondary | ICD-10-CM | POA: Diagnosis not present

## 2021-01-22 NOTE — Progress Notes (Signed)
PROGRESS NOTE   Haley Singh  QHU:[id number] DOB: 1950/08/10 DOA: 01/17/2021 PCP: Katherina Mires, MD   Brief Narrative:   70 year old white female Asthma HTN depression hyperlipidemia previous bilateral renal stones with double-J stent status post lithotripsy 2008--subsequent multiple procedures 08/26/2019 and 10/12/2019 Small bowel obstruction 2005 requiring laparotomy for small bowel resection and primary anastomosis  Admitted 8/16 with severe back pain Work-up showed  L4-L5, L5-S1 T2 signal in L5 vertebral body with mild canal narrowing?  Infection WBC 12 ESR over 100 CRP 19 no phlegmon on CT Neurosurgery consulted recommended conservative management  Grew MSSA and ID consulted  Pain med's tailored form PCA Except she will be here through the weekend until pain more tolerable and we can finalize Abx  Hospital-Problem based course   MSSA Sepsis on admission secondary to possible facet joint infection? PCA d/c-this a.m.-received PCA overnight despite intent to transition to orals yesterday-we will trial only p.o. OxyContin 15 bid and percocet 5/325 q4 prn--escalate meds as needed Robaxin 500 x 3 times daily--still present and not really responsive to IV Gabapentin 900 --monitor mentation continue Celebrex 200-400 for moderate pain Vancomycin changed to Ancef per ID TT ECHO no vegetation-ID input appreciated Prior bilateral renal stones status post lithotripsy 2008 2021 Monitor trends Migraines Continue Imitrex 50 daily as needed HTN Uncontrolled Continue amlodipine 5, losartan 50 Depression Continue trazodone 150 at bedtime, Effexor 150 qd Neuropathy NOS See above   DVT prophylaxis: Lovenox Code Status: Full Family Communication: None Disposition:  Status is: Inpatient  Remains inpatient appropriate because:Hemodynamically unstable, Ongoing active pain requiring inpatient pain management, and Altered mental status  Dispo: The patient is from: Home               Anticipated d/c is to: SNF              Patient currently is not medically stable to d/c.   Difficult to place patient No   Consultants:  Neurosurgery consulted IR consulted  Procedures: None yet  Antimicrobials:     Subjective:  Awake alert PCA continued overnight due to miscommunication between myself and nursing staff however patient coherent alert and seems improved in terms of pain although spasm is still present   [discussed this with charge RN to ensure clear MD/RN communication, had verbalized to nursing to discontinue the PCA about an hour after starting oral meds--I do accept responsibility for not discontinuing the order]  She wants to get up out of bed she has not passed a stool yet She has no fever no chills her white count continues to decline  Objective: Vitals:   01/22/21 0046 01/22/21 0416 01/22/21 0533 01/22/21 0826  BP:   131/65   Pulse:   79   Resp: _0 Temp:   (!) 97.4 F (36.3 C)   TempSrc:   Oral   SpO2: 97% 98% 97% 96%  Weight:      Height:        Intake/Output Summary (Last 24 hours) at 01/22/2021 0940 Last data filed at 01/22/2021 0600 Gross per 24 hour  Intake 572.09 ml  Output 1850 ml  Net -1277.91 ml    Filed Weights   01/17/21 1028  Weight: 90.7 kg    Examination:  Less somnolent no pain CTA B no added sound no rales no rhonchi Abdomen soft  weak straight leg raise is negative she is able to move her legs better she wants to get up out of bed SLR neg  but limited by effort, weak  Data Reviewed: personally reviewed   CBC    Component Value Date/Time   WBC 11.5 (H) 01/21/2021 0445   RBC 3.46 (L) 01/21/2021 0445   HGB 9.9 (L) 01/21/2021 0445   HCT 31.1 (L) 01/21/2021 0445   PLT 287 01/21/2021 0445   MCV 89.9 01/21/2021 0445   MCH 28.6 01/21/2021 0445   MCHC 31.8 01/21/2021 0445   RDW 14.2 01/21/2021 0445   LYMPHSABS 0.5 (L) 01/21/2021 0445   MONOABS 0.9 01/21/2021 0445   EOSABS 0.0 01/21/2021 0445   BASOSABS 0.0  01/21/2021 0445   CMP Latest Ref Rng & Units 01/21/2021 01/20/2021 01/19/2021  Glucose 70 - 99 mg/dL 123(H) 115(H) 151(H)  BUN 8 - 23 mg/dL _0 Creatinine 0.44 - 1.00 mg/dL 0.61 0.77 0.75  Sodium 135 - 145 mmol/L 135 134(L) 134(L)  Potassium 3.5 - 5.1 mmol/L 3.4(L) 3.5 4.0  Chloride 98 - 111 mmol/L 99 99 99  CO2 22 - 32 mmol/L _1 Calcium 8.9 - 10.3 mg/dL 8.2(L) 8.1(L) 8.3(L)  Total Protein 6.5 - 8.1 g/dL 7.5 7.2 7.4  Total Bilirubin 0.3 - 1.2 mg/dL 0.7 0.7 1.2  Alkaline Phos 38 - 126 U/L 98 87 96  AST 15 - 41 U/L 15 14(L) 11(L)  ALT 0 - 44 U/L _2 Radiology Studies: No results found.   Scheduled Meds:  (feeding supplement) PROSource Plus  30 mL Oral BID BM   amLODipine  5 mg Oral Daily   atorvastatin  40 mg Oral Daily   enoxaparin (LOVENOX) injection  40 mg Subcutaneous Q24H   fluticasone  1 spray Each Nare BID   fluticasone furoate-vilanterol  1 puff Inhalation Daily   gabapentin  600 mg Oral QHS   lidocaine  1 patch Transdermal Q24H   loratadine  10 mg Oral Daily   losartan  50 mg Oral Daily   montelukast  10 mg Oral QHS   oxyCODONE  15 mg Oral Q12H   Ensure Max Protein  11 oz Oral Daily   Haley-docusate  1 tablet Oral BID   traZODone  150 mg Oral QHS   venlafaxine XR  150 mg Oral q morning   Continuous Infusions:  sodium chloride 250 mL (01/21/21 1814)    ceFAZolin (ANCEF) IV 2 g (01/22/21 0548)   methocarbamol (ROBAXIN) IV 500 mg (01/22/21 0249)     LOS: 5 days   Time spent: 15 just address things clearly  Nita Sells, MD [facility] To contact the attending provider between 7A-7P or the covering provider during after hours 7P-7A, please log into the web site [email or web address] and access using universal [place] password for that web site. If you do not have the password, please call the hospital operator.  01/22/2021, 9:40 AM

## 2021-01-22 NOTE — Progress Notes (Signed)
RE: Haley Singh Date of Birth: Jun 12, 1950 Date: 01/22/2021   MUST ID: [id number]   To Who It May Concern:   Please be advised that the above named patient will require a short-term nursing home stay- anticipated 30 days or less rehabilitation and strengthening. The plan is for return home.

## 2021-01-22 NOTE — Progress Notes (Signed)
Physical Therapy Treatment Patient Details Name: Haley Singh MRN: [id number] DOB: Jan 27, 1951 Today's Date: 01/22/2021    History of Present Illness Pt is 70 y.o. female who presented to ED with sudden onset of severe back pain, septic arthritis suspected?Marland Kitchen PMH significant for chronic back pain, OA, HTN, HLD, asthma, depression, anxiety, insomnia, and R ankle ORIF (02/2007).    PT Comments    General Comments: AxO x 3 very willing to try but limited by fear of increasing any "spasms" General bed mobility comments: performed partial "lof rolling" very difficult with heavy use B UE's to support self.  Visibly uncomfortable.  Pre medicated.  Reports 7/10 back pain. General transfer comment: from elevated bed asissted to [facility].  Also applied LSO while seated.  Pt unaware when to use.  "they gave it to me last Tuesday". Limited sitting tolerance on BSC due to increased c/o "spasms".  Then assisted off bed BSC, unable to attempt any steps as pt was unable to weightshift or advance   Switched BSC with recliner from behind as pt stood. General Gait Details: pt was unable to weightshift enough to advance either LE.  Heavy lean on walker with B UE's. Back brace does not offer much relief as pain is infectious in nature Pt was home alone and Indep prior to admit and will need ST Rehab at SNF  Follow Up Recommendations  SNF     Equipment Recommendations       Recommendations for Other Services       Precautions / Restrictions Precautions Precautions: Fall Precaution Comments: LSO Back brace ?  (? brace for pain/comfort) no specific instructions on use Restrictions Weight Bearing Restrictions: No    Mobility  Bed Mobility Overal bed mobility: Needs Assistance Bed Mobility: Rolling Rolling: Min assist;Mod assist         General bed mobility comments: performed partial "lof rolling" very difficult with heavy use B UE's to support self.  Visibly uncomfortable.  Pre medicated.  Reports 7/10  back pain.    Transfers Overall transfer level: Needs assistance Equipment used: Rolling walker (2 wheeled) Transfers: Sit to/from Stand           General transfer comment: from elevated bed asissted to [facility].  Also applied LSO while seated.  Pt unaware when to use.  "they gave it to me last Tuesday". Limited sitting tolerance on BSC due to increased c/o "spasms".  Then assisted off bed BSC, unable to attempt any steps as pt was unable to weightshift or advance   Switched BSC with recliner from behind as pt stood.  Ambulation/Gait             General Gait Details: pt was unable to weightshift enough to advance either LE.  Heavy lean on walker with B UE's.   Stairs             Wheelchair Mobility    Modified Rankin (Stroke Patients Only)       Balance                                            Cognition Arousal/Alertness: Awake/alert Behavior During Therapy: WFL for tasks assessed/performed Overall Cognitive Status: Within Functional Limits for tasks assessed  General Comments: AxO x 3 very willing to try but limited by fear of increasing any "spasms"      Exercises      General Comments        Pertinent Vitals/Pain Pain Assessment: 0-10 Pain Score: 8  Pain Location: back pain with activity/mvmt (pre medicated) Max "spasms" Pain Descriptors / Indicators: Squeezing;Cramping Pain Intervention(s): Monitored during session;Premedicated before session    Home Living                      Prior Function            PT Goals (current goals can now be found in the care plan section) Progress towards PT goals: Progressing toward goals    Frequency    Min 2X/week      PT Plan Current plan remains appropriate    Co-evaluation              AM-PAC PT "6 Clicks" Mobility   Outcome Measure  Help needed turning from your back to your side while in a flat bed without using  bedrails?: A Lot Help needed moving from lying on your back to sitting on the side of a flat bed without using bedrails?: A Lot Help needed moving to and from a bed to a chair (including a wheelchair)?: A Lot Help needed standing up from a chair using your arms (e.g., wheelchair or bedside chair)?: A Lot Help needed to walk in hospital room?: Total Help needed climbing 3-5 steps with a railing? : Total 6 Click Score: 10    End of Session Equipment Utilized During Treatment: Gait belt;Back brace Activity Tolerance: Patient limited by pain Patient left: in chair;with call bell/phone within reach;with chair alarm set;Other (comment) Nurse Communication: Mobility status PT Visit Diagnosis: Unsteadiness on feet (R26.81);Difficulty in walking, not elsewhere classified (R26.2);Pain     Time: 1517-6160 PT Time Calculation (min) (ACUTE ONLY): 35 min  Charges:  $Therapeutic Activity: 23-37 mins                    Felecia Shelling  PTA Acute  Rehabilitation Services Pager      [phone] Office      [phone]

## 2021-01-22 NOTE — NC FL2 (Addendum)
[place] MEDICAID FL2 LEVEL OF CARE SCREENING TOOL     IDENTIFICATION  Patient Name: Haley Singh Birthdate: Oct 21, 1950 Sex: female Admission Date (Current Location): 01/17/2021  [facility] and [place] Number:  Producer, television/film/video and Address:  [facility],  [address] [place] [address]      Provider Number: [id number]  Attending Physician Name and Address:  Rhetta Mura, MD  Relative Name and Phone Number:  Zara Chess Clearence Cheek)   [phone]    Current Level of Care: Hospital Recommended Level of Care: Skilled Nursing Facility Prior Approval Number:    Date Approved/Denied:   PASRR Number: [id number]  Discharge Plan: SNF    Current Diagnoses: Patient Active Problem List   Diagnosis Date Noted   MSSA bacteremia    Discitis of lumbar region    Osteomyelitis (HCC) 01/17/2021   Mild persistent asthma without complication 05/17/2016   OSA (obstructive sleep apnea) 03/03/2014    Orientation RESPIRATION BLADDER Height & Weight     Self, Time, Situation, Place  O2 (2L) Continent Weight: 200 lb (90.7 kg) Height:  5\' 6"  (167.6 cm)  BEHAVIORAL SYMPTOMS/MOOD NEUROLOGICAL BOWEL NUTRITION STATUS      Continent Diet  AMBULATORY STATUS COMMUNICATION OF NEEDS Skin   Extensive Assist Verbally Normal, Other (Comment) (Blister on buttocks)                       Personal Care Assistance Level of Assistance  Bathing, Dressing, Feeding, Total care Bathing Assistance: Maximum assistance Feeding assistance: Independent Dressing Assistance: Limited assistance Total Care Assistance: Limited assistance   Functional Limitations Info  Sight, Hearing, Speech Sight Info: Adequate Hearing Info: Adequate Speech Info: Adequate    SPECIAL CARE FACTORS FREQUENCY  PT (By licensed PT), OT (By licensed OT)     PT Frequency: 5 times weekly OT Frequency: 5 times weekly            Contractures Contractures Info: Not present    Additional Factors  Info  Code Status, Allergies, Psychotropic Code Status Info: FULL Allergies Info: Oxycontin, Penicillins Tetracyclines and related Psychotropic Info: gabapentin (NEURONTIN) capsule 600 mg, traZODone (DESYREL) tablet 150 mg         Current Medications (01/23/2021):  This is the current hospital active medication list Current Facility-Administered Medications  Medication Dose Route Frequency Provider Last Rate Last Admin   (feeding supplement) PROSource Plus liquid 30 mL  30 mL Oral BID BM Samtani, Jai-Gurmukh, MD   30 mL at 01/22/21 1529   0.9 %  sodium chloride infusion   Intravenous PRN 01/24/21, MD 10 mL/hr at 01/21/21 1814 250 mL at 01/21/21 1814   acetaminophen (TYLENOL) tablet 650 mg  650 mg Oral Q6H PRN 01/23/21 T, MD   650 mg at 01/22/21 1821   Or   acetaminophen (TYLENOL) suppository 650 mg  650 mg Rectal Q6H PRN 01/24/21 T, MD       albuterol (PROVENTIL) (2.5 MG/3ML) 0.083% nebulizer solution 2.5 mg  2.5 mg Inhalation Q6H PRN Mikey College T, MD       amLODipine (NORVASC) tablet 5 mg  5 mg Oral Daily Mikey College T, MD   5 mg at 01/22/21 1004   atorvastatin (LIPITOR) tablet 40 mg  40 mg Oral Daily 01/24/21 T, MD   40 mg at 01/22/21 1004   bisacodyl (DULCOLAX) EC tablet 5 mg  5 mg Oral Daily PRN 01/24/21, MD       ceFAZolin (  ANCEF) IVPB 2g/100 mL premix  2 g Intravenous Q8H Vu, Trung T, MD 200 mL/hr at 01/23/21 0533 2 g at 01/23/21 0533   celecoxib (CELEBREX) capsule 200-400 mg  200-400 mg Oral Daily PRN Mikey College T, MD   400 mg at 01/20/21 1347   enoxaparin (LOVENOX) injection 40 mg  40 mg Subcutaneous Q24H Mikey College T, MD   40 mg at 01/22/21 2231   fluticasone (FLONASE) 50 MCG/ACT nasal spray 1 spray  1 spray Each Nare BID Mikey College T, MD   1 spray at 01/22/21 2233   fluticasone furoate-vilanterol (BREO ELLIPTA) 100-25 MCG/INH 1 puff  1 puff Inhalation Daily Mikey College T, MD   1 puff at 01/23/21 0718   gabapentin (NEURONTIN) capsule 600 mg  600 mg Oral  QHS Mikey College T, MD   600 mg at 01/22/21 2231   lidocaine (LIDODERM) 5 % 1 patch  1 patch Transdermal Q24H Rhetta Mura, MD   1 patch at 01/22/21 1529   loratadine (CLARITIN) tablet 10 mg  10 mg Oral Daily Mikey College T, MD   10 mg at 01/22/21 1004   losartan (COZAAR) tablet 50 mg  50 mg Oral Daily Mikey College T, MD   50 mg at 01/22/21 1004   methocarbamol (ROBAXIN) 500 mg in dextrose 5 % 50 mL IVPB  500 mg Intravenous Q6H PRN Rhetta Mura, MD 100 mL/hr at 01/22/21 0249 500 mg at 01/22/21 0249   montelukast (SINGULAIR) tablet 10 mg  10 mg Oral QHS Mikey College T, MD   10 mg at 01/22/21 2232   ondansetron (ZOFRAN) tablet 4 mg  4 mg Oral Q6H PRN Mikey College T, MD   4 mg at 01/20/21 1347   Or   ondansetron (ZOFRAN) injection 4 mg  4 mg Intravenous Q6H PRN Mikey College T, MD   4 mg at 01/22/21 2053   oxyCODONE (OXYCONTIN) 12 hr tablet 15 mg  15 mg Oral Q12H Rhetta Mura, MD   15 mg at 01/22/21 2232   oxyCODONE-acetaminophen (PERCOCET/ROXICET) 5-325 MG per tablet 1 tablet  1 tablet Oral Q4H PRN Rhetta Mura, MD   1 tablet at 01/23/21 0111   protein supplement (ENSURE MAX) liquid  11 oz Oral Daily Rhetta Mura, MD   11 oz at 01/22/21 1009   senna-docusate (Senokot-S) tablet 1 tablet  1 tablet Oral BID Rhetta Mura, MD   1 tablet at 01/22/21 2232   SUMAtriptan (IMITREX) tablet 50 mg  50 mg Oral Daily PRN Mikey College T, MD       traZODone (DESYREL) tablet 150 mg  150 mg Oral QHS Mikey College T, MD   150 mg at 01/22/21 2232   venlafaxine XR (EFFEXOR-XR) 24 hr capsule 150 mg  150 mg Oral q morning Mikey College T, MD   150 mg at 01/22/21 1009     Discharge Medications: Please see discharge summary for a list of discharge medications.  Relevant Imaging Results:  Relevant Lab Results:   Additional Information SSN: [id number]  Deolinda Frid, LCSW

## 2021-01-22 NOTE — TOC Initial Note (Signed)
Transition of Care [facility]) - Initial/Assessment Note    Patient Details  Name: Haley Singh MRN: [id number] Date of Birth: 04-27-1951  Transition of [facility]) CM/SW Contact:    Villa Herb, LCSWA Phone Number: 01/22/2021, 10:51 AM  Clinical Narrative:                 CSW spoke with pt to complete assessment. Pt states that she lives alone. Pt is typically independent in completing ADLs and providing herself transportation. Pt had private pay [facility] services many years ago. Pt has a rollator and canes to use in the home when needed. CSW spoke with pt about PT recommending SNF, she is agreeable to referral being sent out. Pt would prefer to go to [facility] for SNF but is agreeable to referral being sent over the county. Referral to be sent out. PASRR started. Fl2 completed. TOC to follow for bed offers.   Expected Discharge Plan: Skilled Nursing Facility Barriers to Discharge: Continued Medical Work up   Patient Goals and CMS Choice Patient states their goals for this hospitalization and ongoing recovery are:: Go to SNF CMS Medicare.gov Compare Post Acute Care list provided to:: Patient Choice offered to / list presented to : Patient  Expected Discharge Plan and Services Expected Discharge Plan: Skilled Nursing Facility In-house Referral: Clinical Social Work Discharge Planning Services: CM Consult Post Acute Care Choice: Skilled Nursing Facility Living arrangements for the past 2 months: Single Family Home                                      Prior Living Arrangements/Services Living arrangements for the past 2 months: Single Family Home Lives with:: Self Patient language and need for interpreter reviewed:: Yes Do you feel safe going back to the place where you live?: Yes      Need for Family Participation in Patient Care: Yes (Comment) Care giver support system in place?: Yes (comment) Current home services: DME Criminal Activity/Legal Involvement Pertinent to  Current Situation/Hospitalization: No - Comment as needed  Activities of Daily Living Home Assistive Devices/Equipment: Eyeglasses, Dan Humphreys (specify type) (reading glasses) ADL Screening (condition at time of admission) Patient's cognitive ability adequate to safely complete daily activities?: Yes Is the patient deaf or have difficulty hearing?: No Does the patient have difficulty seeing, even when wearing glasses/contacts?: No Does the patient have difficulty concentrating, remembering, or making decisions?: Yes Patient able to express need for assistance with ADLs?: Yes Does the patient have difficulty dressing or bathing?: No Independently performs ADLs?: No Communication: Independent Dressing (OT): Needs assistance Is this a change from baseline?: Change from baseline, expected to last >3 days Grooming: Independent Feeding: Independent Bathing: Needs assistance Is this a change from baseline?: Change from baseline, expected to last >3 days Toileting: Needs assistance Is this a change from baseline?: Change from baseline, expected to last >3days In/Out Bed: Needs assistance Is this a change from baseline?: Change from baseline, expected to last >3 days Walks in Home: Dependent Is this a change from baseline?: Change from baseline, expected to last >3 days Does the patient have difficulty walking or climbing stairs?: Yes Weakness of Legs: Both Weakness of Arms/Hands: Both  Permission Sought/Granted                  Emotional Assessment Appearance:: Appears stated age Attitude/Demeanor/Rapport: Engaged Affect (typically observed): Accepting Orientation: : Oriented to Self, Oriented to  Place, Oriented to  Time, Oriented to Situation Alcohol / Substance Use: Not Applicable Psych Involvement: No (comment)  Admission diagnosis:  Osteomyelitis [facility]) [M86.9] Patient Active Problem List   Diagnosis Date Noted   MSSA bacteremia    Discitis of lumbar region    Osteomyelitis  (HCC) 01/17/2021   Mild persistent asthma without complication 05/17/2016   OSA (obstructive sleep apnea) 03/03/2014   PCP:  Macy Mis, MD Pharmacy:   [id number] - [place], [place] - [address] AT [facility] [address] [place] [address] Phone: [phone] Fax: [phone]  [id number] - [place], [place] - [address]  [place] [address] Phone: [phone] Fax: [phone]     Social Determinants of Health (SDOH) Interventions    Readmission Risk Interventions Readmission Risk Prevention Plan 01/22/2021  Medication Screening Complete  Transportation Screening Complete  Some recent data might be hidden

## 2021-01-23 ENCOUNTER — Inpatient Hospital Stay (HOSPITAL_COMMUNITY): Payer: Medicare PPO

## 2021-01-23 DIAGNOSIS — M545 Low back pain, unspecified: Secondary | ICD-10-CM | POA: Diagnosis not present

## 2021-01-23 DIAGNOSIS — M868X8 Other osteomyelitis, other site: Secondary | ICD-10-CM | POA: Diagnosis not present

## 2021-01-23 DIAGNOSIS — M4646 Discitis, unspecified, lumbar region: Secondary | ICD-10-CM | POA: Diagnosis not present

## 2021-01-23 LAB — CULTURE, BLOOD (ROUTINE X 2): Special Requests: ADEQUATE

## 2021-01-23 LAB — AEROBIC/ANAEROBIC CULTURE W GRAM STAIN (SURGICAL/DEEP WOUND): Culture: NO GROWTH

## 2021-01-23 MED ORDER — IOHEXOL 350 MG/ML SOLN
80.0000 mL | Freq: Once | INTRAVENOUS | Status: AC | PRN
  Administered 2021-01-23: 80 mL via INTRAVENOUS

## 2021-01-23 MED ORDER — POLYETHYLENE GLYCOL 3350 17 G PO PACK
17.0000 g | PACK | Freq: Every day | ORAL | Status: DC
  Administered 2021-01-23 – 2021-01-25 (×3): 17 g via ORAL
  Filled 2021-01-23 (×5): qty 1

## 2021-01-23 MED ORDER — OXYCODONE-ACETAMINOPHEN 5-325 MG PO TABS
1.0000 | ORAL_TABLET | ORAL | Status: DC | PRN
  Administered 2021-01-24 – 2021-01-25 (×2): 1 via ORAL
  Filled 2021-01-23 (×2): qty 1

## 2021-01-23 MED ORDER — IOHEXOL 9 MG/ML PO SOLN
500.0000 mL | ORAL | Status: AC
  Administered 2021-01-23 (×2): 500 mL via ORAL

## 2021-01-23 MED ORDER — OXYCODONE-ACETAMINOPHEN 5-325 MG PO TABS
1.0000 | ORAL_TABLET | Freq: Once | ORAL | Status: AC
  Administered 2021-01-23: 1 via ORAL
  Filled 2021-01-23: qty 1

## 2021-01-23 NOTE — Progress Notes (Signed)
[facility]  Date of Admission:  01/17/2021     CC: Mssa bacteremia Lumbar spine osteomyelitis  Lines:  Peripheral iv's   Abx: 8/18-c cefazolin   8/16-18 vanc                                                        Assessment: 70 yo female lumbago, OA, htn/hlp, asthma, admitted 8/16 with progressive 7 day acute on chronic lower back pain, found to have mri imaging L4-5 vertebral om/discitis and mssa bacteremia   8/17 bcx mssa 8/17 IR aspirate (left L4-5 facet joint) cx in progress 8/19 bcx staph aureus   Source usually unclear on these patients. But so far no obvious metastatic site of infection. Presumably this is hematogenous spread of mssa bacteremia.   8/19 assessment 8/18 tte no obvious valve vegetation Repeat bcx in process Afebrile; hds No other obvious source of metastatic involvement  If repeat bcx continues to be positive despite appropriate abx, would get TEE (if tte negative) and pan body ct  -------- 8/22 assessment Persistent mssa bacteremia by 8/19 repeat bcx Back pain stable moderate-severe Sepsis had resolved  Will need source w/u given persistent bacteremia   Plan: Abd pelv ct with contrast r/o intraabd abscess Tee -- called cardiology: scheduled for wednesday Repeat blood culture Continue cefazolin 2 gram iv q8hours Discussed with primary team  I spent more than 35 minute reviewing data/chart, and coordinating care and >50% direct face to face time providing counseling/discussing diagnostics/treatment plan with patient    Active Problems:   Osteomyelitis (HCC)   Discitis of lumbar region   MSSA bacteremia   Allergies  Allergen Reactions   Oxycontin [Oxycodone] Itching   Penicillins Other (See Comments)    unk Did it involve swelling of the face/tongue/throat, SOB, or low BP? Unknown Did it involve sudden or severe rash/hives, skin peeling, or any reaction on the inside of your mouth or nose?  Unknown Did you need to seek medical attention at a hospital or doctor's office? Unknown When did it last happen?     over 10 years ago  If all above answers are "NO", may proceed with cephalosporin use.    Tetracyclines & Related Itching    Scheduled Meds:  (feeding supplement) PROSource Plus  30 mL Oral BID BM   amLODipine  5 mg Oral Daily   atorvastatin  40 mg Oral Daily   enoxaparin (LOVENOX) injection  40 mg Subcutaneous Q24H   fluticasone  1 spray Each Nare BID   fluticasone furoate-vilanterol  1 puff Inhalation Daily   gabapentin  600 mg Oral QHS   lidocaine  1 patch Transdermal Q24H   loratadine  10 mg Oral Daily   losartan  50 mg Oral Daily   montelukast  10 mg Oral QHS   oxyCODONE  15 mg Oral Q12H   Ensure Max Protein  11 oz Oral Daily   senna-docusate  1 tablet Oral BID   traZODone  150 mg Oral QHS   venlafaxine XR  150 mg Oral q morning   Continuous Infusions:  sodium chloride 250 mL (01/21/21 1814)    ceFAZolin (ANCEF) IV 2 g (01/23/21 0533)   methocarbamol (ROBAXIN) IV 500 mg (01/22/21 0249)   PRN Meds:.sodium chloride, acetaminophen **OR** acetaminophen,  albuterol, bisacodyl, celecoxib, methocarbamol (ROBAXIN) IV, ondansetron **OR** ondansetron (ZOFRAN) IV, oxyCODONE-acetaminophen, SUMAtriptan   SUBJECTIVE: Stable moderate-severe back pain No LE focal weakness OOB to chair; still with spasm in left legs Afebrile No n/v/diarrhea Feeling constipated  Repeat bcx 8/19 positive  Review of Systems: ROS All other ROS was negative, except mentioned above     OBJECTIVE: Vitals:   01/22/21 2154 01/23/21 0606 01/23/21 0719 01/23/21 1041  BP: (!) 153/80 (!) 143/72  (!) 150/69  Pulse: 90 87  93  Resp: 16 16    Temp: 97.9 F (36.6 C) 98.7 F (37.1 C)    TempSrc: Oral Oral    SpO2: 95% 92% 95% 94%  Weight:      Height:       Body mass index is 32.28 kg/m.  Physical Exam General/constitutional: no distress, pleasant HEENT: Normocephalic, PER, Conj  Clear, EOMI, Oropharynx clear Neck supple CV: rrr no mrg Lungs: clear to auscultation, normal respiratory effort Abd: Soft, Nontender Ext: no edema Skin: No Rash Neuro: nonfocal MSK: no peripheral joint tenderness  Central line presence: no   Lab Results Lab Results  Component Value Date   WBC 11.5 (H) 01/21/2021   HGB 9.9 (L) 01/21/2021   HCT 31.1 (L) 01/21/2021   MCV 89.9 01/21/2021   PLT 287 01/21/2021    Lab Results  Component Value Date   CREATININE 0.61 01/21/2021   BUN 11 01/21/2021   NA 135 01/21/2021   K 3.4 (L) 01/21/2021   CL 99 01/21/2021   CO2 27 01/21/2021    Lab Results  Component Value Date   ALT 14 01/21/2021   AST 15 01/21/2021   ALKPHOS 98 01/21/2021   BILITOT 0.7 01/21/2021      Microbiology: Recent Results (from the past 240 hour(s))  SARS CORONAVIRUS 2 (TAT 6-24 HRS) Nasopharyngeal Nasopharyngeal Swab     Status: None   Collection Time: 01/17/21  5:42 PM   Specimen: Nasopharyngeal Swab  Result Value Ref Range Status   SARS Coronavirus 2 NEGATIVE NEGATIVE Final    Comment: (NOTE) SARS-CoV-2 target nucleic acids are NOT DETECTED.  The SARS-CoV-2 RNA is generally detectable in upper and lower respiratory specimens during the acute phase of infection. Negative results do not preclude SARS-CoV-2 infection, do not rule out co-infections with other pathogens, and should not be used as the sole basis for treatment or other patient management decisions. Negative results must be combined with clinical observations, patient history, and epidemiological information. The expected result is Negative.  Fact Sheet for Patients: HairSlick.no  Fact Sheet for Healthcare Providers: quierodirigir.com  This test is not yet approved or cleared by the [place] FDA and  has been authorized for detection and/or diagnosis of SARS-CoV-2 by FDA under an Emergency Use Authorization (EUA). This EUA  will remain  in effect (meaning this test can be used) for the duration of the COVID-19 declaration under Se ction 564(b)(1) of the Act, 21 U.S.C. section 360bbb-3(b)(1), unless the authorization is terminated or revoked sooner.  Performed at [facility], [address], [place], [place] [address]   Aerobic/Anaerobic Culture w Gram Stain (surgical/deep wound)     Status: None (Preliminary result)   Collection Time: 01/18/21  2:52 PM   Specimen: Abscess  Result Value Ref Range Status   Specimen Description   Final    ABSCESS VERTEBRA Performed at [facility], [address], [place], [place] [address]    Special Requests   Final  NONE Performed at [facility], [address], [place], [place] [address]    Gram Stain   Final    RARE WBC PRESENT, PREDOMINANTLY MONONUCLEAR NO ORGANISMS SEEN    Culture   Final    NO GROWTH 4 DAYS NO ANAEROBES ISOLATED; CULTURE IN PROGRESS FOR 5 DAYS Performed at [facility], [address] [place], [place] [address]    Report Status PENDING  Incomplete  Culture, blood (Routine X 2) w Reflex to ID Panel     Status: Abnormal   Collection Time: 01/18/21  5:08 PM   Specimen: BLOOD  Result Value Ref Range Status   Specimen Description   Final    BLOOD LEFT ANTECUBITAL Performed at [facility], [address], [place], [place] [address]    Special Requests   Final    BOTTLES DRAWN AEROBIC AND ANAEROBIC Blood Culture adequate volume Performed at [facility], [address], [place], [place] [address]    Culture  Setup Time   Final    GRAM POSITIVE COCCI IN CLUSTERS IN BOTH AEROBIC AND ANAEROBIC BOTTLES CRITICAL VALUE NOTED.  VALUE IS CONSISTENT WITH PREVIOUSLY REPORTED AND CALLED VALUE.    Culture (A)  Final    STAPHYLOCOCCUS AUREUS SUSCEPTIBILITIES PERFORMED ON PREVIOUS CULTURE WITHIN THE LAST 5 DAYS. Performed at [facility], [address],  [place], [place] [address]    Report Status 01/21/2021 FINAL  Final  Culture, blood (Routine X 2) w Reflex to ID Panel     Status: Abnormal   Collection Time: 01/18/21  5:08 PM   Specimen: BLOOD RIGHT HAND  Result Value Ref Range Status   Specimen Description   Final    BLOOD RIGHT HAND Performed at [facility], [address] [place], [place] [address]    Special Requests   Final    BOTTLES DRAWN AEROBIC AND ANAEROBIC Blood Culture adequate volume Performed at [facility], [address] [place], [place] [address]    Culture  Setup Time   Final    GRAM POSITIVE COCCI IN CLUSTERS IN BOTH AEROBIC AND ANAEROBIC BOTTLES CRITICAL RESULT CALLED TO, READ BACK BY AND VERIFIED WITH: PHARMD MARY SWAYNE 01/19/2021 @1204  BY JW Performed at [facility], [address] [place], [place]    Culture STAPHYLOCOCCUS AUREUS (A)  Final   Report Status 01/21/2021 FINAL  Final   Organism ID, Bacteria STAPHYLOCOCCUS AUREUS  Final      Susceptibility   Staphylococcus aureus - MIC*    CIPROFLOXACIN >=8 RESISTANT Resistant     ERYTHROMYCIN <=0.25 SENSITIVE Sensitive     GENTAMICIN <=0.5 SENSITIVE Sensitive     OXACILLIN <=0.25 SENSITIVE Sensitive     TETRACYCLINE <=1 SENSITIVE Sensitive     VANCOMYCIN <=0.5 SENSITIVE Sensitive     TRIMETH/SULFA <=10 SENSITIVE Sensitive     CLINDAMYCIN <=0.25 SENSITIVE Sensitive     RIFAMPIN <=0.5 SENSITIVE Sensitive     Inducible Clindamycin NEGATIVE Sensitive     * STAPHYLOCOCCUS AUREUS  Blood Culture ID Panel (Reflexed)     Status: Abnormal   Collection Time: 01/18/21  5:08 PM  Result Value Ref Range Status   Enterococcus faecalis NOT DETECTED NOT DETECTED Final   Enterococcus Faecium NOT DETECTED NOT DETECTED Final   Listeria monocytogenes NOT DETECTED NOT DETECTED Final   Staphylococcus species DETECTED (A) NOT DETECTED Final    Comment: CRITICAL RESULT CALLED TO, READ BACK BY  AND VERIFIED WITH: PHARMD MARY  SWAYNE 01/19/2021  BY JW    Staphylococcus aureus (BCID) DETECTED (A) NOT DETECTED Final    Comment: CRITICAL RESULT CALLED TO, READ BACK BY AND VERIFIED WITH: PHARMD MARY SWAYNE 01/19/2021  BY JW    Staphylococcus epidermidis NOT DETECTED NOT DETECTED Final   Staphylococcus lugdunensis NOT DETECTED NOT DETECTED Final   Streptococcus species NOT DETECTED NOT DETECTED Final   Streptococcus agalactiae NOT DETECTED NOT DETECTED Final   Streptococcus pneumoniae NOT DETECTED NOT DETECTED Final   Streptococcus pyogenes NOT DETECTED NOT DETECTED Final   A.calcoaceticus-baumannii NOT DETECTED NOT DETECTED Final   Bacteroides fragilis NOT DETECTED NOT DETECTED Final   Enterobacterales NOT DETECTED NOT DETECTED Final   Enterobacter cloacae complex NOT DETECTED NOT DETECTED Final   Escherichia coli NOT DETECTED NOT DETECTED Final   Klebsiella aerogenes NOT DETECTED NOT DETECTED Final   Klebsiella oxytoca NOT DETECTED NOT DETECTED Final   Klebsiella pneumoniae NOT DETECTED NOT DETECTED Final   Proteus species NOT DETECTED NOT DETECTED Final   Salmonella species NOT DETECTED NOT DETECTED Final   Serratia marcescens NOT DETECTED NOT DETECTED Final   Haemophilus influenzae NOT DETECTED NOT DETECTED Final   Neisseria meningitidis NOT DETECTED NOT DETECTED Final   Pseudomonas aeruginosa NOT DETECTED NOT DETECTED Final   Stenotrophomonas maltophilia NOT DETECTED NOT DETECTED Final   Candida albicans NOT DETECTED NOT DETECTED Final   Candida auris NOT DETECTED NOT DETECTED Final   Candida glabrata NOT DETECTED NOT DETECTED Final   Candida krusei NOT DETECTED NOT DETECTED Final   Candida parapsilosis NOT DETECTED NOT DETECTED Final   Candida tropicalis NOT DETECTED NOT DETECTED Final   Cryptococcus neoformans/gattii NOT DETECTED NOT DETECTED Final   Meth resistant mecA/C and MREJ NOT DETECTED NOT DETECTED Final    Comment: Performed at [facility], [address] [place], [place]  [address]  Culture, blood (routine x 2)     Status: Abnormal   Collection Time: 01/20/21  4:26 AM   Specimen: BLOOD  Result Value Ref Range Status   Specimen Description   Final    BLOOD LEFT ANTECUBITAL Performed at [facility], [address] [place], [place] [address]    Special Requests   Final    BOTTLES DRAWN AEROBIC AND ANAEROBIC Blood Culture adequate volume Performed at [facility], [address], [place], [place] [address]    Culture  Setup Time   Final    GRAM POSITIVE COCCI IN CLUSTERS ANAEROBIC BOTTLE ONLY CRITICAL VALUE NOTED.  VALUE IS CONSISTENT WITH PREVIOUSLY REPORTED AND CALLED VALUE.    Culture (A)  Final    STAPHYLOCOCCUS AUREUS SUSCEPTIBILITIES PERFORMED ON PREVIOUS CULTURE WITHIN THE LAST 5 DAYS. Performed at [facility], [address] [place], [place] [address]    Report Status 01/23/2021 FINAL  Final  Culture, blood (routine x 2)     Status: None (Preliminary result)   Collection Time: 01/20/21  4:32 AM   Specimen: BLOOD RIGHT HAND  Result Value Ref Range Status   Specimen Description   Final    BLOOD RIGHT HAND Performed at [facility], [address], [place], [place] [address]    Special Requests   Final    BOTTLES DRAWN AEROBIC ONLY Blood Culture adequate volume Performed at [facility], [address] [place], [place] [address]    Culture  Setup Time   Final    GRAM POSITIVE COCCI  IN CLUSTERS AEROBIC BOTTLE ONLY CRITICAL VALUE NOTED.  VALUE IS CONSISTENT WITH PREVIOUSLY REPORTED AND CALLED VALUE. Performed at [facility], [address] [place], [place] [id number]    Culture GRAM POSITIVE COCCI IN CLUSTERS  Final   Report Status PENDING  Incomplete     Serology:   Imaging: If present, new imagings (plain films, ct scans, and mri) have been personally visualized and interpreted; radiology reports have been reviewed. Decision making incorporated  into the Impression / Recommendations.  8/16 mri lumbar spine 1. Increased T2 signal about and disruption of the left L4-L5 facets, with increased T2 signal in posterior L5 vertebral body, left pedicle, and surrounding soft tissues. Edema and osseous destruction cause mild spinal canal narrowing. This could represent acute fracture or infection. Correlate with history of trauma and consider CT for further osseous evaluation. 2. L2-L3 mild canal, moderate left neural foraminal narrowing and mild-to-moderate right neural foraminal narrowing.  8/18 tte  1. Left ventricular ejection fraction, by estimation, is 60 to 65%. The  left ventricle has normal function. The left ventricle has no regional  wall motion abnormalities. There is moderate asymmetric left ventricular  hypertrophy of the basal-septal  segment. Left ventricular diastolic parameters were normal.   2. Right ventricular systolic function is normal. The right ventricular  size is normal. There is normal pulmonary artery systolic pressure. The  estimated right ventricular systolic pressure is 27.9 mmHg.   3. The mitral valve is normal in structure. Trivial mitral valve  regurgitation. No evidence of mitral stenosis.   4. The aortic valve was not well visualized. Aortic valve regurgitation  is not visualized. No aortic stenosis is present.   5. The inferior vena cava is normal in size with <50% respiratory  variability, suggesting right atrial pressure of 8 mmHg.   Conclusion(s)/Recommendation(s): No vegetation seen. If high clinical  suspicion for endocarditis, recommend TEE.   Raymondo Bandrung T Nefertari Rebman, MD [facility] [phone] pager    01/23/2021, 11:06 AM

## 2021-01-23 NOTE — Care Management Important Message (Signed)
Important Message  Patient Details IM Letter given to the Patient. Name: Haley Singh MRN: [id number] Date of Birth: 19-Apr-1951   Medicare Important Message Given:  Yes     Caren Macadam 01/23/2021, 1:47 PM

## 2021-01-23 NOTE — Plan of Care (Signed)

## 2021-01-23 NOTE — Progress Notes (Signed)
PROGRESS NOTE   Haley Singh  OBS:[id number] DOB: 07-Sep-1950 DOA: 01/17/2021 PCP: Katherina Mires, MD   Brief Narrative:   70 year old white female Asthma HTN depression hyperlipidemia previous bilateral renal stones with double-J stent status post lithotripsy 2008--subsequent multiple procedures 08/26/2019 and 10/12/2019 Small bowel obstruction 2005 requiring laparotomy for small bowel resection and primary anastomosis  Admitted 8/16 with severe back pain Work-up showed  L4-L5, L5-S1 T2 signal in L5 vertebral body with mild canal narrowing?  Infection WBC 12 ESR over 100 CRP 19 no phlegmon on CT Neurosurgery consulted recommended conservative management  Grew MSSA and ID consulted  Pain med's tailored form PCA Except she will be here through the weekend until pain more tolerable and we can finalize Abx  Hospital-Problem based course   MSSA Sepsis on admission secondary to possible facet joint infection? 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 BC on 8/22 PCA d/c-8/21-continue OxyContin 15 bid and percocet 5/325 q4 prn--escalate meds as needed--can give extra dose perc today--increase from 1-2 tabs of Oxy Robaxin 500 x 3 times daily--spasm present and not really responsive to IV Cont Gabapentin 900  continue Celebrex 200-400 for moderate pain Vancomycin changed to Ancef per ID Awaiting result 01/23/21 CT whole body and TEE to search for persistent source of bacteremia Prior bilateral renal stones status post lithotripsy 2008 2021 Monitor trends Migraines Continue Imitrex 50 daily as needed HTN Uncontrolled Continue amlodipine 5, losartan 50 Depression Continue trazodone 150 at bedtime, Effexor 150 qd Neuropathy NOS See above   DVT prophylaxis: Lovenox Code Status: Full Family Communication: None Disposition:  Status is: Inpatient  Remains inpatient appropriate because:Hemodynamically unstable, Ongoing active pain requiring inpatient pain management, and Altered mental status  Dispo:  The patient is from: Home              Anticipated d/c is to: SNF              Patient currently is not medically stable to d/c.   Difficult to place patient No   Consultants:  Neurosurgery consulted IR consulted  Procedures: None yet  Antimicrobials:     Subjective:  Back from CT  Still has MSSA in blood Needs TEE on Wednesday Overall in pain and RN and I discussed increasing her oral meds I have asked patient to put on her brace  Objective: Vitals:   01/22/21 2154 01/23/21 0606 01/23/21 0719 01/23/21 1041  BP: (!) 153/80 (!) 143/72  (!) 150/69  Pulse: 90 87  93  Resp: 16 16    Temp: 97.9 F (36.6 C) 98.7 F (37.1 C)    TempSrc: Oral Oral    SpO2: 95% 92% 95% 94%  Weight:      Height:        Intake/Output Summary (Last 24 hours) at 01/23/2021 1606 Last data filed at 01/23/2021 1000 Gross per 24 hour  Intake 1507.75 ml  Output 1550 ml  Net -42.25 ml    Filed Weights   01/17/21 1028  Weight: 90.7 kg    Examination:  Coherent-uncomfortable  CTA B no rales no rhonchi Abdomen soft  weak straight leg raise is negative SLR neg but limited by effort, weak  Data Reviewed: personally reviewed   CBC    Component Value Date/Time   WBC 11.5 (H) 01/21/2021 0445   RBC 3.46 (L) 01/21/2021 0445   HGB 9.9 (L) 01/21/2021 0445   HCT 31.1 (L) 01/21/2021 0445   PLT 287 01/21/2021 0445   MCV 89.9 01/21/2021 0445   MCH  28.6 01/21/2021 0445   MCHC 31.8 01/21/2021 0445   RDW 14.2 01/21/2021 0445   LYMPHSABS 0.5 (L) 01/21/2021 0445   MONOABS 0.9 01/21/2021 0445   EOSABS 0.0 01/21/2021 0445   BASOSABS 0.0 01/21/2021 0445   CMP Latest Ref Rng & Units 01/21/2021 01/20/2021 01/19/2021  Glucose 70 - 99 mg/dL 123(H) 115(H) 151(H)  BUN 8 - 23 mg/dL _0 Creatinine 0.44 - 1.00 mg/dL 0.61 0.77 0.75  Sodium 135 - 145 mmol/L 135 134(L) 134(L)  Potassium 3.5 - 5.1 mmol/L 3.4(L) 3.5 4.0  Chloride 98 - 111 mmol/L 99 99 99  CO2 22 - 32 mmol/L _1 Calcium 8.9 - 10.3  mg/dL 8.2(L) 8.1(L) 8.3(L)  Total Protein 6.5 - 8.1 g/dL 7.5 7.2 7.4  Total Bilirubin 0.3 - 1.2 mg/dL 0.7 0.7 1.2  Alkaline Phos 38 - 126 U/L 98 87 96  AST 15 - 41 U/L 15 14(L) 11(L)  ALT 0 - 44 U/L _2 Radiology Studies: No results found.   Scheduled Meds:  (feeding supplement) PROSource Plus  30 mL Oral BID BM   amLODipine  5 mg Oral Daily   atorvastatin  40 mg Oral Daily   enoxaparin (LOVENOX) injection  40 mg Subcutaneous Q24H   fluticasone  1 spray Each Nare BID   fluticasone furoate-vilanterol  1 puff Inhalation Daily   gabapentin  600 mg Oral QHS   lidocaine  1 patch Transdermal Q24H   loratadine  10 mg Oral Daily   losartan  50 mg Oral Daily   montelukast  10 mg Oral QHS   oxyCODONE  15 mg Oral Q12H   polyethylene glycol  17 g Oral Daily   Ensure Max Protein  11 oz Oral Daily   senna-docusate  1 tablet Oral BID   traZODone  150 mg Oral QHS   venlafaxine XR  150 mg Oral q morning   Continuous Infusions:  sodium chloride 250 mL (01/21/21 1814)    ceFAZolin (ANCEF) IV 2 g (01/23/21 1513)   methocarbamol (ROBAXIN) IV 500 mg (01/22/21 0249)     LOS: 6 days   Time spent: 25  Nita Sells, MD [facility] To contact the attending provider between 7A-7P or the covering provider during after hours 7P-7A, please log into the web site [email or web address] and access using universal [place] password for that web site. If you do not have the password, please call the hospital operator.  01/23/2021, 4:06 PM

## 2021-01-24 DIAGNOSIS — R7881 Bacteremia: Secondary | ICD-10-CM | POA: Diagnosis not present

## 2021-01-24 DIAGNOSIS — B9561 Methicillin susceptible Staphylococcus aureus infection as the cause of diseases classified elsewhere: Secondary | ICD-10-CM | POA: Diagnosis not present

## 2021-01-24 DIAGNOSIS — M4646 Discitis, unspecified, lumbar region: Secondary | ICD-10-CM | POA: Diagnosis not present

## 2021-01-24 DIAGNOSIS — M868X8 Other osteomyelitis, other site: Secondary | ICD-10-CM | POA: Diagnosis not present

## 2021-01-24 LAB — CBC WITH DIFFERENTIAL/PLATELET
Abs Immature Granulocytes: 0.52 10*3/uL — ABNORMAL HIGH (ref 0.00–0.07)
Basophils Absolute: 0.1 10*3/uL (ref 0.0–0.1)
Basophils Relative: 1 %
Eosinophils Absolute: 0 10*3/uL (ref 0.0–0.5)
Eosinophils Relative: 0 %
HCT: 31.4 % — ABNORMAL LOW (ref 36.0–46.0)
Hemoglobin: 9.6 g/dL — ABNORMAL LOW (ref 12.0–15.0)
Immature Granulocytes: 6 %
Lymphocytes Relative: 13 %
Lymphs Abs: 1.2 10*3/uL (ref 0.7–4.0)
MCH: 27.7 pg (ref 26.0–34.0)
MCHC: 30.6 g/dL (ref 30.0–36.0)
MCV: 90.8 fL (ref 80.0–100.0)
Monocytes Absolute: 0.8 10*3/uL (ref 0.1–1.0)
Monocytes Relative: 8 %
Neutro Abs: 6.7 10*3/uL (ref 1.7–7.7)
Neutrophils Relative %: 72 %
Platelets: 299 10*3/uL (ref 150–400)
RBC: 3.46 MIL/uL — ABNORMAL LOW (ref 3.87–5.11)
RDW: 14.4 % (ref 11.5–15.5)
WBC: 9.3 10*3/uL (ref 4.0–10.5)
nRBC: 0.2 % (ref 0.0–0.2)

## 2021-01-24 LAB — COMPREHENSIVE METABOLIC PANEL
ALT: 13 U/L (ref 0–44)
AST: 22 U/L (ref 15–41)
Albumin: 2.3 g/dL — ABNORMAL LOW (ref 3.5–5.0)
Alkaline Phosphatase: 92 U/L (ref 38–126)
Anion gap: 10 (ref 5–15)
BUN: 10 mg/dL (ref 8–23)
CO2: 29 mmol/L (ref 22–32)
Calcium: 8.3 mg/dL — ABNORMAL LOW (ref 8.9–10.3)
Chloride: 98 mmol/L (ref 98–111)
Creatinine, Ser: 0.57 mg/dL (ref 0.44–1.00)
GFR, Estimated: >60 mL/min (ref >60)
Glucose, Bld: 106 mg/dL — ABNORMAL HIGH (ref 70–99)
Potassium: 3 mmol/L — ABNORMAL LOW (ref 3.5–5.1)
Sodium: 137 mmol/L (ref 135–145)
Total Bilirubin: 0.3 mg/dL (ref 0.3–1.2)
Total Protein: 7.5 g/dL (ref 6.5–8.1)

## 2021-01-24 LAB — CULTURE, BLOOD (ROUTINE X 2): Special Requests: ADEQUATE

## 2021-01-24 LAB — MAGNESIUM: Magnesium: 2 mg/dL (ref 1.7–2.4)

## 2021-01-24 MED ORDER — POTASSIUM CHLORIDE CRYS ER 20 MEQ PO TBCR: 40.0000 meq | EXTENDED_RELEASE_TABLET | Freq: Every day | ORAL | Status: DC

## 2021-01-24 MED ORDER — POTASSIUM CHLORIDE CRYS ER 20 MEQ PO TBCR
40.0000 meq | EXTENDED_RELEASE_TABLET | Freq: Two times a day (BID) | ORAL | Status: DC
  Administered 2021-01-24 – 2021-01-27 (×8): 40 meq via ORAL
  Filled 2021-01-24 (×8): qty 2

## 2021-01-24 NOTE — Progress Notes (Signed)
PROGRESS NOTE   Haley Singh  MRN:[id number] DOB: 11/16/1950 DOA: 01/17/2021 PCP: Briscoe, Kim K, MD   Brief Narrative:   70-year-old white female Asthma HTN depression hyperlipidemia previous bilateral renal stones with double-J stent status post lithotripsy 2008--subsequent multiple procedures 08/26/2019 and 10/12/2019 Small bowel obstruction 2005 requiring laparotomy for small bowel resection and primary anastomosis  Admitted 8/16 with severe back pain Work-up showed  L4-L5, L5-S1 T2 signal in L5 vertebral body with mild canal narrowing?  Infection WBC 12 ESR over 100 CRP 19 no phlegmon on CT Neurosurgery consulted recommended conservative management  Grew MSSA and ID consulted  Pain med's tailored form PCA Except she will be here through the weekend until pain more tolerable and we can finalize Abx  Hospital-Problem based course   MSSA Sepsis on admission secondary to possible facet joint infection? 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 Nundkumar on 8/23 and he reviewed films 8/23--as no overt role for surgery-hold on MRI of the back Get CRP in am Await TEE which is scheduled for 8/24, follow blood culture from 8/22 Prior bilateral renal stones status post lithotripsy 2008 2021 Monitor trends Migraines Continue Imitrex 50 daily as needed HTN Uncontrolled Continue amlodipine 5, losartan 50--moderately controlled at this time Hypokalemia Replace potassium with 40 twice daily K. Dur Check magnesium in a.m. and replace  that if needed  depression Continue trazodone 150 at bedtime, Effexor 150 qd Neuropathy NOS See above   DVT prophylaxis: Lovenox Code Status: Full Family Communication: None Disposition:  Status is: Inpatient  Remains inpatient appropriate because:Hemodynamically unstable, Ongoing active pain requiring inpatient pain management, and Altered mental status  Dispo: The patient is from: Home              Anticipated d/c is to: SNF              Patient currently is not medically stable to d/c.   Difficult to place patient No   Consultants:  Neurosurgery consulted--reconsulted Dr. Nundkumar 8/23 see his note IR consulted  Procedures: None yet  Antimicrobials:     Subjective:  Pain is better but is still severe on movement  sitting out in chair mobilized fairly well Time control bowel bladder No chest pain No chills no rigors Overall spasm remains the most concerning thing for her   Objective: Vitals:   01/23/21 1041 01/23/21 2211 01/24/21 0545 01/24/21 1348  BP: (!) 150/69 (!) 152/84 (!) 143/79 125/71  Pulse: 93 81 86 88  Resp:  17 17 18  Temp:  97.9 F (36.6 C) 98.5 F (36.9 C) 98.4 F (36.9 C)  TempSrc:  Oral Oral Oral  SpO2: 94% 93% 93% 97%  Weight:      Height:        Intake/Output Summary (Last 24 hours) at 01/24/2021 1350 Last data filed at 01/24/2021 1000 Gross per 24 hour  Intake 1407.06 ml  Output 1600 ml  Net -192.94 ml    Filed Weights   01/17/21 1028  Weight: 90.7 kg    Examination:  Coherent-uncomfortable  CTA B   no rales no rhonchi Abdomen soft weak straight leg raise is negative SLR neg but limited by effort, weak  Data Reviewed: personally reviewed   CBC    Component Value Date/Time   WBC 9.3 01/24/2021 0410   RBC 3.46 (L) 01/24/2021 0410   HGB 9.6 (L) 01/24/2021 0410   HCT 31.4 (L) 01/24/2021 0410   PLT 299 01/24/2021 0410   MCV 90.8 01/24/2021 0410   MCH 27.7 01/24/2021 0410   MCHC 30.6 01/24/2021 0410   RDW 14.4  01/24/2021 0410   LYMPHSABS 1.2 01/24/2021 0410   MONOABS 0.8 01/24/2021 0410   EOSABS 0.0 01/24/2021 0410   BASOSABS 0.1 01/24/2021 0410   CMP Latest Ref Rng & Units 01/24/2021 01/21/2021 01/20/2021  Glucose 70 - 99 mg/dL 106(H) 123(H) 115(H)  BUN 8 - 23 mg/dL _0 Creatinine 0.44 - 1.00 mg/dL 0.57 0.61 0.77  Sodium 135 - 145 mmol/L 137 135 134(L)  Potassium 3.5 - 5.1 mmol/L 3.0(L) 3.4(L) 3.5  Chloride 98 - 111 mmol/L 98 99 99  CO2 22 - 32 mmol/L _1 Calcium 8.9 - 10.3 mg/dL 8.3(L) 8.2(L) 8.1(L)  Total Protein 6.5 - 8.1 g/dL 7.5 7.5 7.2  Total Bilirubin 0.3 - 1.2 mg/dL 0.3 0.7 0.7  Alkaline Phos 38 - 126 U/L 92 98 87  AST 15 - 41 U/L 22 15 14(L)  ALT 0 - 44 U/L _2 Radiology Studies: CT ABDOMEN PELVIS W CONTRAST  Result Date: 01/23/2021 CLINICAL DATA:  History of chronic low back pain and lumbar discitis osteomyelitis, MRSA bacteremia, pelvic and thigh pain, elevated white blood cell count EXAM: CT ABDOMEN AND PELVIS WITH CONTRAST TECHNIQUE: Multidetector CT imaging of the abdomen and pelvis was performed using the standard protocol following bolus administration of intravenous contrast. CONTRAST:  66m OMNIPAQUE IOHEXOL 350 MG/ML SOLN COMPARISON:  01/17/2021, 08/12/2019 FINDINGS: Lower chest: Dependent linear consolidation within the lower lobes most consistent with atelectasis. Trace right pleural effusion. Hepatobiliary: Minimal high attenuation material within the gallbladder likely reflects sludge. Stable focal adenomyomatosis of the gallbladder fundus. No calcified gallstones or cholecystitis. The liver is unremarkable. Pancreas: Unremarkable. No pancreatic ductal dilatation or surrounding inflammatory changes. Spleen: Normal in size without focal abnormality. Adrenals/Urinary Tract: There are bilateral nonobstructing renal calculi, decreased in number since prior study. Three distinct calculi are visible within the lower pole right kidney, largest measuring 9 mm.  There are approximately 4 discrete left renal calculi, largest in the upper pole measuring 5 mm. No evidence of obstructive uropathy within either kidney. Indeterminate hypodensities are seen within the right kidney, measuring 11 mm on image 52/2 and 15 mm on image 57/2. These correspond to hyperdense areas on previous unenhanced exam. Dedicated renal MRI or CT is recommended if not previously performed. The adrenals and bladder are unremarkable. Stomach/Bowel: No bowel obstruction or ileus. Normal appendix right lower quadrant. No bowel wall thickening or inflammatory change. Vascular/Lymphatic: Continued atherosclerosis throughout the aorta and its branches. No pathologically enlarged lymph nodes. Reproductive: Uterus and bilateral adnexa are unremarkable. Other: No free intraperitoneal fluid or free gas. No abdominal wall hernia. Musculoskeletal: The destructive changes at the left L4/L5 facet are again noted, consistent with given history of osteomyelitis. Please correlate with results of recent facet joint aspiration. There is severe central canal stenosis as result of the inflammatory changes surrounding the left facet joint as well as circumferential disc bulge, which appears slightly more pronounced since prior study. Additionally, abnormal soft tissue  density is seen within the paraspinal soft tissues and retroperitoneum extending superiorly from the L4-5 level, most pronounced at L1, L2, and L3, highly concerning for ascending infection. I do not see any discrete paraspinal fluid collection or abscess at this time. Additionally, there is subtle enhancing soft tissue within the ventral aspect of the central canal from L4 through L1, concerning for possible epidural abscess. Correlation with follow-up MRI is recommended. Reconstructed images demonstrate no additional findings. IMPRESSION: 1. Progressive osteomyelitis at the left L4-5 facet joint, with evidence of ascending infection and probable epidural  abscess and paraspinal phlegmon from L1 through L4. Follow-up MRI lumbar spine with and without contrast is recommended for more detailed evaluation. 2. Bilateral nonobstructing renal calculi. No evidence of obstructive uropathy within either kidney. 3. Indeterminate hypodensities right kidney, possibly hyperdense cysts, slightly increased since prior study. Follow-up renal MRI or CT could be performed if not previously evaluated. 4. Minimal gallbladder sludge.  No cholelithiasis or cholecystitis. 5.  Aortic Atherosclerosis (ICD10-I70.0). Critical Value/emergent results were called by telephone at the time of interpretation on 01/23/2021 at 4:28 pm to provider [facility] , who verbally acknowledged these results. Electronically Signed   By: Randa Ngo M.D.   On: 01/23/2021 16:38     Scheduled Meds:  (feeding supplement) PROSource Plus  30 mL Oral BID BM   amLODipine  5 mg Oral Daily   atorvastatin  40 mg Oral Daily   enoxaparin (LOVENOX) injection  40 mg Subcutaneous Q24H   fluticasone  1 spray Each Nare BID   fluticasone furoate-vilanterol  1 puff Inhalation Daily   gabapentin  600 mg Oral QHS   lidocaine  1 patch Transdermal Q24H   loratadine  10 mg Oral Daily   losartan  50 mg Oral Daily   montelukast  10 mg Oral QHS   oxyCODONE  15 mg Oral Q12H   polyethylene glycol  17 g Oral Daily   potassium chloride  40 mEq Oral BID   Ensure Max Protein  11 oz Oral Daily   senna-docusate  1 tablet Oral BID   traZODone  150 mg Oral QHS   venlafaxine XR  150 mg Oral q morning   Continuous Infusions:  sodium chloride 250 mL (01/21/21 1814)    ceFAZolin (ANCEF) IV 2 g (01/24/21 0531)   methocarbamol (ROBAXIN) IV 500 mg (01/22/21 0249)     LOS: 7 days   Time spent: [place], MD [facility] To contact the attending provider between 7A-7P or the covering provider during after hours 7P-7A, please log into the web site [email or web address] and access using universal [place]  password for that web site. If you do not have the password, please call the hospital operator.  01/24/2021, 1:50 PM

## 2021-01-24 NOTE — H&P (View-Only) (Signed)
[facility]  Date of Admission:  01/17/2021     CC: Mssa bacteremia Lumbar spine osteomyelitis  Lines:  Peripheral iv's   Abx: 8/18-c cefazolin   8/16-18 vanc                                                        Assessment: 70 yo female lumbago, OA, htn/hlp, asthma, admitted 8/16 with progressive 7 day acute on chronic lower back pain, found to have mri imaging L4-5 vertebral om/discitis and mssa bacteremia   8/17 bcx mssa 8/17 IR aspirate (left L4-5 facet joint) cx in progress 8/19 bcx staph aureus   Source usually unclear on these patients. But so far no obvious metastatic site of infection. Presumably this is hematogenous spread of mssa bacteremia.   8/19 assessment 8/18 tte no obvious valve vegetation Repeat bcx in process Afebrile; hds No other obvious source of metastatic involvement  If repeat bcx continues to be positive despite appropriate abx, would get TEE (if tte negative) and pan body ct  -------- 8/23 assessment Ct of abd/pelv showed worsening lumbar spine epidural abscess. Clinically no LE neurological deficit or bladder/bowel incontinence. Nsg reviewed cases and deemed no surgical intervention needed at this time Patient doing pt/ot Repeat bcx 8/22 ngtd  Back pain stable moderate   Plan: Continue cefazolin iv Await tee hopefully can be done tomorrow Final abx recommendation pending TEE Anticipate 6 weeks iv abx and then at least 4 more weeks PO abx given abscess in epidural space Discussed with primary team     Active Problems:   Osteomyelitis (HCC)   Discitis of lumbar region   MSSA bacteremia   Allergies  Allergen Reactions   Oxycontin [Oxycodone] Itching   Penicillins Other (See Comments)    unk Did it involve swelling of the face/tongue/throat, SOB, or low BP? Unknown Did it involve sudden or severe rash/hives, skin peeling, or any reaction on the inside of your mouth or nose? Unknown Did  you need to seek medical attention at a hospital or doctor's office? Unknown When did it last happen?     over 10 years ago  If all above answers are "NO", may proceed with cephalosporin use.    Tetracyclines & Related Itching    Scheduled Meds:  (feeding supplement) PROSource Plus  30 mL Oral BID BM   amLODipine  5 mg Oral Daily   atorvastatin  40 mg Oral Daily   enoxaparin (LOVENOX) injection  40 mg Subcutaneous Q24H   fluticasone  1 spray Each Nare BID   fluticasone furoate-vilanterol  1 puff Inhalation Daily   gabapentin  600 mg Oral QHS   lidocaine  1 patch Transdermal Q24H   loratadine  10 mg Oral Daily   losartan  50 mg Oral Daily   montelukast  10 mg Oral QHS   oxyCODONE  15 mg Oral Q12H   polyethylene glycol  17 g Oral Daily   potassium chloride  40 mEq Oral BID   Ensure Max Protein  11 oz Oral Daily   senna-docusate  1 tablet Oral BID   traZODone  150 mg Oral QHS   venlafaxine XR  150 mg Oral q morning   Continuous Infusions:  sodium chloride 250 mL (01/21/21 1814)    ceFAZolin (  ANCEF) IV 2 g (01/24/21 1400)   methocarbamol (ROBAXIN) IV 500 mg (01/22/21 0249)   PRN Meds:.sodium chloride, acetaminophen **OR** acetaminophen, albuterol, bisacodyl, celecoxib, methocarbamol (ROBAXIN) IV, ondansetron **OR** ondansetron (ZOFRAN) IV, oxyCODONE-acetaminophen, SUMAtriptan   SUBJECTIVE: Stable back pain, controlled; doing pt/ot Afebrile Ct scan discussed No n/v constipated  Review of Systems: ROS All other ROS was negative, except mentioned above     OBJECTIVE: Vitals:   01/23/21 1041 01/23/21 2211 01/24/21 0545 01/24/21 1348  BP: (!) 150/69 (!) 152/84 (!) 143/79 125/71  Pulse: 93 81 86 88  Resp:  17 17 18   Temp:  97.9 F (36.6 C) 98.5 F (36.9 C) 98.4 F (36.9 C)  TempSrc:  Oral Oral Oral  SpO2: 94% 93% 93% 97%  Weight:      Height:       Body mass index is 32.28 kg/m.  Physical Exam General/constitutional: no distress, pleasant HEENT:  Normocephalic, PER, Conj Clear, EOMI, Oropharynx clear Neck supple CV: rrr no mrg Lungs: clear to auscultation, normal respiratory effort Abd: Soft, Nontender Ext: no edema Skin: No Rash Neuro: nonfocal MSK: mild lumbar spine tenderness   Central line presence: no    Lab Results Lab Results  Component Value Date   WBC 9.3 01/24/2021   HGB 9.6 (L) 01/24/2021   HCT 31.4 (L) 01/24/2021   MCV 90.8 01/24/2021   PLT 299 01/24/2021    Lab Results  Component Value Date   CREATININE 0.57 01/24/2021   BUN 10 01/24/2021   NA 137 01/24/2021   K 3.0 (L) 01/24/2021   CL 98 01/24/2021   CO2 29 01/24/2021    Lab Results  Component Value Date   ALT 13 01/24/2021   AST 22 01/24/2021   ALKPHOS 92 01/24/2021   BILITOT 0.3 01/24/2021      Microbiology: Recent Results (from the past 240 hour(s))  SARS CORONAVIRUS 2 (TAT 6-24 HRS) Nasopharyngeal Nasopharyngeal Swab     Status: None   Collection Time: 01/17/21  5:42 PM   Specimen: Nasopharyngeal Swab  Result Value Ref Range Status   SARS Coronavirus 2 NEGATIVE NEGATIVE Final    Comment: (NOTE) SARS-CoV-2 target nucleic acids are NOT DETECTED.  The SARS-CoV-2 RNA is generally detectable in upper and lower respiratory specimens during the acute phase of infection. Negative results do not preclude SARS-CoV-2 infection, do not rule out co-infections with other pathogens, and should not be used as the sole basis for treatment or other patient management decisions. Negative results must be combined with clinical observations, patient history, and epidemiological information. The expected result is Negative.  Fact Sheet for Patients: 01/19/21  Fact Sheet for Healthcare Providers: HairSlick.no  This test is not yet approved or cleared by the quierodirigir.com FDA and  has been authorized for detection and/or diagnosis of SARS-CoV-2 by FDA under an Emergency Use  Authorization (EUA). This EUA will remain  in effect (meaning this test can be used) for the duration of the COVID-19 declaration under Se ction 564(b)(1) of the Act, 21 U.S.C. section 360bbb-3(b)(1), unless the authorization is terminated or revoked sooner.  Performed at [facility], [address], [place], [place]   Aerobic/Anaerobic Culture w Gram Stain (surgical/deep wound)     Status: None   Collection Time: 01/18/21  2:52 PM   Specimen: Abscess  Result Value Ref Range Status   Specimen Description   Final    ABSCESS VERTEBRA Performed at [facility], [address] [place], [place]  [address]    Special [id number]  Final    NONE Performed at [facility], [address] [place], [place] [address]    Gram Stain   Final    RARE WBC PRESENT, PREDOMINANTLY MONONUCLEAR NO ORGANISMS SEEN    Culture   Final    No growth aerobically or anaerobically. Performed at [facility], [address], [place], [place] [address]    Report Status 01/23/2021 FINAL  Final  Culture, blood (Routine X 2) w Reflex to ID Panel     Status: Abnormal   Collection Time: 01/18/21  5:08 PM   Specimen: BLOOD  Result Value Ref Range Status   Specimen Description   Final    BLOOD LEFT ANTECUBITAL Performed at [facility], [address], [place], [place] [address]    Special Requests   Final    BOTTLES DRAWN AEROBIC AND ANAEROBIC Blood Culture adequate volume Performed at [facility], [address] [place], [place] [address]    Culture  Setup Time   Final    GRAM POSITIVE COCCI IN CLUSTERS IN BOTH AEROBIC AND ANAEROBIC BOTTLES CRITICAL VALUE NOTED.  VALUE IS CONSISTENT WITH PREVIOUSLY REPORTED AND CALLED VALUE.    Culture (A)  Final    STAPHYLOCOCCUS AUREUS SUSCEPTIBILITIES PERFORMED ON PREVIOUS CULTURE WITHIN THE LAST 5 DAYS. Performed at [facility], [address] [place], [place] [address]     Report Status 01/21/2021 FINAL  Final  Culture, blood (Routine X 2) w Reflex to ID Panel     Status: Abnormal   Collection Time: 01/18/21  5:08 PM   Specimen: BLOOD RIGHT HAND  Result Value Ref Range Status   Specimen Description   Final    BLOOD RIGHT HAND Performed at [facility], [address] [place], [place] [address]    Special Requests   Final    BOTTLES DRAWN AEROBIC AND ANAEROBIC Blood Culture adequate volume Performed at [facility], [address] [place], [place] [address]    Culture  Setup Time   Final    GRAM POSITIVE COCCI IN CLUSTERS IN BOTH AEROBIC AND ANAEROBIC BOTTLES CRITICAL RESULT CALLED TO, READ BACK BY AND VERIFIED WITH: PHARMD MARY SWAYNE 01/19/2021  BY JW Performed at [facility], [address] [place], [place] [address]    Culture STAPHYLOCOCCUS AUREUS (A)  Final   Report Status 01/21/2021 FINAL  Final   Organism ID, Bacteria STAPHYLOCOCCUS AUREUS  Final      Susceptibility   Staphylococcus aureus - MIC*    CIPROFLOXACIN >=8 RESISTANT Resistant     ERYTHROMYCIN <=0.25 SENSITIVE Sensitive     GENTAMICIN <=0.5 SENSITIVE Sensitive     OXACILLIN <=0.25 SENSITIVE Sensitive     TETRACYCLINE <=1 SENSITIVE Sensitive     VANCOMYCIN <=0.5 SENSITIVE Sensitive     TRIMETH/SULFA <=10 SENSITIVE Sensitive     CLINDAMYCIN <=0.25 SENSITIVE Sensitive     RIFAMPIN <=0.5 SENSITIVE Sensitive     Inducible Clindamycin NEGATIVE Sensitive     * STAPHYLOCOCCUS AUREUS  Blood Culture ID Panel (Reflexed)     Status: Abnormal   Collection Time: 01/18/21  5:08 PM  Result Value Ref Range Status   Enterococcus faecalis NOT DETECTED NOT DETECTED Final   Enterococcus Faecium NOT DETECTED NOT DETECTED Final   Listeria monocytogenes NOT DETECTED NOT DETECTED Final   Staphylococcus species DETECTED (A) NOT DETECTED Final    Comment: CRITICAL RESULT  CALLED TO, READ BACK BY AND VERIFIED WITH: PHARMD MARY SWAYNE 01/19/2021 @1204   BY JW    Staphylococcus aureus (BCID) DETECTED (A) NOT DETECTED Final    Comment: CRITICAL RESULT CALLED TO, READ BACK BY AND VERIFIED WITH: PHARMD MARY SWAYNE 01/19/2021 @1204  BY JW    Staphylococcus epidermidis NOT DETECTED NOT DETECTED Final   Staphylococcus lugdunensis NOT DETECTED NOT DETECTED Final   Streptococcus species NOT DETECTED NOT DETECTED Final   Streptococcus agalactiae NOT DETECTED NOT DETECTED Final   Streptococcus pneumoniae NOT DETECTED NOT DETECTED Final   Streptococcus pyogenes NOT DETECTED NOT DETECTED Final   A.calcoaceticus-baumannii NOT DETECTED NOT DETECTED Final   Bacteroides fragilis NOT DETECTED NOT DETECTED Final   Enterobacterales NOT DETECTED NOT DETECTED Final   Enterobacter cloacae complex NOT DETECTED NOT DETECTED Final   Escherichia coli NOT DETECTED NOT DETECTED Final   Klebsiella aerogenes NOT DETECTED NOT DETECTED Final   Klebsiella oxytoca NOT DETECTED NOT DETECTED Final   Klebsiella pneumoniae NOT DETECTED NOT DETECTED Final   Proteus species NOT DETECTED NOT DETECTED Final   Salmonella species NOT DETECTED NOT DETECTED Final   Serratia marcescens NOT DETECTED NOT DETECTED Final   Haemophilus influenzae NOT DETECTED NOT DETECTED Final   Neisseria meningitidis NOT DETECTED NOT DETECTED Final   Pseudomonas aeruginosa NOT DETECTED NOT DETECTED Final   Stenotrophomonas maltophilia NOT DETECTED NOT DETECTED Final   Candida albicans NOT DETECTED NOT DETECTED Final   Candida auris NOT DETECTED NOT DETECTED Final   Candida glabrata NOT DETECTED NOT DETECTED Final   Candida krusei NOT DETECTED NOT DETECTED Final   Candida parapsilosis NOT DETECTED NOT DETECTED Final   Candida tropicalis NOT DETECTED NOT DETECTED Final   Cryptococcus neoformans/gattii NOT DETECTED NOT DETECTED Final   Meth resistant mecA/C and MREJ NOT DETECTED NOT DETECTED Final    Comment: Performed at [facility], [address] [place], [place] [address]  Culture, blood  (routine x 2)     Status: Abnormal   Collection Time: 01/20/21  4:26 AM   Specimen: BLOOD  Result Value Ref Range Status   Specimen Description   Final    BLOOD LEFT ANTECUBITAL Performed at [facility], [address] [place], [place] [address]    Special Requests   Final    BOTTLES DRAWN AEROBIC AND ANAEROBIC Blood Culture adequate volume Performed at [facility], [address] [place], [place] [address]    Culture  Setup Time   Final    GRAM POSITIVE COCCI IN CLUSTERS ANAEROBIC BOTTLE ONLY CRITICAL VALUE NOTED.  VALUE IS CONSISTENT WITH PREVIOUSLY REPORTED AND CALLED VALUE.    Culture (A)  Final    STAPHYLOCOCCUS AUREUS SUSCEPTIBILITIES PERFORMED ON PREVIOUS CULTURE WITHIN THE LAST 5 DAYS. Performed at [facility], [address] [place], [place] [address]    Report Status 01/23/2021 FINAL  Final  Culture, blood (routine x 2)     Status: Abnormal   Collection Time: 01/20/21  4:32 AM   Specimen: BLOOD RIGHT HAND  Result Value Ref Range Status   Specimen Description   Final    BLOOD RIGHT HAND Performed at [facility], [address] [place], [place] [address]    Special Requests   Final    BOTTLES DRAWN AEROBIC ONLY Blood Culture adequate volume Performed at [facility], [address] [place], [place] [address]    Culture  Setup Time   Final  GRAM POSITIVE COCCI IN CLUSTERS AEROBIC BOTTLE ONLY CRITICAL VALUE NOTED.  VALUE IS CONSISTENT WITH PREVIOUSLY REPORTED AND CALLED VALUE.    Culture (A)  Final    STAPHYLOCOCCUS AUREUS SUSCEPTIBILITIES PERFORMED ON PREVIOUS CULTURE WITHIN THE LAST 5 DAYS. Performed at [facility], [address] [place], [place] [address]    Report Status 01/24/2021 FINAL  Final  Culture, blood (Routine X 2) w Reflex to ID Panel     Status: None (Preliminary result)   Collection Time: 01/23/21 11:04 AM   Specimen: BLOOD  Result Value Ref  Range Status   Specimen Description   Final    BLOOD BLOOD LEFT HAND Performed at [facility], [address] [place], [place] [address]    Special Requests   Final    BOTTLES DRAWN AEROBIC ONLY Blood Culture results may not be optimal due to an inadequate volume of blood received in culture bottles Performed at [facility], [address] [place], [place] [address]    Culture   Final    NO GROWTH < 24 HOURS Performed at [facility], [address] [place], [place] [address]    Report Status PENDING  Incomplete  Culture, blood (Routine X 2) w Reflex to ID Panel     Status: None (Preliminary result)   Collection Time: 01/23/21 11:05 AM   Specimen: BLOOD  Result Value Ref Range Status   Specimen Description   Final    BLOOD LEFT ANTECUBITAL Performed at [facility], [address] [place], [place] [address]    Special Requests   Final    BOTTLES DRAWN AEROBIC AND ANAEROBIC Blood Culture results may not be optimal due to an excessive volume of blood received in culture bottles Performed at [facility], [address] [place],  [address]    Culture   Final    NO GROWTH < 24 HOURS Performed at [facility], [address] [place],  [address]    Report Status PENDING  Incomplete     Serology:   Imaging: If present, new imagings (plain films, ct scans, and mri) have been personally visualized and interpreted; radiology reports have been reviewed. Decision making incorporated into the Impression / Recommendations.  8/16 mri lumbar spine 1. Increased T2 signal about and disruption of the left L4-L5 facets, with increased T2 signal in posterior L5 vertebral body, left pedicle, and surrounding soft tissues. Edema and osseous destruction cause mild spinal canal narrowing. This could represent acute fracture or infection. Correlate with history of trauma and consider CT  for further osseous evaluation. 2. L2-L3 mild canal, moderate left neural foraminal narrowing and mild-to-moderate right neural foraminal narrowing.  8/18 tte  1. Left ventricular ejection fraction, by estimation, is 60 to 65%. The  left ventricle has normal function. The left ventricle has no regional  wall motion abnormalities. There is moderate asymmetric left ventricular  hypertrophy of the basal-septal  segment. Left ventricular diastolic parameters were normal.   2. Right ventricular systolic function is normal. The right ventricular  size is normal. There is normal pulmonary artery systolic pressure. The  estimated right ventricular systolic pressure is 27.9 mmHg.   3. The mitral valve is normal in structure. Trivial mitral valve  regurgitation. No evidence of mitral stenosis.   4. The aortic valve was not well visualized. Aortic valve regurgitation  is not visualized. No aortic stenosis is present.   5. The inferior   vena cava is normal in size with <50% respiratory  variability, suggesting right atrial pressure of 8 mmHg.   Conclusion(s)/Recommendation(s): No vegetation seen. If high clinical  suspicion for endocarditis, recommend TEE.   8/22 abdpelv ct with contrast 1. Progressive osteomyelitis at the left L4-5 facet joint, with evidence of ascending infection and probable epidural abscess and paraspinal phlegmon from L1 through L4. Follow-up MRI lumbar spine with and without contrast is recommended for more detailed evaluation. 2. Bilateral nonobstructing renal calculi. No evidence of obstructive uropathy within either kidney. 3. Indeterminate hypodensities right kidney, possibly hyperdense cysts, slightly increased since prior study. Follow-up renal MRI or CT could be performed if not previously evaluated. 4. Minimal gallbladder sludge.  No cholelithiasis or cholecystitis. 5.  Aortic Atherosclerosis  Raymondo Band, MD [facility] [phone] pager    01/24/2021, 3:50 PM

## 2021-01-24 NOTE — Progress Notes (Signed)
Physical Therapy Treatment Patient Details Name: Haley Singh MRN: [id number] DOB: 1951-04-12 Today's Date: 01/24/2021    History of Present Illness Pt is 70 y.o. female who presented to ED with sudden onset of severe back pain, septic arthritis suspected?Marland Kitchen PMH significant for chronic back pain, OA, HTN, HLD, asthma, depression, anxiety, insomnia, and R ankle ORIF (02/2007).    PT Comments    General Comments: AxO x 3 very willing to try but limited by fear of increasing any "spasms"  still in a lot pain. Pt was OOB in recliner with RN in room.  General transfer comment: from lower recliner level pt required + 2 side by side assist to push up to semi uright posture (limited by pain) onto B platform EVA walker.  Pt describes pain as "sharpe" and "gripping". General Gait Details: Great difficulty weightshifting or advancing either LE.  Started with pre gait marching in place with HEAVY lean on EVA walker.  Attempted forward steps was increasing difficult to clear floor (hip flex) and advance all due to increased pain from 4/10 at rest 10/10 with this activity.  Pt very frustrated and feeling of defeat.  Recliner closely following behind and slowly/control assisted to seated position.  Pt c/o increased "back spasms" which decreased with rest.  Pt was wearing her LSO which she states gives "some" comfort.  Left pi in recliner with B hips/knees flexed.  Follow Up Recommendations  SNF     Equipment Recommendations       Recommendations for Other Services       Precautions / Restrictions Precautions Precautions: Fall Precaution Comments: LSO Back brace ?  (? brace for pain/comfort) no specific instructions on use Restrictions Weight Bearing Restrictions: No    Mobility  Bed Mobility               General bed mobility comments: OOB in recliner    Transfers Overall transfer level: Needs assistance Equipment used: Rolling walker (2 wheeled) Transfers: Sit to/from Stand Sit to  Stand: +2 physical assistance;Mod assist         General transfer comment: from lower recliner level pt required + 2 side by side assist to push up to semi uright posture (limited by pain) onto B platform EVA walker.  Pt describes pain as "sharpe" and "gripping".  Ambulation/Gait Ambulation/Gait assistance: Max assist;+2 physical assistance;+2 safety/equipment Gait Distance (Feet): 1 Feet Assistive device: Bilateral platform walker (EVA walker) Gait Pattern/deviations: Step-to pattern Gait velocity: decreased   General Gait Details: Great difficulty weightshifting or advancing either LE.  Started with pre gait marching in place with HEAVY lean on EVA walker.  Attempted forward steps was increasing difficult to clear floor (hip flex) and advance all due to increased pain from 4/10 at rest 10/10 with this activity.  Pt very frustrated and feeling of defeat.  Recliner closely following behind and slowly/control assisted to seated position.  Pt c/o increased "back spasms" which decreased with rest.  Pt was wearing her LSO which she states gives "some" comfort.  Left pi in recliner with B hips/knees flexed.   Stairs             Wheelchair Mobility    Modified Rankin (Stroke Patients Only)       Balance  Cognition Arousal/Alertness: Awake/alert Behavior During Therapy: WFL for tasks assessed/performed Overall Cognitive Status: Within Functional Limits for tasks assessed                                 General Comments: AxO x 3 very willing to try but limited by fear of increasing any "spasms"  still in a lot pain.      Exercises      General Comments        Pertinent Vitals/Pain Pain Assessment: 0-10 Pain Score: 8  Pain Location: back pain with activity/mvmt (pre medicated) Pain Descriptors / Indicators: Sharp;Grimacing Pain Intervention(s): Monitored during session;Premedicated before  session;Repositioned    Home Living                      Prior Function            PT Goals (current goals can now be found in the care plan section) Progress towards PT goals: Progressing toward goals    Frequency    Min 2X/week      PT Plan Current plan remains appropriate    Co-evaluation              AM-PAC PT "6 Clicks" Mobility   Outcome Measure  Help needed turning from your back to your side while in a flat bed without using bedrails?: A Lot Help needed moving from lying on your back to sitting on the side of a flat bed without using bedrails?: A Lot Help needed moving to and from a bed to a chair (including a wheelchair)?: A Lot Help needed standing up from a chair using your arms (e.g., wheelchair or bedside chair)?: A Lot Help needed to walk in hospital room?: Total Help needed climbing 3-5 steps with a railing? : Total 6 Click Score: 10    End of Session Equipment Utilized During Treatment: Gait belt;Back brace Activity Tolerance: Patient limited by pain Patient left: in chair;with call bell/phone within reach;with chair alarm set;Other (comment) Nurse Communication: Mobility status PT Visit Diagnosis: Unsteadiness on feet (R26.81);Difficulty in walking, not elsewhere classified (R26.2);Pain Pain - Right/Left: Right     Time: 0902-0927 PT Time Calculation (min) (ACUTE ONLY): 25 min  Charges:  $Gait Training: 8-22 mins $Therapeutic Activity: 8-22 mins                    Felecia Shelling  PTA Acute  Rehabilitation Services Pager      [phone] Office      [phone]

## 2021-01-24 NOTE — Progress Notes (Signed)
    [facility] has been requested to perform a transesophageal echocardiogram on Haley Singh for bacteremia.  After careful review of history and examination, the risks and benefits of transesophageal echocardiogram have been explained including risks of esophageal damage, perforation (1:10,000 risk), bleeding, pharyngeal hematoma as well as other potential complications associated with conscious sedation including aspiration, arrhythmia, respiratory failure and death. Alternatives to treatment were discussed, questions were answered. Patient is willing to proceed.   Pt is scheduled for TEE tomorrow at 10:00AM with Dr. Cristal Deer. NPO at MN please.  Roe Rutherford Henlee Donovan, [place]  01/24/2021 5:27 PM

## 2021-01-24 NOTE — Progress Notes (Signed)
Occupational Therapy Treatment Patient Details Name: Haley Singh MRN: [id number] DOB: 02-22-1951 Today's Date: 01/24/2021    History of present illness Pt is 70 y.o. female who presented to ED with sudden onset of severe back pain, septic arthritis suspected?Marland Kitchen PMH significant for chronic back pain, OA, HTN, HLD, asthma, depression, anxiety, insomnia, and R ankle ORIF (02/2007).   OT comments  Patient was educated on LB dressing with AE with patient able to demonstrate understanding for donning/doffing socks. Patient unable to participate in weight shifting to simulate donning/doffing pants secondary to pain. Patient's discharge plan remains appropriate at this time. OT will continue to follow acutely.    Follow Up Recommendations  SNF    Equipment Recommendations       Recommendations for Other Services      Precautions / Restrictions Precautions Precautions: Fall Precaution Comments: LSO Back brace ?  (? brace for pain/comfort) no specific instructions on use Restrictions Weight Bearing Restrictions: No       Mobility Bed Mobility               General bed mobility comments: OOB in recliner    Transfers Overall transfer level: Needs assistance Equipment used: Rolling walker (2 wheeled) Transfers: Sit to/from Stand Sit to Stand: +2 physical assistance;Mod assist         General transfer comment: from lower recliner level pt required + 2 side by side assist to push up to semi uright posture (limited by pain) onto B platform EVA walker.  Pt describes pain as "sharpe" and "gripping".    Balance                                           ADL either performed or assessed with clinical judgement   ADL                       Lower Body Dressing: With adaptive equipment;Sitting/lateral leans;Maximal assistance Lower Body Dressing Details (indicate cue type and reason): patient was supervision for donning bilateral socks with sock aid and  doffing socks with reacher with education on proper use of each item. patient was unable to participate in weight shifting of hips to simulate LB dressing. patient was educated on AE and where to purchase. patient verbalized understanding.               General ADL Comments: patient was seated in recliner during this session with patient reporting having had a good day today.     Vision Patient Visual Report: No change from baseline     Perception     Praxis      Cognition Arousal/Alertness: Awake/alert Behavior During Therapy: WFL for tasks assessed/performed Overall Cognitive Status: Within Functional Limits for tasks assessed                                 General Comments: AxO x 3 very willing to try but limited by fear of increasing any "spasms"  still in a lot pain.        Exercises     Shoulder Instructions       General Comments      Pertinent Vitals/ Pain       Pain Assessment: Faces Pain Score: 8  Faces Pain Scale: Hurts little more Pain Location: back  pain Pain Descriptors / Indicators: Sharp;Grimacing Pain Intervention(s): Limited activity within patient's tolerance;Monitored during session  Home Living                                          Prior Functioning/Environment              Frequency  Min 2X/week        Progress Toward Goals  OT Goals(current goals can now be found in the care plan section)  Progress towards OT goals: Progressing toward goals  Acute Rehab OT Goals Patient Stated Goal: have less pain  Plan      Co-evaluation                 AM-PAC OT "6 Clicks" Daily Activity     Outcome Measure   Help from another person eating meals?: None Help from another person taking care of personal grooming?: A Little Help from another person toileting, which includes using toliet, bedpan, or urinal?: Total Help from another person bathing (including washing, rinsing, drying)?:  Total Help from another person to put on and taking off regular upper body clothing?: A Lot Help from another person to put on and taking off regular lower body clothing?: A Lot (with AE) 6 Click Score: 13    End of Session Equipment Utilized During Treatment: Other (comment) (reacher and sock aid)  OT Visit Diagnosis: Unsteadiness on feet (R26.81);Muscle weakness (generalized) (M62.81);Pain Pain - part of body:  (back)   Activity Tolerance     Patient Left in chair;with call bell/phone within reach   Nurse Communication          Time: 7253-6644 OT Time Calculation (min): 26 min  Charges: OT General Charges $OT Visit: 1 Visit OT Treatments $Self Care/Home Management : 23-37 mins  Sharyn Blitz OTR/L, MS Acute Rehabilitation Department Office# [phone] Pager# [phone]    [facility] Guest Nicholes Hibler 01/24/2021, 4:03 PM

## 2021-01-24 NOTE — Progress Notes (Signed)
I have reviewed the patient's history in EMR and personally reviewed the CT and MRI L-spine. Pt admitted with back pain and found to have MSSA bacteremia. Imaging reveals septic arthritis of the left L4-5 joint in the setting of significant underlying degenerative spondylosis. I do not see any epidural abscess or compression of infectious etiology to warrant operative intervention. Would continue to treat medically. Could certainly discuss options for treatment of mechanical back pain in the outpatient setting if she has continued pain after her infection has been adequately treated.   Lisbeth Renshaw, MD [facility]

## 2021-01-24 NOTE — Hospital Course (Signed)
IMPRESSION: 1. Progressive osteomyelitis at the left L4-5 facet joint, with evidence of ascending infection and probable epidural abscess and paraspinal phlegmon from L1 through L4. Follow-up MRI lumbar spine with and without contrast is recommended for more detailed evaluation. 2. Bilateral nonobstructing renal calculi. No evidence of obstructive uropathy within either kidney. 3. Indeterminate hypodensities right kidney, possibly hyperdense cysts, slightly increased since prior study. Follow-up renal MRI or CT could be performed if not previously evaluated. 4. Minimal gallbladder sludge.  No cholelithiasis or cholecystitis. 5.  Aortic Atherosclerosis (ICD10-I70.0).   Critical Value/emergent results were called by telephone at the time of interpretation on 01/23/2021 at 4:28 pm to provider [facility] , who verbally acknowledged these results.

## 2021-01-24 NOTE — Progress Notes (Signed)
[facility]  Date of Admission:  01/17/2021     CC: Mssa bacteremia Lumbar spine osteomyelitis  Lines:  Peripheral iv's   Abx: 8/18-c cefazolin   8/16-18 vanc                                                        Assessment: 70 yo female lumbago, OA, htn/hlp, asthma, admitted 8/16 with progressive 7 day acute on chronic lower back pain, found to have mri imaging L4-5 vertebral om/discitis and mssa bacteremia   8/17 bcx mssa 8/17 IR aspirate (left L4-5 facet joint) cx in progress 8/19 bcx staph aureus   Source usually unclear on these patients. But so far no obvious metastatic site of infection. Presumably this is hematogenous spread of mssa bacteremia.   8/19 assessment 8/18 tte no obvious valve vegetation Repeat bcx in process Afebrile; hds No other obvious source of metastatic involvement  If repeat bcx continues to be positive despite appropriate abx, would get TEE (if tte negative) and pan body ct  -------- 8/23 assessment Ct of abd/pelv showed worsening lumbar spine epidural abscess. Clinically no LE neurological deficit or bladder/bowel incontinence. Nsg reviewed cases and deemed no surgical intervention needed at this time Patient doing pt/ot Repeat bcx 8/22 ngtd  Back pain stable moderate   Plan: Continue cefazolin iv Await tee hopefully can be done tomorrow Final abx recommendation pending TEE Anticipate 6 weeks iv abx and then at least 4 more weeks PO abx given abscess in epidural space Discussed with primary team     Active Problems:   Osteomyelitis (HCC)   Discitis of lumbar region   MSSA bacteremia   Allergies  Allergen Reactions   Oxycontin [Oxycodone] Itching   Penicillins Other (See Comments)    unk Did it involve swelling of the face/tongue/throat, SOB, or low BP? Unknown Did it involve sudden or severe rash/hives, skin peeling, or any reaction on the inside of your mouth or nose? Unknown Did  you need to seek medical attention at a hospital or doctor's office? Unknown When did it last happen?     over 10 years ago  If all above answers are "NO", may proceed with cephalosporin use.    Tetracyclines & Related Itching    Scheduled Meds:  (feeding supplement) PROSource Plus  30 mL Oral BID BM   amLODipine  5 mg Oral Daily   atorvastatin  40 mg Oral Daily   enoxaparin (LOVENOX) injection  40 mg Subcutaneous Q24H   fluticasone  1 spray Each Nare BID   fluticasone furoate-vilanterol  1 puff Inhalation Daily   gabapentin  600 mg Oral QHS   lidocaine  1 patch Transdermal Q24H   loratadine  10 mg Oral Daily   losartan  50 mg Oral Daily   montelukast  10 mg Oral QHS   oxyCODONE  15 mg Oral Q12H   polyethylene glycol  17 g Oral Daily   potassium chloride  40 mEq Oral BID   Ensure Max Protein  11 oz Oral Daily   senna-docusate  1 tablet Oral BID   traZODone  150 mg Oral QHS   venlafaxine XR  150 mg Oral q morning   Continuous Infusions:  sodium chloride 250 mL (01/21/21 1814)    ceFAZolin (  ANCEF) IV 2 g (01/24/21 1400)   methocarbamol (ROBAXIN) IV 500 mg (01/22/21 0249)   PRN Meds:.sodium chloride, acetaminophen **OR** acetaminophen, albuterol, bisacodyl, celecoxib, methocarbamol (ROBAXIN) IV, ondansetron **OR** ondansetron (ZOFRAN) IV, oxyCODONE-acetaminophen, SUMAtriptan   SUBJECTIVE: Stable back pain, controlled; doing pt/ot Afebrile Ct scan discussed No n/v constipated  Review of Systems: ROS All other ROS was negative, except mentioned above     OBJECTIVE: Vitals:   01/23/21 1041 01/23/21 2211 01/24/21 0545 01/24/21 1348  BP: (!) 150/69 (!) 152/84 (!) 143/79 125/71  Pulse: 93 81 86 88  Resp:  17 17 18   Temp:  97.9 F (36.6 C) 98.5 F (36.9 C) 98.4 F (36.9 C)  TempSrc:  Oral Oral Oral  SpO2: 94% 93% 93% 97%  Weight:      Height:       Body mass index is 32.28 kg/m.  Physical Exam General/constitutional: no distress, pleasant HEENT:  Normocephalic, PER, Conj Clear, EOMI, Oropharynx clear Neck supple CV: rrr no mrg Lungs: clear to auscultation, normal respiratory effort Abd: Soft, Nontender Ext: no edema Skin: No Rash Neuro: nonfocal MSK: mild lumbar spine tenderness   Central line presence: no    Lab Results Lab Results  Component Value Date   WBC 9.3 01/24/2021   HGB 9.6 (L) 01/24/2021   HCT 31.4 (L) 01/24/2021   MCV 90.8 01/24/2021   PLT 299 01/24/2021    Lab Results  Component Value Date   CREATININE 0.57 01/24/2021   BUN 10 01/24/2021   NA 137 01/24/2021   K 3.0 (L) 01/24/2021   CL 98 01/24/2021   CO2 29 01/24/2021    Lab Results  Component Value Date   ALT 13 01/24/2021   AST 22 01/24/2021   ALKPHOS 92 01/24/2021   BILITOT 0.3 01/24/2021      Microbiology: Recent Results (from the past 240 hour(s))  SARS CORONAVIRUS 2 (TAT 6-24 HRS) Nasopharyngeal Nasopharyngeal Swab     Status: None   Collection Time: 01/17/21  5:42 PM   Specimen: Nasopharyngeal Swab  Result Value Ref Range Status   SARS Coronavirus 2 NEGATIVE NEGATIVE Final    Comment: (NOTE) SARS-CoV-2 target nucleic acids are NOT DETECTED.  The SARS-CoV-2 RNA is generally detectable in upper and lower respiratory specimens during the acute phase of infection. Negative results do not preclude SARS-CoV-2 infection, do not rule out co-infections with other pathogens, and should not be used as the sole basis for treatment or other patient management decisions. Negative results must be combined with clinical observations, patient history, and epidemiological information. The expected result is Negative.  Fact Sheet for Patients: 01/19/21  Fact Sheet for Healthcare Providers: HairSlick.no  This test is not yet approved or cleared by the quierodirigir.com FDA and  has been authorized for detection and/or diagnosis of SARS-CoV-2 by FDA under an Emergency Use  Authorization (EUA). This EUA will remain  in effect (meaning this test can be used) for the duration of the COVID-19 declaration under Se ction 564(b)(1) of the Act, 21 U.S.C. section 360bbb-3(b)(1), unless the authorization is terminated or revoked sooner.  Performed at [facility], [address], [place], [place]   Aerobic/Anaerobic Culture w Gram Stain (surgical/deep wound)     Status: None   Collection Time: 01/18/21  2:52 PM   Specimen: Abscess  Result Value Ref Range Status   Specimen Description   Final    ABSCESS VERTEBRA Performed at [facility], [address] [place], [place]  [address]    Special [id number]  Final    NONE Performed at [facility], [address] [place], [place] [address]    Gram Stain   Final    RARE WBC PRESENT, PREDOMINANTLY MONONUCLEAR NO ORGANISMS SEEN    Culture   Final    No growth aerobically or anaerobically. Performed at [facility], [address], [place], [place] [address]    Report Status 01/23/2021 FINAL  Final  Culture, blood (Routine X 2) w Reflex to ID Panel     Status: Abnormal   Collection Time: 01/18/21  5:08 PM   Specimen: BLOOD  Result Value Ref Range Status   Specimen Description   Final    BLOOD LEFT ANTECUBITAL Performed at [facility], [address], [place], [place] [address]    Special Requests   Final    BOTTLES DRAWN AEROBIC AND ANAEROBIC Blood Culture adequate volume Performed at [facility], [address] [place], [place] [address]    Culture  Setup Time   Final    GRAM POSITIVE COCCI IN CLUSTERS IN BOTH AEROBIC AND ANAEROBIC BOTTLES CRITICAL VALUE NOTED.  VALUE IS CONSISTENT WITH PREVIOUSLY REPORTED AND CALLED VALUE.    Culture (A)  Final    STAPHYLOCOCCUS AUREUS SUSCEPTIBILITIES PERFORMED ON PREVIOUS CULTURE WITHIN THE LAST 5 DAYS. Performed at [facility], [address] [place], [place] [address]     Report Status 01/21/2021 FINAL  Final  Culture, blood (Routine X 2) w Reflex to ID Panel     Status: Abnormal   Collection Time: 01/18/21  5:08 PM   Specimen: BLOOD RIGHT HAND  Result Value Ref Range Status   Specimen Description   Final    BLOOD RIGHT HAND Performed at [facility], [address] [place], [place] [address]    Special Requests   Final    BOTTLES DRAWN AEROBIC AND ANAEROBIC Blood Culture adequate volume Performed at [facility], [address] [place], [place] [address]    Culture  Setup Time   Final    GRAM POSITIVE COCCI IN CLUSTERS IN BOTH AEROBIC AND ANAEROBIC BOTTLES CRITICAL RESULT CALLED TO, READ BACK BY AND VERIFIED WITH: PHARMD MARY SWAYNE 01/19/2021  BY JW Performed at [facility], [address] [place], [place] [address]    Culture STAPHYLOCOCCUS AUREUS (A)  Final   Report Status 01/21/2021 FINAL  Final   Organism ID, Bacteria STAPHYLOCOCCUS AUREUS  Final      Susceptibility   Staphylococcus aureus - MIC*    CIPROFLOXACIN >=8 RESISTANT Resistant     ERYTHROMYCIN <=0.25 SENSITIVE Sensitive     GENTAMICIN <=0.5 SENSITIVE Sensitive     OXACILLIN <=0.25 SENSITIVE Sensitive     TETRACYCLINE <=1 SENSITIVE Sensitive     VANCOMYCIN <=0.5 SENSITIVE Sensitive     TRIMETH/SULFA <=10 SENSITIVE Sensitive     CLINDAMYCIN <=0.25 SENSITIVE Sensitive     RIFAMPIN <=0.5 SENSITIVE Sensitive     Inducible Clindamycin NEGATIVE Sensitive     * STAPHYLOCOCCUS AUREUS  Blood Culture ID Panel (Reflexed)     Status: Abnormal   Collection Time: 01/18/21  5:08 PM  Result Value Ref Range Status   Enterococcus faecalis NOT DETECTED NOT DETECTED Final   Enterococcus Faecium NOT DETECTED NOT DETECTED Final   Listeria monocytogenes NOT DETECTED NOT DETECTED Final   Staphylococcus species DETECTED (A) NOT DETECTED Final    Comment: CRITICAL RESULT  CALLED TO, READ BACK BY AND VERIFIED WITH: PHARMD MARY SWAYNE 01/19/2021 @1204   BY JW    Staphylococcus aureus (BCID) DETECTED (A) NOT DETECTED Final    Comment: CRITICAL RESULT CALLED TO, READ BACK BY AND VERIFIED WITH: PHARMD MARY SWAYNE 01/19/2021 @1204  BY JW    Staphylococcus epidermidis NOT DETECTED NOT DETECTED Final   Staphylococcus lugdunensis NOT DETECTED NOT DETECTED Final   Streptococcus species NOT DETECTED NOT DETECTED Final   Streptococcus agalactiae NOT DETECTED NOT DETECTED Final   Streptococcus pneumoniae NOT DETECTED NOT DETECTED Final   Streptococcus pyogenes NOT DETECTED NOT DETECTED Final   A.calcoaceticus-baumannii NOT DETECTED NOT DETECTED Final   Bacteroides fragilis NOT DETECTED NOT DETECTED Final   Enterobacterales NOT DETECTED NOT DETECTED Final   Enterobacter cloacae complex NOT DETECTED NOT DETECTED Final   Escherichia coli NOT DETECTED NOT DETECTED Final   Klebsiella aerogenes NOT DETECTED NOT DETECTED Final   Klebsiella oxytoca NOT DETECTED NOT DETECTED Final   Klebsiella pneumoniae NOT DETECTED NOT DETECTED Final   Proteus species NOT DETECTED NOT DETECTED Final   Salmonella species NOT DETECTED NOT DETECTED Final   Serratia marcescens NOT DETECTED NOT DETECTED Final   Haemophilus influenzae NOT DETECTED NOT DETECTED Final   Neisseria meningitidis NOT DETECTED NOT DETECTED Final   Pseudomonas aeruginosa NOT DETECTED NOT DETECTED Final   Stenotrophomonas maltophilia NOT DETECTED NOT DETECTED Final   Candida albicans NOT DETECTED NOT DETECTED Final   Candida auris NOT DETECTED NOT DETECTED Final   Candida glabrata NOT DETECTED NOT DETECTED Final   Candida krusei NOT DETECTED NOT DETECTED Final   Candida parapsilosis NOT DETECTED NOT DETECTED Final   Candida tropicalis NOT DETECTED NOT DETECTED Final   Cryptococcus neoformans/gattii NOT DETECTED NOT DETECTED Final   Meth resistant mecA/C and MREJ NOT DETECTED NOT DETECTED Final    Comment: Performed at [facility], [address] [place], [place] [address]  Culture, blood  (routine x 2)     Status: Abnormal   Collection Time: 01/20/21  4:26 AM   Specimen: BLOOD  Result Value Ref Range Status   Specimen Description   Final    BLOOD LEFT ANTECUBITAL Performed at [facility], [address] [place], [place] [address]    Special Requests   Final    BOTTLES DRAWN AEROBIC AND ANAEROBIC Blood Culture adequate volume Performed at [facility], [address] [place], [place] [address]    Culture  Setup Time   Final    GRAM POSITIVE COCCI IN CLUSTERS ANAEROBIC BOTTLE ONLY CRITICAL VALUE NOTED.  VALUE IS CONSISTENT WITH PREVIOUSLY REPORTED AND CALLED VALUE.    Culture (A)  Final    STAPHYLOCOCCUS AUREUS SUSCEPTIBILITIES PERFORMED ON PREVIOUS CULTURE WITHIN THE LAST 5 DAYS. Performed at [facility], [address] [place], [place] [address]    Report Status 01/23/2021 FINAL  Final  Culture, blood (routine x 2)     Status: Abnormal   Collection Time: 01/20/21  4:32 AM   Specimen: BLOOD RIGHT HAND  Result Value Ref Range Status   Specimen Description   Final    BLOOD RIGHT HAND Performed at [facility], [address] [place], [place] [address]    Special Requests   Final    BOTTLES DRAWN AEROBIC ONLY Blood Culture adequate volume Performed at [facility], [address] [place], [place] [address]    Culture  Setup Time   Final  GRAM POSITIVE COCCI IN CLUSTERS AEROBIC BOTTLE ONLY CRITICAL VALUE NOTED.  VALUE IS CONSISTENT WITH PREVIOUSLY REPORTED AND CALLED VALUE.    Culture (A)  Final    STAPHYLOCOCCUS AUREUS SUSCEPTIBILITIES PERFORMED ON PREVIOUS CULTURE WITHIN THE LAST 5 DAYS. Performed at [facility], [address], [place], [place] [address]    Report Status 01/24/2021 FINAL  Final  Culture, blood (Routine X 2) w Reflex to ID Panel     Status: None (Preliminary result)   Collection Time: 01/23/21 11:04 AM   Specimen: BLOOD  Result Value Ref  Range Status   Specimen Description   Final    BLOOD BLOOD LEFT HAND Performed at [facility], [address] [place], [place] [address]    Special Requests   Final    BOTTLES DRAWN AEROBIC ONLY Blood Culture results may not be optimal due to an inadequate volume of blood received in culture bottles Performed at [facility], [address] [place], [place] [address]    Culture   Final    NO GROWTH < 24 HOURS Performed at [facility], [address] [place], [place] [address]    Report Status PENDING  Incomplete  Culture, blood (Routine X 2) w Reflex to ID Panel     Status: None (Preliminary result)   Collection Time: 01/23/21 11:05 AM   Specimen: BLOOD  Result Value Ref Range Status   Specimen Description   Final    BLOOD LEFT ANTECUBITAL Performed at [facility], [address] [place], [place] [address]    Special Requests   Final    BOTTLES DRAWN AEROBIC AND ANAEROBIC Blood Culture results may not be optimal due to an excessive volume of blood received in culture bottles Performed at [facility], [address] [place], [place] [address]    Culture   Final    NO GROWTH < 24 HOURS Performed at [facility], [address] [place], [place] [address]    Report Status PENDING  Incomplete     Serology:   Imaging: If present, new imagings (plain films, ct scans, and mri) have been personally visualized and interpreted; radiology reports have been reviewed. Decision making incorporated into the Impression / Recommendations.  8/16 mri lumbar spine 1. Increased T2 signal about and disruption of the left L4-L5 facets, with increased T2 signal in posterior L5 vertebral body, left pedicle, and surrounding soft tissues. Edema and osseous destruction cause mild spinal canal narrowing. This could represent acute fracture or infection. Correlate with history of trauma and consider CT  for further osseous evaluation. 2. L2-L3 mild canal, moderate left neural foraminal narrowing and mild-to-moderate right neural foraminal narrowing.  8/18 tte  1. Left ventricular ejection fraction, by estimation, is 60 to 65%. The  left ventricle has normal function. The left ventricle has no regional  wall motion abnormalities. There is moderate asymmetric left ventricular  hypertrophy of the basal-septal  segment. Left ventricular diastolic parameters were normal.   2. Right ventricular systolic function is normal. The right ventricular  size is normal. There is normal pulmonary artery systolic pressure. The  estimated right ventricular systolic pressure is 27.9 mmHg.   3. The mitral valve is normal in structure. Trivial mitral valve  regurgitation. No evidence of mitral stenosis.   4. The aortic valve was not well visualized. Aortic valve regurgitation  is not visualized. No aortic stenosis is present.   5. The inferior  vena cava is normal in size with <50% respiratory  variability, suggesting right atrial pressure of 8 mmHg.   Conclusion(s)/Recommendation(s): No vegetation seen. If high clinical  suspicion for endocarditis, recommend TEE.   8/22 abdpelv ct with contrast 1. Progressive osteomyelitis at the left L4-5 facet joint, with evidence of ascending infection and probable epidural abscess and paraspinal phlegmon from L1 through L4. Follow-up MRI lumbar spine with and without contrast is recommended for more detailed evaluation. 2. Bilateral nonobstructing renal calculi. No evidence of obstructive uropathy within either kidney. 3. Indeterminate hypodensities right kidney, possibly hyperdense cysts, slightly increased since prior study. Follow-up renal MRI or CT could be performed if not previously evaluated. 4. Minimal gallbladder sludge.  No cholelithiasis or cholecystitis. 5.  Aortic Atherosclerosis  Raymondo Band, MD [facility] [phone] pager    01/24/2021, 3:50 PM

## 2021-01-25 ENCOUNTER — Encounter (HOSPITAL_COMMUNITY): Payer: Self-pay | Admitting: Internal Medicine

## 2021-01-25 ENCOUNTER — Inpatient Hospital Stay (HOSPITAL_COMMUNITY): Payer: Medicare PPO | Admitting: Certified Registered"

## 2021-01-25 ENCOUNTER — Inpatient Hospital Stay (HOSPITAL_COMMUNITY): Payer: Medicare PPO

## 2021-01-25 ENCOUNTER — Inpatient Hospital Stay (HOSPITAL_COMMUNITY): Admit: 2021-01-25 | Payer: Medicare PPO

## 2021-01-25 ENCOUNTER — Encounter (HOSPITAL_COMMUNITY)
Admission: EM | Disposition: A | Discharge status: Skilled Nursing Facility | Payer: Self-pay | Source: Home / Self Care | Attending: Family Medicine

## 2021-01-25 DIAGNOSIS — M4646 Discitis, unspecified, lumbar region: Secondary | ICD-10-CM | POA: Diagnosis not present

## 2021-01-25 DIAGNOSIS — M868X8 Other osteomyelitis, other site: Secondary | ICD-10-CM | POA: Diagnosis not present

## 2021-01-25 DIAGNOSIS — R7881 Bacteremia: Secondary | ICD-10-CM | POA: Diagnosis not present

## 2021-01-25 DIAGNOSIS — Q211 Atrial septal defect: Secondary | ICD-10-CM

## 2021-01-25 DIAGNOSIS — B9561 Methicillin susceptible Staphylococcus aureus infection as the cause of diseases classified elsewhere: Secondary | ICD-10-CM | POA: Diagnosis not present

## 2021-01-25 HISTORY — PX: BUBBLE STUDY: SHX6837

## 2021-01-25 HISTORY — PX: TEE WITHOUT CARDIOVERSION: SHX5443

## 2021-01-25 LAB — CBC WITH DIFFERENTIAL/PLATELET
Abs Immature Granulocytes: 0.63 10*3/uL — ABNORMAL HIGH (ref 0.00–0.07)
Basophils Absolute: 0.1 10*3/uL (ref 0.0–0.1)
Basophils Relative: 1 %
Eosinophils Absolute: 0 10*3/uL (ref 0.0–0.5)
Eosinophils Relative: 0 %
HCT: 30.2 % — ABNORMAL LOW (ref 36.0–46.0)
Hemoglobin: 9.4 g/dL — ABNORMAL LOW (ref 12.0–15.0)
Immature Granulocytes: 7 %
Lymphocytes Relative: 16 %
Lymphs Abs: 1.5 10*3/uL (ref 0.7–4.0)
MCH: 28.6 pg (ref 26.0–34.0)
MCHC: 31.1 g/dL (ref 30.0–36.0)
MCV: 91.8 fL (ref 80.0–100.0)
Monocytes Absolute: 0.8 10*3/uL (ref 0.1–1.0)
Monocytes Relative: 8 %
Neutro Abs: 6.8 10*3/uL (ref 1.7–7.7)
Neutrophils Relative %: 68 %
Platelets: 290 10*3/uL (ref 150–400)
RBC: 3.29 MIL/uL — ABNORMAL LOW (ref 3.87–5.11)
RDW: 14.5 % (ref 11.5–15.5)
WBC: 9.7 10*3/uL (ref 4.0–10.5)
nRBC: 0.2 % (ref 0.0–0.2)

## 2021-01-25 LAB — BASIC METABOLIC PANEL
Anion gap: 8 (ref 5–15)
BUN: 12 mg/dL (ref 8–23)
CO2: 29 mmol/L (ref 22–32)
Calcium: 8.3 mg/dL — ABNORMAL LOW (ref 8.9–10.3)
Chloride: 100 mmol/L (ref 98–111)
Creatinine, Ser: 0.64 mg/dL (ref 0.44–1.00)
GFR, Estimated: >60 mL/min (ref >60)
Glucose, Bld: 92 mg/dL (ref 70–99)
Potassium: 3.8 mmol/L (ref 3.5–5.1)
Sodium: 137 mmol/L (ref 135–145)

## 2021-01-25 LAB — COMPREHENSIVE METABOLIC PANEL
ALT: 19 U/L (ref 0–44)
AST: 36 U/L (ref 15–41)
Albumin: 2.2 g/dL — ABNORMAL LOW (ref 3.5–5.0)
Alkaline Phosphatase: 91 U/L (ref 38–126)
Anion gap: 8 (ref 5–15)
BUN: 12 mg/dL (ref 8–23)
CO2: 29 mmol/L (ref 22–32)
Calcium: 8.4 mg/dL — ABNORMAL LOW (ref 8.9–10.3)
Chloride: 100 mmol/L (ref 98–111)
Creatinine, Ser: 0.66 mg/dL (ref 0.44–1.00)
GFR, Estimated: >60 mL/min (ref >60)
Glucose, Bld: 85 mg/dL (ref 70–99)
Potassium: 4 mmol/L (ref 3.5–5.1)
Sodium: 137 mmol/L (ref 135–145)
Total Bilirubin: 0.6 mg/dL (ref 0.3–1.2)
Total Protein: 7.3 g/dL (ref 6.5–8.1)

## 2021-01-25 LAB — MAGNESIUM: Magnesium: 2.1 mg/dL (ref 1.7–2.4)

## 2021-01-25 LAB — C-REACTIVE PROTEIN: CRP: 14.8 mg/dL — ABNORMAL HIGH (ref <1.0)

## 2021-01-25 SURGERY — ECHOCARDIOGRAM, TRANSESOPHAGEAL
Anesthesia: Monitor Anesthesia Care

## 2021-01-25 MED ORDER — PROPOFOL 500 MG/50ML IV EMUL
INTRAVENOUS | Status: DC | PRN
  Administered 2021-01-25: 150 ug/kg/min via INTRAVENOUS

## 2021-01-25 MED ORDER — PROPOFOL 10 MG/ML IV BOLUS
INTRAVENOUS | Status: DC | PRN
Start: 2021-01-25 — End: 2021-01-25
  Administered 2021-01-25 (×2): 20 mg via INTRAVENOUS

## 2021-01-25 MED ORDER — SODIUM CHLORIDE 0.9 % IV SOLN: INTRAVENOUS | Status: DC

## 2021-01-25 MED ORDER — BUTAMBEN-TETRACAINE-BENZOCAINE 2-2-14 % EX AERO
INHALATION_SPRAY | CUTANEOUS | Status: DC | PRN
  Administered 2021-01-25: 2 via TOPICAL

## 2021-01-25 MED ORDER — SODIUM CHLORIDE 0.9 % IV SOLN: INTRAVENOUS | Status: DC | PRN

## 2021-01-25 MED ORDER — PHENYLEPHRINE HCL (PRESSORS) 10 MG/ML IV SOLN
INTRAVENOUS | Status: DC | PRN
  Administered 2021-01-25 (×2): 80 ug via INTRAVENOUS

## 2021-01-25 NOTE — Progress Notes (Signed)
Patient transported via carelink back to [facility] from [facility]. Report given to carelink and her floor nurse.

## 2021-01-25 NOTE — TOC Progression Note (Signed)
Transition of Care [facility]) - Progression Note    Patient Details  Name: Haley Singh MRN: [id number] Date of Birth: 01-16-1951  Transition of Care [facility]) CM/SW Contact  Lennart Pall, LCSW Phone Number: 01/25/2021, 1:33 PM  Clinical Narrative:    Met briefly with pt and discussed TOC continues to follow to assist with dc (plan remains for SNF).  Awaiting medical clearance.   Expected Discharge Plan: [place] Barriers to Discharge: Continued Medical Work up  Expected Discharge Plan and Services Expected Discharge Plan: [place] In-house Referral: Clinical Social Work Discharge Planning Services: CM Consult Post Acute Care Choice: [place] Living arrangements for the past 2 months: Single Family Home                                       Social Determinants of Health (SDOH) Interventions    Readmission Risk Interventions Readmission Risk Prevention Plan 01/22/2021  Medication Screening Complete  Transportation Screening Complete  Some recent data might be hidden

## 2021-01-25 NOTE — Interval H&P Note (Signed)
History and Physical Interval Note:  01/25/2021 9:32 AM  Haley Singh  has presented today for surgery, with the diagnosis of BACTEREMIA.  The various methods of treatment have been discussed with the patient and family. After consideration of risks, benefits and other options for treatment, the patient has consented to  Procedure(s): TRANSESOPHAGEAL ECHOCARDIOGRAM (TEE) (N/A) as a surgical intervention.  The patient's history has been reviewed, patient examined, no change in status, stable for surgery.  I have reviewed the patient's chart and labs.  Questions were answered to the patient's satisfaction.     Zunaira Lamy Cristal Deer

## 2021-01-25 NOTE — Progress Notes (Signed)
Nutrition Follow-up  DOCUMENTATION CODES:   Obesity unspecified  INTERVENTION:   -Prosource Plus PO BID, each provides 100 kcals and 15g protein  -Ensure MAX Protein po BID, each supplement provides 150 kcal and 30 grams of protein   NUTRITION DIAGNOSIS:   Increased nutrient needs related to acute illness as evidenced by estimated needs.  GOAL:   Patient will meet greater than or equal to 90% of their needs  MONITOR:   Diet advancement, Labs, Weight trends, I & O's, Skin  REASON FOR ASSESSMENT:   Malnutrition Screening Tool    ASSESSMENT:   70 y.o. female with medical history significant of chronic back pain, OA, HTN, HLD, asthma, presented with sudden onset of severe back pain.  8/17: s/p aspiration of left L4/L5 facet joint 8/24: TEE  Patient was NPO for procedure this morning. Was consuming 50-75% of meals yesterday when on diet. Diet now back to [facility]. Pt accepting of Ensure and Prosource supplements.  Admission weight: 200 lbs. No other weights this admission.  Medications: Miralax, KLOR-CON, Senokot  Labs reviewed.  Diet Order:   Diet Order             Diet Heart Room service appropriate? Yes; Fluid consistency: Thin  Diet effective now                   EDUCATION NEEDS:   No education needs have been identified at this time  Skin:  Skin Assessment: Reviewed RN Assessment  Last BM:  8/17 -type 3  Height:   Ht Readings from Last 1 Encounters:  01/17/21 5\' 6"  (1.676 m)    Weight:   Wt Readings from Last 1 Encounters:  01/17/21 90.7 kg    BMI:  Body mass index is 32.28 kg/m.  Estimated Nutritional Needs:   Kcal:  1650-1850  Protein:  75-95g  Fluid:  1.9L/day  01/19/21, MS, RD, LDN Inpatient Clinical Dietitian Contact information available via Amion

## 2021-01-25 NOTE — Transfer of Care (Signed)
Immediate Anesthesia Transfer of Care Note  Patient: Haley Singh  Procedure(s) Performed: TRANSESOPHAGEAL ECHOCARDIOGRAM (TEE) BUBBLE STUDY  Patient Location: Endoscopy Unit  Anesthesia Type:MAC  Level of Consciousness: awake, drowsy and patient cooperative  Airway & Oxygen Therapy: Patient Spontanous Breathing and Patient connected to nasal cannula oxygen  Post-op Assessment: Report given to RN, Post -op Vital signs reviewed and stable and Patient moving all extremities X 4  Post vital signs: Reviewed and stable  Last Vitals:  Vitals Value Taken Time  BP    Temp    Pulse    Resp    SpO2      Last Pain:  Vitals:   01/25/21 0855  TempSrc: Temporal  PainSc: 10-Worst pain ever      Patients Stated Pain Goal: 1 (75/64/33 2951)  Complications: No notable events documented.

## 2021-01-25 NOTE — CV Procedure (Signed)
    TRANSESOPHAGEAL ECHOCARDIOGRAM   NAME:  Haley Singh   MRN: [id number] DOB:  07/19/1950   ADMIT DATE: 01/17/2021  INDICATIONS: MSSA bacteremia  PROCEDURE:   Informed consent was obtained prior to the procedure. The risks, benefits and alternatives for the procedure were discussed and the patient comprehended these risks.  Risks include, but are not limited to, cough, sore throat, vomiting, nausea, somnolence, esophageal and stomach trauma or perforation, bleeding, low blood pressure, aspiration, pneumonia, infection, trauma to the teeth and death.    Procedural time out performed. The oropharynx was anesthetized with topical 1% cetacaine.    Patient received monitored anesthesia care under the supervision of Dr. Chaney Malling. Patient received a total of 428 mg propofol during the procedure.  The transesophageal probe was inserted in the esophagus and stomach without difficulty and multiple views were obtained.    COMPLICATIONS:    There were no immediate complications.  FINDINGS:  LEFT VENTRICLE: EF = 60-65%. No regional wall motion abnormalities.  RIGHT VENTRICLE: Normal size and function.   LEFT ATRIUM: No thrombus/mass.  LEFT ATRIAL APPENDAGE: No thrombus/mass.   RIGHT ATRIUM: No thrombus/mass.  AORTIC VALVE:  Trileaflet. Trivial regurgitation. No vegetation.  MITRAL VALVE:   Mild prolapse/protrusion of portion of leaflet, will be further evaluated on 3D images. Trivial regurgitation. No vegetation.  TRICUSPID VALVE: Normal structure. Trivial regurgitation. No vegetation.  PULMONIC VALVE: Grossly normal structure. Trivial regurgitation. No apparent vegetation.  INTERATRIAL SEPTUM: There is a PFO seen by color Doppler, with left to right flow. Agitated saline contrast shows there is NO right to left shunt.  PERICARDIUM: Trivial effusion noted.  DESCENDING AORTA: Moderate diffuse plaque seen   CONCLUSION: No evidence of endocarditis. Small PFO with left to right  flow but no right to left flow.   Jodelle Red, MD, PhD [facility]  [address], [address] [place], [place] [address] [phone]   10:20 AM

## 2021-01-25 NOTE — Progress Notes (Signed)
Patient ID: EARLY ORD, female   DOB: 1950/12/29, 70 y.o.   MRN: [id number]  PROGRESS NOTE    Haley Singh  WUJ:[id number] DOB: [birth year] DOA: 01/17/2021 PCP: Macy Mis, MD   Brief Narrative:  70 year old female with history of asthma, hypertension, depression, hyperlipidemia, bilateral renal stones requiring double-J stents along with lithotripsy, small bowel obstruction requiring laparotomy with bowel resection and primary anastomosis in 2005 presented with worsening severe back pain.  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  Patient underwent left L4-5 facet joint aspirate by IR on 01/18/2021.  ID was consulted.  Neurosurgery recommended conservative management.  Antibiotics currently have been changed to cefazolin.  Assessment & Plan:   Sepsis: Present on admission MSSA bacteremia L4-L5 vertebral osteomyelitis/discitis -ID following.  Currently on cefazolin. -There was a question of possible epidural abscess on CT: This was reviewed by neurosurgery/Dr. Conchita Paris on 01/24/2021 who thought that patient does not have epidural abscess and recommended to continue conservative management with antibiotics.  Outpatient follow-up with neurosurgery as needed -For TEE today. -Repeat cultures from 01/23/2021 have been negative so far. -Continue pain management.  PT recommending SNF placement.  TOC following  Hypertension -Continue amlodipine and losartan  History of migraines -Continue Imitrex as needed  Depression -Continue trazodone and Effexor  Neuropathy -Continue gabapentin  Hyperlipidemia -Continue statin  Asthma  -stable.  Continue montelukast and inhalers.   DVT prophylaxis: Lovenox Code Status: Full Family Communication: None at bedside Disposition Plan: Status is: Inpatient  Remains inpatient appropriate because:Inpatient level of care appropriate due to severity of illness  Dispo: The patient is from: Home               Anticipated d/c is to: SNF              Patient currently is not medically stable to d/c.   Difficult to place patient No   Consultants: Neurosurgery/IR/ID/cardiology for TEE  Procedures: L4-5 joint aspirate by IR on 01/18/2021  Antimicrobials:  Anti-infectives (From admission, onward)    Start     Dose/Rate Route Frequency Ordered Stop   01/20/21 0600  ceFAZolin (ANCEF) IVPB 2g/100 mL premix        2 g 200 mL/hr over 30 Minutes Intravenous Every 8 hours 01/19/21 1253     01/19/21 1000  vancomycin (VANCOREADY) IVPB 1250 mg/250 mL  Status:  Discontinued        1,250 mg 166.7 mL/hr over 90 Minutes Intravenous Every 24 hours 01/19/21 0856 01/19/21 1253   01/17/21 1600  vancomycin (VANCOREADY) IVPB 1500 mg/300 mL  Status:  Discontinued        1,500 mg 150 mL/hr over 120 Minutes Intravenous  Once 01/17/21 1528 01/17/21 1647        Subjective: Patient seen and examined at bedside.  Complains of back pain.  No overnight fever, vomiting, worsening shortness of breath reported.  Objective: Vitals:   01/25/21 0855 01/25/21 1024 01/25/21 1034 01/25/21 1045  BP: (!) 153/69 (!) 108/48 (!) 136/59 126/81  Pulse: 95 72 83 83  Resp: 20 15 (!) 21 15  Temp: 97.7 F (36.5 C) 98.4 F (36.9 C)    TempSrc: Temporal Axillary    SpO2: 95% 100% 97% 97%  Weight:      Height:        Intake/Output Summary (Last 24 hours) at 01/25/2021 1134 Last data filed at 01/25/2021 1022 Gross per 24 hour  Intake 1033.36 ml  Output 1150 ml  Net -  116.64 ml   Filed Weights   01/17/21 1028  Weight: 90.7 kg    Examination:  General exam: Appears calm and comfortable.  Currently on room air.  Poor historian  respiratory system: Bilateral decreased breath sounds at bases with scattered crackles Cardiovascular system: S1 & S2 heard, Rate controlled Gastrointestinal system: Abdomen is nondistended, soft and nontender. Normal bowel sounds heard. Extremities: No cyanosis, clubbing; trace lower  extremity edema present  Central nervous system: Alert and oriented.  Slow to respond.  No focal neurological deficits. Moving extremities Skin: No rashes, lesions or ulcers Psychiatry: Affect is mostly flat    Data Reviewed: I have personally reviewed following labs and imaging studies  CBC: Recent Labs  Lab 01/19/21 0552 01/20/21 0432 01/21/21 0445 01/24/21 0410 01/25/21 0447  WBC 17.1* 14.0* 11.5* 9.3 9.7  NEUTROABS 15.5* 11.8* 9.9* 6.7 6.8  HGB 10.0* 9.0* 9.9* 9.6* 9.4*  HCT 31.7* 28.0* 31.1* 31.4* 30.2*  MCV 90.8 90.9 89.9 90.8 91.8  PLT 261 269 287 299 290   Basic Metabolic Panel: Recent Labs  Lab 01/20/21 0432 01/21/21 0445 01/24/21 0410 01/25/21 0447 01/25/21 0739  NA 134* 135 137 137 137  K 3.5 3.4* 3.0* 4.0 3.8  CL 99 99 98 100 100  CO2 GLUCOSE 115* 123* 106* 85 92  BUN CREATININE 0.77 0.61 0.57 0.66 0.64  CALCIUM 8.1* 8.2* 8.3* 8.4* 8.3*  MG  --   --  2.0 2.1  --    GFR: Estimated Creatinine Clearance: 72.1 mL/min (by C-G formula based on SCr of 0.64 mg/dL). Liver Function Tests: Recent Labs  Lab 01/19/21 0552 01/20/21 0432 01/21/21 0445 01/24/21 0410 01/25/21 0447  AST 11* 14* 15 22 36  ALT ALKPHOS 96 87 98 92 91  BILITOT 1.2 0.7 0.7 0.3 0.6  PROT 7.4 7.2 7.5 7.5 7.3  ALBUMIN 2.5* 2.4* 2.4* 2.3* 2.2*   No results for input(s): LIPASE, AMYLASE in the last 168 hours. No results for input(s): AMMONIA in the last 168 hours. Coagulation Profile: No results for input(s): INR, PROTIME in the last 168 hours. Cardiac Enzymes: No results for input(s): CKTOTAL, CKMB, CKMBINDEX, TROPONINI in the last 168 hours. BNP (last 3 results) No results for input(s): PROBNP in the last 8760 hours. HbA1C: No results for input(s): HGBA1C in the last 72 hours. CBG: No results for input(s): GLUCAP in the last 168 hours. Lipid Profile: No results for input(s): CHOL, HDL, LDLCALC, TRIG, CHOLHDL, LDLDIRECT in the  last 72 hours. Thyroid Function Tests: No results for input(s): TSH, T4TOTAL, FREET4, T3FREE, THYROIDAB in the last 72 hours. Anemia Panel: No results for input(s): VITAMINB12, FOLATE, FERRITIN, TIBC, IRON, RETICCTPCT in the last 72 hours. Sepsis Labs: No results for input(s): PROCALCITON, LATICACIDVEN in the last 168 hours.  Recent Results (from the past 240 hour(s))  SARS CORONAVIRUS 2 (TAT 6-24 HRS) Nasopharyngeal Nasopharyngeal Swab     Status: None   Collection Time: 01/17/21  5:42 PM   Specimen: Nasopharyngeal Swab  Result Value Ref Range Status   SARS Coronavirus 2 NEGATIVE NEGATIVE Final    Comment: (NOTE) SARS-CoV-2 target nucleic acids are NOT DETECTED.  The SARS-CoV-2 RNA is generally detectable in upper and lower respiratory specimens during the acute phase of infection. Negative results do not preclude SARS-CoV-2 infection, do not rule out co-infections with other pathogens, and should not be used as the sole basis for treatment  or other patient management decisions. Negative results must be combined with clinical observations, patient history, and epidemiological information. The expected result is Negative.  Fact Sheet for Patients: HairSlick.no  Fact Sheet for Healthcare Providers: quierodirigir.com  This test is not yet approved or cleared by the [place] FDA and  has been authorized for detection and/or diagnosis of SARS-CoV-2 by FDA under an Emergency Use Authorization (EUA). This EUA will remain  in effect (meaning this test can be used) for the duration of the COVID-19 declaration under Se ction 564(b)(1) of the Act, 21 U.S.C. section 360bbb-3(b)(1), unless the authorization is terminated or revoked sooner.  Performed at [facility], [address] [place], [place] [address]   Aerobic/Anaerobic Culture w Gram Stain (surgical/deep wound)     Status: None   Collection Time: 01/18/21  2:52  PM   Specimen: Abscess  Result Value Ref Range Status   Specimen Description   Final    ABSCESS VERTEBRA Performed at [facility], [address], [place], [place] [address]    Special Requests   Final    NONE Performed at [facility], [address], [place], [place] [address]    Gram Stain   Final    RARE WBC PRESENT, PREDOMINANTLY MONONUCLEAR NO ORGANISMS SEEN    Culture   Final    No growth aerobically or anaerobically. Performed at [facility], [address] [place], [place] [address]    Report Status 01/23/2021 FINAL  Final  Culture, blood (Routine X 2) w Reflex to ID Panel     Status: Abnormal   Collection Time: 01/18/21  5:08 PM   Specimen: BLOOD  Result Value Ref Range Status   Specimen Description   Final    BLOOD LEFT ANTECUBITAL Performed at [facility], [address], [place], [place] [address]    Special Requests   Final    BOTTLES DRAWN AEROBIC AND ANAEROBIC Blood Culture adequate volume Performed at [facility], [address], [place], [place] [address]    Culture  Setup Time   Final    GRAM POSITIVE COCCI IN CLUSTERS IN BOTH AEROBIC AND ANAEROBIC BOTTLES CRITICAL VALUE NOTED.  VALUE IS CONSISTENT WITH PREVIOUSLY REPORTED AND CALLED VALUE.    Culture (A)  Final    STAPHYLOCOCCUS AUREUS SUSCEPTIBILITIES PERFORMED ON PREVIOUS CULTURE WITHIN THE LAST 5 DAYS. Performed at [facility], [address] [place], [place] [address]    Report Status 01/21/2021 FINAL  Final  Culture, blood (Routine X 2) w Reflex to ID Panel     Status: Abnormal   Collection Time: 01/18/21  5:08 PM   Specimen: BLOOD RIGHT HAND  Result Value Ref Range Status   Specimen Description   Final    BLOOD RIGHT HAND Performed at [facility], [address], [place], [place] [address]    Special Requests   Final    BOTTLES DRAWN AEROBIC AND ANAEROBIC Blood Culture adequate  volume Performed at [facility], [address] [place], [place] [address]    Culture  Setup Time   Final    GRAM POSITIVE COCCI IN CLUSTERS IN BOTH AEROBIC AND ANAEROBIC BOTTLES CRITICAL RESULT CALLED TO, READ BACK BY AND VERIFIED WITH: PHARMD MARY SWAYNE 01/19/2021 @1204  BY JW Performed at [facility], [address] [place], [place]    Culture STAPHYLOCOCCUS AUREUS (A)  Final   Report  Status 01/21/2021 FINAL  Final   Organism ID, Bacteria STAPHYLOCOCCUS AUREUS  Final      Susceptibility   Staphylococcus aureus - MIC*    CIPROFLOXACIN >=8 RESISTANT Resistant     ERYTHROMYCIN <=0.25 SENSITIVE Sensitive     GENTAMICIN <=0.5 SENSITIVE Sensitive     OXACILLIN <=0.25 SENSITIVE Sensitive     TETRACYCLINE <=1 SENSITIVE Sensitive     VANCOMYCIN <=0.5 SENSITIVE Sensitive     TRIMETH/SULFA <=10 SENSITIVE Sensitive     CLINDAMYCIN <=0.25 SENSITIVE Sensitive     RIFAMPIN <=0.5 SENSITIVE Sensitive     Inducible Clindamycin NEGATIVE Sensitive     * STAPHYLOCOCCUS AUREUS  Blood Culture ID Panel (Reflexed)     Status: Abnormal   Collection Time: 01/18/21  5:08 PM  Result Value Ref Range Status   Enterococcus faecalis NOT DETECTED NOT DETECTED Final   Enterococcus Faecium NOT DETECTED NOT DETECTED Final   Listeria monocytogenes NOT DETECTED NOT DETECTED Final   Staphylococcus species DETECTED (A) NOT DETECTED Final    Comment: CRITICAL RESULT CALLED TO, READ BACK BY AND VERIFIED WITH: PHARMD MARY SWAYNE 01/19/2021 @1204  BY JW    Staphylococcus aureus (BCID) DETECTED (A) NOT DETECTED Final    Comment: CRITICAL RESULT CALLED TO, READ BACK BY AND VERIFIED WITH: PHARMD MARY SWAYNE 01/19/2021 @1204  BY JW    Staphylococcus epidermidis NOT DETECTED NOT DETECTED Final   Staphylococcus lugdunensis NOT DETECTED NOT DETECTED Final   Streptococcus species NOT DETECTED NOT DETECTED Final   Streptococcus agalactiae NOT DETECTED NOT DETECTED Final   Streptococcus  pneumoniae NOT DETECTED NOT DETECTED Final   Streptococcus pyogenes NOT DETECTED NOT DETECTED Final   A.calcoaceticus-baumannii NOT DETECTED NOT DETECTED Final   Bacteroides fragilis NOT DETECTED NOT DETECTED Final   Enterobacterales NOT DETECTED NOT DETECTED Final   Enterobacter cloacae complex NOT DETECTED NOT DETECTED Final   Escherichia coli NOT DETECTED NOT DETECTED Final   Klebsiella aerogenes NOT DETECTED NOT DETECTED Final   Klebsiella oxytoca NOT DETECTED NOT DETECTED Final   Klebsiella pneumoniae NOT DETECTED NOT DETECTED Final   Proteus species NOT DETECTED NOT DETECTED Final   Salmonella species NOT DETECTED NOT DETECTED Final   Serratia marcescens NOT DETECTED NOT DETECTED Final   Haemophilus influenzae NOT DETECTED NOT DETECTED Final   Neisseria meningitidis NOT DETECTED NOT DETECTED Final   Pseudomonas aeruginosa NOT DETECTED NOT DETECTED Final   Stenotrophomonas maltophilia NOT DETECTED NOT DETECTED Final   Candida albicans NOT DETECTED NOT DETECTED Final   Candida auris NOT DETECTED NOT DETECTED Final   Candida glabrata NOT DETECTED NOT DETECTED Final   Candida krusei NOT DETECTED NOT DETECTED Final   Candida parapsilosis NOT DETECTED NOT DETECTED Final   Candida tropicalis NOT DETECTED NOT DETECTED Final   Cryptococcus neoformans/gattii NOT DETECTED NOT DETECTED Final   Meth resistant mecA/C and MREJ NOT DETECTED NOT DETECTED Final    Comment: Performed at [facility], [address] [place], [address] [place]  Culture, blood (routine x 2)     Status: Abnormal   Collection Time: 01/20/21  4:26 AM   Specimen: BLOOD  Result Value Ref Range Status   Specimen Description   Final    BLOOD LEFT ANTECUBITAL Performed at [facility], [address] [place], [place]    Special Requests   Final    BOTTLES DRAWN AEROBIC AND ANAEROBIC Blood Culture adequate volume Performed at [facility], [address],  [place], [place]  Culture  Setup Time   Final    GRAM POSITIVE COCCI IN CLUSTERS ANAEROBIC BOTTLE ONLY CRITICAL VALUE NOTED.  VALUE IS CONSISTENT WITH PREVIOUSLY REPORTED AND CALLED VALUE.    Culture (A)  Final    STAPHYLOCOCCUS AUREUS SUSCEPTIBILITIES PERFORMED ON PREVIOUS CULTURE WITHIN THE LAST 5 DAYS. Performed at [facility], [address], [place], [place] [address]    Report Status 01/23/2021 FINAL  Final  Culture, blood (routine x 2)     Status: Abnormal   Collection Time: 01/20/21  4:32 AM   Specimen: BLOOD RIGHT HAND  Result Value Ref Range Status   Specimen Description   Final    BLOOD RIGHT HAND Performed at [facility], [address] [place], [place] [address]    Special Requests   Final    BOTTLES DRAWN AEROBIC ONLY Blood Culture adequate volume Performed at [facility], [address], [place], [place] [address]    Culture  Setup Time   Final    GRAM POSITIVE COCCI IN CLUSTERS AEROBIC BOTTLE ONLY CRITICAL VALUE NOTED.  VALUE IS CONSISTENT WITH PREVIOUSLY REPORTED AND CALLED VALUE.    Culture (A)  Final    STAPHYLOCOCCUS AUREUS SUSCEPTIBILITIES PERFORMED ON PREVIOUS CULTURE WITHIN THE LAST 5 DAYS. Performed at [facility], [address], [place], [place] [address]    Report Status 01/24/2021 FINAL  Final  Culture, blood (Routine X 2) w Reflex to ID Panel     Status: None (Preliminary result)   Collection Time: 01/23/21 11:04 AM   Specimen: BLOOD  Result Value Ref Range Status   Specimen Description   Final    BLOOD BLOOD LEFT HAND Performed at [facility], [address], [place], [place] [address]    Special Requests   Final    BOTTLES DRAWN AEROBIC ONLY Blood Culture results may not be optimal due to an inadequate volume of blood received in culture bottles Performed at [facility], [address] [place], [place] [address]    Culture   Final    NO  GROWTH 2 DAYS Performed at [facility], [address], [place], [place] [address]    Report Status PENDING  Incomplete  Culture, blood (Routine X 2) w Reflex to ID Panel     Status: None (Preliminary result)   Collection Time: 01/23/21 11:05 AM   Specimen: BLOOD  Result Value Ref Range Status   Specimen Description   Final    BLOOD LEFT ANTECUBITAL Performed at [facility], [address], [place], [place] [address]    Special Requests   Final    BOTTLES DRAWN AEROBIC AND ANAEROBIC Blood Culture results may not be optimal due to an excessive volume of blood received in culture bottles Performed at [facility], [address] [place], [place] [address]    Culture   Final    NO GROWTH 2 DAYS Performed at [facility], [address], [place], [place] [address]    Report Status PENDING  Incomplete         Radiology Studies: CT ABDOMEN PELVIS W CONTRAST  Result Date: 01/23/2021 CLINICAL DATA:  History of chronic low back pain and lumbar discitis osteomyelitis, MRSA bacteremia, pelvic and thigh pain, elevated white blood cell count EXAM: CT ABDOMEN AND PELVIS WITH CONTRAST TECHNIQUE: Multidetector CT imaging of the abdomen and pelvis was performed using the standard protocol following bolus administration  of intravenous contrast. CONTRAST:  80mL OMNIPAQUE IOHEXOL 350 MG/ML SOLN COMPARISON:  01/17/2021, 08/12/2019 FINDINGS: Lower chest: Dependent linear consolidation within the lower lobes most consistent with atelectasis. Trace right pleural effusion. Hepatobiliary: Minimal high attenuation material within the gallbladder likely reflects sludge. Stable focal adenomyomatosis of the gallbladder fundus. No calcified gallstones or cholecystitis. The liver is unremarkable. Pancreas: Unremarkable. No pancreatic ductal dilatation or surrounding inflammatory changes. Spleen: Normal in size without focal abnormality. Adrenals/Urinary Tract: There  are bilateral nonobstructing renal calculi, decreased in number since prior study. Three distinct calculi are visible within the lower pole right kidney, largest measuring 9 mm. There are approximately 4 discrete left renal calculi, largest in the upper pole measuring 5 mm. No evidence of obstructive uropathy within either kidney. Indeterminate hypodensities are seen within the right kidney, measuring 11 mm on image 52/2 and 15 mm on image 57/2. These correspond to hyperdense areas on previous unenhanced exam. Dedicated renal MRI or CT is recommended if not previously performed. The adrenals and bladder are unremarkable. Stomach/Bowel: No bowel obstruction or ileus. Normal appendix right lower quadrant. No bowel wall thickening or inflammatory change. Vascular/Lymphatic: Continued atherosclerosis throughout the aorta and its branches. No pathologically enlarged lymph nodes. Reproductive: Uterus and bilateral adnexa are unremarkable. Other: No free intraperitoneal fluid or free gas. No abdominal wall hernia. Musculoskeletal: The destructive changes at the left L4/L5 facet are again noted, consistent with given history of osteomyelitis. Please correlate with results of recent facet joint aspiration. There is severe central canal stenosis as result of the inflammatory changes surrounding the left facet joint as well as circumferential disc bulge, which appears slightly more pronounced since prior study. Additionally, abnormal soft tissue density is seen within the paraspinal soft tissues and retroperitoneum extending superiorly from the L4-5 level, most pronounced at L1, L2, and L3, highly concerning for ascending infection. I do not see any discrete paraspinal fluid collection or abscess at this time. Additionally, there is subtle enhancing soft tissue within the ventral aspect of the central canal from L4 through L1, concerning for possible epidural abscess. Correlation with follow-up MRI is recommended.  Reconstructed images demonstrate no additional findings. IMPRESSION: 1. Progressive osteomyelitis at the left L4-5 facet joint, with evidence of ascending infection and probable epidural abscess and paraspinal phlegmon from L1 through L4. Follow-up MRI lumbar spine with and without contrast is recommended for more detailed evaluation. 2. Bilateral nonobstructing renal calculi. No evidence of obstructive uropathy within either kidney. 3. Indeterminate hypodensities right kidney, possibly hyperdense cysts, slightly increased since prior study. Follow-up renal MRI or CT could be performed if not previously evaluated. 4. Minimal gallbladder sludge.  No cholelithiasis or cholecystitis. 5.  Aortic Atherosclerosis (ICD10-I70.0). Critical Value/emergent results were called by telephone at the time of interpretation on 01/23/2021 at 4:28 pm to provider [facility] , who verbally acknowledged these results. Electronically Signed   By: Sharlet SalinaMichael  Brown M.D.   On: 01/23/2021 16:38        Scheduled Meds:  (feeding supplement) PROSource Plus  30 mL Oral BID BM   amLODipine  5 mg Oral Daily   atorvastatin  40 mg Oral Daily   enoxaparin (LOVENOX) injection  40 mg Subcutaneous Q24H   fluticasone  1 spray Each Nare BID   fluticasone furoate-vilanterol  1 puff Inhalation Daily   gabapentin  600 mg Oral QHS   lidocaine  1 patch Transdermal Q24H   loratadine  10 mg Oral Daily   losartan  50 mg Oral Daily  montelukast  10 mg Oral QHS   oxyCODONE  15 mg Oral Q12H   polyethylene glycol  17 g Oral Daily   potassium chloride  40 mEq Oral BID   Ensure Max Protein  11 oz Oral Daily   senna-docusate  1 tablet Oral BID   traZODone  150 mg Oral QHS   venlafaxine XR  150 mg Oral q morning   Continuous Infusions:  sodium chloride 250 mL (01/21/21 1814)    ceFAZolin (ANCEF) IV 2 g (01/25/21 1127)   methocarbamol (ROBAXIN) IV 500 mg (01/22/21 0249)          Glade Lloyd, MD [facility] 01/25/2021, 11:34 AM

## 2021-01-25 NOTE — Progress Notes (Signed)
  Echocardiogram Echocardiogram Transesophageal has been performed.  Leta Jungling M 01/25/2021, 11:25 AM

## 2021-01-25 NOTE — Anesthesia Preprocedure Evaluation (Signed)
Anesthesia Evaluation  Patient identified by MRN, date of birth, ID band Patient awake    Reviewed: Allergy & Precautions, H&P , NPO status , Patient's Chart, lab work & pertinent test results  Airway Mallampati: II   Neck ROM: full    Dental   Pulmonary asthma , sleep apnea ,    breath sounds clear to auscultation       Cardiovascular hypertension,  Rhythm:regular Rate:Normal     Neuro/Psych PSYCHIATRIC DISORDERS Anxiety Depression    GI/Hepatic   Endo/Other    Renal/GU stones     Musculoskeletal  (+) Arthritis ,   Abdominal   Peds  Hematology  (+) anemia ,   Anesthesia Other Findings   Reproductive/Obstetrics                             Anesthesia Physical Anesthesia Plan  ASA: 3  Anesthesia Plan: MAC   Post-op Pain Management:    Induction: Intravenous  PONV Risk Score and Plan: 2 and Propofol infusion and Treatment may vary due to age or medical condition  Airway Management Planned: Nasal Cannula  Additional Equipment:   Intra-op Plan:   Post-operative Plan:   Informed Consent: I have reviewed the patients History and Physical, chart, labs and discussed the procedure including the risks, benefits and alternatives for the proposed anesthesia with the patient or authorized representative who has indicated his/her understanding and acceptance.     Dental advisory given  Plan Discussed with: CRNA, Anesthesiologist and Surgeon  Anesthesia Plan Comments:         Anesthesia Quick Evaluation

## 2021-01-26 ENCOUNTER — Inpatient Hospital Stay: Payer: Self-pay

## 2021-01-26 DIAGNOSIS — B9561 Methicillin susceptible Staphylococcus aureus infection as the cause of diseases classified elsewhere: Secondary | ICD-10-CM | POA: Diagnosis not present

## 2021-01-26 DIAGNOSIS — M868X8 Other osteomyelitis, other site: Secondary | ICD-10-CM | POA: Diagnosis not present

## 2021-01-26 DIAGNOSIS — R7881 Bacteremia: Secondary | ICD-10-CM | POA: Diagnosis not present

## 2021-01-26 DIAGNOSIS — M4646 Discitis, unspecified, lumbar region: Secondary | ICD-10-CM | POA: Diagnosis not present

## 2021-01-26 MED ORDER — COVID-19 MRNA VAC-TRIS(PFIZER) 30 MCG/0.3ML IM SUSP
0.3000 mL | Freq: Once | INTRAMUSCULAR | Status: DC
  Filled 2021-01-26: qty 0.3

## 2021-01-26 NOTE — Progress Notes (Signed)
Patient ID: Haley Singh, female   DOB: [birth year], 70 y.o.   MRN: [id number]  PROGRESS NOTE    TERITA HEJL  OVZ:[id number] DOB: 1950/11/26 DOA: 01/17/2021 PCP: Macy Mis, MD   Brief Narrative:  70 year old female with history of asthma, hypertension, depression, hyperlipidemia, bilateral renal stones requiring double-J stents along with lithotripsy, small bowel obstruction requiring laparotomy with bowel resection and primary anastomosis in 2005 presented with worsening severe back pain.  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  Patient underwent left L4-5 facet joint aspirate by IR on 01/18/2021.  ID was consulted.  Neurosurgery recommended conservative management.  Antibiotics currently have been changed to cefazolin.  Assessment & Plan:   Sepsis: Present on admission MSSA bacteremia L4-L5 vertebral osteomyelitis/discitis -ID following.  Currently on cefazolin. -There was a question of possible epidural abscess on CT: This was reviewed by neurosurgery/Dr. Conchita Paris on 01/24/2021 who thought that patient does not have epidural abscess and recommended to continue conservative management with antibiotics.  Outpatient follow-up with neurosurgery as needed -TEE was negative for vegetations. -Repeat cultures from 01/23/2021 have been negative so far.  Follow ID recommendations regarding duration of antibiotic regimen -Continue pain management.  PT recommending SNF placement.  TOC following  Hypertension -Continue amlodipine and losartan  History of migraines -Continue Imitrex as needed  Depression -Continue trazodone and Effexor  Neuropathy -Continue gabapentin  Hyperlipidemia -Continue statin  Asthma  -stable.  Continue montelukast and inhalers.   DVT prophylaxis: Lovenox Code Status: Full Family Communication: None at bedside Disposition Plan: Status is: Inpatient  Remains inpatient appropriate because:Inpatient level  of care appropriate due to severity of illness  Dispo: The patient is from: Home              Anticipated d/c is to: SNF once ID decides duration of antibiotic treatment.              Patient currently is medically stable to d/c.   Difficult to place patient No   Consultants: Neurosurgery/IR/ID/cardiology for TEE  Procedures: L4-5 joint aspirate by IR on 01/18/2021 TEE on on 01/25/2021 Antimicrobials:  Anti-infectives (From admission, onward)    Start     Dose/Rate Route Frequency Ordered Stop   01/20/21 0600  ceFAZolin (ANCEF) IVPB 2g/100 mL premix        2 g 200 mL/hr over 30 Minutes Intravenous Every 8 hours 01/19/21 1253     01/19/21 1000  vancomycin (VANCOREADY) IVPB 1250 mg/250 mL  Status:  Discontinued        1,250 mg 166.7 mL/hr over 90 Minutes Intravenous Every 24 hours 01/19/21 0856 01/19/21 1253   01/17/21 1600  vancomycin (VANCOREADY) IVPB 1500 mg/300 mL  Status:  Discontinued        1,500 mg 150 mL/hr over 120 Minutes Intravenous  Once 01/17/21 1528 01/17/21 1647        Subjective: Patient seen and examined at bedside.  No worsening shortness of breath, vomiting, fever or chest pain reported.  Still complains of intermittent back pain.  Objective: Vitals:   01/25/21 1034 01/25/21 1045 01/25/21 1800 01/26/21 0459  BP: (!) 136/59 126/81 128/76 134/67  Pulse: 83 83 77 78  Resp: (!) 21 15 18 17   Temp:   98.6 F (37 C) 98 F (36.7 C)  TempSrc:   Oral Oral  SpO2: 97% 97% 99% 95%  Weight:      Height:        Intake/Output Summary (Last 24 hours) at  01/26/2021 0800 Last data filed at 01/26/2021 0055 Gross per 24 hour  Intake 880 ml  Output 1800 ml  Net -920 ml    Filed Weights   01/17/21 1028  Weight: 90.7 kg    Examination:  General exam: No acute distress.  On room air currently.  poor historian  respiratory system: Decreased breath sounds at bases bilaterally cardiovascular system: Rate controlled, S1-S2 heard  gastrointestinal system: Abdomen is  distended slightly, soft and nontender.  Bowel sounds are heard Extremities: Mild lower extremity edema present; no cyanosis  Central nervous system: Awake and alert.  Still slow to respond.  No focal neurological deficits.  Moves extremities Skin: No obvious ecchymosis/rashes Psychiatry: Flat affect   Data Reviewed: I have personally reviewed following labs and imaging studies  CBC: Recent Labs  Lab 01/20/21 0432 01/21/21 0445 01/24/21 0410 01/25/21 0447  WBC 14.0* 11.5* 9.3 9.7  NEUTROABS 11.8* 9.9* 6.7 6.8  HGB 9.0* 9.9* 9.6* 9.4*  HCT 28.0* 31.1* 31.4* 30.2*  MCV 90.9 89.9 90.8 91.8  PLT 269 287 299 290    Basic Metabolic Panel: Recent Labs  Lab 01/20/21 0432 01/21/21 0445 01/24/21 0410 01/25/21 0447 01/25/21 0739  NA 134* 135 137 137 137  K 3.5 3.4* 3.0* 4.0 3.8  CL 99 99 98 100 100  CO2 28 27 29 29 29   GLUCOSE 115* 123* 106* 85 92  BUN 16 11 10 12 12   CREATININE 0.77 0.61 0.57 0.66 0.64  CALCIUM 8.1* 8.2* 8.3* 8.4* 8.3*  MG  --   --  2.0 2.1  --     GFR: Estimated Creatinine Clearance: 72.1 mL/min (by C-G formula based on SCr of 0.64 mg/dL). Liver Function Tests: Recent Labs  Lab 01/20/21 0432 01/21/21 0445 01/24/21 0410 01/25/21 0447  AST 14* 15 22 36  ALT 13 14 13 19   ALKPHOS 87 98 92 91  BILITOT 0.7 0.7 0.3 0.6  PROT 7.2 7.5 7.5 7.3  ALBUMIN 2.4* 2.4* 2.3* 2.2*    No results for input(s): LIPASE, AMYLASE in the last 168 hours. No results for input(s): AMMONIA in the last 168 hours. Coagulation Profile: No results for input(s): INR, PROTIME in the last 168 hours. Cardiac Enzymes: No results for input(s): CKTOTAL, CKMB, CKMBINDEX, TROPONINI in the last 168 hours. BNP (last 3 results) No results for input(s): PROBNP in the last 8760 hours. HbA1C: No results for input(s): HGBA1C in the last 72 hours. CBG: No results for input(s): GLUCAP in the last 168 hours. Lipid Profile: No results for input(s): CHOL, HDL, LDLCALC, TRIG, CHOLHDL,  LDLDIRECT in the last 72 hours. Thyroid Function Tests: No results for input(s): TSH, T4TOTAL, FREET4, T3FREE, THYROIDAB in the last 72 hours. Anemia Panel: No results for input(s): VITAMINB12, FOLATE, FERRITIN, TIBC, IRON, RETICCTPCT in the last 72 hours. Sepsis Labs: No results for input(s): PROCALCITON, LATICACIDVEN in the last 168 hours.  Recent Results (from the past 240 hour(s))  SARS CORONAVIRUS 2 (TAT 6-24 HRS) Nasopharyngeal Nasopharyngeal Swab     Status: None   Collection Time: 01/17/21  5:42 PM   Specimen: Nasopharyngeal Swab  Result Value Ref Range Status   SARS Coronavirus 2 NEGATIVE NEGATIVE Final    Comment: (NOTE) SARS-CoV-2 target nucleic acids are NOT DETECTED.  The SARS-CoV-2 RNA is generally detectable in upper and lower respiratory specimens during the acute phase of infection. Negative results do not preclude SARS-CoV-2 infection, do not rule out co-infections with other pathogens, and should not be used as the sole  basis for treatment or other patient management decisions. Negative results must be combined with clinical observations, patient history, and epidemiological information. The expected result is Negative.  Fact Sheet for Patients: HairSlick.no  Fact Sheet for Healthcare Providers: quierodirigir.com  This test is not yet approved or cleared by the [place] FDA and  has been authorized for detection and/or diagnosis of SARS-CoV-2 by FDA under an Emergency Use Authorization (EUA). This EUA will remain  in effect (meaning this test can be used) for the duration of the COVID-19 declaration under Se ction 564(b)(1) of the Act, 21 U.S.C. section 360bbb-3(b)(1), unless the authorization is terminated or revoked sooner.  Performed at [facility], [address], [place], [place] [address]   Aerobic/Anaerobic Culture w Gram Stain (surgical/deep wound)     Status: None   Collection  Time: 01/18/21  2:52 PM   Specimen: Abscess  Result Value Ref Range Status   Specimen Description   Final    ABSCESS VERTEBRA Performed at [facility], [address], [place], [place] [address]    Special Requests   Final    NONE Performed at [facility], [address], [place], [place] [address]    Gram Stain   Final    RARE WBC PRESENT, PREDOMINANTLY MONONUCLEAR NO ORGANISMS SEEN    Culture   Final    No growth aerobically or anaerobically. Performed at [facility], [address], [place], [place] [address]    Report Status 01/23/2021 FINAL  Final  Culture, blood (Routine X 2) w Reflex to ID Panel     Status: Abnormal   Collection Time: 01/18/21  5:08 PM   Specimen: BLOOD  Result Value Ref Range Status   Specimen Description   Final    BLOOD LEFT ANTECUBITAL Performed at [facility], [address], [place], [place] [address]    Special Requests   Final    BOTTLES DRAWN AEROBIC AND ANAEROBIC Blood Culture adequate volume Performed at [facility], [address], [place], [place] [address]    Culture  Setup Time   Final    GRAM POSITIVE COCCI IN CLUSTERS IN BOTH AEROBIC AND ANAEROBIC BOTTLES CRITICAL VALUE NOTED.  VALUE IS CONSISTENT WITH PREVIOUSLY REPORTED AND CALLED VALUE.    Culture (A)  Final    STAPHYLOCOCCUS AUREUS SUSCEPTIBILITIES PERFORMED ON PREVIOUS CULTURE WITHIN THE LAST 5 DAYS. Performed at [facility], [address] [place], [place] [address]    Report Status 01/21/2021 FINAL  Final  Culture, blood (Routine X 2) w Reflex to ID Panel     Status: Abnormal   Collection Time: 01/18/21  5:08 PM   Specimen: BLOOD RIGHT HAND  Result Value Ref Range Status   Specimen Description   Final    BLOOD RIGHT HAND Performed at [facility], [address] [place], [place] [address]    Special Requests   Final    BOTTLES DRAWN AEROBIC AND ANAEROBIC Blood Culture  adequate volume Performed at [facility], [address] [place], [place] [address]    Culture  Setup Time   Final    GRAM POSITIVE COCCI IN CLUSTERS IN BOTH AEROBIC AND ANAEROBIC BOTTLES CRITICAL RESULT CALLED TO, READ BACK BY AND VERIFIED WITH: PHARMD MARY SWAYNE 01/19/2021  BY JW Performed at [facility], [address], [place], [place] [address]    Culture STAPHYLOCOCCUS AUREUS (A)  Final  Report Status 01/21/2021 FINAL  Final   Organism ID, Bacteria STAPHYLOCOCCUS AUREUS  Final      Susceptibility   Staphylococcus aureus - MIC*    CIPROFLOXACIN >=8 RESISTANT Resistant     ERYTHROMYCIN <=0.25 SENSITIVE Sensitive     GENTAMICIN <=0.5 SENSITIVE Sensitive     OXACILLIN <=0.25 SENSITIVE Sensitive     TETRACYCLINE <=1 SENSITIVE Sensitive     VANCOMYCIN <=0.5 SENSITIVE Sensitive     TRIMETH/SULFA <=10 SENSITIVE Sensitive     CLINDAMYCIN <=0.25 SENSITIVE Sensitive     RIFAMPIN <=0.5 SENSITIVE Sensitive     Inducible Clindamycin NEGATIVE Sensitive     * STAPHYLOCOCCUS AUREUS  Blood Culture ID Panel (Reflexed)     Status: Abnormal   Collection Time: 01/18/21  5:08 PM  Result Value Ref Range Status   Enterococcus faecalis NOT DETECTED NOT DETECTED Final   Enterococcus Faecium NOT DETECTED NOT DETECTED Final   Listeria monocytogenes NOT DETECTED NOT DETECTED Final   Staphylococcus species DETECTED (A) NOT DETECTED Final    Comment: CRITICAL RESULT CALLED TO, READ BACK BY AND VERIFIED WITH: PHARMD MARY SWAYNE 01/19/2021 @1204  BY JW    Staphylococcus aureus (BCID) DETECTED (A) NOT DETECTED Final    Comment: CRITICAL RESULT CALLED TO, READ BACK BY AND VERIFIED WITH: PHARMD MARY SWAYNE 01/19/2021 @1204  BY JW    Staphylococcus epidermidis NOT DETECTED NOT DETECTED Final   Staphylococcus lugdunensis NOT DETECTED NOT DETECTED Final   Streptococcus species NOT DETECTED NOT DETECTED Final   Streptococcus agalactiae NOT DETECTED NOT DETECTED Final    Streptococcus pneumoniae NOT DETECTED NOT DETECTED Final   Streptococcus pyogenes NOT DETECTED NOT DETECTED Final   A.calcoaceticus-baumannii NOT DETECTED NOT DETECTED Final   Bacteroides fragilis NOT DETECTED NOT DETECTED Final   Enterobacterales NOT DETECTED NOT DETECTED Final   Enterobacter cloacae complex NOT DETECTED NOT DETECTED Final   Escherichia coli NOT DETECTED NOT DETECTED Final   Klebsiella aerogenes NOT DETECTED NOT DETECTED Final   Klebsiella oxytoca NOT DETECTED NOT DETECTED Final   Klebsiella pneumoniae NOT DETECTED NOT DETECTED Final   Proteus species NOT DETECTED NOT DETECTED Final   Salmonella species NOT DETECTED NOT DETECTED Final   Serratia marcescens NOT DETECTED NOT DETECTED Final   Haemophilus influenzae NOT DETECTED NOT DETECTED Final   Neisseria meningitidis NOT DETECTED NOT DETECTED Final   Pseudomonas aeruginosa NOT DETECTED NOT DETECTED Final   Stenotrophomonas maltophilia NOT DETECTED NOT DETECTED Final   Candida albicans NOT DETECTED NOT DETECTED Final   Candida auris NOT DETECTED NOT DETECTED Final   Candida glabrata NOT DETECTED NOT DETECTED Final   Candida krusei NOT DETECTED NOT DETECTED Final   Candida parapsilosis NOT DETECTED NOT DETECTED Final   Candida tropicalis NOT DETECTED NOT DETECTED Final   Cryptococcus neoformans/gattii NOT DETECTED NOT DETECTED Final   Meth resistant mecA/C and MREJ NOT DETECTED NOT DETECTED Final    Comment: Performed at [facility], [address] [place], [address] [place]  Culture, blood (routine x 2)     Status: Abnormal   Collection Time: 01/20/21  4:26 AM   Specimen: BLOOD  Result Value Ref Range Status   Specimen Description   Final    BLOOD LEFT ANTECUBITAL Performed at [facility], [address] [place], [place]    Special Requests   Final    BOTTLES DRAWN AEROBIC AND ANAEROBIC Blood Culture adequate volume Performed at [facility], [address] [place], [place]  Culture  Setup Time   Final    GRAM POSITIVE COCCI IN CLUSTERS ANAEROBIC BOTTLE ONLY CRITICAL VALUE NOTED.  VALUE IS CONSISTENT WITH PREVIOUSLY REPORTED AND CALLED VALUE.    Culture (A)  Final    STAPHYLOCOCCUS AUREUS SUSCEPTIBILITIES PERFORMED ON PREVIOUS CULTURE WITHIN THE LAST 5 DAYS. Performed at [facility], [address], [place], [place] [address]    Report Status 01/23/2021 FINAL  Final  Culture, blood (routine x 2)     Status: Abnormal   Collection Time: 01/20/21  4:32 AM   Specimen: BLOOD RIGHT HAND  Result Value Ref Range Status   Specimen Description   Final    BLOOD RIGHT HAND Performed at [facility], [address], [place], [place] [address]    Special Requests   Final    BOTTLES DRAWN AEROBIC ONLY Blood Culture adequate volume Performed at [facility], [address], [place], [place] [address]    Culture  Setup Time   Final    GRAM POSITIVE COCCI IN CLUSTERS AEROBIC BOTTLE ONLY CRITICAL VALUE NOTED.  VALUE IS CONSISTENT WITH PREVIOUSLY REPORTED AND CALLED VALUE.    Culture (A)  Final    STAPHYLOCOCCUS AUREUS SUSCEPTIBILITIES PERFORMED ON PREVIOUS CULTURE WITHIN THE LAST 5 DAYS. Performed at [facility], [address], [place], [place] [address]    Report Status 01/24/2021 FINAL  Final  Culture, blood (Routine X 2) w Reflex to ID Panel     Status: None (Preliminary result)   Collection Time: 01/23/21 11:04 AM   Specimen: BLOOD  Result Value Ref Range Status   Specimen Description   Final    BLOOD BLOOD LEFT HAND Performed at [facility], [address] [place], [place] [address]    Special Requests   Final    BOTTLES DRAWN AEROBIC ONLY Blood Culture results may not be optimal due to an inadequate volume of blood received in culture bottles Performed at [facility], [address], [place], [place] [address]    Culture    Final    NO GROWTH 2 DAYS Performed at [facility], [address] [place], [place] [address]    Report Status PENDING  Incomplete  Culture, blood (Routine X 2) w Reflex to ID Panel     Status: None (Preliminary result)   Collection Time: 01/23/21 11:05 AM   Specimen: BLOOD  Result Value Ref Range Status   Specimen Description   Final    BLOOD LEFT ANTECUBITAL Performed at [facility], [address], [place], [place] [address]    Special Requests   Final    BOTTLES DRAWN AEROBIC AND ANAEROBIC Blood Culture results may not be optimal due to an excessive volume of blood received in culture bottles Performed at [facility], [address], [place], [place] [address]    Culture   Final    NO GROWTH 2 DAYS Performed at [facility], [address] [place], [place] [address]    Report Status PENDING  Incomplete          Radiology Studies: ECHO TEE  Result Date: 01/25/2021    TRANSESOPHOGEAL ECHO REPORT   Patient Name:   MARYCLARE NYDAM Date of Exam: 01/25/2021 Medical Rec #:  [id number]       Height:       66.0 in Accession #:    [id number]      Weight:  200.0 lb Date of Birth:  1950/09/20        BSA:          2.000 m Patient Age:    70 years        BP:           126/81 mmHg Patient Gender: F               HR:           83 bpm. Exam Location:  Inpatient Procedure: 3D Echo, 2D Echo and Color Doppler Indications:     Bacteremia  History:         Patient has prior history of Echocardiogram examinations, most                  recent 01/19/2021. Risk Factors:Hypertension and Dyslipidemia.  Sonographer:     Leta Jungling RDCS Referring Phys:  [id number] Roe Rutherford [facility] Diagnosing Phys: Jodelle Red MD PROCEDURE: After discussion of the risks and benefits of a TEE, an informed consent was obtained from the patient. TEE procedure time was 17 minutes. The transesophogeal probe was passed without difficulty through the esophogus of  the patient. Imaged were obtained with the patient in a left lateral decubitus position. Local oropharyngeal anesthetic was provided with Cetacaine. Sedation performed by different physician. The patient was monitored while under deep sedation. Anesthestetic sedation was provided intravenously by Anesthesiology:  of Propofol. Image quality was excellent. The patient's vital signs; including heart rate, blood pressure, and oxygen saturation; remained stable throughout the procedure. The patient developed no complications during the procedure. IMPRESSIONS  1. Left ventricular ejection fraction, by estimation, is 60 to 65%. The left ventricle has normal function.  2. Right ventricular systolic function is normal. The right ventricular size is normal.  3. No left atrial/left atrial appendage thrombus was detected.  4. The mitral valve is abnormal. Trivial mitral valve regurgitation. No evidence of mitral stenosis. 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  5. The aortic valve is tricuspid. Aortic valve regurgitation is trivial. No aortic stenosis is present.  6. 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  7. Evidence of atrial level shunting detected by color flow Doppler. Agitated saline contrast bubble study was negative, with no evidence of any interatrial shunt. There is a small patent foramen ovale with predominantly left to right shunting across the atrial septum. Conclusion(s)/Recommendation(s): No evidence of vegetation/infective endocarditis on this transesophageal echocardiogram. Small PFO with left to right flow but no right to left flow. FINDINGS  Left Ventricle: Left ventricular ejection fraction, by estimation, is 60 to 65%. The left ventricle has normal function. The left ventricular internal cavity size was normal in size. Right Ventricle: The right ventricular size is normal. No increase in right ventricular  wall thickness. Right ventricular systolic function is normal. Left Atrium: Left atrial size was normal in size. No left atrial/left atrial appendage thrombus was detected. Right Atrium: Right atrial size was normal in size. Pericardium: Trivial pericardial effusion is present. Mitral Valve: The mitral valve is abnormal. 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 Trivial mitral valve regurgitation. No evidence of mitral valve stenosis. There is no evidence of mitral valve vegetation. Tricuspid Valve: The tricuspid valve is normal in structure. Tricuspid valve regurgitation is trivial. No evidence of tricuspid stenosis. There is no evidence of tricuspid valve vegetation. Aortic Valve: The  aortic valve is tricuspid. Aortic valve regurgitation is trivial. No aortic stenosis is present. There is no evidence of aortic valve vegetation. Pulmonic Valve: The pulmonic valve was normal in structure. Pulmonic valve regurgitation is trivial. No evidence of pulmonic stenosis. There is no evidence of pulmonic valve vegetation. Aorta: The aortic root is normal in size and structure. There is moderate (Grade III) plaque involving the descending aorta. IAS/Shunts: Evidence of atrial level shunting detected by color flow Doppler. Agitated saline contrast was given intravenously to evaluate for intracardiac shunting. Agitated saline contrast bubble study was negative, with no evidence of any interatrial shunt. A small patent foramen ovale is detected with predominantly left to right shunting across the atrial septum.  LEFT VENTRICLE PLAX 2D LVOT diam:     1.80 cm LVOT Area:     2.54 cm   AORTA Ao Root diam: 3.40 cm Ao Asc diam:  3.10 cm  SHUNTS Systemic Diam: 1.80 cm Jodelle Red MD Electronically signed by Jodelle Red MD Signature Date/Time: 01/25/2021/3:41:45 PM    Final         Scheduled Meds:  (feeding supplement) PROSource Plus  30 mL Oral BID  BM   amLODipine  5 mg Oral Daily   atorvastatin  40 mg Oral Daily   enoxaparin (LOVENOX) injection  40 mg Subcutaneous Q24H   fluticasone  1 spray Each Nare BID   fluticasone furoate-vilanterol  1 puff Inhalation Daily   gabapentin  600 mg Oral QHS   lidocaine  1 patch Transdermal Q24H   loratadine  10 mg Oral Daily   losartan  50 mg Oral Daily   montelukast  10 mg Oral QHS   oxyCODONE  15 mg Oral Q12H   polyethylene glycol  17 g Oral Daily   potassium chloride  40 mEq Oral BID   Ensure Max Protein  11 oz Oral Daily   senna-docusate  1 tablet Oral BID   traZODone  150 mg Oral QHS   venlafaxine XR  150 mg Oral q morning   Continuous Infusions:  sodium chloride 250 mL (01/21/21 1814)    ceFAZolin (ANCEF) IV 2 g (01/26/21 0522)   methocarbamol (ROBAXIN) IV 500 mg (01/22/21 0249)          Glade Lloyd, MD [facility] 01/26/2021, 8:00 AM

## 2021-01-26 NOTE — TOC Progression Note (Signed)
Transition of Care [facility]) - Progression Note    Patient Details  Name: Haley Singh MRN: [id number] Date of Birth: [birth year]  Transition of Care [facility]) CM/SW Contact  Amada Jupiter, LCSW Phone Number: 01/26/2021, 3:52 PM  Clinical Narrative:    Alerted by MD that pt will be ready for dc as soon as PICC line in place (likely tomorrow).  Have presented pt with facility bed offers and will follow up with her in the morning for decision.     Expected Discharge Plan: Skilled Nursing Facility Barriers to Discharge: Continued Medical Work up  Expected Discharge Plan and Services Expected Discharge Plan: Skilled Nursing Facility In-house Referral: Clinical Social Work Discharge Planning Services: CM Consult Post Acute Care Choice: Skilled Nursing Facility Living arrangements for the past 2 months: Single Family Home                                       Social Determinants of Health (SDOH) Interventions    Readmission Risk Interventions Readmission Risk Prevention Plan 01/22/2021  Medication Screening Complete  Transportation Screening Complete  Some recent data might be hidden

## 2021-01-26 NOTE — Anesthesia Postprocedure Evaluation (Signed)
Anesthesia Post Note  Patient: Haley Singh  Procedure(s) Performed: TRANSESOPHAGEAL ECHOCARDIOGRAM (TEE) BUBBLE STUDY     Patient location during evaluation: Endoscopy Anesthesia Type: MAC Level of consciousness: awake and alert Pain management: pain level controlled Vital Signs Assessment: post-procedure vital signs reviewed and stable Respiratory status: spontaneous breathing, nonlabored ventilation, respiratory function stable and patient connected to nasal cannula oxygen Cardiovascular status: stable and blood pressure returned to baseline Postop Assessment: no apparent nausea or vomiting Anesthetic complications: no   No notable events documented.  Last Vitals:  Vitals:   01/26/21 0830 01/26/21 1408  BP:  120/71  Pulse:  88  Resp:  18  Temp:  36.4 C  SpO2: 96% 99%    Last Pain:  Vitals:   01/26/21 1408  TempSrc: Oral  PainSc:    Pain Goal: Patients Stated Pain Goal: 1 (01/25/21 2011)                 [place]

## 2021-01-26 NOTE — Progress Notes (Signed)
[facility] for Infectious Disease  Date of Admission:  01/17/2021     CC: Mssa bacteremia Lumbar spine osteomyelitis  Lines:  Peripheral iv's   Abx: 8/18-c cefazolin   8/16-18 vanc                                                        Assessment: 70 yo female lumbago, OA, htn/hlp, asthma, admitted 8/16 with progressive 7 day acute on chronic lower back pain, found to have mri imaging L4-5 vertebral om/discitis and mssa bacteremia   8/17 & 8/19 bcx mssa; 8/22 bcx ngtd 8/17 IR aspirate (left L4-5 facet joint) cx ngtd   Source usually unclear on these patients. But so far no obvious metastatic site of infection. Presumably this is hematogenous spread of mssa bacteremia.   8/18 tte no obvious valve vegetation; 8/24 tee negative for endocarditis  8/22 Ct of abd/pelv showed worsening lumbar spine epidural abscess. Clinically no LE neurological deficit or bladder/bowel incontinence. Nsg reviewed cases and deemed no surgical intervention needed at this time  Overall ipmroving. Back pain minimal. LE spasm much improved. Pending disposition   Plan: Continue cefazolin iv for 6 weeks starting today 8/25 until 10/05 Will need repeat imaging lumbar spine in 3-4 weeks Likely will need extension of PO abx on 8/25 given epidural abscess Id f/u arranged Ok to discharge from id standpoint Please make sure if patient go to snf, she needs weekly cbc, cmp, and crp and faxed to [facility] clinic Discussed with primary team  OPAT Orders Discharge antibiotics to be given via PICC line Duration: 6 wks End Date: 10/5  [place] Per Protocol:  Home health RN for IV administration and teaching; PICC line care and labs.    Labs weekly while on IV antibiotics: _x_ CBC with differential __ BMP _x_ CMP _x_ CRP __ ESR __ Vancomycin trough __ CK  _x_ Please pull PIC at completion of IV antibiotics __ Please leave PIC in place until doctor has seen patient or been notified  Fax  weekly labs to [phone]  Clinic Follow Up Appt: 9/07 @ 330  @  [facility] [place], [place], [place] [address] Phone: [phone]    I spent more than 35 minute reviewing data/chart, and coordinating care and >50% direct face to face time providing counseling/discussing diagnostics/treatment plan with patient   Active Problems:   Osteomyelitis ([place])   Discitis of lumbar region   MSSA bacteremia   Allergies  Allergen Reactions   Oxycontin [Oxycodone] Itching   Penicillins Other (See Comments)    unk Did it involve swelling of the face/tongue/throat, SOB, or low BP? Unknown Did it involve sudden or severe rash/hives, skin peeling, or any reaction on the inside of your mouth or nose? Unknown Did you need to seek medical attention at a hospital or doctor's office? Unknown When did it last happen?     over 10 years ago  If all above answers are "NO", may proceed with cephalosporin use.    Tetracyclines & Related Itching    Scheduled Meds:  (feeding supplement) PROSource Plus  30 mL Oral BID BM   amLODipine  5 mg Oral Daily   atorvastatin  40 mg Oral Daily   enoxaparin (LOVENOX) injection  40 mg Subcutaneous  Q24H   fluticasone  1 spray Each Nare BID   fluticasone furoate-vilanterol  1 puff Inhalation Daily   gabapentin  600 mg Oral QHS   lidocaine  1 patch Transdermal Q24H   loratadine  10 mg Oral Daily   losartan  50 mg Oral Daily   montelukast  10 mg Oral QHS   oxyCODONE  15 mg Oral Q12H   polyethylene glycol  17 g Oral Daily   potassium chloride  40 mEq Oral BID   Ensure Max Protein  11 oz Oral Daily   senna-docusate  1 tablet Oral BID   traZODone  150 mg Oral QHS   venlafaxine XR  150 mg Oral q morning   Continuous Infusions:  sodium chloride 250 mL (01/21/21 1814)    ceFAZolin (ANCEF) IV 2 g (01/26/21 0522)   methocarbamol (ROBAXIN) IV 500 mg (01/22/21 0249)   PRN Meds:.sodium chloride, acetaminophen **OR** acetaminophen, albuterol,  bisacodyl, celecoxib, methocarbamol (ROBAXIN) IV, ondansetron **OR** ondansetron (ZOFRAN) IV, oxyCODONE-acetaminophen, SUMAtriptan   SUBJECTIVE: Had bm last 24 hours LE pain much better Tee no endocarditis No n/v/diarrhea/rash afebrile  Review of Systems: ROS All other ROS was negative, except mentioned above     OBJECTIVE: Vitals:   01/25/21 1045 01/25/21 1800 01/26/21 0459 01/26/21 0830  BP: 126/81 128/76 134/67   Pulse: 83 77 78   Resp: 15 18 17    Temp:  98.6 F (37 C) 98 F (36.7 C)   TempSrc:  Oral Oral   SpO2: 97% 99% 95% 96%  Weight:      Height:       Body mass index is 32.28 kg/m.  Physical Exam General/constitutional: no distress, pleasant HEENT: Normocephalic, PER, Conj Clear, EOMI, Oropharynx clear Neck supple CV: rrr no mrg Lungs: clear to auscultation, normal respiratory effort Abd: Soft, Nontender Ext: no edema Skin: No Rash Neuro: nonfocal MSK: no peripheral joint swelling/tenderness/warmth; back spines nontender   Central line presence: no     Lab Results Lab Results  Component Value Date   WBC 9.7 01/25/2021   HGB 9.4 (L) 01/25/2021   HCT 30.2 (L) 01/25/2021   MCV 91.8 01/25/2021   PLT 290 01/25/2021    Lab Results  Component Value Date   CREATININE 0.64 01/25/2021   BUN 12 01/25/2021   NA 137 01/25/2021   K 3.8 01/25/2021   CL 100 01/25/2021   CO2 29 01/25/2021    Lab Results  Component Value Date   ALT 19 01/25/2021   AST 36 01/25/2021   ALKPHOS 91 01/25/2021   BILITOT 0.6 01/25/2021      Microbiology: Recent Results (from the past 240 hour(s))  SARS CORONAVIRUS 2 (TAT 6-24 HRS) Nasopharyngeal Nasopharyngeal Swab     Status: None   Collection Time: 01/17/21  5:42 PM   Specimen: Nasopharyngeal Swab  Result Value Ref Range Status   SARS Coronavirus 2 NEGATIVE NEGATIVE Final    Comment: (NOTE) SARS-CoV-2 target nucleic acids are NOT DETECTED.  The SARS-CoV-2 RNA is generally detectable in upper and  lower respiratory specimens during the acute phase of infection. Negative results do not preclude SARS-CoV-2 infection, do not rule out co-infections with other pathogens, and should not be used as the sole basis for treatment or other patient management decisions. Negative results must be combined with clinical observations, patient history, and epidemiological information. The expected result is Negative.  Fact Sheet for Patients: SugarRoll.be  Fact Sheet for Healthcare Providers: https://www.woods-mathews.com/  This test is not yet approved or  cleared by the [place] and  has been authorized for detection and/or diagnosis of SARS-CoV-2 by FDA under an Emergency Use Authorization (EUA). This EUA will remain  in effect (meaning this test can be used) for the duration of the COVID-19 declaration under Se ction 564(b)(1) of the Act, 21 U.S.C. section 360bbb-3(b)(1), unless the authorization is terminated or revoked sooner.  Performed at [facility], [place] [address], [place], [place] [address]   Aerobic/Anaerobic Culture w Gram Stain (surgical/deep wound)     Status: None   Collection Time: 01/18/21  2:52 PM   Specimen: Abscess  Result Value Ref Range Status   Specimen Description   Final    ABSCESS VERTEBRA Performed at [facility] [address], [place], [place] [address]    Special Requests   Final    NONE Performed at [facility], [place] [address] [place], [place] [address]    Gram Stain   Final    RARE WBC PRESENT, PREDOMINANTLY MONONUCLEAR NO ORGANISMS SEEN    Culture   Final    No growth aerobically or anaerobically. Performed at [facility], [place] [address], [place], [place] [address]    Report Status 01/23/2021 FINAL  Final  Culture, blood (Routine X 2) w Reflex to ID Panel     Status: Abnormal   Collection Time: 01/18/21  5:08 PM   Specimen: BLOOD  Result  Value Ref Range Status   Specimen Description   Final    BLOOD LEFT ANTECUBITAL Performed at [facility], [place] [address], [place], [place] [address]    Special Requests   Final    BOTTLES DRAWN AEROBIC AND ANAEROBIC Blood Culture adequate volume Performed at [facility] [address], [place], [place] [address]    Culture  Setup Time   Final    GRAM POSITIVE COCCI IN CLUSTERS IN BOTH AEROBIC AND ANAEROBIC BOTTLES CRITICAL VALUE NOTED.  VALUE IS CONSISTENT WITH PREVIOUSLY REPORTED AND CALLED VALUE.    Culture (A)  Final    STAPHYLOCOCCUS AUREUS SUSCEPTIBILITIES PERFORMED ON PREVIOUS CULTURE WITHIN THE LAST 5 DAYS. Performed at [facility], [place] [address], [place], [place] [address]    Report Status 01/21/2021 FINAL  Final  Culture, blood (Routine X 2) w Reflex to ID Panel     Status: Abnormal   Collection Time: 01/18/21  5:08 PM   Specimen: BLOOD RIGHT HAND  Result Value Ref Range Status   Specimen Description   Final    BLOOD RIGHT HAND Performed at [facility] [address], [place], [place] [address]    Special Requests   Final    BOTTLES DRAWN AEROBIC AND ANAEROBIC Blood Culture adequate volume Performed at [facility] [address], [place], [place] [address]    Culture  Setup Time   Final    GRAM POSITIVE COCCI IN CLUSTERS IN BOTH AEROBIC AND ANAEROBIC BOTTLES CRITICAL RESULT CALLED TO, READ BACK BY AND VERIFIED WITH: PHARMD MARY [place] 01/19/2021 @1204  BY JW Performed at [facility], [place] [address], [place],  [address]    Culture STAPHYLOCOCCUS AUREUS (A)  Final   Report Status 01/21/2021 FINAL  Final   Organism ID, Bacteria STAPHYLOCOCCUS AUREUS  Final      Susceptibility   Staphylococcus aureus - MIC*    CIPROFLOXACIN >=8 RESISTANT Resistant     ERYTHROMYCIN <=0.25 SENSITIVE Sensitive  GENTAMICIN <=0.5 SENSITIVE Sensitive     OXACILLIN <=0.25 SENSITIVE Sensitive      TETRACYCLINE <=1 SENSITIVE Sensitive     VANCOMYCIN <=0.5 SENSITIVE Sensitive     TRIMETH/SULFA <=10 SENSITIVE Sensitive     CLINDAMYCIN <=0.25 SENSITIVE Sensitive     RIFAMPIN <=0.5 SENSITIVE Sensitive     Inducible Clindamycin NEGATIVE Sensitive     * STAPHYLOCOCCUS AUREUS  Blood Culture ID Panel (Reflexed)     Status: Abnormal   Collection Time: 01/18/21  5:08 PM  Result Value Ref Range Status   Enterococcus faecalis NOT DETECTED NOT DETECTED Final   Enterococcus Faecium NOT DETECTED NOT DETECTED Final   Listeria monocytogenes NOT DETECTED NOT DETECTED Final   Staphylococcus species DETECTED (A) NOT DETECTED Final    Comment: CRITICAL RESULT CALLED TO, READ BACK BY AND VERIFIED WITH: PHARMD MARY [place] 01/19/2021 @1204  BY JW    Staphylococcus aureus (BCID) DETECTED (A) NOT DETECTED Final    Comment: CRITICAL RESULT CALLED TO, READ BACK BY AND VERIFIED WITH: PHARMD MARY [place] 01/19/2021 @1204  BY JW    Staphylococcus epidermidis NOT DETECTED NOT DETECTED Final   Staphylococcus lugdunensis NOT DETECTED NOT DETECTED Final   Streptococcus species NOT DETECTED NOT DETECTED Final   Streptococcus agalactiae NOT DETECTED NOT DETECTED Final   Streptococcus pneumoniae NOT DETECTED NOT DETECTED Final   Streptococcus pyogenes NOT DETECTED NOT DETECTED Final   A.calcoaceticus-baumannii NOT DETECTED NOT DETECTED Final   Bacteroides fragilis NOT DETECTED NOT DETECTED Final   Enterobacterales NOT DETECTED NOT DETECTED Final   Enterobacter cloacae complex NOT DETECTED NOT DETECTED Final   Escherichia coli NOT DETECTED NOT DETECTED Final   Klebsiella aerogenes NOT DETECTED NOT DETECTED Final   Klebsiella oxytoca NOT DETECTED NOT DETECTED Final   Klebsiella pneumoniae NOT DETECTED NOT DETECTED Final   Proteus species NOT DETECTED NOT DETECTED Final   Salmonella species NOT DETECTED NOT DETECTED Final   Serratia marcescens NOT DETECTED NOT DETECTED Final   Haemophilus influenzae NOT DETECTED NOT  DETECTED Final   Neisseria meningitidis NOT DETECTED NOT DETECTED Final   Pseudomonas aeruginosa NOT DETECTED NOT DETECTED Final   Stenotrophomonas maltophilia NOT DETECTED NOT DETECTED Final   Candida albicans NOT DETECTED NOT DETECTED Final   Candida auris NOT DETECTED NOT DETECTED Final   Candida glabrata NOT DETECTED NOT DETECTED Final   Candida krusei NOT DETECTED NOT DETECTED Final   Candida parapsilosis NOT DETECTED NOT DETECTED Final   Candida tropicalis NOT DETECTED NOT DETECTED Final   Cryptococcus neoformans/gattii NOT DETECTED NOT DETECTED Final   Meth resistant mecA/C and MREJ NOT DETECTED NOT DETECTED Final    Comment: Performed at [facility], [address] [place], [place] [address]  Culture, blood (routine x 2)     Status: Abnormal   Collection Time: 01/20/21  4:26 AM   Specimen: BLOOD  Result Value Ref Range Status   Specimen Description   Final    BLOOD LEFT ANTECUBITAL Performed at [place] [address], [place], [place] [address]    Special Requests   Final    BOTTLES DRAWN AEROBIC AND ANAEROBIC Blood Culture adequate volume Performed at [facility] [address], [place], [place] [address]    Culture  Setup Time   Final    GRAM POSITIVE COCCI IN CLUSTERS ANAEROBIC BOTTLE ONLY CRITICAL VALUE NOTED.  VALUE IS CONSISTENT WITH PREVIOUSLY REPORTED AND CALLED VALUE.    Culture (A)  Final    STAPHYLOCOCCUS AUREUS  SUSCEPTIBILITIES PERFORMED ON PREVIOUS CULTURE WITHIN THE LAST 5 DAYS. Performed at [facility], [place] [address] [place], [place] [address]    Report Status 01/23/2021 FINAL  Final  Culture, blood (routine x 2)     Status: Abnormal   Collection Time: 01/20/21  4:32 AM   Specimen: BLOOD RIGHT HAND  Result Value Ref Range Status   Specimen Description   Final    BLOOD RIGHT HAND Performed at [facility] [address] [place], [place] [address]    Special Requests    Final    BOTTLES DRAWN AEROBIC ONLY Blood Culture adequate volume Performed at [facility] [address], [place], [place] [address]    Culture  Setup Time   Final    GRAM POSITIVE COCCI IN CLUSTERS AEROBIC BOTTLE ONLY CRITICAL VALUE NOTED.  VALUE IS CONSISTENT WITH PREVIOUSLY REPORTED AND CALLED VALUE.    Culture (A)  Final    STAPHYLOCOCCUS AUREUS SUSCEPTIBILITIES PERFORMED ON PREVIOUS CULTURE WITHIN THE LAST 5 DAYS. Performed at [facility], [place] [address], [place], [place] [address]    Report Status 01/24/2021 FINAL  Final  Culture, blood (Routine X 2) w Reflex to ID Panel     Status: None (Preliminary result)   Collection Time: 01/23/21 11:04 AM   Specimen: BLOOD  Result Value Ref Range Status   Specimen Description   Final    BLOOD BLOOD LEFT HAND Performed at [facility] [address] [place], [place] [address]    Special Requests   Final    BOTTLES DRAWN AEROBIC ONLY Blood Culture results may not be optimal due to an inadequate volume of blood received in culture bottles Performed at [facility] [address] [place], [place] [address]    Culture   Final    NO GROWTH 3 DAYS Performed at [facility], [place] [address], [place], [place] [address]    Report Status PENDING  Incomplete  Culture, blood (Routine X 2) w Reflex to ID Panel     Status: None (Preliminary result)   Collection Time: 01/23/21 11:05 AM   Specimen: BLOOD  Result Value Ref Range Status   Specimen Description   Final    BLOOD LEFT ANTECUBITAL Performed at [facility] [address] [place], [place] [address]    Special Requests   Final    BOTTLES DRAWN AEROBIC AND ANAEROBIC Blood Culture results may not be optimal due to an excessive volume of blood received in culture bottles Performed at [facility] [address] [place], [place] [address]    Culture   Final    NO GROWTH 3  DAYS Performed at [facility], [place] [address] [place], [place] [address]    Report Status PENDING  Incomplete     Serology:   Imaging: If present, new imagings (plain films, ct scans, and mri) have been personally visualized and interpreted; radiology reports have been reviewed. Decision making incorporated into the Impression / Recommendations.  8/16 mri lumbar spine 1. Increased T2 signal about and disruption of the left L4-L5 facets, with increased T2 signal in posterior L5 vertebral body, left pedicle, and surrounding soft tissues. Edema and osseous destruction cause mild spinal canal narrowing. This could represent acute fracture or infection. Correlate with history of trauma and consider CT for further osseous evaluation. 2. L2-L3 mild canal, moderate left neural foraminal narrowing and mild-to-moderate right neural  foraminal narrowing.  8/18 tte  1. Left ventricular ejection fraction, by estimation, is 60 to 65%. The  left ventricle has normal function. The left ventricle has no regional  wall motion abnormalities. There is moderate asymmetric left ventricular  hypertrophy of the basal-septal  segment. Left ventricular diastolic parameters were normal.   2. Right ventricular systolic function is normal. The right ventricular  size is normal. There is normal pulmonary artery systolic pressure. The  estimated right ventricular systolic pressure is 78.5 mmHg.   3. The mitral valve is normal in structure. Trivial mitral valve  regurgitation. No evidence of mitral stenosis.   4. The aortic valve was not well visualized. Aortic valve regurgitation  is not visualized. No aortic stenosis is present.   5. The inferior vena cava is normal in size with <50% respiratory  variability, suggesting right atrial pressure of 8 mmHg.   Conclusion(s)/Recommendation(s): No vegetation seen. If high clinical  suspicion for endocarditis, recommend TEE.   8/22 abdpelv ct with  contrast 1. Progressive osteomyelitis at the left L4-5 facet joint, with evidence of ascending infection and probable epidural abscess and paraspinal phlegmon from L1 through L4. Follow-up MRI lumbar spine with and without contrast is recommended for more detailed evaluation. 2. Bilateral nonobstructing renal calculi. No evidence of obstructive uropathy within either kidney. 3. Indeterminate hypodensities right kidney, possibly hyperdense cysts, slightly increased since prior study. Follow-up renal MRI or CT could be performed if not previously evaluated. 4. Minimal gallbladder sludge.  No cholelithiasis or cholecystitis. 5.  Aortic Atherosclerosis  8/24 tee CONCLUSION: No evidence of endocarditis. Small PFO with left to right flow but no right to left flow.  Jabier Mutton, [facility] [place] [phone] pager    01/26/2021, 11:13 AM

## 2021-01-26 NOTE — NC FL2 (Signed)
[place] MEDICAID FL2 LEVEL OF CARE SCREENING TOOL     IDENTIFICATION  Patient Name: Haley Singh Birthdate: January 25, 1951 Sex: female Admission Date (Current Location): 01/17/2021  [facility] and [place] Number:  Producer, television/film/video and Address:  [facility],  [address] [place], [place] [address]      Provider Number: [id number]  Attending Physician Name and Address:  Glade Lloyd, MD  Relative Name and Phone Number:  Zara Chess Clearence Cheek)   [phone]    Current Level of Care: Hospital Recommended Level of Care: Skilled Nursing Facility Prior Approval Number:    Date Approved/Denied:   PASRR Number: [id number]  Discharge Plan: SNF    Current Diagnoses: Patient Active Problem List   Diagnosis Date Noted   MSSA bacteremia    Discitis of lumbar region    Osteomyelitis (HCC) 01/17/2021   Mild persistent asthma without complication 05/17/2016   OSA (obstructive sleep apnea) 03/03/2014    Orientation RESPIRATION BLADDER Height & Weight     Self, Time, Situation, Place  Normal Continent Weight: 200 lb (90.7 kg) Height:  5\' 6"  (167.6 cm)  BEHAVIORAL SYMPTOMS/MOOD NEUROLOGICAL BOWEL NUTRITION STATUS      Continent Diet  AMBULATORY STATUS COMMUNICATION OF NEEDS Skin   Extensive Assist Verbally Normal, Other (Comment) (Blister on buttocks)                       Personal Care Assistance Level of Assistance  Bathing, Dressing, Feeding, Total care Bathing Assistance: Maximum assistance Feeding assistance: Independent Dressing Assistance: Limited assistance Total Care Assistance: Limited assistance   Functional Limitations Info  Sight, Hearing, Speech Sight Info: Adequate Hearing Info: Adequate Speech Info: Adequate    SPECIAL CARE FACTORS FREQUENCY  PT (By licensed PT), OT (By licensed OT)     PT Frequency: 5 times weekly OT Frequency: 5 times weekly            Contractures Contractures Info: Not present    Additional Factors Info    (and IV antibiotics) Code Status Info: FULL Allergies Info: Oxycontin, Penicillins Tetracyclines and related Psychotropic Info: gabapentin (NEURONTIN) capsule 600 mg, traZODone (DESYREL) tablet 150 mg         Current Medications (01/26/2021):  This is the current hospital active medication list Current Facility-Administered Medications  Medication Dose Route Frequency Provider Last Rate Last Admin   (feeding supplement) PROSource Plus liquid 30 mL  30 mL Oral BID BM 01/28/2021, MD   30 mL at 01/26/21 1030   0.9 %  sodium chloride infusion   Intravenous PRN 01/28/21, MD 10 mL/hr at 01/21/21 1814 250 mL at 01/21/21 1814   acetaminophen (TYLENOL) tablet 650 mg  650 mg Oral Q6H PRN 01/23/21, MD   650 mg at 01/26/21 1030   Or   acetaminophen (TYLENOL) suppository 650 mg  650 mg Rectal Q6H PRN 01/28/21, MD       albuterol (PROVENTIL) (2.5 MG/3ML) 0.083% nebulizer solution 2.5 mg  2.5 mg Inhalation Q6H PRN Jodelle Red, MD       amLODipine (NORVASC) tablet 5 mg  5 mg Oral Daily Jodelle Red, MD   5 mg at 01/26/21 1030   atorvastatin (LIPITOR) tablet 40 mg  40 mg Oral Daily 01/28/21, MD   40 mg at 01/26/21 1030   bisacodyl (DULCOLAX) EC tablet 5 mg  5 mg Oral Daily PRN 01/28/21, MD   5 mg at 01/24/21 1618   ceFAZolin (ANCEF) IVPB 2g/100 mL  premix  2 g Intravenous Q8H Jodelle Red, MD 200 mL/hr at 01/26/21 0522 2 g at 01/26/21 0522   celecoxib (CELEBREX) capsule 200-400 mg  200-400 mg Oral Daily PRN Jodelle Red, MD   200 mg at 01/26/21 1030   enoxaparin (LOVENOX) injection 40 mg  40 mg Subcutaneous Q24H Jodelle Red, MD   40 mg at 01/25/21 2205   fluticasone (FLONASE) 50 MCG/ACT nasal spray 1 spray  1 spray Each Nare BID Jodelle Red, MD   1 spray at 01/26/21 1035   fluticasone furoate-vilanterol (BREO ELLIPTA) 100-25 MCG/INH 1 puff  1 puff Inhalation Daily  Jodelle Red, MD   1 puff at 01/26/21 0828   gabapentin (NEURONTIN) capsule 600 mg  600 mg Oral QHS Jodelle Red, MD   600 mg at 01/25/21 2203   lidocaine (LIDODERM) 5 % 1 patch  1 patch Transdermal Q24H Jodelle Red, MD   1 patch at 01/25/21 1558   loratadine (CLARITIN) tablet 10 mg  10 mg Oral Daily Jodelle Red, MD   10 mg at 01/26/21 1031   losartan (COZAAR) tablet 50 mg  50 mg Oral Daily Jodelle Red, MD   50 mg at 01/26/21 1031   methocarbamol (ROBAXIN) 500 mg in dextrose 5 % 50 mL IVPB  500 mg Intravenous Q6H PRN Jodelle Red, MD 100 mL/hr at 01/22/21 0249 500 mg at 01/22/21 0249   montelukast (SINGULAIR) tablet 10 mg  10 mg Oral QHS Jodelle Red, MD   10 mg at 01/25/21 2204   ondansetron (ZOFRAN) tablet 4 mg  4 mg Oral Q6H PRN Jodelle Red, MD   4 mg at 01/23/21 1511   Or   ondansetron (ZOFRAN) injection 4 mg  4 mg Intravenous Q6H PRN Jodelle Red, MD   4 mg at 01/22/21 2053   oxyCODONE (OXYCONTIN) 12 hr tablet 15 mg  15 mg Oral Q12H Jodelle Red, MD   15 mg at 01/26/21 1031   oxyCODONE-acetaminophen (PERCOCET/ROXICET) 5-325 MG per tablet 1-2 tablet  1-2 tablet Oral Q4H PRN Jodelle Red, MD   1 tablet at 01/25/21 2011   polyethylene glycol (MIRALAX / GLYCOLAX) packet 17 g  17 g Oral Daily Jodelle Red, MD   17 g at 01/25/21 1121   potassium chloride SA (KLOR-CON) CR tablet 40 mEq  40 mEq Oral BID Jodelle Red, MD   40 mEq at 01/26/21 1030   protein supplement (ENSURE MAX) liquid  11 oz Oral Daily Jodelle Red, MD   11 oz at 01/26/21 1035   senna-docusate (Senokot-S) tablet 1 tablet  1 tablet Oral BID Jodelle Red, MD   1 tablet at 01/25/21 1120   SUMAtriptan (IMITREX) tablet 50 mg  50 mg Oral Daily PRN Jodelle Red, MD       traZODone (DESYREL) tablet 150 mg  150 mg Oral QHS Jodelle Red, MD   150 mg at 01/25/21 2204    venlafaxine XR (EFFEXOR-XR) 24 hr capsule 150 mg  150 mg Oral q morning Jodelle Red, MD   150 mg at 01/26/21 1035     Discharge Medications: Please see discharge summary for a list of discharge medications.  Relevant Imaging Results:  Relevant Lab Results:   Additional Information SSN: [id number];  IV antibiotics needed; COVID vaccines x 3  Isaiah Cianci, LCSW

## 2021-01-26 NOTE — Progress Notes (Signed)
Occupational Therapy Progress Note  Patient agreeable to transfer training this session. Provided visual demonstration for hand placement/proper body mechanics to squat pivot onto bedside commode. Patient verbalize understanding and able to return demonstrate with min A x2 for safety. Attempted to void on commode however was unable. Instruct patient in hand placement for pivot back to chair with min x2 to safety. Continue to recommend rehab at discharge as patient still needing significant assist for self care tasks and external assist/support for functional transfers. Acute OT to follow.    01/26/21 1300  OT Visit Information  Last OT Received On 01/26/21  Assistance Needed +2  History of Present Illness Pt is 70 y.o. female who presented to ED with sudden onset of severe back pain, septic arthritis suspected?Marland Kitchen PMH significant for chronic back pain, OA, HTN, HLD, asthma, depression, anxiety, insomnia, and R ankle ORIF (02/2007).  Precautions  Precautions Fall  Precaution Comments LSO brace for comfort for OOB activity  Pain Assessment  Pain Assessment Faces  Faces Pain Scale 2  Pain Location thigh  Pain Descriptors / Indicators Shooting;Radiating  Pain Intervention(s) Monitored during session  Cognition  Arousal/Alertness Awake/alert  Behavior During Therapy WFL for tasks assessed/performed  Overall Cognitive Status Within Functional Limits for tasks assessed  ADL  Overall ADL's  Needs assistance/impaired  Toilet Transfer Minimal assistance;+2 for safety/equipment;Stand-pivot;Cueing for safety;Cueing for sequencing  Toileting - Clothing Manipulation Details (indicate cue type and reason) attempted to void but was unable.  General ADL Comments today's session focusing on transfer training. provided visual demo for safe technique to perform squat pivot onto 3 in 1. instruct patient to reach L arm for far commode handle in order to have hips pivoted more towards seat. patient verbalize  understanding and able to return demo with min A x2 for safety. instruct patient how to perform with similar technique back to recliner chair.  Bed Mobility  General bed mobility comments in recliner  Balance  Overall balance assessment Needs assistance  Sitting-balance support Feet supported  Sitting balance-Leahy Scale Good  Standing balance support Bilateral upper extremity supported  Standing balance-Leahy Scale Poor  Standing balance comment reliant on UE support and external assist for pivot transfers  Transfers  Overall transfer level Needs assistance  Equipment used None  Transfers Squat Pivot Transfers  Squat pivot transfers Min assist;+2 safety/equipment  General transfer comment please see ADL section  OT - End of Session  Equipment Utilized During Treatment Gait belt  Activity Tolerance Patient tolerated treatment well  Patient left in chair;with call bell/phone within reach  Nurse Communication Mobility status  OT Assessment/Plan  OT Plan Discharge plan remains appropriate  OT Visit Diagnosis Unsteadiness on feet (R26.81);Muscle weakness (generalized) (M62.81);Pain  Pain - part of body Leg  OT Frequency (ACUTE ONLY) Min 2X/week  Follow Up Recommendations SNF  OT Equipment Other (comment) (defer next venue)  AM-PAC OT "6 Clicks" Daily Activity Outcome Measure (Version 2)  Help from another person eating meals? 4  Help from another person taking care of personal grooming? 3  Help from another person toileting, which includes using toliet, bedpan, or urinal? 2  Help from another person bathing (including washing, rinsing, drying)? 2  Help from another person to put on and taking off regular upper body clothing? 3  Help from another person to put on and taking off regular lower body clothing? 2  6 Click Score 16  Progressive Mobility  What is the highest level of mobility based on the progressive mobility assessment?  Level 3 (Stands with assist) - Balance while standing   and cannot march in place  Mobility Out of bed for toileting  OT Goal Progression  Progress towards OT goals Progressing toward goals  Acute Rehab OT Goals  Patient Stated Goal have less pain  OT Goal Formulation With patient  Time For Goal Achievement 02/03/21  Potential to Achieve Goals Good  ADL Goals  Pt Will Transfer to Toilet with min assist;bedside commode  Additional ADL Goal #1 patient to engage in ADL task sitting on edge of bed with pain report of 5/10 or less  OT Time Calculation  OT Start Time (ACUTE ONLY) 1313  OT Stop Time (ACUTE ONLY) 1338  OT Time Calculation (min) 25 min  OT General Charges  $OT Visit 1 Visit  OT Treatments  $Self Care/Home Management  23-37 mins   Marlyce Huge OT OT pager: [phone]

## 2021-01-26 NOTE — Progress Notes (Addendum)
Physical Therapy Treatment Patient Details Name: Haley Singh MRN: [id number] DOB: 1951-01-29 Today's Date: 01/26/2021    History of Present Illness Pt is 70 y.o. female who presented to ED with sudden onset of severe back pain, septic arthritis suspected?Marland Kitchen PMH significant for chronic back pain, OA, HTN, HLD, asthma, depression, anxiety, insomnia, and R ankle ORIF (02/2007).    PT Comments    Pt progressing but slowly.  General Comments: AxO x 3 pleasant/motivated was highly Indep and working part time prior to admit.  Pt was OOB in recliner.  General transfer comment: pt progressing with increased ability to rise but still requires much assist esp during hand transfer from recliner to AD as pt uses MAX B UE support to perform sit to stand and stand to sit.  HEAVY use B UE's for support due to B LE weakness/pain.  General Gait Details: using B Platform EVA walker performed Pre Gait marching with slight increase ability to weight shift and perform hips flex with "tolerable" pain.  Pt at partial upright posture unable to fully erect duew to fear od increasing pain/spasms.  Tolerated 90 seconds.   Then performed some B LE TE's in seated position.  Pt progressing slowly. Pt will need ST Rehab at SNF prior to returning home.    Follow Up Recommendations  SNF     Equipment Recommendations       Recommendations for Other Services       Precautions / Restrictions Precautions Precautions: Fall Precaution Comments: LSO brace for comfort for OOB activity    Mobility  Bed Mobility               General bed mobility comments: OOB in recliner    Transfers Overall transfer level: Needs assistance Equipment used: Bilateral platform walker (EVA walker) Transfers: Sit to/from Stand Sit to Stand: +2 physical assistance;Mod assist;Min assist         General transfer comment: pt progressing with increased ability to rise but still requires much assist esp during hand transfer from  recliner to AD as pt uses MAX B UE support to perform sit to stand and stand to sit.  HEAVY use B UE's for support due to B LE weakness/pain  Ambulation/Gait Ambulation/Gait assistance: Max assist;+2 physical assistance;+2 safety/equipment Gait Distance (Feet): 1 Feet Assistive device: Bilateral platform walker Gait Pattern/deviations: Step-to pattern Gait velocity: decreased   General Gait Details: using B Platform EVA walker performed Pre Gait marching with slight increase ability to weight shift and perform hips flex with "tolerable" pain.  Pt at partial upright posture unable to fully erect duew to fear od increasing pain/spasms.  Tolerated 90 seconds.  Pt progressing slowly.   Stairs             Wheelchair Mobility    Modified Rankin (Stroke Patients Only)       Balance                                            Cognition Arousal/Alertness: Awake/alert Behavior During Therapy: WFL for tasks assessed/performed Overall Cognitive Status: Within Functional Limits for tasks assessed                                 General Comments: AxO x 3 pleasant/motivated was highly Indep and working part time prior to  admit      Exercises   B LE TE's 10 reps LAQ's with 10 sec hold Seated marching x 30 reps    General Comments        Pertinent Vitals/Pain Pain Assessment: 0-10 Pain Score: 6  Pain Location: low back which she describes "shoots" down to BOTH frontal upper thighs/graoin Pain Descriptors / Indicators: Sharp;Grimacing Pain Intervention(s): Premedicated before session;Repositioned    Home Living                      Prior Function            PT Goals (current goals can now be found in the care plan section) Progress towards PT goals: Progressing toward goals    Frequency    Min 2X/week      PT Plan Current plan remains appropriate    Co-evaluation              AM-PAC PT "6 Clicks" Mobility    Outcome Measure  Help needed turning from your back to your side while in a flat bed without using bedrails?: A Lot Help needed moving from lying on your back to sitting on the side of a flat bed without using bedrails?: A Lot Help needed moving to and from a bed to a chair (including a wheelchair)?: A Lot Help needed standing up from a chair using your arms (e.g., wheelchair or bedside chair)?: A Lot Help needed to walk in hospital room?: A Lot Help needed climbing 3-5 steps with a railing? : Total 6 Click Score: 11    End of Session Equipment Utilized During Treatment: Gait belt;Back brace Activity Tolerance: Patient limited by pain Patient left: in chair;with call bell/phone within reach;with chair alarm set;Other (comment) Nurse Communication: Mobility status PT Visit Diagnosis: Unsteadiness on feet (R26.81);Difficulty in walking, not elsewhere classified (R26.2);Pain Pain - Right/Left: Left Pain - part of body: Leg;Hip     Time: 1130-1155 PT Time Calculation (min) (ACUTE ONLY): 25 min  Charges:  $Gait Training: 8-22 mins $Therapeutic Exercise: 8-22 mins                     Felecia Shelling  PTA Acute  Rehabilitation Services Pager      [phone] Office      [phone]

## 2021-01-26 NOTE — Progress Notes (Signed)
Primary RN made aware PICC will be done tomorrow 01/27/21.

## 2021-01-26 NOTE — Progress Notes (Signed)
PHARMACY CONSULT NOTE FOR:  OUTPATIENT  PARENTERAL ANTIBIOTIC THERAPY (OPAT)  Indication: Vertebral Osteomyelitis Regimen: Cefazolin 2g Q8H  End date: 03/09/2021  IV antibiotic discharge orders are pended. To discharging provider:  please sign these orders via discharge navigator,  Select New Orders & click on the button choice - Manage This Unsigned Work.     Thank you for allowing pharmacy to be a part of this patient's care.  Christel Mormon 01/26/2021, 11:01 AM

## 2021-01-27 ENCOUNTER — Encounter (HOSPITAL_COMMUNITY): Payer: Self-pay | Admitting: Cardiology

## 2021-01-27 DIAGNOSIS — R7881 Bacteremia: Secondary | ICD-10-CM | POA: Diagnosis not present

## 2021-01-27 DIAGNOSIS — M4646 Discitis, unspecified, lumbar region: Secondary | ICD-10-CM | POA: Diagnosis not present

## 2021-01-27 DIAGNOSIS — M868X8 Other osteomyelitis, other site: Secondary | ICD-10-CM | POA: Diagnosis not present

## 2021-01-27 DIAGNOSIS — B9561 Methicillin susceptible Staphylococcus aureus infection as the cause of diseases classified elsewhere: Secondary | ICD-10-CM | POA: Diagnosis not present

## 2021-01-27 LAB — C-REACTIVE PROTEIN: CRP: 9 mg/dL — ABNORMAL HIGH (ref <1.0)

## 2021-01-27 LAB — RESP PANEL BY RT-PCR (FLU A&B, COVID) ARPGX2
Influenza A by PCR: NEGATIVE
Influenza B by PCR: NEGATIVE
SARS Coronavirus 2 by RT PCR: NEGATIVE

## 2021-01-27 MED ORDER — OXYCODONE-ACETAMINOPHEN 5-325 MG PO TABS: 1.0000 | ORAL_TABLET | Freq: Four times a day (QID) | ORAL | 0 refills | Status: DC | PRN

## 2021-01-27 MED ORDER — SENNOSIDES-DOCUSATE SODIUM 8.6-50 MG PO TABS: 1.0000 | ORAL_TABLET | Freq: Two times a day (BID) | ORAL | 0 refills | Status: DC

## 2021-01-27 MED ORDER — LIDOCAINE 5 % EX PTCH: 1.0000 | MEDICATED_PATCH | CUTANEOUS | 0 refills | Status: DC

## 2021-01-27 MED ORDER — POLYETHYLENE GLYCOL 3350 17 G PO PACK
17.0000 g | PACK | Freq: Every day | ORAL | 0 refills | Status: DC | PRN
Start: 2021-01-27 — End: 2021-09-05

## 2021-01-27 MED ORDER — OXYCODONE HCL ER 15 MG PO T12A: 15.0000 mg | EXTENDED_RELEASE_TABLET | Freq: Two times a day (BID) | ORAL | 0 refills | Status: DC

## 2021-01-27 MED ORDER — SODIUM CHLORIDE 0.9% FLUSH: 10.0000 mL | INTRAVENOUS | Status: DC | PRN

## 2021-01-27 MED ORDER — CHLORHEXIDINE GLUCONATE CLOTH 2 % EX PADS
6.0000 | MEDICATED_PAD | Freq: Every day | CUTANEOUS | Status: DC
  Administered 2021-01-27: 6 via TOPICAL

## 2021-01-27 MED ORDER — CEFAZOLIN IV (FOR PTA / DISCHARGE USE ONLY): 2.0000 g | Freq: Three times a day (TID) | INTRAVENOUS | 0 refills | Status: DC

## 2021-01-27 MED ORDER — METHOCARBAMOL 500 MG PO TABS: 500.0000 mg | ORAL_TABLET | Freq: Four times a day (QID) | ORAL | 0 refills | Status: DC | PRN

## 2021-01-27 MED ORDER — COVID-19 MRNA VAC-TRIS(PFIZER) 30 MCG/0.3ML IM SUSP
0.3000 mL | Freq: Once | INTRAMUSCULAR | Status: AC
  Administered 2021-01-27: 0.3 mL via INTRAMUSCULAR
  Filled 2021-01-27: qty 0.3

## 2021-01-27 NOTE — Care Management Important Message (Signed)
Medicare IM printed for [employer] to give to the patient. 

## 2021-01-27 NOTE — Progress Notes (Signed)
Peripherally Inserted Central Catheter Placement  The IV Nurse has discussed with the patient and/or persons authorized to consent for the patient, the purpose of this procedure and the potential benefits and risks involved with this procedure.  The benefits include less needle sticks, lab draws from the catheter, and the patient may be discharged home with the catheter. Risks include, but not limited to, infection, bleeding, blood clot (thrombus formation), and puncture of an artery; nerve damage and irregular heartbeat and possibility to perform a PICC exchange if needed/ordered by physician.  Alternatives to this procedure were also discussed.  Bard Power PICC patient education guide, fact sheet on infection prevention and patient information card has been provided to patient /or left at bedside.    PICC Placement Documentation  PICC Single Lumen 01/27/21 Right Basilic 39 cm 0 cm (Active)  Indication for Insertion or Continuance of Line Home intravenous therapies (PICC only) 01/27/21 0922  Exposed Catheter (cm) 0 cm 01/27/21 1497  Site Assessment Clean;Dry;Intact 01/27/21 0263  Line Status Flushed;Saline locked;Blood return noted 01/27/21 7858  Dressing Type Transparent;Securing device 01/27/21 8502  Dressing Status Clean;Dry;Intact 01/27/21 0922  Antimicrobial disc in place? Yes 01/27/21 0922  Safety Lock Not Applicable 01/27/21 0922  Dressing Change Due 02/03/21 01/27/21 7741       Romie Jumper 01/27/2021, 9:27 AM

## 2021-01-27 NOTE — Progress Notes (Signed)
Physical Therapy Treatment Patient Details Name: Haley Singh MRN: [id number] DOB: 01/23/1951 Today's Date: 01/27/2021    History of Present Illness Pt is 70 y.o. female who presented to ED with sudden onset of severe back pain, septic arthritis suspected?Marland Kitchen PMH significant for chronic back pain, OA, HTN, HLD, asthma, depression, anxiety, insomnia, and R ankle ORIF (02/2007).    PT Comments    Pt OOB in recliner.  General transfer comment: from recliner with 50% VC's to push off vs pull up on walker.  Pt has great difficult transitioning hands from recliner to walker due to excessive lean/rreliance of B UE's . General Gait Details: Using B platform EVA walker for increased support.  Pt was able to amb in hallway with heavy lean on walker B UE's (elbows) on B platform pads.  Pain was "tolerable" along with MAX c/o B LE weakness and "tight" hamstrings.  Pt was able to advance gait distance to a tolatl of 32 feet.  Did require one seated rest break about half way. Pt will need ST Rehab prior to returning home.    Follow Up Recommendations  SNF     Equipment Recommendations       Recommendations for Other Services       Precautions / Restrictions Precautions Precautions: Fall Precaution Comments: LSO brace for comfort for OOB activity    Mobility  Bed Mobility               General bed mobility comments: OOB in recliner    Transfers Overall transfer level: Needs assistance Equipment used: None   Sit to Stand: +2 physical assistance;Mod assist;Min assist         General transfer comment: from recliner with 50% VC's to push off vs pull up on walker.  Pt has great difficult transitioning hands from recliner to walker due to excessive lean/rreliance of B UE's .  Ambulation/Gait Ambulation/Gait assistance: Max assist;+2 physical assistance;+2 safety/equipment Gait Distance (Feet): 32 Feet Assistive device: Bilateral platform walker Gait Pattern/deviations: Step-to  pattern Gait velocity: decreased   General Gait Details: Using B platform EVA walker for increased support.  Pt was able to amb in hallway with heavy lean on walker B UE's (elbows) on B platform pads.  Pain was "tolerable" along with MAX c/o B LE weakness and "tight" hamstrings.  Pt was able to advance gait distance to a tolatl of 32 feet.  Did require one seated rest break about half way.   Stairs             Wheelchair Mobility    Modified Rankin (Stroke Patients Only)       Balance                                            Cognition Arousal/Alertness: Awake/alert Behavior During Therapy: WFL for tasks assessed/performed                                   General Comments: AxO x 3 pleasant/motivated was highly Indep and working part time prior to admit      Exercises      General Comments        Pertinent Vitals/Pain Pain Assessment: 0-10 Pain Score: 7  Pain Location: R thigh Pain Descriptors / Indicators: Shooting;Radiating Pain Intervention(s): Monitored during session;Repositioned;Premedicated  before session    Home Living                      Prior Function            PT Goals (current goals can now be found in the care plan section) Progress towards PT goals: Progressing toward goals    Frequency    Min 2X/week      PT Plan Current plan remains appropriate    Co-evaluation              AM-PAC PT "6 Clicks" Mobility   Outcome Measure  Help needed turning from your back to your side while in a flat bed without using bedrails?: A Lot Help needed moving from lying on your back to sitting on the side of a flat bed without using bedrails?: A Lot Help needed moving to and from a bed to a chair (including a wheelchair)?: A Lot Help needed standing up from a chair using your arms (e.g., wheelchair or bedside chair)?: A Lot Help needed to walk in hospital room?: A Lot Help needed climbing 3-5  steps with a railing? : Total 6 Click Score: 11    End of Session Equipment Utilized During Treatment: Gait belt;Back brace Activity Tolerance: Patient limited by fatigue Patient left: in chair;with call bell/phone within reach;with chair alarm set;Other (comment) Nurse Communication: Mobility status PT Visit Diagnosis: Unsteadiness on feet (R26.81);Difficulty in walking, not elsewhere classified (R26.2);Pain Pain - Right/Left: Left Pain - part of body: Leg;Hip     Time: 7588-3254 PT Time Calculation (min) (ACUTE ONLY): 25 min  Charges:  $Gait Training: 8-22 mins $Therapeutic Activity: 8-22 mins                  Felecia Shelling  PTA Acute  Rehabilitation Services Pager      [phone] Office      [phone]

## 2021-01-27 NOTE — Discharge Summary (Addendum)
Physician Discharge Summary  Haley Singh ZOX:[id number] DOB: Jun 11, 1950 DOA: 01/17/2021  PCP: Katherina Mires, MD  Admit date: 01/17/2021 Discharge date: 01/27/2021  Admitted From: Home Disposition: SNF  Recommendations for Outpatient Follow-up:  Follow up with SNF provider at earliest convenience with weekly CBC/CMP/CRP Outpatient follow-up with ID/Dr. Gale Journey.  Please fax weekly labs to [facility] Outpatient follow-up with neurosurgery if needed Follow up in ED if symptoms worsen or new appear   Home Health: No Equipment/Devices: Right upper extremity PICC line placed on 01/27/2021  Discharge Condition: Stable CODE STATUS: Full Diet recommendation: Heart healthy  Brief/Interim Summary: 70 year old female with history of asthma, hypertension, depression, hyperlipidemia, bilateral renal stones requiring double-J stents along with lithotripsy, small bowel obstruction requiring laparotomy with bowel resection and primary anastomosis in 2005 presented with worsening severe back pain.  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  Patient underwent left L4-5 facet joint aspirate by IR on 01/18/2021.  ID was consulted.  Neurosurgery recommended conservative management.  Antibiotics currently have been changed to cefazolin. TEE was negative for vegetations.  PICC line was placed today.  ID recommends to continue Ancef for 6 weeks till 03/08/2021 with outpatient follow-up with ID; ID has cleared the patient for discharge.  She will be discharged to SNF once bed is available.    Discharge Diagnoses:  Sepsis: Present on admission MSSA bacteremia L4-L5 vertebral osteomyelitis/discitis -ID following.  Currently on cefazolin. -There was a question of possible epidural abscess on CT: This was reviewed by neurosurgery/Dr. Kathyrn Sheriff on 01/24/2021 who thought that patient does not have epidural abscess and recommended to continue conservative management with  antibiotics.  Outpatient follow-up with neurosurgery as needed -TEE was negative for vegetations. -Repeat cultures from 01/23/2021 have been negative so far. D recommends to continue Ancef for 6 weeks till 03/08/2021 with outpatient follow-up with ID; ID has cleared the patient for discharge. Outpatient follow-up with ID/Dr. Gale Journey.  Please fax weekly labs including CBC/CMP/CRP to [facility] -Continue pain management.  Despite documented OxyContin allergy of itching, patient has tolerated OxyContin during this hospitalization and can be safely continued upon discharge.  PT recommending SNF placement.     Hypertension -Continue amlodipine and losartan   History of migraines -Continue Imitrex as needed   Depression -Continue trazodone and Effexor   Neuropathy -Continue gabapentin  Hyperlipidemia -Continue statin  Asthma  -stable.  Continue montelukast and inhalers.   Discharge Instructions  Discharge Instructions     [facility] pharmacist to adjust dose for Vancomycin, Aminoglycosides and other anti-infective therapies as requested by physician.   Complete by: As directed    [facility] to provide Cath Flo 32m   Complete by: As directed    Administer for PICC line occlusion and as ordered by physician for other access device issues.   Ambulatory referral to Infectious Disease   Complete by: As directed    Anaphylaxis Kit: Provided to treat any anaphylactic reaction to the medication being provided to the patient if First Dose or when requested by physician   Complete by: As directed    Epinephrine 134mml vial / amp: Administer 0.10m88m0.10ml510mubcutaneously once for moderate to severe anaphylaxis, nurse to call physician and pharmacy when reaction occurs and call 911 if needed for immediate care   Diphenhydramine 50mg10mIV vial: Administer 25-50mg 60mM PRN for first dose reaction, rash, itching, mild reaction, nurse to call physician and pharmacy when reaction  occurs   Sodium Chloride 0.9% NS  572m IV: Administer if needed for hypovolemic blood pressure drop or as ordered by physician after call to physician with anaphylactic reaction   Change dressing on IV access line weekly and PRN   Complete by: As directed    Diet - low sodium heart healthy   Complete by: As directed    Flush IV access with Sodium Chloride 0.9% and Heparin 10 units/ml or 100 units/ml   Complete by: As directed    Home infusion instructions - [facility]   Complete by: As directed    Instructions: Flush IV access with Sodium Chloride 0.9% and Heparin 10units/ml or 100units/ml   Change dressing on IV access line: Weekly and PRN   Instructions Cath Flo 262m Administer for PICC Line occlusion and as ordered by physician for other access device   [facility] pharmacist to adjust dose for: Vancomycin, Aminoglycosides and other anti-infective therapies as requested by physician   Increase activity slowly   Complete by: As directed    Method of administration may be changed at the discretion of home infusion pharmacist based upon assessment of the patient and/or caregiver's ability to self-administer the medication ordered   Complete by: As directed       Allergies as of 01/27/2021       Reactions   Oxycontin [oxycodone] Itching   Penicillins Other (See Comments)   unk Did it involve swelling of the face/tongue/throat, SOB, or low BP? Unknown Did it involve sudden or severe rash/hives, skin peeling, or any reaction on the inside of your mouth or nose? Unknown Did you need to seek medical attention at a hospital or doctor's office? Unknown When did it last happen?     over 10 years ago  If all above answers are "NO", may proceed with cephalosporin use.   Tetracyclines & Related Itching        Medication List     STOP taking these medications    ondansetron 8 MG disintegrating tablet Commonly known as: Zofran ODT   oxybutynin 5 MG  tablet Commonly known as: DITROPAN       TAKE these medications    albuterol 108 (90 Base) MCG/ACT inhaler Commonly known as: VENTOLIN HFA Inhale 1 puff into the lungs every 6 (six) hours as needed for wheezing or shortness of breath.   amLODipine 5 MG tablet Commonly known as: NORVASC Take 5 mg by mouth daily.   atorvastatin 40 MG tablet Commonly known as: LIPITOR Take 40 mg by mouth daily.   Breo Ellipta 100-25 MCG/INH Aepb Generic drug: fluticasone furoate-vilanterol INHALE 1 PUFFS INTO THE LUNGS ONCE DAILY What changed: See the new instructions.   ceFAZolin  IVPB Commonly known as: ANCEF Inject 2 g into the vein every 8 (eight) hours. Indication:  Osteomyelitis First Dose: No Last Day of Therapy:  03/09/2021 Labs - Once weekly:  CBC/D and BMP, Labs - Every other week:  ESR and CRP Method of administration: IV Push Method of administration may be changed at the discretion of home infusion pharmacist based upon assessment of the patient and/or caregiver's ability to self-administer the medication ordered.   celecoxib 200 MG capsule Commonly known as: CELEBREX Take 200-400 mg by mouth daily as needed for mild pain.   cetirizine 10 MG tablet Commonly known as: ZYRTEC Take 10 mg by mouth daily as needed for allergies.   fluticasone 50 MCG/ACT nasal spray Commonly known as: FLONASE Place 1 spray into both nostrils 2 (two) times daily.   gabapentin  300 MG capsule Commonly known as: NEURONTIN Take 600 mg by mouth at bedtime.   lidocaine 5 % Commonly known as: LIDODERM Place 1 patch onto the skin daily. Remove & Discard patch within 12 hours or as directed by MD   losartan 50 MG tablet Commonly known as: COZAAR Take 50 mg by mouth daily.   methocarbamol 500 MG tablet Commonly known as: Robaxin Take 1 tablet (500 mg total) by mouth every 6 (six) hours as needed for muscle spasms.   montelukast 10 MG tablet Commonly known as: SINGULAIR Take 10 mg by mouth  daily.   oxyCODONE 15 mg 12 hr tablet Commonly known as: OXYCONTIN Take 1 tablet (15 mg total) by mouth every 12 (twelve) hours.   oxyCODONE-acetaminophen 5-325 MG tablet Commonly known as: PERCOCET/ROXICET Take 1 tablet by mouth every 6 (six) hours as needed for severe pain.   polyethylene glycol 17 g packet Commonly known as: MIRALAX / GLYCOLAX Take 17 g by mouth daily as needed.   senna-docusate 8.6-50 MG tablet Commonly known as: Senokot-S Take 1 tablet by mouth 2 (two) times daily.   SUMAtriptan 50 MG tablet Commonly known as: IMITREX Take 50 mg by mouth daily as needed for migraine. May repeat 1 dose in an hour.   traZODone 150 MG tablet Commonly known as: DESYREL Take 150 mg by mouth at bedtime.   venlafaxine XR 150 MG 24 hr capsule Commonly known as: EFFEXOR-XR Take 150 mg by mouth every morning.               Discharge Care Instructions  (From admission, onward)           Start     Ordered   01/27/21 0000  Change dressing on IV access line weekly and PRN  (Home infusion instructions - [facility] )        01/27/21 0953            Follow-up Information     Katherina Mires, MD. Schedule an appointment as soon as possible for a visit in 1 week(s).   Specialty: Family Medicine Contact information: [place] [address] [phone]         Consuella Lose, MD. Schedule an appointment as soon as possible for a visit in 1 week(s).   Specialty: Neurosurgery Contact information: [address] [place] [address] [phone]                Allergies  Allergen Reactions   Oxycontin [Oxycodone] Itching   Penicillins Other (See Comments)    unk Did it involve swelling of the face/tongue/throat, SOB, or low BP? Unknown Did it involve sudden or severe rash/hives, skin peeling, or any reaction on the inside of your mouth or nose? Unknown Did you need to seek medical  attention at a hospital or doctor's office? Unknown When did it last happen?     over 10 years ago  If all above answers are "NO", may proceed with cephalosporin use.    Tetracyclines & Related Itching    Consultations: Neurosurgery/IR/ID/cardiology for TEE   Procedures/Studies: CT Lumbar Spine Wo Contrast  Result Date: 01/17/2021 CLINICAL DATA:  Spine fracture, lumbosacral, pathological concern for lumbar fracture. Spinal fracture, back pain, no new injury. EXAM: CT LUMBAR SPINE WITHOUT CONTRAST TECHNIQUE: Multidetector CT imaging of the lumbar spine was performed without intravenous contrast administration. Multiplanar CT image reconstructions were also generated. COMPARISON:  Lumbar spine MRI 01/17/2021. FINDINGS: Segmentation:  Five lumbar vertebrae. The caudal most well-formed intervertebral disc space is designated L5-S1. Alignment: Trace L1-L2 and L2-L3 grade 1 retrolisthesis. 6 mm L4-L5 grade 1 anterolisthesis. Vertebrae: No lumbar vertebral compression fracture. Multilevel degenerative endplate irregularity. Schmorl node within the L3 superior endplate and L2 inferior endplate. No definite acute fracture is identified. There is prominent irregularity of the left L4 and L5 articular pillars with possible sites of erosion. Associated widening of the left L4-L5 facet joint. To a lesser extent, there is irregularity of the right L4 and L5 articular pillars. Possible early osseous fusion across the L5-S1 disc space. Paraspinal and other soft tissues: Aortoiliac atherosclerosis. Bilateral nephrolithiasis. Disc levels: Multilevel degenerative changes, more fully characterized on the MRI performed earlier today. Please refer to this prior report for further description. IMPRESSION: No definite acute fracture of the lumbar spine. Prominent irregularity of the left L4 and L5 articular pillars with possible sites of erosion. Associated widening of the left L4-L5 facet joint. To a lesser extent, there is  irregularity of the right L4 and L5 articular pillars. These findings may reflect septic arthritis or advanced facet arthrosis. Clinical correlation is recommended. Additionally, MRI follow-up for surveillance should be considered. Acute facet joint capsular injury on the left at L4-L5 cannot be excluded, particularly if there has been recent trauma. 4 mm L4-L5 grade 1 anterolisthesis. Trace L1-L2, L2-L3 and L3-L4 grade 1 retrolisthesis. Lumbar spondylosis, more fully characterized on the lumbar spine MRI performed earlier today. Please refer to this prior report for further description. Aortic Atherosclerosis (ICD10-I70.0). Bilateral nephrolithiasis. Electronically Signed   By: Kellie Simmering D.O.   On: 01/17/2021 18:17   MR LUMBAR SPINE WO CONTRAST  Result Date: 01/17/2021 CLINICAL DATA:  Severe low back pain, concern for cord compression EXAM: MRI LUMBAR SPINE WITHOUT CONTRAST TECHNIQUE: Multiplanar, multisequence MR imaging of the lumbar spine was performed. No intravenous contrast was administered. COMPARISON:  No prior MRI correlation is made with 08/12/2019 CT pelvis FINDINGS: Segmentation:  Standard. Alignment: 6 mm retrolisthesis L1 on L2, which appears similar to 08/12/2019. Grade 1 anterolisthesis L4 on L5, which appears unchanged compared to 08/12/2019. Vertebrae: Increased T2 signal in the left facets at L4-L5, extending from the superior posterior left aspect of the L5 vertebral body, through the left pedicle and into the facet, with edema in the surrounding soft tissue fat. Degenerative endplate changes at K8-J6. Schmorl's nodes at L2-L3. 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 Conus medullaris and cauda equina: Conus extends to the L1-L2 level. Conus and cauda equina appear normal. Paraspinal and other soft tissues: Increased T2 signal in the soft tissues surrounding the left L4-L5 facets. Otherwise negative. Disc levels: T12-L1: No significant disc bulge. No spinal canal stenosis or neural foraminal narrowing.  Right perineural cyst. L1-L2: 6 mm retrolisthesis. Disc height loss with disc osteophyte complex. Mild facet arthropathy. No spinal canal stenosis. Moderate bilateral neural foraminal narrowing. L2-L3: Broad-based disc bulge. Epidural lipomatosis. Moderate facet arthropathy with ligamentum flavum hypertrophy. Mild spinal canal stenosis. Moderate left and mild-to-moderate right neural foraminal narrowing. L3-L4: Mild disc bulge, with epidural lipomatosis. Mild facet arthropathy with ligamentum flavum hypertrophy. Minimal thecal sac narrowing. Mild left neural foraminal narrowing. L4-L5: Grade 1 anterolisthesis with disc unroofing and broad-based disc bulge. Increased T2 signal in the left facet, with bony fragments and edema that cause rightward deviation of the thecal sac, with mild spinal canal stenosis. Moderate to severe right facet arthropathy, with fluid in right facet. Moderate left and mild right foraminal narrowing. L5-S1: Disc height  loss with minimal disc bulge. No spinal canal stenosis. No neural foraminal narrowing. IMPRESSION: 1. Increased T2 signal about and disruption of the left L4-L5 facets, with increased T2 signal in posterior L5 vertebral body, left pedicle, and surrounding soft tissues. Edema and osseous destruction cause mild spinal canal narrowing. This could represent acute fracture or infection. Correlate with history of trauma and consider CT for further osseous evaluation. 2. L2-L3 mild canal, moderate left neural foraminal narrowing and mild-to-moderate right neural foraminal narrowing. These results were called by telephone at the time of interpretation on 01/17/2021 at 12:52 pm to provider MADISON [facility] , who verbally acknowledged these results. Electronically Signed   By: Merilyn Baba M.D.   On: 01/17/2021 12:53   CT ABDOMEN PELVIS W CONTRAST  Result Date: 01/23/2021 CLINICAL DATA:  History of chronic low back pain and lumbar discitis osteomyelitis, MRSA bacteremia, pelvic and  thigh pain, elevated white blood cell count EXAM: CT ABDOMEN AND PELVIS WITH CONTRAST TECHNIQUE: Multidetector CT imaging of the abdomen and pelvis was performed using the standard protocol following bolus administration of intravenous contrast. CONTRAST:  50m OMNIPAQUE IOHEXOL 350 MG/ML SOLN COMPARISON:  01/17/2021, 08/12/2019 FINDINGS: Lower chest: Dependent linear consolidation within the lower lobes most consistent with atelectasis. Trace right pleural effusion. Hepatobiliary: Minimal high attenuation material within the gallbladder likely reflects sludge. Stable focal adenomyomatosis of the gallbladder fundus. No calcified gallstones or cholecystitis. The liver is unremarkable. Pancreas: Unremarkable. No pancreatic ductal dilatation or surrounding inflammatory changes. Spleen: Normal in size without focal abnormality. Adrenals/Urinary Tract: There are bilateral nonobstructing renal calculi, decreased in number since prior study. Three distinct calculi are visible within the lower pole right kidney, largest measuring 9 mm. There are approximately 4 discrete left renal calculi, largest in the upper pole measuring 5 mm. No evidence of obstructive uropathy within either kidney. Indeterminate hypodensities are seen within the right kidney, measuring 11 mm on image 52/2 and 15 mm on image 57/2. These correspond to hyperdense areas on previous unenhanced exam. Dedicated renal MRI or CT is recommended if not previously performed. The adrenals and bladder are unremarkable. Stomach/Bowel: No bowel obstruction or ileus. Normal appendix right lower quadrant. No bowel wall thickening or inflammatory change. Vascular/Lymphatic: Continued atherosclerosis throughout the aorta and its branches. No pathologically enlarged lymph nodes. Reproductive: Uterus and bilateral adnexa are unremarkable. Other: No free intraperitoneal fluid or free gas. No abdominal wall hernia. Musculoskeletal: The destructive changes at the left L4/L5  facet are again noted, consistent with given history of osteomyelitis. Please correlate with results of recent facet joint aspiration. There is severe central canal stenosis as result of the inflammatory changes surrounding the left facet joint as well as circumferential disc bulge, which appears slightly more pronounced since prior study. Additionally, abnormal soft tissue density is seen within the paraspinal soft tissues and retroperitoneum extending superiorly from the L4-5 level, most pronounced at L1, L2, and L3, highly concerning for ascending infection. I do not see any discrete paraspinal fluid collection or abscess at this time. Additionally, there is subtle enhancing soft tissue within the ventral aspect of the central canal from L4 through L1, concerning for possible epidural abscess. Correlation with follow-up MRI is recommended. Reconstructed images demonstrate no additional findings. IMPRESSION: 1. Progressive osteomyelitis at the left L4-5 facet joint, with evidence of ascending infection and probable epidural abscess and paraspinal phlegmon from L1 through L4. Follow-up MRI lumbar spine with and without contrast is recommended for more detailed evaluation. 2. Bilateral nonobstructing renal calculi. No evidence  of obstructive uropathy within either kidney. 3. Indeterminate hypodensities right kidney, possibly hyperdense cysts, slightly increased since prior study. Follow-up renal MRI or CT could be performed if not previously evaluated. 4. Minimal gallbladder sludge.  No cholelithiasis or cholecystitis. 5.  Aortic Atherosclerosis (ICD10-I70.0). Critical Value/emergent results were called by telephone at the time of interpretation on 01/23/2021 at 4:28 pm to provider [facility] , who verbally acknowledged these results. Electronically Signed   By: Randa Ngo M.D.   On: 01/23/2021 16:38   ECHOCARDIOGRAM COMPLETE  Result Date: 01/19/2021    ECHOCARDIOGRAM REPORT   Patient Name:   JERMIYAH RICOTTA  Date of Exam: 01/19/2021 Medical Rec #:  [id number]       Height:       66.0 in Accession #:    [id number]      Weight:       200.0 lb Date of Birth:  1950-07-25        BSA:          2.000 m Patient Age:    86 years        BP:           136/64 mmHg Patient Gender: F               HR:           87 bpm. Exam Location:  Inpatient Procedure: 2D Echo, Color Doppler and Cardiac Doppler Indications:    Bacteremia  History:        Patient has prior history of Echocardiogram examinations, most                 recent 08/06/2016. Risk Factors:Dyslipidemia and Hypertension.  Sonographer:    Bernadene Person RDCS Referring Phys: [id number] [place]  1. Left ventricular ejection fraction, by estimation, is 60 to 65%. The left ventricle has normal function. The left ventricle has no regional wall motion abnormalities. There is moderate asymmetric left ventricular hypertrophy of the basal-septal segment. Left ventricular diastolic parameters were normal.  2. Right ventricular systolic function is normal. The right ventricular size is normal. There is normal pulmonary artery systolic pressure. The estimated right ventricular systolic pressure is 54.6 mmHg.  3. The mitral valve is normal in structure. Trivial mitral valve regurgitation. No evidence of mitral stenosis.  4. The aortic valve was not well visualized. Aortic valve regurgitation is not visualized. No aortic stenosis is present.  5. The inferior vena cava is normal in size with <50% respiratory variability, suggesting right atrial pressure of 8 mmHg. Conclusion(s)/Recommendation(s): No vegetation seen. If high clinical suspicion for endocarditis, recommend TEE. FINDINGS  Left Ventricle: Left ventricular ejection fraction, by estimation, is 60 to 65%. The left ventricle has normal function. The left ventricle has no regional wall motion abnormalities. The left ventricular internal cavity size was normal in size. There is  moderate asymmetric left ventricular hypertrophy  of the basal-septal segment. Left ventricular diastolic parameters were normal. Right Ventricle: The right ventricular size is normal. No increase in right ventricular wall thickness. Right ventricular systolic function is normal. There is normal pulmonary artery systolic pressure. The tricuspid regurgitant velocity is 2.23 m/s, and  with an assumed right atrial pressure of 8 mmHg, the estimated right ventricular systolic pressure is 56.8 mmHg. Left Atrium: Left atrial size was normal in size. Right Atrium: Right atrial size was normal in size. Pericardium: There is no evidence of pericardial effusion. Mitral Valve: The mitral valve is normal in structure. Trivial mitral  valve regurgitation. No evidence of mitral valve stenosis. Tricuspid Valve: The tricuspid valve is normal in structure. Tricuspid valve regurgitation is trivial. Aortic Valve: The aortic valve was not well visualized. Aortic valve regurgitation is not visualized. No aortic stenosis is present. Pulmonic Valve: The pulmonic valve was not well visualized. Pulmonic valve regurgitation is trivial. Aorta: The aortic root and ascending aorta are structurally normal, with no evidence of dilitation. Venous: The inferior vena cava is normal in size with less than 50% respiratory variability, suggesting right atrial pressure of 8 mmHg. IAS/Shunts: The interatrial septum was not well visualized.  LEFT VENTRICLE PLAX 2D LVIDd:         4.10 cm  Diastology LVIDs:         2.70 cm  LV e' medial:    7.75 cm/s LV PW:         1.20 cm  LV E/e' medial:  10.7 LV IVS:        1.20 cm  LV e' lateral:   9.38 cm/s LVOT diam:     2.00 cm  LV E/e' lateral: 8.9 LV SV:         59 LV SV Index:   30 LVOT Area:     3.14 cm  RIGHT VENTRICLE RV S prime:     15.40 cm/s TAPSE (M-mode): 2.6 cm LEFT ATRIUM             Index       RIGHT ATRIUM           Index LA diam:        3.30 cm 1.65 cm/m  RA Area:     13.90 cm LA Vol (A2C):   24.2 ml 12.10 ml/m RA Volume:   33.20 ml  16.60 ml/m LA  Vol (A4C):   22.5 ml 11.25 ml/m LA Biplane Vol: 23.9 ml 11.95 ml/m  AORTIC VALVE             PULMONIC VALVE LVOT Vmax:   103.00 cm/s PR End Diast Vel: 5.95 msec LVOT Vmean:  66.400 cm/s LVOT VTI:    0.189 m  AORTA Ao Root diam: 3.80 cm Ao Asc diam:  3.50 cm MITRAL VALVE               TRICUSPID VALVE MV Area (PHT): 2.73 cm    TR Peak grad:   19.9 mmHg MV Decel Time: 278 msec    TR Vmax:        223.00 cm/s MV E velocity: 83.10 cm/s MV A velocity: 88.30 cm/s  SHUNTS MV E/A ratio:  0.94        Systemic VTI:  0.19 m                            Systemic Diam: 2.00 cm Oswaldo Milian MD Electronically signed by Oswaldo Milian MD Signature Date/Time: 01/19/2021/5:28:16 PM    Final    ECHO TEE  Result Date: 01/25/2021    TRANSESOPHOGEAL ECHO REPORT   Patient Name:   KAJUANA SHAREEF Date of Exam: 01/25/2021 Medical Rec #:  [id number]       Height:       66.0 in Accession #:    [id number]      Weight:       200.0 lb Date of Birth:  Jul 19, 1950        BSA:          2.000 m Patient Age:  70 years        BP:           126/81 mmHg Patient Gender: F               HR:           83 bpm. Exam Location:  Inpatient Procedure: 3D Echo, 2D Echo and Color Doppler Indications:     Bacteremia  History:         Patient has prior history of Echocardiogram examinations, most                  recent 01/19/2021. Risk Factors:Hypertension and Dyslipidemia.  Sonographer:     Darlina Sicilian RDCS Referring Phys:  [id number] Tami Lin [facility] Diagnosing Phys: Buford Dresser MD PROCEDURE: After discussion of the risks and benefits of a TEE, an informed consent was obtained from the patient. TEE procedure time was 17 minutes. The transesophogeal probe was passed without difficulty through the esophogus of the patient. Imaged were obtained with the patient in a left lateral decubitus position. Local oropharyngeal anesthetic was provided with Cetacaine. Sedation performed by different physician. The patient was monitored while under deep  sedation. Anesthestetic sedation was provided intravenously by Anesthesiology: 440m of Propofol. Image quality was excellent. The patient's vital signs; including heart rate, blood pressure, and oxygen saturation; remained stable throughout the procedure. The patient developed no complications during the procedure. IMPRESSIONS  1. Left ventricular ejection fraction, by estimation, is 60 to 65%. The left ventricle has normal function.  2. Right ventricular systolic function is normal. The right ventricular size is normal.  3. No left atrial/left atrial appendage thrombus was detected.  4. The mitral valve is abnormal. Trivial mitral valve regurgitation. No evidence of mitral stenosis. 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  5. The aortic valve is tricuspid. Aortic valve regurgitation is trivial. No aortic stenosis is present.  6. 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  7. Evidence of atrial level shunting detected by color flow Doppler. Agitated saline contrast bubble study was negative, with no evidence of any interatrial shunt. There is a small patent foramen ovale with predominantly left to right shunting across the atrial septum. Conclusion(s)/Recommendation(s): No evidence of vegetation/infective endocarditis on this transesophageal echocardiogram. Small PFO with left to right flow but no right to left flow. FINDINGS  Left Ventricle: Left ventricular ejection fraction, by estimation, is 60 to 65%. The left ventricle has normal function. The left ventricular internal cavity size was normal in size. Right Ventricle: The right ventricular size is normal. No increase in right ventricular wall thickness. Right ventricular systolic function is normal. Left Atrium: Left atrial size was normal in size. No left atrial/left atrial appendage thrombus was detected. Right Atrium: Right atrial size was normal in size. Pericardium:  Trivial pericardial effusion is present. Mitral Valve: The mitral valve is abnormal. 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 Trivial mitral valve regurgitation. No evidence of mitral valve stenosis. There is no evidence of mitral valve vegetation. Tricuspid Valve: The tricuspid valve is normal in structure. Tricuspid valve regurgitation is trivial. No evidence of tricuspid stenosis. There is no evidence of tricuspid valve vegetation. Aortic Valve: The aortic valve is tricuspid. Aortic valve regurgitation is trivial. No aortic stenosis is present. There is no evidence of aortic valve vegetation. Pulmonic Valve: The pulmonic valve was normal in structure.  Pulmonic valve regurgitation is trivial. No evidence of pulmonic stenosis. There is no evidence of pulmonic valve vegetation. Aorta: The aortic root is normal in size and structure. There is moderate (Grade III) plaque involving the descending aorta. IAS/Shunts: Evidence of atrial level shunting detected by color flow Doppler. Agitated saline contrast was given intravenously to evaluate for intracardiac shunting. Agitated saline contrast bubble study was negative, with no evidence of any interatrial shunt. A small patent foramen ovale is detected with predominantly left to right shunting across the atrial septum.  LEFT VENTRICLE PLAX 2D LVOT diam:     1.80 cm LVOT Area:     2.54 cm   AORTA Ao Root diam: 3.40 cm Ao Asc diam:  3.10 cm  SHUNTS Systemic Diam: 1.80 cm Buford Dresser MD Electronically signed by Buford Dresser MD Signature Date/Time: 01/25/2021/3:41:45 PM    Final    CT IMAGE GUIDED DRAINAGE BY PERCUTANEOUS CATHETER  Result Date: 01/18/2021 INDICATION: L4-5 facet arthropathy EXAM: CT GUIDED LEFT LUMBAR L4/5 FACET JOINT ASPIRATION MEDICATIONS: None. ANESTHESIA/SEDATION: Local anesthetic was administered. COMPLICATIONS: None immediate. Estimated blood loss: <5 mL PROCEDURE:  Informed written consent was obtained from the patient after a thorough discussion of the procedural risks, benefits and alternatives. All questions were addressed. Maximal Sterile Barrier Technique was utilized including caps, mask, sterile gowns, sterile gloves, sterile drape, hand hygiene and skin antiseptic. A timeout was performed prior to the initiation of the procedure. The patient was positioned prone and non-contrast localization CT was performed of the pelvis to demonstrate the L4-5 facet joint space. Maximal barrier sterile technique utilized including caps, mask, sterile gowns, sterile gloves, large sterile drape, hand hygiene, and chlorhexidine prep. Under sterile conditions and local anesthesia, an 9 cm, 18 gauge Yueh needle was advanced into the left L4-5 facet joint space. Needle position was confirmed with CT imaging. Aspiration was performed yielding approximately 12 mL of sanguinous aspirate. Needle was removed. Hemostasis was obtained with compression. The patient tolerated the procedure well. Samples were submitted for culture. IMPRESSION: Successful CT-guided aspiration of left lumbar L4/5 facet joint, as above. Michaelle Birks, MD [facility] Radiology Electronically Signed   By: Michaelle Birks M.D.   On: 01/18/2021 16:41   [place] EKG SITE RITE  Result Date: 01/26/2021 If [facility] image not attached, placement could not be confirmed due to current cardiac rhythm.     Subjective: Patient seen and examined at bedside.  Denies worsening fever, vomiting, shortness of breath.  Still complains of intermittent back pain.  Feels okay to be discharged to SNF today.  Discharge Exam: Vitals:   01/27/21 0615 01/27/21 0722  BP: 126/73   Pulse: 74   Resp: 18   Temp: 97.8 F (36.6 C)   SpO2: 98% 96%    General: Pt is alert, awake, not in acute distress.  On room air currently.  Looks deconditioned.  Right upper extremity PICC line  present. Cardiovascular: rate controlled, S1/S2 + Respiratory: bilateral decreased breath sounds at bases Abdominal: Soft, NT, ND, bowel sounds + Extremities: Trace lower extremity edema; no cyanosis    The results of significant diagnostics from this hospitalization (including imaging, microbiology, ancillary and laboratory) are listed below for reference.     Microbiology: Recent Results (from the past 240 hour(s))  SARS CORONAVIRUS 2 (TAT 6-24 HRS) Nasopharyngeal Nasopharyngeal Swab     Status: None   Collection Time: 01/17/21  5:42 PM   Specimen: Nasopharyngeal Swab  Result Value Ref Range Status  SARS Coronavirus 2 NEGATIVE NEGATIVE Final    Comment: (NOTE) SARS-CoV-2 target nucleic acids are NOT DETECTED.  The SARS-CoV-2 RNA is generally detectable in upper and lower respiratory specimens during the acute phase of infection. Negative results do not preclude SARS-CoV-2 infection, do not rule out co-infections with other pathogens, and should not be used as the sole basis for treatment or other patient management decisions. Negative results must be combined with clinical observations, patient history, and epidemiological information. The expected result is Negative.  Fact Sheet for Patients: SugarRoll.be  Fact Sheet for Healthcare Providers: https://www.woods-mathews.com/  This test is not yet approved or cleared by the [place] FDA and  has been authorized for detection and/or diagnosis of SARS-CoV-2 by FDA under an Emergency Use Authorization (EUA). This EUA will remain  in effect (meaning this test can be used) for the duration of the COVID-19 declaration under Se ction 564(b)(1) of the Act, 21 U.S.C. section 360bbb-3(b)(1), unless the authorization is terminated or revoked sooner.  Performed at [facility], [place] [address] [place], [place] [address]   Aerobic/Anaerobic Culture w Gram Stain (surgical/deep  wound)     Status: None   Collection Time: 01/18/21  2:52 PM   Specimen: Abscess  Result Value Ref Range Status   Specimen Description   Final    ABSCESS VERTEBRA Performed at [place] [address] [place], [place] [address]    Special Requests   Final    NONE Performed at [facility], [place] [address] [place], [place] [address]    Gram Stain   Final    RARE WBC PRESENT, PREDOMINANTLY MONONUCLEAR NO ORGANISMS SEEN    Culture   Final    No growth aerobically or anaerobically. Performed at [facility], [place] [address], [place], [place] [address]    Report Status 01/23/2021 FINAL  Final  Culture, blood (Routine X 2) w Reflex to ID Panel     Status: Abnormal   Collection Time: 01/18/21  5:08 PM   Specimen: BLOOD  Result Value Ref Range Status   Specimen Description   Final    BLOOD LEFT ANTECUBITAL Performed at [facility], [place] [address], [place], [place] [address]    Special Requests   Final    BOTTLES DRAWN AEROBIC AND ANAEROBIC Blood Culture adequate volume Performed at [place] [address], [place], [place] [address]    Culture  Setup Time   Final    GRAM POSITIVE COCCI IN CLUSTERS IN BOTH AEROBIC AND ANAEROBIC BOTTLES CRITICAL VALUE NOTED.  VALUE IS CONSISTENT WITH PREVIOUSLY REPORTED AND CALLED VALUE.    Culture (A)  Final    STAPHYLOCOCCUS AUREUS SUSCEPTIBILITIES PERFORMED ON PREVIOUS CULTURE WITHIN THE LAST 5 DAYS. Performed at [facility], [place] [address], [place], [place] [address]    Report Status 01/21/2021 FINAL  Final  Culture, blood (Routine X 2) w Reflex to ID Panel     Status: Abnormal   Collection Time: 01/18/21  5:08 PM   Specimen: BLOOD RIGHT HAND  Result Value Ref Range Status   Specimen Description   Final    BLOOD RIGHT HAND Performed at [place] [address] [place], [place] [address]    Special Requests   Final    BOTTLES  DRAWN AEROBIC AND ANAEROBIC Blood Culture adequate volume Performed at [place] [address], [place], [place] [address]  Culture  Setup Time   Final    GRAM POSITIVE COCCI IN CLUSTERS IN BOTH AEROBIC AND ANAEROBIC BOTTLES CRITICAL RESULT CALLED TO, READ BACK BY AND VERIFIED WITH: PHARMD MARY [place] 01/19/2021 _0  BY JW Performed at [facility], [place] [address], [place], [place] [address]    Culture STAPHYLOCOCCUS AUREUS (A)  Final   Report Status 01/21/2021 FINAL  Final   Organism ID, Bacteria STAPHYLOCOCCUS AUREUS  Final      Susceptibility   Staphylococcus aureus - MIC*    CIPROFLOXACIN >=8 RESISTANT Resistant     ERYTHROMYCIN <=0.25 SENSITIVE Sensitive     GENTAMICIN <=0.5 SENSITIVE Sensitive     OXACILLIN <=0.25 SENSITIVE Sensitive     TETRACYCLINE <=1 SENSITIVE Sensitive     VANCOMYCIN <=0.5 SENSITIVE Sensitive     TRIMETH/SULFA <=10 SENSITIVE Sensitive     CLINDAMYCIN <=0.25 SENSITIVE Sensitive     RIFAMPIN <=0.5 SENSITIVE Sensitive     Inducible Clindamycin NEGATIVE Sensitive     * STAPHYLOCOCCUS AUREUS  Blood Culture ID Panel (Reflexed)     Status: Abnormal   Collection Time: 01/18/21  5:08 PM  Result Value Ref Range Status   Enterococcus faecalis NOT DETECTED NOT DETECTED Final   Enterococcus Faecium NOT DETECTED NOT DETECTED Final   Listeria monocytogenes NOT DETECTED NOT DETECTED Final   Staphylococcus species DETECTED (A) NOT DETECTED Final    Comment: CRITICAL RESULT CALLED TO, READ BACK BY AND VERIFIED WITH: PHARMD MARY [place] 01/19/2021 _1  BY JW    Staphylococcus aureus (BCID) DETECTED (A) NOT DETECTED Final    Comment: CRITICAL RESULT CALLED TO, READ BACK BY AND VERIFIED WITH: PHARMD MARY [place] 01/19/2021 _2  BY JW    Staphylococcus epidermidis NOT DETECTED NOT DETECTED Final   Staphylococcus lugdunensis NOT DETECTED NOT DETECTED Final   Streptococcus species NOT DETECTED NOT DETECTED Final   Streptococcus agalactiae  NOT DETECTED NOT DETECTED Final   Streptococcus pneumoniae NOT DETECTED NOT DETECTED Final   Streptococcus pyogenes NOT DETECTED NOT DETECTED Final   A.calcoaceticus-baumannii NOT DETECTED NOT DETECTED Final   Bacteroides fragilis NOT DETECTED NOT DETECTED Final   Enterobacterales NOT DETECTED NOT DETECTED Final   Enterobacter cloacae complex NOT DETECTED NOT DETECTED Final   Escherichia coli NOT DETECTED NOT DETECTED Final   Klebsiella aerogenes NOT DETECTED NOT DETECTED Final   Klebsiella oxytoca NOT DETECTED NOT DETECTED Final   Klebsiella pneumoniae NOT DETECTED NOT DETECTED Final   Proteus species NOT DETECTED NOT DETECTED Final   Salmonella species NOT DETECTED NOT DETECTED Final   Serratia marcescens NOT DETECTED NOT DETECTED Final   Haemophilus influenzae NOT DETECTED NOT DETECTED Final   Neisseria meningitidis NOT DETECTED NOT DETECTED Final   Pseudomonas aeruginosa NOT DETECTED NOT DETECTED Final   Stenotrophomonas maltophilia NOT DETECTED NOT DETECTED Final   Candida albicans NOT DETECTED NOT DETECTED Final   Candida auris NOT DETECTED NOT DETECTED Final   Candida glabrata NOT DETECTED NOT DETECTED Final   Candida krusei NOT DETECTED NOT DETECTED Final   Candida parapsilosis NOT DETECTED NOT DETECTED Final   Candida tropicalis NOT DETECTED NOT DETECTED Final   Cryptococcus neoformans/gattii NOT DETECTED NOT DETECTED Final   Meth resistant mecA/C and MREJ NOT DETECTED NOT DETECTED Final    Comment: Performed at [facility], [address], [place], [place] [address]  Culture, blood (routine x 2)     Status: Abnormal   Collection Time: 01/20/21  4:26 AM   Specimen: BLOOD  Result Value Ref  Range Status   Specimen Description   Final    BLOOD LEFT ANTECUBITAL Performed at [place] [address] [place], [place] [address]    Special Requests   Final    BOTTLES DRAWN AEROBIC AND ANAEROBIC Blood Culture adequate volume Performed at [place] [address] [place], [place] [address]    Culture  Setup Time   Final    GRAM POSITIVE COCCI IN CLUSTERS ANAEROBIC BOTTLE ONLY CRITICAL VALUE NOTED.  VALUE IS CONSISTENT WITH PREVIOUSLY REPORTED AND CALLED VALUE.    Culture (A)  Final    STAPHYLOCOCCUS AUREUS SUSCEPTIBILITIES PERFORMED ON PREVIOUS CULTURE WITHIN THE LAST 5 DAYS. Performed at [facility], [place] [address] [place], [place] [address]    Report Status 01/23/2021 FINAL  Final  Culture, blood (routine x 2)     Status: Abnormal   Collection Time: 01/20/21  4:32 AM   Specimen: BLOOD RIGHT HAND  Result Value Ref Range Status   Specimen Description   Final    BLOOD RIGHT HAND Performed at [facility] [place] [address] [place], [place] [address]    Special Requests   Final    BOTTLES DRAWN AEROBIC ONLY Blood Culture adequate volume Performed at [facility] [address] [place], [place] [address]    Culture  Setup Time   Final    GRAM POSITIVE COCCI IN CLUSTERS AEROBIC BOTTLE ONLY CRITICAL VALUE NOTED.  VALUE IS CONSISTENT WITH PREVIOUSLY REPORTED AND CALLED VALUE.    Culture (A)  Final    STAPHYLOCOCCUS AUREUS SUSCEPTIBILITIES PERFORMED ON PREVIOUS CULTURE WITHIN THE LAST 5 DAYS. Performed at [facility], [place] [address] [place], [place] [address]    Report Status 01/24/2021 FINAL  Final  Culture, blood (Routine X 2) w Reflex to ID Panel     Status: None (Preliminary result)   Collection Time: 01/23/21 11:04 AM   Specimen: BLOOD  Result Value Ref Range Status   Specimen Description   Final    BLOOD BLOOD LEFT HAND Performed at [facility] [address], [place], [place] [address]    Special Requests   Final    BOTTLES DRAWN AEROBIC ONLY Blood Culture results may not be optimal due to an inadequate volume of blood received in culture bottles Performed at [facility] [address],  [place], [place] [address]    Culture   Final    NO GROWTH 4 DAYS Performed at [facility], [place] [address] [place], [place] [address]    Report Status PENDING  Incomplete  Culture, blood (Routine X 2) w Reflex to ID Panel     Status: None (Preliminary result)   Collection Time: 01/23/21 11:05 AM   Specimen: BLOOD  Result Value Ref Range Status   Specimen Description   Final    BLOOD LEFT ANTECUBITAL Performed at [place] [address], [place], [place] [address]    Special Requests   Final    BOTTLES DRAWN AEROBIC AND ANAEROBIC Blood Culture results may not be optimal due to an excessive volume of blood received in culture bottles Performed at [place] [address] [place], [place] [address]    Culture   Final    NO GROWTH 4 DAYS Performed at [facility], [place] [address] [place],  [address]    Report Status PENDING  Incomplete  Labs: BNP (last 3 results) No results for input(s): BNP in the last 8760 hours. Basic Metabolic Panel: Recent Labs  Lab 01/21/21 0445 01/24/21 0410 01/25/21 0447 01/25/21 0739  NA 135 137 137 137  K 3.4* 3.0* 4.0 3.8  CL 99 98 100 100  CO2 _0 GLUCOSE 123* 106* 85 92  BUN _1 CREATININE 0.61 0.57 0.66 0.64  CALCIUM 8.2* 8.3* 8.4* 8.3*  MG  --  2.0 2.1  --    Liver Function Tests: Recent Labs  Lab 01/21/21 0445 01/24/21 0410 01/25/21 0447  AST 15 22 36  ALT _2 ALKPHOS 98 92 91  BILITOT 0.7 0.3 0.6  PROT 7.5 7.5 7.3  ALBUMIN 2.4* 2.3* 2.2*   No results for input(s): LIPASE, AMYLASE in the last 168 hours. No results for input(s): AMMONIA in the last 168 hours. CBC: Recent Labs  Lab 01/21/21 0445 01/24/21 0410 01/25/21 0447  WBC 11.5* 9.3 9.7  NEUTROABS 9.9* 6.7 6.8  HGB 9.9* 9.6* 9.4*  HCT 31.1* 31.4* 30.2*  MCV 89.9 90.8 91.8  PLT 287 299 290   Cardiac Enzymes: No results for input(s): CKTOTAL, CKMB, CKMBINDEX, TROPONINI in  the last 168 hours. BNP: Invalid input(s): POCBNP CBG: No results for input(s): GLUCAP in the last 168 hours. D-Dimer No results for input(s): DDIMER in the last 72 hours. Hgb A1c No results for input(s): HGBA1C in the last 72 hours. Lipid Profile No results for input(s): CHOL, HDL, LDLCALC, TRIG, CHOLHDL, LDLDIRECT in the last 72 hours. Thyroid function studies No results for input(s): TSH, T4TOTAL, T3FREE, THYROIDAB in the last 72 hours.  Invalid input(s): FREET3 Anemia work up No results for input(s): VITAMINB12, FOLATE, FERRITIN, TIBC, IRON, RETICCTPCT in the last 72 hours. Urinalysis    Component Value Date/Time   COLORURINE YELLOW 08/12/2019 0203   APPEARANCEUR CLEAR 08/12/2019 0203   LABSPEC 1.012 08/12/2019 0203   PHURINE 6.0 08/12/2019 0203   GLUCOSEU NEGATIVE 08/12/2019 0203   HGBUR LARGE (A) 08/12/2019 0203   BILIRUBINUR NEGATIVE 08/12/2019 0203   KETONESUR NEGATIVE 08/12/2019 0203   PROTEINUR NEGATIVE 08/12/2019 0203   NITRITE NEGATIVE 08/12/2019 0203   LEUKOCYTESUR NEGATIVE 08/12/2019 0203   Sepsis Labs Invalid input(s): PROCALCITONIN,  WBC,  LACTICIDVEN Microbiology Recent Results (from the past 240 hour(s))  SARS CORONAVIRUS 2 (TAT 6-24 HRS) Nasopharyngeal Nasopharyngeal Swab     Status: None   Collection Time: 01/17/21  5:42 PM   Specimen: Nasopharyngeal Swab  Result Value Ref Range Status   SARS Coronavirus 2 NEGATIVE NEGATIVE Final    Comment: (NOTE) SARS-CoV-2 target nucleic acids are NOT DETECTED.  The SARS-CoV-2 RNA is generally detectable in upper and lower respiratory specimens during the acute phase of infection. Negative results do not preclude SARS-CoV-2 infection, do not rule out co-infections with other pathogens, and should not be used as the sole basis for treatment or other patient management decisions. Negative results must be combined with clinical observations, patient history, and epidemiological information. The expected result is  Negative.  Fact Sheet for Patients: SugarRoll.be  Fact Sheet for Healthcare Providers: https://www.woods-mathews.com/  This test is not yet approved or cleared by the [place] FDA and  has been authorized for detection and/or diagnosis of SARS-CoV-2 by FDA under an Emergency Use Authorization (EUA). This EUA will remain  in effect (meaning this test can be used) for the duration of the COVID-19 declaration under Se ction 564(b)(1) of the Act, 21 U.S.C.  section 360bbb-3(b)(1), unless the authorization is terminated or revoked sooner.  Performed at [facility], [place] [address] [place], [place] [address]   Aerobic/Anaerobic Culture w Gram Stain (surgical/deep wound)     Status: None   Collection Time: 01/18/21  2:52 PM   Specimen: Abscess  Result Value Ref Range Status   Specimen Description   Final    ABSCESS VERTEBRA Performed at [facility] [address] [place], [place] [address]    Special Requests   Final    NONE Performed at [facility], [place] [address], [place], [place] [address]    Gram Stain   Final    RARE WBC PRESENT, PREDOMINANTLY MONONUCLEAR NO ORGANISMS SEEN    Culture   Final    No growth aerobically or anaerobically. Performed at [facility], [place] [address], [place], [place] [address]    Report Status 01/23/2021 FINAL  Final  Culture, blood (Routine X 2) w Reflex to ID Panel     Status: Abnormal   Collection Time: 01/18/21  5:08 PM   Specimen: BLOOD  Result Value Ref Range Status   Specimen Description   Final    BLOOD LEFT ANTECUBITAL Performed at [facility], [place] [address] [place], [place] [address]    Special Requests   Final    BOTTLES DRAWN AEROBIC AND ANAEROBIC Blood Culture adequate volume Performed at [place] [address], [place], [place] [address]    Culture  Setup Time   Final    GRAM POSITIVE COCCI IN  CLUSTERS IN BOTH AEROBIC AND ANAEROBIC BOTTLES CRITICAL VALUE NOTED.  VALUE IS CONSISTENT WITH PREVIOUSLY REPORTED AND CALLED VALUE.    Culture (A)  Final    STAPHYLOCOCCUS AUREUS SUSCEPTIBILITIES PERFORMED ON PREVIOUS CULTURE WITHIN THE LAST 5 DAYS. Performed at [facility], [place] [address], [place], [place] [address]    Report Status 01/21/2021 FINAL  Final  Culture, blood (Routine X 2) w Reflex to ID Panel     Status: Abnormal   Collection Time: 01/18/21  5:08 PM   Specimen: BLOOD RIGHT HAND  Result Value Ref Range Status   Specimen Description   Final    BLOOD RIGHT HAND Performed at [facility] [address] [place], [place] [address]    Special Requests   Final    BOTTLES DRAWN AEROBIC AND ANAEROBIC Blood Culture adequate volume Performed at [facility] [address], [place], [place] [address]    Culture  Setup Time   Final    GRAM POSITIVE COCCI IN CLUSTERS IN BOTH AEROBIC AND ANAEROBIC BOTTLES CRITICAL RESULT CALLED TO, READ BACK BY AND VERIFIED WITH: PHARMD MARY [place] 01/19/2021 _0  BY JW Performed at [facility], [place] [address], [place], [place] [address]    Culture STAPHYLOCOCCUS AUREUS (A)  Final   Report Status 01/21/2021 FINAL  Final   Organism ID, Bacteria STAPHYLOCOCCUS AUREUS  Final      Susceptibility   Staphylococcus aureus - MIC*    CIPROFLOXACIN >=8 RESISTANT Resistant     ERYTHROMYCIN <=0.25 SENSITIVE Sensitive     GENTAMICIN <=0.5 SENSITIVE Sensitive     OXACILLIN <=0.25 SENSITIVE Sensitive     TETRACYCLINE <=1 SENSITIVE Sensitive     VANCOMYCIN <=0.5 SENSITIVE Sensitive     TRIMETH/SULFA <=10 SENSITIVE Sensitive     CLINDAMYCIN <=0.25 SENSITIVE Sensitive     RIFAMPIN <=0.5 SENSITIVE Sensitive  Inducible Clindamycin NEGATIVE Sensitive     * STAPHYLOCOCCUS AUREUS  Blood Culture ID Panel (Reflexed)     Status: Abnormal   Collection Time: 01/18/21  5:08 PM  Result Value Ref Range Status    Enterococcus faecalis NOT DETECTED NOT DETECTED Final   Enterococcus Faecium NOT DETECTED NOT DETECTED Final   Listeria monocytogenes NOT DETECTED NOT DETECTED Final   Staphylococcus species DETECTED (A) NOT DETECTED Final    Comment: CRITICAL RESULT CALLED TO, READ BACK BY AND VERIFIED WITH: PHARMD MARY SWAYNE 01/19/2021 _0  BY JW    Staphylococcus aureus (BCID) DETECTED (A) NOT DETECTED Final    Comment: CRITICAL RESULT CALLED TO, READ BACK BY AND VERIFIED WITH: PHARMD MARY SWAYNE 01/19/2021 _1  BY JW    Staphylococcus epidermidis NOT DETECTED NOT DETECTED Final   Staphylococcus lugdunensis NOT DETECTED NOT DETECTED Final   Streptococcus species NOT DETECTED NOT DETECTED Final   Streptococcus agalactiae NOT DETECTED NOT DETECTED Final   Streptococcus pneumoniae NOT DETECTED NOT DETECTED Final   Streptococcus pyogenes NOT DETECTED NOT DETECTED Final   A.calcoaceticus-baumannii NOT DETECTED NOT DETECTED Final   Bacteroides fragilis NOT DETECTED NOT DETECTED Final   Enterobacterales NOT DETECTED NOT DETECTED Final   Enterobacter cloacae complex NOT DETECTED NOT DETECTED Final   Escherichia coli NOT DETECTED NOT DETECTED Final   Klebsiella aerogenes NOT DETECTED NOT DETECTED Final   Klebsiella oxytoca NOT DETECTED NOT DETECTED Final   Klebsiella pneumoniae NOT DETECTED NOT DETECTED Final   Proteus species NOT DETECTED NOT DETECTED Final   Salmonella species NOT DETECTED NOT DETECTED Final   Serratia marcescens NOT DETECTED NOT DETECTED Final   Haemophilus influenzae NOT DETECTED NOT DETECTED Final   Neisseria meningitidis NOT DETECTED NOT DETECTED Final   Pseudomonas aeruginosa NOT DETECTED NOT DETECTED Final   Stenotrophomonas maltophilia NOT DETECTED NOT DETECTED Final   Candida albicans NOT DETECTED NOT DETECTED Final   Candida auris NOT DETECTED NOT DETECTED Final   Candida glabrata NOT DETECTED NOT DETECTED Final   Candida krusei NOT DETECTED NOT DETECTED Final   Candida  parapsilosis NOT DETECTED NOT DETECTED Final   Candida tropicalis NOT DETECTED NOT DETECTED Final   Cryptococcus neoformans/gattii NOT DETECTED NOT DETECTED Final   Meth resistant mecA/C and MREJ NOT DETECTED NOT DETECTED Final    Comment: Performed at [facility], [address] [place], [place] [address]  Culture, blood (routine x 2)     Status: Abnormal   Collection Time: 01/20/21  4:26 AM   Specimen: BLOOD  Result Value Ref Range Status   Specimen Description   Final    BLOOD LEFT ANTECUBITAL Performed at [place] [address], [place], [place] [address]    Special Requests   Final    BOTTLES DRAWN AEROBIC AND ANAEROBIC Blood Culture adequate volume Performed at [place] [address], [place], [place] [address]    Culture  Setup Time   Final    GRAM POSITIVE COCCI IN CLUSTERS ANAEROBIC BOTTLE ONLY CRITICAL VALUE NOTED.  VALUE IS CONSISTENT WITH PREVIOUSLY REPORTED AND CALLED VALUE.    Culture (A)  Final    STAPHYLOCOCCUS AUREUS SUSCEPTIBILITIES PERFORMED ON PREVIOUS CULTURE WITHIN THE LAST 5 DAYS. Performed at [facility], [place] [address], [place], [place] [address]    Report Status 01/23/2021 FINAL  Final  Culture, blood (routine x 2)     Status: Abnormal   Collection Time: 01/20/21  4:32 AM   Specimen: BLOOD  RIGHT HAND  Result Value Ref Range Status   Specimen Description   Final    BLOOD RIGHT HAND Performed at [facility] [address], [place], [place] [address]    Special Requests   Final    BOTTLES DRAWN AEROBIC ONLY Blood Culture adequate volume Performed at [facility] [address], [place], [place] [address]    Culture  Setup Time   Final    GRAM POSITIVE COCCI IN CLUSTERS AEROBIC BOTTLE ONLY CRITICAL VALUE NOTED.  VALUE IS CONSISTENT WITH PREVIOUSLY REPORTED AND CALLED VALUE.    Culture (A)  Final    STAPHYLOCOCCUS AUREUS SUSCEPTIBILITIES  PERFORMED ON PREVIOUS CULTURE WITHIN THE LAST 5 DAYS. Performed at [facility], [place] [address], [place], [place] [address]    Report Status 01/24/2021 FINAL  Final  Culture, blood (Routine X 2) w Reflex to ID Panel     Status: None (Preliminary result)   Collection Time: 01/23/21 11:04 AM   Specimen: BLOOD  Result Value Ref Range Status   Specimen Description   Final    BLOOD BLOOD LEFT HAND Performed at [facility] [address] [place], [place] [address]    Special Requests   Final    BOTTLES DRAWN AEROBIC ONLY Blood Culture results may not be optimal due to an inadequate volume of blood received in culture bottles Performed at [place] [address] [place], [place] [address]    Culture   Final    NO GROWTH 4 DAYS Performed at [facility], [place] [address], [place], [place] [address]    Report Status PENDING  Incomplete  Culture, blood (Routine X 2) w Reflex to ID Panel     Status: None (Preliminary result)   Collection Time: 01/23/21 11:05 AM   Specimen: BLOOD  Result Value Ref Range Status   Specimen Description   Final    BLOOD LEFT ANTECUBITAL Performed at [facility] [address], [place], [place] [address]    Special Requests   Final    BOTTLES DRAWN AEROBIC AND ANAEROBIC Blood Culture results may not be optimal due to an excessive volume of blood received in culture bottles Performed at [facility] [address] [place], [place] [address]    Culture   Final    NO GROWTH 4 DAYS Performed at [facility], [place] [address] [place], [place] [address]    Report Status PENDING  Incomplete     Time coordinating discharge: 35 minutes  SIGNED:   Aline August, MD  [facility] 01/27/2021, 9:55 AM

## 2021-01-27 NOTE — Progress Notes (Signed)
Called [facility] to give report x3. Unable to due to no one answering the phone.

## 2021-01-27 NOTE — TOC Transition Note (Signed)
Transition of Care [facility]) - CM/SW Discharge Note   Patient Details  Name: Haley Singh MRN: [id number] Date of Birth: [birth year]  Transition of [facility]) CM/SW Contact:  Amada Jupiter, LCSW Phone Number: 01/27/2021, 1:32 PM   Clinical Narrative:    Pt medically cleared for dc to SNF today and pt has accepted bed at [facility].  Pt able to receive 2nd COVID booster prior to dc.  Insurance auth received.  PTAR called at 1:30pm.  RN to call report to [phone].  No further TOC needs.   Final next level of care: Skilled Nursing Facility Barriers to Discharge: Continued Medical Work up   Patient Goals and CMS Choice Patient states their goals for this hospitalization and ongoing recovery are:: Go to SNF CMS Medicare.gov Compare Post Acute Care list provided to:: Patient Choice offered to / list presented to : Patient  Discharge Placement PASRR number recieved: 01/23/21            Patient [facility] bed at: [facility] Patient to be transferred to facility by: PTAR Name of family member notified: friend Patient and family notified of of transfer: 01/27/21  Discharge Plan and Services In-house Referral: Clinical Social Work Discharge Planning Services: CM Consult Post Acute Care Choice: Skilled Nursing Facility          DME Arranged: N/A DME Agency: NA                  Social Determinants of Health (SDOH) Interventions     Readmission Risk Interventions Readmission Risk Prevention Plan 01/22/2021  Medication Screening Complete  Transportation Screening Complete  Some recent data might be hidden

## 2021-01-28 LAB — CULTURE, BLOOD (ROUTINE X 2)
Culture: NO GROWTH
Culture: NO GROWTH

## 2021-01-31 ENCOUNTER — Other Ambulatory Visit: Payer: Self-pay | Admitting: Pulmonary Disease

## 2021-02-08 ENCOUNTER — Other Ambulatory Visit: Payer: Self-pay

## 2021-02-08 ENCOUNTER — Ambulatory Visit (INDEPENDENT_AMBULATORY_CARE_PROVIDER_SITE_OTHER): Payer: Medicare PPO | Admitting: Internal Medicine

## 2021-02-08 VITALS — BP 117/72 | HR 75 | Resp 16 | Ht 66.0 in | Wt 200.0 lb

## 2021-02-08 DIAGNOSIS — R7881 Bacteremia: Secondary | ICD-10-CM | POA: Diagnosis not present

## 2021-02-08 DIAGNOSIS — B9561 Methicillin susceptible Staphylococcus aureus infection as the cause of diseases classified elsewhere: Secondary | ICD-10-CM | POA: Diagnosis not present

## 2021-02-08 DIAGNOSIS — M4620 Osteomyelitis of vertebra, site unspecified: Secondary | ICD-10-CM | POA: Diagnosis not present

## 2021-02-08 NOTE — Progress Notes (Signed)
[place] for Infectious Disease  Patient Active Problem List   Diagnosis Date Noted   MSSA bacteremia    Discitis of lumbar region    Osteomyelitis ([place]) 01/17/2021   Mild persistent asthma without complication 94/76/5465   OSA (obstructive sleep apnea) 03/03/2014      Subjective:    Patient ID: Haley Singh, female    DOB: 1951/03/25, 70 y.o.   MRN: [id number]  Chief Complaint  Patient presents with   Hospitalization Follow-up    Is at facility and says they give IV Abx late and had to change PICC 2 times missing 8 doses estimated.     HPI:  Haley Singh is a 70 y.o. female here for hospital f/u mssa bacteremia/epidural spinal abscess   She is discharged on 8/26 to [facility] rehab A week ago her picc has to be replaced. She said she missed "8" doses of cefazolin.   Labs were not sent today 02/08/21 visit  No n/v/diarrhea   Allergies  Allergen Reactions   Oxycontin [Oxycodone] Itching   Penicillins Other (See Comments)    unk Did it involve swelling of the face/tongue/throat, SOB, or low BP? Unknown Did it involve sudden or severe rash/hives, skin peeling, or any reaction on the inside of your mouth or nose? Unknown Did you need to seek medical attention at a hospital or doctor's office? Unknown When did it last happen?     over 10 years ago  If all above answers are "NO", may proceed with cephalosporin use.    Tetracyclines & Related Itching      Outpatient Medications Prior to Visit  Medication Sig Dispense Refill   albuterol (PROVENTIL HFA;VENTOLIN HFA) 108 (90 BASE) MCG/ACT inhaler Inhale 1 puff into the lungs every 6 (six) hours as needed for wheezing or shortness of breath.     amLODipine (NORVASC) 5 MG tablet Take 5 mg by mouth daily.      atorvastatin (LIPITOR) 40 MG tablet Take 40 mg by mouth daily.      baclofen (LIORESAL) 10 MG tablet Take 10 mg by mouth 3 (three) times daily.     BREO ELLIPTA 100-25 MCG/INH AEPB INHALE 1 PUFFS  INTO THE LUNGS ONCE DAILY (Patient taking differently: Inhale 1 puff into the lungs daily.) 60 each 5   ceFAZolin (ANCEF) IVPB Inject 2 g into the vein every 8 (eight) hours. Indication:  Osteomyelitis First Dose: No Last Day of Therapy:  03/09/2021 Labs - Once weekly:  CBC/D and BMP, Labs - Every other week:  ESR and CRP Method of administration: IV Push Method of administration may be changed at the discretion of home infusion pharmacist based upon assessment of the patient and/or caregiver's ability to self-administer the medication ordered. 252 Units 0   celecoxib (CELEBREX) 200 MG capsule Take 200-400 mg by mouth daily as needed for mild pain.     cetirizine (ZYRTEC) 10 MG tablet Take 10 mg by mouth daily as needed for allergies.     fluticasone (FLONASE) 50 MCG/ACT nasal spray Place 1 spray into both nostrils 2 (two) times daily.      gabapentin (NEURONTIN) 300 MG capsule Take 600 mg by mouth at bedtime.     lidocaine (LIDODERM) 5 % Place 1 patch onto the skin daily. Remove & Discard patch within 12 hours or as directed by MD 15 patch 0   losartan (COZAAR) 50 MG tablet Take 50 mg by mouth daily.  methocarbamol (ROBAXIN) 500 MG tablet Take 1 tablet (500 mg total) by mouth every 6 (six) hours as needed for muscle spasms. 20 tablet 0   montelukast (SINGULAIR) 10 MG tablet Take 10 mg by mouth daily.      ondansetron (ZOFRAN) 4 MG tablet Take 4 mg by mouth every 8 (eight) hours as needed for nausea or vomiting.     oxyCODONE (OXYCONTIN) 15 mg 12 hr tablet Take 1 tablet (15 mg total) by mouth every 12 (twelve) hours. 10 tablet 0   oxyCODONE-acetaminophen (PERCOCET/ROXICET) 5-325 MG tablet Take 1 tablet by mouth every 6 (six) hours as needed for severe pain. 10 tablet 0   polyethylene glycol (MIRALAX / GLYCOLAX) 17 g packet Take 17 g by mouth daily as needed. 14 each 0   senna-docusate (SENOKOT-S) 8.6-50 MG tablet Take 1 tablet by mouth 2 (two) times daily. 20 tablet 0   SUMAtriptan (IMITREX)  50 MG tablet Take 50 mg by mouth daily as needed for migraine. May repeat 1 dose in an hour.     traZODone (DESYREL) 150 MG tablet Take 150 mg by mouth at bedtime.      venlafaxine XR (EFFEXOR-XR) 150 MG 24 hr capsule Take 150 mg by mouth every morning.      No facility-administered medications prior to visit.     Social History   Socioeconomic History   Marital status: Single    Spouse name: Not on file   Number of children: Not on file   Years of education: Not on file   Highest education level: Not on file  Occupational History   Occupation: Retired    Fish farm manager: RETIRED  Tobacco Use   Smoking status: Never   Smokeless tobacco: Never  Vaping Use   Vaping Use: Never used  Substance and Sexual Activity   Alcohol use: Yes    Alcohol/week: 7.0 standard drinks    Types: 7 Standard drinks or equivalent per week    Comment: scotch or wine maybe 1 a night   Drug use: Never   Sexual activity: Not on file  Other Topics Concern   Not on file  Social History Narrative   Single, lives alone   No children   OCCUPATION: retired, IT support   Social Determinants of Radio broadcast assistant Strain: Not on file  Food Insecurity: Not on file  Transportation Needs: Not on file  Physical Activity: Not on file  Stress: Not on file  Social Connections: Not on file  Intimate Partner Violence: Not on file      Review of Systems    Other ros negative   Objective:    BP 117/72   Pulse 75   Resp 16   Ht 5' 6"  (1.676 m)   Wt 200 lb (90.7 kg)   SpO2 98%   BMI 32.28 kg/m  Nursing note and vital signs reviewed.  Physical Exam    General/constitutional: no distress, pleasant -- in wheel chair for transport ease HEENT: Normocephalic, PER, Conj Clear, EOMI, Oropharynx clear Neck supple CV: rrr no mrg Lungs: clear to auscultation, normal respiratory effort Abd: Soft, Nontender Ext: no edema Skin: No Rash Neuro: nonfocal MSK: no peripheral joint  swelling/tenderness/warmth; back spines nontender   Central line presence: rue picc site no erythema/tenderness   Labs: Lab Results  Component Value Date   WBC 9.7 01/25/2021   HGB 9.4 (L) 01/25/2021   HCT 30.2 (L) 01/25/2021   MCV 91.8 01/25/2021   PLT 290 01/25/2021  Last metabolic panel Lab Results  Component Value Date   GLUCOSE 92 01/25/2021   NA 137 01/25/2021   K 3.8 01/25/2021   CL 100 01/25/2021   CO2 29 01/25/2021   BUN 12 01/25/2021   CREATININE 0.64 01/25/2021   GFRNONAA >60 01/25/2021   GFRAA 60 (L) 08/12/2019   CALCIUM 8.3 (L) 01/25/2021   PROT 7.3 01/25/2021   ALBUMIN 2.2 (L) 01/25/2021   BILITOT 0.6 01/25/2021   ALKPHOS 91 01/25/2021   AST 36 01/25/2021   ALT 19 01/25/2021   ANIONGAP 8 01/25/2021    Micro:  Serology:  Imaging: 8/16 mri lumbar spine 1. Increased T2 signal about and disruption of the left L4-L5 facets, with increased T2 signal in posterior L5 vertebral body, left pedicle, and surrounding soft tissues. Edema and osseous destruction cause mild spinal canal narrowing. This could represent acute fracture or infection. Correlate with history of trauma and consider CT for further osseous evaluation. 2. L2-L3 mild canal, moderate left neural foraminal narrowing and mild-to-moderate right neural foraminal narrowing.   8/18 tte  1. Left ventricular ejection fraction, by estimation, is 60 to 65%. The  left ventricle has normal function. The left ventricle has no regional  wall motion abnormalities. There is moderate asymmetric left ventricular  hypertrophy of the basal-septal  segment. Left ventricular diastolic parameters were normal.   2. Right ventricular systolic function is normal. The right ventricular  size is normal. There is normal pulmonary artery systolic pressure. The  estimated right ventricular systolic pressure is 35.0 mmHg.   3. The mitral valve is normal in structure. Trivial mitral valve  regurgitation. No evidence  of mitral stenosis.   4. The aortic valve was not well visualized. Aortic valve regurgitation  is not visualized. No aortic stenosis is present.   5. The inferior vena cava is normal in size with <50% respiratory  variability, suggesting right atrial pressure of 8 mmHg.   Conclusion(s)/Recommendation(s): No vegetation seen. If high clinical  suspicion for endocarditis, recommend TEE.    8/22 abdpelv ct with contrast 1. Progressive osteomyelitis at the left L4-5 facet joint, with evidence of ascending infection and probable epidural abscess and paraspinal phlegmon from L1 through L4. Follow-up MRI lumbar spine with and without contrast is recommended for more detailed evaluation. 2. Bilateral nonobstructing renal calculi. No evidence of obstructive uropathy within either kidney. 3. Indeterminate hypodensities right kidney, possibly hyperdense cysts, slightly increased since prior study. Follow-up renal MRI or CT could be performed if not previously evaluated. 4. Minimal gallbladder sludge.  No cholelithiasis or cholecystitis. 5.  Aortic Atherosclerosis  8/24 tee no endocarditis  Assessment & Plan:   Problem List Items Addressed This Visit   None     No orders of the defined types were placed in this encounter.    Mssa bacteremia Lumbar spine osteomyelitis   Lines:  Peripheral iv's   Abx: 8/18-c cefazolin   8/16-18 vanc                                                        Assessment: 70 yo female lumbago, OA, htn/hlp, asthma, admitted 8/16 with progressive 7 day acute on chronic lower back pain, found to have mri imaging L4-5 vertebral om/discitis and mssa bacteremia   8/17 & 8/19 bcx mssa; 8/22 bcx ngtd 8/17 IR aspirate (  left L4-5 facet joint) cx ngtd   Source usually unclear on these patients. But so far no obvious metastatic site of infection. Presumably this is hematogenous spread of mssa bacteremia.    8/18 tte no obvious valve vegetation; 8/24 tee  negative for endocarditis   8/22 Ct of abd/pelv showed worsening lumbar spine epidural abscess. Clinically no LE neurological deficit or bladder/bowel incontinence. Nsg reviewed cases and deemed no surgical intervention needed at this time   Overall ipmroving. Back pain minimal. LE spasm much improved. Pending disposition   ------------- 09/7 id assessment Doing well Some issues with nursing home timing of abx administration/or actual administration itself Awaiting labs from them Tenative date of stopping abx (iv) is 10/05 -- will see based on labs if needing to extend coverage with pills  There is no spinal abscess or other occult abscess    Plan: Await opat labs from nursing home F/u in around 4 weeks Remove picc on 10/05 Weekly cbc, cmp, and crp  Follow-up: Return in about 4 weeks (around 03/08/2021).      Jabier Mutton, [place] for [place] [phone]  pager   [phone] cell 02/08/2021, 4:17 PM

## 2021-02-18 ENCOUNTER — Inpatient Hospital Stay (HOSPITAL_COMMUNITY)
Admission: EM | Admit: 2021-02-18 | Discharge: 2021-02-28 | DRG: 659 | Disposition: A | Discharge status: Skilled Nursing Facility | Payer: Medicare PPO | Attending: Internal Medicine | Admitting: Internal Medicine

## 2021-02-18 ENCOUNTER — Observation Stay (HOSPITAL_COMMUNITY): Payer: Medicare PPO

## 2021-02-18 ENCOUNTER — Encounter (HOSPITAL_COMMUNITY): Payer: Self-pay | Admitting: Emergency Medicine

## 2021-02-18 ENCOUNTER — Other Ambulatory Visit: Payer: Self-pay

## 2021-02-18 ENCOUNTER — Emergency Department (HOSPITAL_COMMUNITY): Payer: Medicare PPO

## 2021-02-18 DIAGNOSIS — K6812 Psoas muscle abscess: Secondary | ICD-10-CM | POA: Diagnosis present

## 2021-02-18 DIAGNOSIS — M4646 Discitis, unspecified, lumbar region: Secondary | ICD-10-CM | POA: Diagnosis present

## 2021-02-18 DIAGNOSIS — Z881 Allergy status to other antibiotic agents status: Secondary | ICD-10-CM

## 2021-02-18 DIAGNOSIS — Z880 Allergy status to penicillin: Secondary | ICD-10-CM

## 2021-02-18 DIAGNOSIS — G062 Extradural and subdural abscess, unspecified: Secondary | ICD-10-CM | POA: Diagnosis present

## 2021-02-18 DIAGNOSIS — M4626 Osteomyelitis of vertebra, lumbar region: Secondary | ICD-10-CM | POA: Diagnosis present

## 2021-02-18 DIAGNOSIS — Z66 Do not resuscitate: Secondary | ICD-10-CM | POA: Diagnosis not present

## 2021-02-18 DIAGNOSIS — Z87442 Personal history of urinary calculi: Secondary | ICD-10-CM

## 2021-02-18 DIAGNOSIS — Z79899 Other long term (current) drug therapy: Secondary | ICD-10-CM

## 2021-02-18 DIAGNOSIS — G8929 Other chronic pain: Secondary | ICD-10-CM | POA: Diagnosis present

## 2021-02-18 DIAGNOSIS — Z7951 Long term (current) use of inhaled steroids: Secondary | ICD-10-CM

## 2021-02-18 DIAGNOSIS — Z792 Long term (current) use of antibiotics: Secondary | ICD-10-CM

## 2021-02-18 DIAGNOSIS — N132 Hydronephrosis with renal and ureteral calculous obstruction: Principal | ICD-10-CM | POA: Diagnosis present

## 2021-02-18 DIAGNOSIS — Z8249 Family history of ischemic heart disease and other diseases of the circulatory system: Secondary | ICD-10-CM

## 2021-02-18 DIAGNOSIS — N135 Crossing vessel and stricture of ureter without hydronephrosis: Secondary | ICD-10-CM | POA: Diagnosis present

## 2021-02-18 DIAGNOSIS — E876 Hypokalemia: Secondary | ICD-10-CM | POA: Diagnosis not present

## 2021-02-18 DIAGNOSIS — E785 Hyperlipidemia, unspecified: Secondary | ICD-10-CM | POA: Diagnosis present

## 2021-02-18 DIAGNOSIS — Z8619 Personal history of other infectious and parasitic diseases: Secondary | ICD-10-CM

## 2021-02-18 DIAGNOSIS — I10 Essential (primary) hypertension: Secondary | ICD-10-CM | POA: Diagnosis present

## 2021-02-18 DIAGNOSIS — R112 Nausea with vomiting, unspecified: Secondary | ICD-10-CM | POA: Diagnosis not present

## 2021-02-18 DIAGNOSIS — J4530 Mild persistent asthma, uncomplicated: Secondary | ICD-10-CM | POA: Diagnosis present

## 2021-02-18 DIAGNOSIS — Z885 Allergy status to narcotic agent status: Secondary | ICD-10-CM

## 2021-02-18 DIAGNOSIS — Z803 Family history of malignant neoplasm of breast: Secondary | ICD-10-CM

## 2021-02-18 DIAGNOSIS — M4636 Infection of intervertebral disc (pyogenic), lumbar region: Secondary | ICD-10-CM | POA: Diagnosis present

## 2021-02-18 DIAGNOSIS — M4620 Osteomyelitis of vertebra, site unspecified: Secondary | ICD-10-CM

## 2021-02-18 DIAGNOSIS — Z20822 Contact with and (suspected) exposure to covid-19: Secondary | ICD-10-CM | POA: Diagnosis present

## 2021-02-18 DIAGNOSIS — G2581 Restless legs syndrome: Secondary | ICD-10-CM | POA: Diagnosis present

## 2021-02-18 LAB — URINALYSIS, ROUTINE W REFLEX MICROSCOPIC
Bacteria, UA: NONE SEEN
Bilirubin Urine: NEGATIVE
Glucose, UA: NEGATIVE mg/dL
Ketones, ur: 20 mg/dL — AB
Leukocytes,Ua: NEGATIVE
Nitrite: NEGATIVE
Protein, ur: NEGATIVE mg/dL
Specific Gravity, Urine: 1.009 (ref 1.005–1.030)
pH: 8 (ref 5.0–8.0)

## 2021-02-18 LAB — COMPREHENSIVE METABOLIC PANEL
ALT: 5 U/L (ref 0–44)
AST: 13 U/L — ABNORMAL LOW (ref 15–41)
Albumin: 2.9 g/dL — ABNORMAL LOW (ref 3.5–5.0)
Alkaline Phosphatase: 100 U/L (ref 38–126)
Anion gap: 11 (ref 5–15)
BUN: 8 mg/dL (ref 8–23)
CO2: 23 mmol/L (ref 22–32)
Calcium: 8.1 mg/dL — ABNORMAL LOW (ref 8.9–10.3)
Chloride: 103 mmol/L (ref 98–111)
Creatinine, Ser: 0.53 mg/dL (ref 0.44–1.00)
GFR, Estimated: >60 mL/min (ref >60)
Glucose, Bld: 165 mg/dL — ABNORMAL HIGH (ref 70–99)
Potassium: 2.5 mmol/L — CL (ref 3.5–5.1)
Sodium: 137 mmol/L (ref 135–145)
Total Bilirubin: 0.8 mg/dL (ref 0.3–1.2)
Total Protein: 7.6 g/dL (ref 6.5–8.1)

## 2021-02-18 LAB — CBC WITH DIFFERENTIAL/PLATELET
Abs Immature Granulocytes: 0.05 10*3/uL (ref 0.00–0.07)
Basophils Absolute: 0 10*3/uL (ref 0.0–0.1)
Basophils Relative: 0 %
Eosinophils Absolute: 0 10*3/uL (ref 0.0–0.5)
Eosinophils Relative: 0 %
HCT: 29.3 % — ABNORMAL LOW (ref 36.0–46.0)
Hemoglobin: 9.2 g/dL — ABNORMAL LOW (ref 12.0–15.0)
Immature Granulocytes: 1 %
Lymphocytes Relative: 6 %
Lymphs Abs: 0.4 10*3/uL — ABNORMAL LOW (ref 0.7–4.0)
MCH: 28 pg (ref 26.0–34.0)
MCHC: 31.4 g/dL (ref 30.0–36.0)
MCV: 89.1 fL (ref 80.0–100.0)
Monocytes Absolute: 0.5 10*3/uL (ref 0.1–1.0)
Monocytes Relative: 7 %
Neutro Abs: 5.9 10*3/uL (ref 1.7–7.7)
Neutrophils Relative %: 86 %
Platelets: 225 10*3/uL (ref 150–400)
RBC: 3.29 MIL/uL — ABNORMAL LOW (ref 3.87–5.11)
RDW: 15.6 % — ABNORMAL HIGH (ref 11.5–15.5)
WBC: 6.8 10*3/uL (ref 4.0–10.5)
nRBC: 0 % (ref 0.0–0.2)

## 2021-02-18 LAB — RESP PANEL BY RT-PCR (FLU A&B, COVID) ARPGX2
Influenza A by PCR: NEGATIVE
Influenza B by PCR: NEGATIVE
SARS Coronavirus 2 by RT PCR: NEGATIVE

## 2021-02-18 LAB — LIPASE, BLOOD: Lipase: 27 U/L (ref 11–51)

## 2021-02-18 LAB — MAGNESIUM: Magnesium: 1.6 mg/dL — ABNORMAL LOW (ref 1.7–2.4)

## 2021-02-18 MED ORDER — POTASSIUM CHLORIDE CRYS ER 20 MEQ PO TBCR: 40.0000 meq | EXTENDED_RELEASE_TABLET | ORAL | Status: DC

## 2021-02-18 MED ORDER — CEFAZOLIN SODIUM-DEXTROSE 2-4 GM/100ML-% IV SOLN
2.0000 g | Freq: Three times a day (TID) | INTRAVENOUS | Status: DC
  Administered 2021-02-18 – 2021-02-28 (×30): 2 g via INTRAVENOUS
  Filled 2021-02-18 (×30): qty 100

## 2021-02-18 MED ORDER — MONTELUKAST SODIUM 10 MG PO TABS
10.0000 mg | ORAL_TABLET | Freq: Every day | ORAL | Status: DC
  Administered 2021-02-19 – 2021-02-28 (×10): 10 mg via ORAL
  Filled 2021-02-18 (×10): qty 1

## 2021-02-18 MED ORDER — LOSARTAN POTASSIUM 50 MG PO TABS
50.0000 mg | ORAL_TABLET | Freq: Every day | ORAL | Status: DC
  Administered 2021-02-19 – 2021-02-20 (×2): 50 mg via ORAL
  Filled 2021-02-18: qty 1
  Filled 2021-02-18: qty 2

## 2021-02-18 MED ORDER — LABETALOL HCL 5 MG/ML IV SOLN
10.0000 mg | Freq: Once | INTRAVENOUS | Status: AC
  Administered 2021-02-18: 10 mg via INTRAVENOUS
  Filled 2021-02-18: qty 4

## 2021-02-18 MED ORDER — SODIUM CHLORIDE 0.9 % IV BOLUS
1000.0000 mL | Freq: Once | INTRAVENOUS | Status: AC
  Administered 2021-02-18: 1000 mL via INTRAVENOUS

## 2021-02-18 MED ORDER — POTASSIUM CHLORIDE 10 MEQ/100ML IV SOLN
10.0000 meq | INTRAVENOUS | Status: AC
  Administered 2021-02-18 (×3): 10 meq via INTRAVENOUS
  Filled 2021-02-18 (×3): qty 100

## 2021-02-18 MED ORDER — PROCHLORPERAZINE EDISYLATE 10 MG/2ML IJ SOLN
10.0000 mg | Freq: Once | INTRAMUSCULAR | Status: AC
  Administered 2021-02-18: 10 mg via INTRAVENOUS
  Filled 2021-02-18: qty 2

## 2021-02-18 MED ORDER — MAGNESIUM SULFATE 2 GM/50ML IV SOLN: 2.0000 g | Freq: Once | INTRAVENOUS | Status: DC

## 2021-02-18 MED ORDER — AMLODIPINE BESYLATE 5 MG PO TABS
5.0000 mg | ORAL_TABLET | Freq: Every day | ORAL | Status: DC
  Administered 2021-02-19: 5 mg via ORAL
  Filled 2021-02-18: qty 1

## 2021-02-18 MED ORDER — PROCHLORPERAZINE EDISYLATE 10 MG/2ML IJ SOLN
10.0000 mg | Freq: Four times a day (QID) | INTRAMUSCULAR | Status: DC | PRN
  Administered 2021-02-18 – 2021-02-21 (×5): 10 mg via INTRAVENOUS
  Filled 2021-02-18 (×5): qty 2

## 2021-02-18 MED ORDER — SODIUM CHLORIDE 0.9 % IV SOLN
8.0000 mg | Freq: Four times a day (QID) | INTRAVENOUS | Status: DC | PRN
  Administered 2021-02-18: 8 mg via INTRAVENOUS
  Filled 2021-02-18 (×2): qty 4

## 2021-02-18 MED ORDER — ATORVASTATIN CALCIUM 40 MG PO TABS
40.0000 mg | ORAL_TABLET | Freq: Every day | ORAL | Status: DC
  Administered 2021-02-19 – 2021-02-28 (×10): 40 mg via ORAL
  Filled 2021-02-18 (×10): qty 1

## 2021-02-18 MED ORDER — SODIUM CHLORIDE 0.9 % IV SOLN: INTRAVENOUS | Status: DC

## 2021-02-18 MED ORDER — CELECOXIB 200 MG PO CAPS
200.0000 mg | ORAL_CAPSULE | Freq: Every day | ORAL | Status: DC | PRN
  Administered 2021-02-21: 400 mg via ORAL
  Administered 2021-02-22: 200 mg via ORAL
  Administered 2021-02-24 – 2021-02-27 (×4): 400 mg via ORAL
  Filled 2021-02-18 (×6): qty 2

## 2021-02-18 MED ORDER — IOHEXOL 350 MG/ML SOLN
80.0000 mL | Freq: Once | INTRAVENOUS | Status: AC | PRN
  Administered 2021-02-18: 80 mL via INTRAVENOUS

## 2021-02-18 MED ORDER — METOCLOPRAMIDE HCL 5 MG/ML IJ SOLN
10.0000 mg | Freq: Once | INTRAMUSCULAR | Status: AC
  Administered 2021-02-18: 10 mg via INTRAVENOUS
  Filled 2021-02-18: qty 2

## 2021-02-18 MED ORDER — OXYCODONE-ACETAMINOPHEN 5-325 MG PO TABS
1.0000 | ORAL_TABLET | Freq: Four times a day (QID) | ORAL | Status: DC | PRN
  Administered 2021-02-18 – 2021-02-28 (×29): 1 via ORAL
  Filled 2021-02-18 (×30): qty 1

## 2021-02-18 MED ORDER — POTASSIUM CHLORIDE 10 MEQ/100ML IV SOLN
10.0000 meq | Freq: Once | INTRAVENOUS | Status: AC
  Administered 2021-02-18: 10 meq via INTRAVENOUS
  Filled 2021-02-18: qty 100

## 2021-02-18 MED ORDER — FLUTICASONE PROPIONATE 50 MCG/ACT NA SUSP
1.0000 | Freq: Two times a day (BID) | NASAL | Status: DC
Start: 2021-02-18 — End: 2021-02-28
  Administered 2021-02-19 – 2021-02-24 (×3): 1 via NASAL
  Filled 2021-02-18: qty 16

## 2021-02-18 MED ORDER — VENLAFAXINE HCL ER 150 MG PO CP24
150.0000 mg | ORAL_CAPSULE | Freq: Every morning | ORAL | Status: DC
  Administered 2021-02-19 – 2021-02-28 (×9): 150 mg via ORAL
  Filled 2021-02-18 (×5): qty 1
  Filled 2021-02-18: qty 2
  Filled 2021-02-18 (×3): qty 1

## 2021-02-18 MED ORDER — ENOXAPARIN SODIUM 40 MG/0.4ML IJ SOSY
40.0000 mg | PREFILLED_SYRINGE | INTRAMUSCULAR | Status: DC
  Administered 2021-02-18 – 2021-02-20 (×3): 40 mg via SUBCUTANEOUS
  Filled 2021-02-18 (×3): qty 0.4

## 2021-02-18 MED ORDER — IPRATROPIUM-ALBUTEROL 0.5-2.5 (3) MG/3ML IN SOLN: 3.0000 mL | Freq: Four times a day (QID) | RESPIRATORY_TRACT | Status: DC | PRN

## 2021-02-18 MED ORDER — SUMATRIPTAN SUCCINATE 50 MG PO TABS
50.0000 mg | ORAL_TABLET | Freq: Every day | ORAL | Status: DC | PRN
  Filled 2021-02-18: qty 1

## 2021-02-18 MED ORDER — LIDOCAINE 5 % EX PTCH: 1.0000 | MEDICATED_PATCH | CUTANEOUS | Status: DC

## 2021-02-18 MED ORDER — POTASSIUM CHLORIDE CRYS ER 20 MEQ PO TBCR
40.0000 meq | EXTENDED_RELEASE_TABLET | ORAL | Status: AC
Start: 2021-02-18 — End: 2021-02-18
  Administered 2021-02-18: 40 meq via ORAL
  Filled 2021-02-18 (×2): qty 2

## 2021-02-18 MED ORDER — METHOCARBAMOL 500 MG PO TABS
500.0000 mg | ORAL_TABLET | Freq: Four times a day (QID) | ORAL | Status: DC | PRN
  Administered 2021-02-19 – 2021-02-25 (×12): 500 mg via ORAL
  Filled 2021-02-18 (×12): qty 1

## 2021-02-18 MED ORDER — LABETALOL HCL 5 MG/ML IV SOLN
10.0000 mg | INTRAVENOUS | Status: DC | PRN
  Administered 2021-02-20: 10 mg via INTRAVENOUS
  Filled 2021-02-18: qty 4

## 2021-02-18 MED ORDER — TRAZODONE HCL 50 MG PO TABS
150.0000 mg | ORAL_TABLET | Freq: Every day | ORAL | Status: DC
  Administered 2021-02-18 – 2021-02-27 (×10): 150 mg via ORAL
  Filled 2021-02-18 (×2): qty 1
  Filled 2021-02-18: qty 2
  Filled 2021-02-18 (×7): qty 1

## 2021-02-18 MED ORDER — LABETALOL HCL 5 MG/ML IV SOLN: 20.0000 mg | Freq: Once | INTRAVENOUS | Status: DC

## 2021-02-18 MED ORDER — ONDANSETRON HCL 4 MG/2ML IJ SOLN: 4.0000 mg | Freq: Four times a day (QID) | INTRAMUSCULAR | Status: DC | PRN

## 2021-02-18 MED ORDER — ONDANSETRON HCL 4 MG/2ML IJ SOLN
4.0000 mg | Freq: Once | INTRAMUSCULAR | Status: AC
  Administered 2021-02-18: 4 mg via INTRAVENOUS
  Filled 2021-02-18: qty 2

## 2021-02-18 MED ORDER — GABAPENTIN 300 MG PO CAPS
600.0000 mg | ORAL_CAPSULE | Freq: Every day | ORAL | Status: DC
  Administered 2021-02-18 – 2021-02-27 (×10): 600 mg via ORAL
  Filled 2021-02-18 (×10): qty 2

## 2021-02-18 NOTE — ED Provider Notes (Signed)
MSE note.  Patient presents with nausea and vomiting for 1 day and mild abdominal discomfort after she vomits.  She is presently getting cefazolin IV for osteomyelitis.  Labs and medicines ordered.   Bethann Berkshire, MD 02/18/21 1402

## 2021-02-18 NOTE — ED Provider Notes (Signed)
[facility] DEPT Provider Note   CSN: [id number] Arrival date & time: 02/18/21  1322     History Chief Complaint  Patient presents with   Emesis    Haley Singh is a 70 y.o. female.  Patient states she has been nauseated with some vomiting for last couple days.  She has osteomyelitis of L4 and is taking Ancef IV.  She was at a rehab center getting Ancef and then went home 2 days ago and continue the Ancef but she believes it is a different NSAID and thinks this is the reason that she has been nauseated no fevers no chills occasional vomiting  The history is provided by the patient and medical records. No language interpreter was used.  Emesis Severity:  Mild Timing:  Intermittent Able to tolerate:  Liquids Progression:  Unchanged Chronicity:  New Recent urination:  Normal Context: not post-tussive   Relieved by:  Nothing Worsened by:  Nothing Ineffective treatments:  None tried Associated symptoms: no abdominal pain, no cough, no diarrhea and no headaches   Risk factors: no alcohol use       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   Hyperlipidemia    Hypertension    followed by pcp  (11-10-2019 pt stated never had a stress test)   Insomnia    MDD (major depressive disorder)    Mild persistent asthma    followed by pcp and dr Bernita Raisin (pulmonologist)   RLS (restless legs syndrome)     Patient Active Problem List   Diagnosis Date Noted   Intractable nausea and vomiting 02/18/2021   MSSA bacteremia    Discitis of lumbar region    Osteomyelitis ([place]) 01/17/2021   Mild persistent asthma without complication 91/63/8466   OSA (obstructive sleep apnea) 03/03/2014    Past Surgical History:  Procedure  Laterality Date   BUBBLE STUDY  01/25/2021   Procedure: BUBBLE STUDY;  Surgeon: Buford Dresser, MD;  Location: [facility];  Service: Cardiovascular;;   CATARACT EXTRACTION W/ INTRAOCULAR LENS  IMPLANT, BILATERAL  2009; 2010   CYSTOSCOPY/URETEROSCOPY/HOLMIUM LASER/STENT PLACEMENT Right 08/26/2019   Procedure: CYSTOSCOPY/RETROGRADE/URETEROSCOPY/HOLMIUM LASER/STENT PLACEMENT;  Surgeon: Ceasar Mons, MD;  Location: [facility];  Service: Urology;  Laterality: Right;   CYSTOSCOPY/URETEROSCOPY/HOLMIUM LASER/STENT PLACEMENT Left 11/17/2019   Procedure: CYSTOSCOPY/URETEROSCOPY, RETROGRADE,LASER LITHOTRIPSY, STENT PLACEMENT;  Surgeon: Ceasar Mons, MD;  Location: [facility];  Service: Urology;  Laterality: Left;   EXPLORATORY LAPAROTOMY W/ BOWEL RESECTION  01-15-2004  @[facility]    small bowel resection for stricture/ benzoar   EXTRACORPOREAL SHOCK WAVE LITHOTRIPSY Right 03/27/2018   Procedure: EXTRACORPOREAL SHOCK WAVE LITHOTRIPSY (ESWL);  Surgeon: Ardis Hughs, MD;  Location: [facility];  Service: Urology;  Laterality: Right;   EXTRACORPOREAL SHOCK WAVE LITHOTRIPSY  x2 2008;  2009;  2015   HOLMIUM LASER APPLICATION Left 5/99/3570   Procedure: HOLMIUM LASER APPLICATION;  Surgeon: Ceasar Mons, MD;  Location: [facility];  Service: Urology;  Laterality: Left;   ORIF ANKLE FRACTURE Right 09/ 2008 @[facility]    per pt has retained hardware   TEE WITHOUT CARDIOVERSION N/A 01/25/2021   Procedure: TRANSESOPHAGEAL ECHOCARDIOGRAM (TEE);  Surgeon: Buford Dresser, MD;  Location: [facility];  Service: Cardiovascular;  Laterality: N/A;   TONSILLECTOMY  age 8     OB History   No obstetric history on file.     Family History  Problem Relation Age of Onset   Breast cancer Maternal Aunt    Heart disease Father    Heart Problems Father     Social History   Tobacco Use   Smoking status: Never   Smokeless tobacco:  Never  Vaping Use   Vaping Use: Never used  Substance Use Topics   Alcohol use: Yes    Alcohol/week: 7.0 standard drinks    Types: 7 Standard drinks or equivalent per week    Comment: scotch or wine maybe 1 a night   Drug use: Never    Home Medications Prior to Admission medications   Medication Sig Start Date End Date Taking? Authorizing Provider  albuterol (PROVENTIL HFA;VENTOLIN HFA) 108 (90 BASE) MCG/ACT inhaler Inhale 1 puff into the lungs every 6 (six) hours as needed for wheezing or shortness of breath.    [provider]  amLODipine (NORVASC) 5 MG tablet Take 5 mg by mouth daily.     [provider]  atorvastatin (LIPITOR) 40 MG tablet Take 40 mg by mouth daily.     [provider]  baclofen (LIORESAL) 10 MG tablet Take 10 mg by mouth 3 (three) times daily. 01/24/21   [provider]  BREO ELLIPTA 100-25 MCG/INH AEPB INHALE 1 PUFFS INTO THE LUNGS ONCE DAILY Patient taking differently: Inhale 1 puff into the lungs daily. 08/04/20   Mannam, Hart Robinsons, MD  ceFAZolin (ANCEF) IVPB Inject 2 g into the vein every 8 (eight) hours. Indication:  Osteomyelitis First Dose: No Last Day of Therapy:  03/09/2021 Labs - Once weekly:  CBC/D and BMP, Labs - Every other week:  ESR and CRP Method of administration: IV Push Method of administration may be changed at the discretion of home infusion pharmacist based upon assessment of the patient and/or caregiver's ability to self-administer the medication ordered. 01/27/21 03/10/21  Aline August, MD  celecoxib (CELEBREX) 200 MG capsule Take 200-400 mg by mouth daily as needed for mild pain.    [provider]  cetirizine (ZYRTEC) 10 MG tablet Take 10 mg by mouth daily as needed for allergies.    [provider]  fluticasone (FLONASE) 50 MCG/ACT nasal spray Place 1 spray into both nostrils 2 (two) times daily.     [provider]  gabapentin (NEURONTIN) 300 MG capsule Take 600 mg by mouth at  bedtime.    [provider]  lidocaine (LIDODERM) 5 % Place 1 patch onto the skin daily. Remove & Discard patch within 12 hours or as directed by MD 01/27/21   Aline August, MD  losartan (COZAAR) 50 MG tablet Take 50 mg by mouth daily.  04/25/18   [provider]  methocarbamol (ROBAXIN) 500 MG tablet Take 1 tablet (500 mg total) by mouth every 6 (six) hours as needed for muscle spasms. 01/27/21   Aline August, MD  montelukast (SINGULAIR) 10 MG tablet Take 10 mg by mouth daily.     [provider]  ondansetron (ZOFRAN) 4 MG tablet Take 4 mg by mouth every 8 (eight) hours as needed for nausea or vomiting.    [provider]  oxyCODONE (OXYCONTIN) 15 mg 12 hr tablet Take 1 tablet (15 mg total) by mouth every 12 (twelve) hours. 01/27/21   Aline August, MD  oxyCODONE-acetaminophen (PERCOCET/ROXICET) 5-325 MG tablet Take 1 tablet by  mouth every 6 (six) hours as needed for severe pain. 01/27/21   Aline August, MD  polyethylene glycol (MIRALAX / GLYCOLAX) 17 g packet Take 17 g by mouth daily as needed. 01/27/21   Aline August, MD  senna-docusate (SENOKOT-S) 8.6-50 MG tablet Take 1 tablet by mouth 2 (two) times daily. 01/27/21   Aline August, MD  SUMAtriptan (IMITREX) 50 MG tablet Take 50 mg by mouth daily as needed for migraine. May repeat 1 dose in an hour. 12/07/20   [provider]  traZODone (DESYREL) 150 MG tablet Take 150 mg by mouth at bedtime.     [provider]  venlafaxine XR (EFFEXOR-XR) 150 MG 24 hr capsule Take 150 mg by mouth every morning.     [provider]    Allergies    Oxycontin [oxycodone], Penicillins, and Tetracyclines & related  Review of Systems   Review of Systems  Constitutional:  Negative for appetite change and fatigue.  HENT:  Negative for congestion, ear discharge and sinus pressure.   Eyes:  Negative for discharge.  Respiratory:  Negative for cough.   Cardiovascular:  Negative for chest pain.   Gastrointestinal:  Positive for nausea and vomiting. Negative for abdominal pain and diarrhea.  Genitourinary:  Negative for frequency and hematuria.  Musculoskeletal:  Negative for back pain.  Skin:  Negative for rash.  Neurological:  Negative for seizures and headaches.  Psychiatric/Behavioral:  Negative for hallucinations.    Physical Exam Updated Vital Signs BP (!) 171/79   Pulse 90   Temp 99.3 F (37.4 C) (Oral)   Resp 20   SpO2 99%   Physical Exam Vitals and nursing note reviewed.  Constitutional:      Appearance: She is well-developed.  HENT:     Head: Normocephalic.     Nose: Nose normal.  Eyes:     General: No scleral icterus.    Conjunctiva/sclera: Conjunctivae normal.  Neck:     Thyroid: No thyromegaly.  Cardiovascular:     Rate and Rhythm: Normal rate and regular rhythm.     Heart sounds: No murmur heard.   No friction rub. No gallop.  Pulmonary:     Breath sounds: No stridor. No wheezing or rales.  Chest:     Chest wall: No tenderness.  Abdominal:     General: There is no distension.     Tenderness: There is no abdominal tenderness. There is no rebound.  Musculoskeletal:        General: Normal range of motion.     Cervical back: Neck supple.  Lymphadenopathy:     Cervical: No cervical adenopathy.  Skin:    Findings: No erythema or rash.  Neurological:     Mental Status: She is alert and oriented to person, place, and time.     Motor: No abnormal muscle tone.     Coordination: Coordination normal.  Psychiatric:        Behavior: Behavior normal.    ED Results / Procedures / Treatments   Labs (all labs ordered are listed, but only abnormal results are displayed) Labs Reviewed  CBC WITH DIFFERENTIAL/PLATELET - Abnormal; Notable for the following components:      Result Value   RBC 3.29 (*)    Hemoglobin 9.2 (*)    HCT 29.3 (*)    RDW 15.6 (*)    Lymphs Abs 0.4 (*)    All other components within normal limits  COMPREHENSIVE METABOLIC PANEL -  Abnormal; Notable for the following components:  Potassium 2.5 (*)    Glucose, Bld 165 (*)    Calcium 8.1 (*)    Albumin 2.9 (*)    AST 13 (*)    All other components within normal limits  URINALYSIS, ROUTINE W REFLEX MICROSCOPIC - Abnormal; Notable for the following components:   Color, Urine STRAW (*)    Hgb urine dipstick MODERATE (*)    Ketones, ur 20 (*)    All other components within normal limits  LIPASE, BLOOD    EKG None  Radiology DG ABD ACUTE 2+V W 1V CHEST  Result Date: 02/18/2021 CLINICAL DATA:  Nausea and vomiting with abdominal pain. EXAM: DG ABDOMEN ACUTE WITH 1 VIEW CHEST COMPARISON:  CT abdomen and pelvis 01/23/2021. chest x-ray 12/16/2020. FINDINGS: Right upper extremity PICC terminates over the mid SVC. There is some linear areas of scarring or atelectasis in the mid and lower lungs. There is no focal lung consolidation, pleural effusion or pneumothorax identified. Bowel gas pattern is nonobstructive. Bowel anastomosis is seen in the right abdomen. Overall, there is a paucity of bowel gas. Stomach is nondilated. There is no free air under the diaphragm. Bilateral renal calculi are again noted. There is a calcified uterine fibroid in the pelvis, unchanged. Degenerative changes affect the spine and hips. IMPRESSION: 1. Nonobstructive, nonspecific bowel gas pattern. Overall, there is a paucity of bowel gas which can be seen with fluid-filled bowel. Please correlate clinically. 2. Bilateral renal calculi. 3. No acute cardiopulmonary process. Electronically Signed   By: Ronney Asters M.D.   On: 02/18/2021 16:27    Procedures Procedures   Medications Ordered in ED Medications  potassium chloride 10 mEq in 100 mL IVPB (10 mEq Intravenous New Bag/Given 02/18/21 1621)  sodium chloride 0.9 % bolus 1,000 mL (0 mLs Intravenous Stopped 02/18/21 1641)  ondansetron (ZOFRAN) injection 4 mg (4 mg Intravenous Given 02/18/21 1416)  metoCLOPramide (REGLAN) injection 10 mg (10 mg  Intravenous Given 02/18/21 1509)  potassium chloride 10 mEq in 100 mL IVPB (0 mEq Intravenous Stopped 02/18/21 1641)  prochlorperazine (COMPAZINE) injection 10 mg (10 mg Intravenous Given 02/18/21 1635)    ED Course  I have reviewed the triage vital signs and the nursing notes.  Pertinent labs & imaging results that were available during my care of the patient were reviewed by me and considered in my medical decision making (see chart for details). CRITICAL CARE Performed by: Milton Ferguson Total critical care time: 45 minutes Critical care time was exclusive of separately billable procedures and treating other patients. Critical care was necessary to treat or prevent imminent or life-threatening deterioration. Critical care was time spent personally by me on the following activities: development of treatment plan with patient and/or surrogate as well as nursing, discussions with consultants, evaluation of patient's response to treatment, examination of patient, obtaining history from patient or surrogate, ordering and performing treatments and interventions, ordering and review of laboratory studies, ordering and review of radiographic studies, pulse oximetry and re-evaluation of patient's condition. Patient with nausea weakness and hypokalemia.  She will be given IV potassium and be admitted to medicine   MDM Rules/Calculators/A&P                          Patient with osteomyelitis of L4 with treatment with Ancef.  Patient is nauseated and hypokalemic and she is given IV potassium but will be admitted Final Clinical Impression(s) / ED Diagnoses Final diagnoses:  Hypokalemia    Rx / DC  Orders ED Discharge Orders     None        Milton Ferguson, MD 02/18/21 [phone]

## 2021-02-18 NOTE — H&P (Signed)
History and Physical    Haley Singh PPI:[id number] DOB: September 07, 1950 DOA: 02/18/2021  PCP: Katherina Mires, MD   Patient coming from: Home    Chief Complaint: Nausea, vomiting  HPI: Haley Singh is a 71 y.o. 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  She was having nausea since last Friday.  She started intractable vomiting since this morning..  Patient also reported having some loose stools today.  She has not been unable to tolerate anything by mouth, has been very weak.  Patient was recently discharged from skilled nursing facility, [facility], to home on 9/15. Patient was discharged from here to skilled nursing facility on 01/27/2021 following hospitalization here during which she was managed for L4-L5 vertebral osteomyelitis/discitis.  Patient was discharged with IV cefazolin 3 times a day to be continued till 03/08/2021.  She was following with infectious disease. Patient lives alone. Patient seen and examined the bedside in the emergency department this afternoon.  During my evaluation, she was found to be weak, lying in bed, hypertensive, having intractable nausea and vomiting.  She denies any fever, chills, headache, chest pain, shortness of breath, palpitations, abdominal pain, hematochezia, melena, dysuria.   ED Course: Found to be severely hypertensive during emergency department stay.  Lab work showed potassium of 2.5.  She was admitted for the management of intractable nausea and vomiting along with loose stools.  Review of Systems: As per HPI otherwise 10 point review of systems negative.    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   Hyperlipidemia    Hypertension    followed by pcp  (11-10-2019 pt stated never had a stress test)   Insomnia    MDD (major depressive disorder)    Mild persistent asthma    followed by pcp and dr Bernita Raisin (pulmonologist)   RLS (restless legs syndrome)     Past Surgical History:  Procedure Laterality Date   BUBBLE STUDY  01/25/2021   Procedure: BUBBLE STUDY;  Surgeon: Buford Dresser, MD;  Location: [place];  Service: Cardiovascular;;   CATARACT EXTRACTION W/ INTRAOCULAR LENS  IMPLANT, BILATERAL  2009; 2010   CYSTOSCOPY/URETEROSCOPY/HOLMIUM LASER/STENT PLACEMENT Right 08/26/2019   Procedure: CYSTOSCOPY/RETROGRADE/URETEROSCOPY/HOLMIUM LASER/STENT PLACEMENT;  Surgeon: Ceasar Mons, MD;  Location: [facility];  Service: Urology;  Laterality: Right;   CYSTOSCOPY/URETEROSCOPY/HOLMIUM LASER/STENT PLACEMENT Left 11/17/2019   Procedure: CYSTOSCOPY/URETEROSCOPY, RETROGRADE,LASER LITHOTRIPSY, STENT PLACEMENT;  Surgeon: Ceasar Mons, MD;  Location: [facility];  Service: Urology;  Laterality: Left;   EXPLORATORY LAPAROTOMY W/ BOWEL RESECTION  01-15-2004  @[facility]    small bowel resection for stricture/ benzoar   EXTRACORPOREAL SHOCK WAVE LITHOTRIPSY Right 03/27/2018   Procedure: EXTRACORPOREAL SHOCK WAVE LITHOTRIPSY (ESWL);  Surgeon: Ardis Hughs, MD;  Location: [facility];  Service: Urology;  Laterality: Right;   EXTRACORPOREAL SHOCK WAVE LITHOTRIPSY  x2 2008;  2009;  2015   HOLMIUM LASER APPLICATION Left 0/63/0160   Procedure: HOLMIUM LASER APPLICATION;  Surgeon: Ceasar Mons, MD;  Location: [facility];  Service: Urology;  Laterality: Left;   ORIF ANKLE  FRACTURE Right 09/ 2008 @[facility]    per pt has retained hardware   TEE WITHOUT CARDIOVERSION N/A 01/25/2021   Procedure: TRANSESOPHAGEAL ECHOCARDIOGRAM (TEE);  Surgeon:  Buford Dresser, MD;  Location: [facility] END[facility]COPY;  Service: Cardiovascular;  Laterality: N/A;   TONSILLECTOMY  age 75     reports that she has never smoked. She has never used smokeless tobacco. She reports current alcohol use of about 7.0 standard drinks per week. She reports that she does not use drugs.  Allergies  Allergen Reactions   Oxycontin [Oxycodone] Itching   Penicillins Other (See Comments)    unk Did it involve swelling of the face/tongue/throat, SOB, or low BP? Unknown Did it involve sudden or severe rash/hives, skin peeling, or any reaction on the inside of your mouth or nose? Unknown Did you need to seek medical attention at a hospital or doctor's office? Unknown When did it last happen?     over 10 years ago  If all above answers are "NO", may proceed with cephalosporin use.    Tetracyclines & Related Itching    Family History  Problem Relation Age of Onset   Breast cancer Maternal Aunt    Heart disease Father    Heart Problems Father      Prior to Admission medications   Medication Sig Start Date End Date Taking? Authorizing Provider  albuterol (PROVENTIL HFA;VENTOLIN HFA) 108 (90 BASE) MCG/ACT inhaler Inhale 1 puff into the lungs every 6 (six) hours as needed for wheezing or shortness of breath.    [provider]  amLODipine (NORVASC) 5 MG tablet Take 5 mg by mouth daily.     [provider]  atorvastatin (LIPITOR) 40 MG tablet Take 40 mg by mouth daily.     [provider]  baclofen (LIORESAL) 10 MG tablet Take 10 mg by mouth 3 (three) times daily. 01/24/21   [provider]  BREO ELLIPTA 100-25 MCG/INH AEPB INHALE 1 PUFFS INTO THE LUNGS ONCE DAILY Patient taking differently: Inhale 1 puff into the lungs daily. 08/04/20   Mannam, Hart Robinsons, MD  ceFAZolin (ANCEF) IVPB Inject 2 g into the vein every 8 (eight) hours. Indication:  Osteomyelitis First Dose: No Last Day of Therapy:  03/09/2021 Labs - Once weekly:  CBC/D and  BMP, Labs - Every other week:  ESR and CRP Method of administration: IV Push Method of administration may be changed at the discretion of home infusion pharmacist based upon assessment of the patient and/or caregiver's ability to self-administer the medication ordered. 01/27/21 03/10/21  Aline August, MD  celecoxib (CELEBREX) 200 MG capsule Take 200-400 mg by mouth daily as needed for mild pain.    [provider]  cetirizine (ZYRTEC) 10 MG tablet Take 10 mg by mouth daily as needed for allergies.    [provider]  fluticasone (FLONASE) 50 MCG/ACT nasal spray Place 1 spray into both nostrils 2 (two) times daily.     [provider]  gabapentin (NEURONTIN) 300 MG capsule Take 600 mg by mouth at bedtime.    [provider]  lidocaine (LIDODERM) 5 % Place 1 patch onto the skin daily. Remove & Discard patch within 12 hours or as directed by MD 01/27/21   Aline August, MD  losartan (COZAAR) 50 MG tablet Take 50 mg by mouth daily.  04/25/18   [provider]  methocarbamol (ROBAXIN) 500 MG tablet Take 1 tablet (500 mg total) by mouth every 6 (six) hours as needed for muscle spasms. 01/27/21   Starla Link,  Kshitiz, MD  montelukast (SINGULAIR) 10 MG tablet Take 10 mg by mouth daily.     [provider]  ondansetron (ZOFRAN) 4 MG tablet Take 4 mg by mouth every 8 (eight) hours as needed for nausea or vomiting.    [provider]  oxyCODONE (OXYCONTIN) 15 mg 12 hr tablet Take 1 tablet (15 mg total) by mouth every 12 (twelve) hours. 01/27/21   Aline August, MD  oxyCODONE-acetaminophen (PERCOCET/ROXICET) 5-325 MG tablet Take 1 tablet by mouth every 6 (six) hours as needed for severe pain. 01/27/21   Aline August, MD  polyethylene glycol (MIRALAX / GLYCOLAX) 17 g packet Take 17 g by mouth daily as needed. 01/27/21   Aline August, MD  senna-docusate (SENOKOT-S) 8.6-50 MG tablet Take 1 tablet by mouth 2 (two) times daily. 01/27/21   Aline August, MD   SUMAtriptan (IMITREX) 50 MG tablet Take 50 mg by mouth daily as needed for migraine. May repeat 1 dose in an hour. 12/07/20   [provider]  traZODone (DESYREL) 150 MG tablet Take 150 mg by mouth at bedtime.     [provider]  venlafaxine XR (EFFEXOR-XR) 150 MG 24 hr capsule Take 150 mg by mouth every morning.     [provider]    Physical Exam: Vitals:   02/18/21 1500 02/18/21 1606 02/18/21 1607 02/18/21 1712  BP: (!) 170/80 (!) 171/79    Pulse: 87 90    Resp: 19 20    Temp:   99.3 F (37.4 C)   TempSrc:   Oral   SpO2: 100% 99%    Weight:    90.7 kg  Height:    5' 6"  (1.676 m)    Constitutional: Weak appearing, pleasant female Vitals:   02/18/21 1500 02/18/21 1606 02/18/21 1607 02/18/21 1712  BP: (!) 170/80 (!) 171/79    Pulse: 87 90    Resp: 19 20    Temp:   99.3 F (37.4 C)   TempSrc:   Oral   SpO2: 100% 99%    Weight:    90.7 kg  Height:    5' 6"  (1.676 m)   Eyes: PERRL, lids and conjunctivae normal ENMT: Mucous membranes are moist.  Neck: normal, supple, no masses, no thyromegaly Respiratory: clear to auscultation bilaterally, no wheezing, no crackles. Normal respiratory effort. No accessory muscle use.  Cardiovascular: Regular rate and rhythm, no murmurs / rubs / gallops. No extremity edema.  Abdomen: no tenderness, no masses palpated. No hepatosplenomegaly. Bowel sounds positive.  Musculoskeletal: no clubbing / cyanosis. No joint deformity upper and lower extremities.  PICC line on the right Skin: no rashes, lesions, ulcers. No induration Neurologic: CN 2-12 grossly intact.  Strength 5/5 in all 4.  Psychiatric: Normal judgment and insight. Alert and oriented x 3. Normal mood.   Foley Catheter:None  Labs on Admission: I have personally reviewed following labs and imaging studies  CBC: Recent Labs  Lab 02/18/21 1428  WBC 6.8  NEUTROABS 5.9  HGB 9.2*  HCT 29.3*  MCV 89.1  PLT 474   Basic Metabolic Panel: Recent Labs   Lab 02/18/21 1428  NA 137  K 2.5*  CL 103  CO2 23  GLUCOSE 165*  BUN 8  CREATININE 0.53  CALCIUM 8.1*   GFR: Estimated Creatinine Clearance: 72.1 mL/min (by C-G formula based on SCr of 0.53 mg/dL). Liver Function Tests: Recent Labs  Lab 02/18/21 1428  AST 13*  ALT 5  ALKPHOS 100  BILITOT 0.8  PROT 7.6  ALBUMIN 2.9*   Recent Labs  Lab 02/18/21 1428  LIPASE 27   No results for input(s): AMMONIA in the last 168 hours. Coagulation Profile: No results for input(s): INR, PROTIME in the last 168 hours. Cardiac Enzymes: No results for input(s): CKTOTAL, CKMB, CKMBINDEX, TROPONINI in the last 168 hours. BNP (last 3 results) No results for input(s): PROBNP in the last 8760 hours. HbA1C: No results for input(s): HGBA1C in the last 72 hours. CBG: No results for input(s): GLUCAP in the last 168 hours. Lipid Profile: No results for input(s): CHOL, HDL, LDLCALC, TRIG, CHOLHDL, LDLDIRECT in the last 72 hours. Thyroid Function Tests: No results for input(s): TSH, T4TOTAL, FREET4, T3FREE, THYROIDAB in the last 72 hours. Anemia Panel: No results for input(s): VITAMINB12, FOLATE, FERRITIN, TIBC, IRON, RETICCTPCT in the last 72 hours. Urine analysis:    Component Value Date/Time   COLORURINE STRAW (A) 02/18/2021 1637   APPEARANCEUR CLEAR 02/18/2021 1637   LABSPEC 1.009 02/18/2021 1637   PHURINE 8.0 02/18/2021 1637   GLUCOSEU NEGATIVE 02/18/2021 1637   HGBUR MODERATE (A) 02/18/2021 1637   BILIRUBINUR NEGATIVE 02/18/2021 1637   KETONESUR 20 (A) 02/18/2021 1637   PROTEINUR NEGATIVE 02/18/2021 1637   NITRITE NEGATIVE 02/18/2021 1637   LEUKOCYTESUR NEGATIVE 02/18/2021 1637    Radiological Exams on Admission: DG ABD ACUTE 2+V W 1V CHEST  Result Date: 02/18/2021 CLINICAL DATA:  Nausea and vomiting with abdominal pain. EXAM: DG ABDOMEN ACUTE WITH 1 VIEW CHEST COMPARISON:  CT abdomen and pelvis 01/23/2021. chest x-ray 12/16/2020. FINDINGS: Right upper extremity PICC terminates  over the mid SVC. There is some linear areas of scarring or atelectasis in the mid and lower lungs. There is no focal lung consolidation, pleural effusion or pneumothorax identified. Bowel gas pattern is nonobstructive. Bowel anastomosis is seen in the right abdomen. Overall, there is a paucity of bowel gas. Stomach is nondilated. There is no free air under the diaphragm. Bilateral renal calculi are again noted. There is a calcified uterine fibroid in the pelvis, unchanged. Degenerative changes affect the spine and hips. IMPRESSION: 1. Nonobstructive, nonspecific bowel gas pattern. Overall, there is a paucity of bowel gas which can be seen with fluid-filled bowel. Please correlate clinically. 2. Bilateral renal calculi. 3. No acute cardiopulmonary process. Electronically Signed   By: Ronney Asters M.D.   On: 02/18/2021 16:27     Assessment/Plan Principal Problem:   Intractable nausea and vomiting Active Problems:   Mild persistent asthma without complication   Discitis of lumbar region   Hypokalemia  Intractable nausea/vomiting/loose stools: Having nausea since yesterday, had multiple episodes of vomiting since this morning along with loose stools.  Unable to tolerate any food or drink by mouth. Started on IV fluids, clear on started on clear liquid diet. No report of abdominal pain, fever or chills. Needs to rule out C. difficile as she is on cefazolin.  Follow-up C. difficile, GI pathogen panel.  Patient also complains of mild generalized abdominal discomfort, no tenderness noted.  Abdominal x-ray did not show any acute findings.  Will check CT abdomen/pelvis with IV/oral contrast Continue antiemetics, supportive care  Severe hypokalemia: Potassium of 2.5.  Will check magnesium.  Continue aggressive supplementation  Recent history of MSSA bacteremia/L4-L5 vertebral osteomyelitis/discitis: Hospitalized for this on August this year.  TEE was negative for vegetation.  She is supposed to be on  cefazolin 2 g 3 times a day till 10/5 .  She follows with ID as an outpatient.  We will continue cefazolin here  Bilateral renal calculi: As seen on abd xray,chronic findings.We recommend to follow-up with urology as an outpatient.  Mild persistent asthma : Continue bronchodilators as needed.  On Singulair.  Currently not in exacerbation  Severe hypertension: Most likely is due to inability to tolerate oral medication.  Continue current medications for severe hypertension.  On amlodipine and losartan at home which we will continued  History of neuropathy: On gabapentin  History of hyperlipidemia: On statin  History of depression: On Effexor, trazodone  Debility/deconditioning: Recently discharged from skilled nursing facility.  Looks significantly weak, lives alone.  Will reevaluate with PT/OT     Severity of Illness: The appropriate patient status for this patient is OBSERVATION.  DVT prophylaxis: Lovenox Code Status:Full  Family Communication: None at bedside Consults called: None     Shelly Coss MD [facility]  02/18/2021, 5:15 PM

## 2021-02-18 NOTE — Progress Notes (Signed)
Pharmacy Antibiotic Note  Haley Singh is a 70 y.o. female admitted on 02/18/2021 with nausea, vomiting.  Pharmacy has been consulted to continue Cefazolin dosing for hx MSSA bacteremia, vertebral osteomyelitis/discitis.  Plan: Continue Cefazolin 2g IV q8h No dose adjustments needed  Height: 5\' 6"  (167.6 cm) Weight: 90.7 kg (199 lb 15.3 oz) IBW/kg (Calculated) : 59.3  Temp (24hrs), Avg:98.9 F (37.2 C), Min:98.4 F (36.9 C), Max:99.3 F (37.4 C)  Recent Labs  Lab 02/18/21 1428  WBC 6.8  CREATININE 0.53    Estimated Creatinine Clearance: 72.1 mL/min (by C-G formula based on SCr of 0.53 mg/dL).    Allergies  Allergen Reactions   Oxycontin [Oxycodone] Itching   Penicillins Other (See Comments)    unk Did it involve swelling of the face/tongue/throat, SOB, or low BP? Unknown Did it involve sudden or severe rash/hives, skin peeling, or any reaction on the inside of your mouth or nose? Unknown Did you need to seek medical attention at a hospital or doctor's office? Unknown When did it last happen?     over 10 years ago  If all above answers are "NO", may proceed with cephalosporin use.    Tetracyclines & Related Itching    Antimicrobials this admission:  8/16 Vanc >> 8/18 8/19 Ancef >>  Microbiology results:  Previous: 8/17 Vertebra abscess: NG 8/17 BCx: MSSA 8/19 BCx: MSSA 8/22 BCx: NGF  9/17 C.diff 9/17 GI panel  Thank you for allowing pharmacy to be a part of this patient's care.  10/17, PharmD, BCPS Pharmacy: [phone] 02/18/2021 5:26 PM

## 2021-02-18 NOTE — ED Triage Notes (Addendum)
Per EMS, patient from home, d/c from [place] [facility] after osteomyelitis on 9/15. C/o N/V and weakness. States PICC abx dilution was changed from to 67ml when weakness started. N/V started last night.  18g L AC 4mg  Zofran

## 2021-02-19 ENCOUNTER — Observation Stay (HOSPITAL_COMMUNITY): Payer: Medicare PPO | Admitting: Certified Registered Nurse Anesthetist

## 2021-02-19 ENCOUNTER — Encounter (HOSPITAL_COMMUNITY)
Admission: EM | Disposition: A | Discharge status: Skilled Nursing Facility | Payer: Self-pay | Source: Home / Self Care | Attending: Internal Medicine

## 2021-02-19 ENCOUNTER — Encounter (HOSPITAL_COMMUNITY): Payer: Self-pay | Admitting: Internal Medicine

## 2021-02-19 ENCOUNTER — Observation Stay (HOSPITAL_COMMUNITY): Payer: Medicare PPO

## 2021-02-19 DIAGNOSIS — R112 Nausea with vomiting, unspecified: Secondary | ICD-10-CM | POA: Diagnosis not present

## 2021-02-19 HISTORY — PX: CYSTOSCOPY/URETEROSCOPY/HOLMIUM LASER/STENT PLACEMENT: SHX6546

## 2021-02-19 LAB — COMPREHENSIVE METABOLIC PANEL
ALT: 5 U/L (ref 0–44)
AST: 14 U/L — ABNORMAL LOW (ref 15–41)
Albumin: 3.4 g/dL — ABNORMAL LOW (ref 3.5–5.0)
Alkaline Phosphatase: 108 U/L (ref 38–126)
Anion gap: 13 (ref 5–15)
BUN: <5 mg/dL — ABNORMAL LOW (ref 8–23)
CO2: 25 mmol/L (ref 22–32)
Calcium: 8.5 mg/dL — ABNORMAL LOW (ref 8.9–10.3)
Chloride: 101 mmol/L (ref 98–111)
Creatinine, Ser: 0.38 mg/dL — ABNORMAL LOW (ref 0.44–1.00)
GFR, Estimated: >60 mL/min (ref >60)
Glucose, Bld: 151 mg/dL — ABNORMAL HIGH (ref 70–99)
Potassium: 2.8 mmol/L — ABNORMAL LOW (ref 3.5–5.1)
Sodium: 139 mmol/L (ref 135–145)
Total Bilirubin: 0.8 mg/dL (ref 0.3–1.2)
Total Protein: 8.8 g/dL — ABNORMAL HIGH (ref 6.5–8.1)

## 2021-02-19 LAB — CBC
HCT: 35.1 % — ABNORMAL LOW (ref 36.0–46.0)
Hemoglobin: 11.3 g/dL — ABNORMAL LOW (ref 12.0–15.0)
MCH: 28 pg (ref 26.0–34.0)
MCHC: 32.2 g/dL (ref 30.0–36.0)
MCV: 87.1 fL (ref 80.0–100.0)
Platelets: 271 10*3/uL (ref 150–400)
RBC: 4.03 MIL/uL (ref 3.87–5.11)
RDW: 15.9 % — ABNORMAL HIGH (ref 11.5–15.5)
WBC: 8.2 10*3/uL (ref 4.0–10.5)
nRBC: 0 % (ref 0.0–0.2)

## 2021-02-19 LAB — POTASSIUM: Potassium: 3.9 mmol/L (ref 3.5–5.1)

## 2021-02-19 SURGERY — CYSTOSCOPY/URETEROSCOPY/HOLMIUM LASER/STENT PLACEMENT
Anesthesia: General | Site: Ureter | Laterality: Left

## 2021-02-19 MED ORDER — DEXAMETHASONE SODIUM PHOSPHATE 4 MG/ML IJ SOLN
INTRAMUSCULAR | Status: DC | PRN
  Administered 2021-02-19: 8 mg via INTRAVENOUS

## 2021-02-19 MED ORDER — FENTANYL CITRATE (PF) 100 MCG/2ML IJ SOLN
INTRAMUSCULAR | Status: DC | PRN
  Administered 2021-02-19 (×2): 25 ug via INTRAVENOUS
  Administered 2021-02-19: 50 ug via INTRAVENOUS

## 2021-02-19 MED ORDER — LACTATED RINGERS IV SOLN: INTRAVENOUS | Status: DC | PRN

## 2021-02-19 MED ORDER — SUCCINYLCHOLINE CHLORIDE 200 MG/10ML IV SOSY
PREFILLED_SYRINGE | INTRAVENOUS | Status: AC
  Filled 2021-02-19: qty 10

## 2021-02-19 MED ORDER — ATROPINE SULFATE 1 MG/10ML IJ SOSY
PREFILLED_SYRINGE | INTRAMUSCULAR | Status: AC
  Filled 2021-02-19: qty 10

## 2021-02-19 MED ORDER — PHENYLEPHRINE 40 MCG/ML (10ML) SYRINGE FOR IV PUSH (FOR BLOOD PRESSURE SUPPORT)
PREFILLED_SYRINGE | INTRAVENOUS | Status: AC
  Filled 2021-02-19: qty 10

## 2021-02-19 MED ORDER — ONDANSETRON HCL 4 MG/2ML IJ SOLN
INTRAMUSCULAR | Status: AC
  Filled 2021-02-19: qty 2

## 2021-02-19 MED ORDER — KETOROLAC TROMETHAMINE 30 MG/ML IJ SOLN: 30.0000 mg | Freq: Once | INTRAMUSCULAR | Status: DC | PRN

## 2021-02-19 MED ORDER — POTASSIUM CHLORIDE CRYS ER 20 MEQ PO TBCR
60.0000 meq | EXTENDED_RELEASE_TABLET | Freq: Once | ORAL | Status: AC
  Administered 2021-02-19: 60 meq via ORAL
  Filled 2021-02-19: qty 3

## 2021-02-19 MED ORDER — STERILE WATER FOR IRRIGATION IR SOLN
Status: DC | PRN
  Administered 2021-02-19: 1000 mL

## 2021-02-19 MED ORDER — SODIUM CHLORIDE 0.9 % IV SOLN
12.5000 mg | Freq: Once | INTRAVENOUS | Status: AC
  Administered 2021-02-19: 12.5 mg via INTRAVENOUS
  Filled 2021-02-19: qty 12.5

## 2021-02-19 MED ORDER — PROMETHAZINE HCL 25 MG/ML IJ SOLN: 6.2500 mg | INTRAMUSCULAR | Status: DC | PRN

## 2021-02-19 MED ORDER — PROPOFOL 10 MG/ML IV BOLUS
INTRAVENOUS | Status: DC | PRN
  Administered 2021-02-19: 100 mg via INTRAVENOUS
  Administered 2021-02-19: 40 mg via INTRAVENOUS
  Administered 2021-02-19 (×2): 30 mg via INTRAVENOUS

## 2021-02-19 MED ORDER — ACETAMINOPHEN 10 MG/ML IV SOLN
INTRAVENOUS | Status: AC
  Filled 2021-02-19: qty 100

## 2021-02-19 MED ORDER — ACETAMINOPHEN 10 MG/ML IV SOLN
INTRAVENOUS | Status: DC | PRN
  Administered 2021-02-19: 1000 mg via INTRAVENOUS

## 2021-02-19 MED ORDER — PROPOFOL 10 MG/ML IV BOLUS
INTRAVENOUS | Status: AC
  Filled 2021-02-19: qty 20

## 2021-02-19 MED ORDER — EPHEDRINE 5 MG/ML INJ
INTRAVENOUS | Status: AC
  Filled 2021-02-19: qty 5

## 2021-02-19 MED ORDER — FENTANYL CITRATE (PF) 100 MCG/2ML IJ SOLN
INTRAMUSCULAR | Status: AC
  Filled 2021-02-19: qty 2

## 2021-02-19 MED ORDER — LIDOCAINE 2% (20 MG/ML) 5 ML SYRINGE
INTRAMUSCULAR | Status: DC | PRN
  Administered 2021-02-19: 60 mg via INTRAVENOUS

## 2021-02-19 MED ORDER — CHLORHEXIDINE GLUCONATE CLOTH 2 % EX PADS
6.0000 | MEDICATED_PAD | Freq: Every day | CUTANEOUS | Status: DC
  Administered 2021-02-20 – 2021-02-26 (×7): 6 via TOPICAL

## 2021-02-19 MED ORDER — IOHEXOL 300 MG/ML  SOLN
INTRAMUSCULAR | Status: DC | PRN
  Administered 2021-02-19: 6 mL via URETHRAL

## 2021-02-19 MED ORDER — ALTEPLASE 2 MG IJ SOLR
2.0000 mg | Freq: Once | INTRAMUSCULAR | Status: DC
  Filled 2021-02-19: qty 2

## 2021-02-19 MED ORDER — HYDROMORPHONE HCL 1 MG/ML IJ SOLN: 0.2500 mg | INTRAMUSCULAR | Status: DC | PRN

## 2021-02-19 MED ORDER — AMISULPRIDE (ANTIEMETIC) 5 MG/2ML IV SOLN: 10.0000 mg | Freq: Once | INTRAVENOUS | Status: DC | PRN

## 2021-02-19 MED ORDER — SODIUM CHLORIDE 0.9% FLUSH
10.0000 mL | Freq: Two times a day (BID) | INTRAVENOUS | Status: DC
  Administered 2021-02-21: 10 mL

## 2021-02-19 MED ORDER — EPHEDRINE SULFATE 50 MG/ML IJ SOLN
INTRAMUSCULAR | Status: DC | PRN
  Administered 2021-02-19: 5 mg via INTRAVENOUS

## 2021-02-19 MED ORDER — SODIUM CHLORIDE 0.9% FLUSH
10.0000 mL | INTRAVENOUS | Status: DC | PRN
  Administered 2021-02-22: 10 mL

## 2021-02-19 MED ORDER — SUCCINYLCHOLINE CHLORIDE 200 MG/10ML IV SOSY
PREFILLED_SYRINGE | INTRAVENOUS | Status: DC | PRN
  Administered 2021-02-19: 120 mg via INTRAVENOUS

## 2021-02-19 MED ORDER — SODIUM CHLORIDE 0.9 % IR SOLN
Status: DC | PRN
  Administered 2021-02-19: 6000 mL

## 2021-02-19 MED ORDER — POTASSIUM CHLORIDE 10 MEQ/100ML IV SOLN
10.0000 meq | INTRAVENOUS | Status: AC
  Administered 2021-02-19 (×5): 10 meq via INTRAVENOUS
  Filled 2021-02-19 (×5): qty 100

## 2021-02-19 MED ORDER — LIDOCAINE HCL (PF) 2 % IJ SOLN
INTRAMUSCULAR | Status: AC
  Filled 2021-02-19: qty 5

## 2021-02-19 MED ORDER — DEXAMETHASONE SODIUM PHOSPHATE 10 MG/ML IJ SOLN
INTRAMUSCULAR | Status: AC
  Filled 2021-02-19: qty 1

## 2021-02-19 MED ORDER — PHENYLEPHRINE 40 MCG/ML (10ML) SYRINGE FOR IV PUSH (FOR BLOOD PRESSURE SUPPORT)
PREFILLED_SYRINGE | INTRAVENOUS | Status: DC | PRN
  Administered 2021-02-19: 80 ug via INTRAVENOUS

## 2021-02-19 MED ORDER — ONDANSETRON HCL 4 MG/2ML IJ SOLN
INTRAMUSCULAR | Status: DC | PRN
  Administered 2021-02-19: 4 mg via INTRAVENOUS

## 2021-02-19 MED ORDER — HYDROCODONE-ACETAMINOPHEN 7.5-325 MG PO TABS: 1.0000 | ORAL_TABLET | Freq: Once | ORAL | Status: DC | PRN

## 2021-02-19 SURGICAL SUPPLY — 19 items
BAG URO CATCHER STRL LF (MISCELLANEOUS) ×2 IMPLANT
BASKET LASER NITINOL 1.9FR (BASKET) IMPLANT
BASKET ZERO TIP NITINOL 2.4FR (BASKET) ×1 IMPLANT
BSKT STON RTRVL 120 1.9FR (BASKET)
BSKT STON RTRVL ZERO TP 2.4FR (BASKET) ×1
CATH INTERMIT  6FR 70CM (CATHETERS) ×2 IMPLANT
CLOTH BEACON ORANGE TIMEOUT ST (SAFETY) ×2 IMPLANT
COVER DOME SNAP 22 D (MISCELLANEOUS) ×1 IMPLANT
GLOVE SURG ENC MOIS LTX SZ6.5 (GLOVE) ×1 IMPLANT
GLOVE SURG ENC MOIS LTX SZ7.5 (GLOVE) ×3 IMPLANT
GLOVE SURG LTX SZ6.5 (GLOVE) ×1 IMPLANT
GOWN STRL REUS W/TWL XL LVL3 (GOWN DISPOSABLE) ×2 IMPLANT
GUIDEWIRE STR DUAL SENSOR (WIRE) ×3 IMPLANT
KIT TURNOVER KIT A (KITS) ×2 IMPLANT
MANIFOLD NEPTUNE II (INSTRUMENTS) ×2 IMPLANT
PACK CYSTO (CUSTOM PROCEDURE TRAY) ×2 IMPLANT
STENT URET 6FRX24 CONTOUR (STENTS) ×1 IMPLANT
TUBING CONNECTING 10 (TUBING) ×2 IMPLANT
TUBING UROLOGY SET (TUBING) ×2 IMPLANT

## 2021-02-19 NOTE — Plan of Care (Signed)
  Problem: Pain Managment: Goal: General experience of comfort will improve Outcome: Progressing   Problem: Safety: Goal: Ability to remain free from injury will improve Outcome: Progressing   Problem: Nutrition: Goal: Adequate nutrition will be maintained Outcome: Progressing   

## 2021-02-19 NOTE — Consult Note (Signed)
H&P Physician requesting consult: Amrit Adhikari  Chief Complaint: Continuous nausea, intermittent left flank pain  History of Present Illness: 70 year old female with history of nephrolithiasis.  She came in last night with persistent nausea and vomiting.  Unable to be controlled.  She also has some intermittent left-sided abdominal pain.  CT of the abdomen pelvis revealed a mid left ureteral calculus about 5 mm in size.  She had bilateral renal calculi.  She also had progressive osteomyelitis.  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   Hyperlipidemia    Hypertension    followed by pcp  (11-10-2019 pt stated never had a stress test)   Insomnia    MDD (major depressive disorder)    Mild persistent asthma    followed by pcp and dr Bernita Raisin (pulmonologist)   RLS (restless legs syndrome)    Past Surgical History:  Procedure Laterality Date   BUBBLE STUDY  01/25/2021   Procedure: BUBBLE STUDY;  Surgeon: Buford Dresser, MD;  Location: [place];  Service: Cardiovascular;;   CATARACT EXTRACTION W/ INTRAOCULAR LENS  IMPLANT, BILATERAL  2009; 2010   CYSTOSCOPY/URETEROSCOPY/HOLMIUM LASER/STENT PLACEMENT Right 08/26/2019   Procedure: CYSTOSCOPY/RETROGRADE/URETEROSCOPY/HOLMIUM LASER/STENT PLACEMENT;  Surgeon: Ceasar Mons, MD;  Location: [facility];  Service: Urology;  Laterality: Right;   CYSTOSCOPY/URETEROSCOPY/HOLMIUM LASER/STENT PLACEMENT Left 11/17/2019   Procedure: CYSTOSCOPY/URETEROSCOPY, RETROGRADE,LASER LITHOTRIPSY, STENT PLACEMENT;  Surgeon: Ceasar Mons, MD;  Location: [facility];  Service: Urology;  Laterality: Left;   EXPLORATORY LAPAROTOMY W/ BOWEL RESECTION  01-15-2004  @[facility]    small bowel  resection for stricture/ benzoar   EXTRACORPOREAL SHOCK WAVE LITHOTRIPSY Right 03/27/2018   Procedure: EXTRACORPOREAL SHOCK WAVE LITHOTRIPSY (ESWL);  Surgeon: Ardis Hughs, MD;  Location: [facility];  Service: Urology;  Laterality: Right;   EXTRACORPOREAL SHOCK WAVE LITHOTRIPSY  x2 2008;  2009;  2015   HOLMIUM LASER APPLICATION Left 7/56/4332   Procedure: HOLMIUM LASER APPLICATION;  Surgeon: Ceasar Mons, MD;  [facility];  Service: Urology;  Laterality: Left;   ORIF ANKLE FRACTURE Right 09/ 2008 @[facility]    per pt has retained hardware   TEE WITHOUT CARDIOVERSION N/A 01/25/2021   Procedure: TRANSESOPHAGEAL ECHOCARDIOGRAM (TEE);  Surgeon: Buford Dresser, MD;  Location: [facility];  Service: Cardiovascular;  Laterality: N/A;   TONSILLECTOMY  age 37    Home Medications:  Medications Prior to Admission  Medication Sig Dispense Refill Last Dose   atorvastatin (LIPITOR) 40 MG tablet Take 40 mg by mouth daily.    02/18/2021 at am   baclofen (LIORESAL) 10 MG tablet Take 10 mg by mouth 3 (three) times daily.   02/18/2021 at am   BREO ELLIPTA 100-25 MCG/INH AEPB INHALE 1 PUFFS INTO THE LUNGS ONCE DAILY (Patient taking differently: Inhale 1 puff into the lungs every morning.) 60 each 5 02/17/2021 at am   ceFAZolin (ANCEF) IVPB Inject 2 g into the vein every 8 (eight) hours. Indication:  Osteomyelitis First Dose: No Last Day of Therapy:  03/09/2021 Labs - Once weekly:  CBC/D and BMP, Labs - Every other week:  ESR and CRP Method of administration: IV Push Method of administration may be changed at the discretion of home infusion pharmacist based upon assessment  of the patient and/or caregiver's ability to self-administer the medication ordered. 252 Units 0 02/18/2021 at 0800   celecoxib (CELEBREX) 200 MG capsule Take 400 mg by mouth every morning.   02/18/2021 at am   cetirizine (ZYRTEC) 10 MG tablet Take 10 mg by mouth every morning.   02/18/2021 at am    fluticasone (FLONASE) 50 MCG/ACT nasal spray Place 1 spray into both nostrils 2 (two) times daily.    Past Week   gabapentin (NEURONTIN) 300 MG capsule Take 600 mg by mouth at bedtime.   02/17/2021 at pm   losartan (COZAAR) 50 MG tablet Take 50 mg by mouth daily.    02/18/2021 at am   methocarbamol (ROBAXIN) 500 MG tablet Take 1 tablet (500 mg total) by mouth every 6 (six) hours as needed for muscle spasms. 20 tablet 0 02/18/2021 at early am   montelukast (SINGULAIR) 10 MG tablet Take 10 mg by mouth daily.    02/18/2021 at am   oxyCODONE-acetaminophen (PERCOCET/ROXICET) 5-325 MG tablet Take 1 tablet by mouth every 6 (six) hours as needed for severe pain. 10 tablet 0 02/18/2021 at am   promethazine (PHENERGAN) 12.5 MG tablet Take 12.5 mg by mouth every 6 (six) hours as needed for nausea or vomiting.   02/18/2021 at am   SUMAtriptan (IMITREX) 50 MG tablet Take 50 mg by mouth See admin instructions. Take one tablet (50 mg) by mouth at onset of migraine headache, may repeat 1 dose in an hour.   several months ago   traZODone (DESYREL) 150 MG tablet Take 150 mg by mouth at bedtime.    02/17/2021 at pm   venlafaxine XR (EFFEXOR-XR) 150 MG 24 hr capsule Take 150 mg by mouth every morning.    02/18/2021 at am   amLODipine (NORVASC) 5 MG tablet Take 5 mg by mouth daily.       lidocaine (LIDODERM) 5 % Place 1 patch onto the skin daily. Remove & Discard patch within 12 hours or as directed by MD (Patient not taking: No sig reported) 15 patch 0 Not Taking   oxyCODONE (OXYCONTIN) 15 mg 12 hr tablet Take 1 tablet (15 mg total) by mouth every 12 (twelve) hours. (Patient not taking: No sig reported) 10 tablet 0 Not Taking   polyethylene glycol (MIRALAX / GLYCOLAX) 17 g packet Take 17 g by mouth daily as needed. (Patient not taking: No sig reported) 14 each 0 Not Taking   senna-docusate (SENOKOT-S) 8.6-50 MG tablet Take 1 tablet by mouth 2 (two) times daily. (Patient not taking: No sig reported) 20 tablet 0 Not Taking    Allergies:  Allergies  Allergen Reactions   Oxycontin [Oxycodone] Itching   Penicillins Other (See Comments)    Unknown reaction Did it involve swelling of the face/tongue/throat, SOB, or low BP? Unknown Did it involve sudden or severe rash/hives, skin peeling, or any reaction on the inside of your mouth or nose? Unknown Did you need to seek medical attention at a hospital or doctor's office? Unknown When did it last happen?     over 10 years ago  If all above answers are "NO", may proceed with cephalosporin use.    Tetracyclines & Related Itching    Family History  Problem Relation Age of Onset   Breast cancer Maternal Aunt    Heart disease Father    Heart Problems Father    Social History:  reports that she has never smoked. She has never used smokeless tobacco. She reports current alcohol use  of about 7.0 standard drinks per week. She reports that she does not use drugs.  ROS: A complete review of systems was performed.  All systems are negative except for pertinent findings as noted. ROS   Physical Exam:  Vital signs in last 24 hours: Temp:  [98.4 F (36.9 C)-99.3 F (37.4 C)] 99.3 F (37.4 C) (09/18 0631) Pulse Rate:  [79-95] 83 (09/18 0631) Resp:  [15-20] 15 (09/18 0631) BP: (160-177)/(79-133) 163/80 (09/18 0631) SpO2:  [97 %-100 %] 97 % (09/18 0631) Weight:  [90.7 kg] 90.7 kg (09/17 1712) General:  Alert and oriented, No acute distress HEENT: Normocephalic, atraumatic Neck: No JVD or lymphadenopathy Cardiovascular: Regular rate and rhythm Lungs: Regular rate and effort Abdomen: Soft, nontender, nondistended, no abdominal masses Back: No CVA tenderness Extremities: No edema Neurologic: Grossly intact  Laboratory Data:  Results for orders placed or performed during the hospital encounter of 02/18/21 (from the past 24 hour(s))  CBC with Differential/Platelet     Status: Abnormal   Collection Time: 02/18/21  2:28 PM  Result Value Ref Range   WBC 6.8 4.0 -  10.5 K/uL   RBC 3.29 (L) 3.87 - 5.11 MIL/uL   Hemoglobin 9.2 (L) 12.0 - 15.0 g/dL   HCT 29.3 (L) 36.0 - 46.0 %   MCV 89.1 80.0 - 100.0 fL   MCH 28.0 26.0 - 34.0 pg   MCHC 31.4 30.0 - 36.0 g/dL   RDW 15.6 (H) 11.5 - 15.5 %   Platelets 225 150 - 400 K/uL   nRBC 0.0 0.0 - 0.2 %   Neutrophils Relative % 86 %   Neutro Abs 5.9 1.7 - 7.7 K/uL   Lymphocytes Relative 6 %   Lymphs Abs 0.4 (L) 0.7 - 4.0 K/uL   Monocytes Relative 7 %   Monocytes Absolute 0.5 0.1 - 1.0 K/uL   Eosinophils Relative 0 %   Eosinophils Absolute 0.0 0.0 - 0.5 K/uL   Basophils Relative 0 %   Basophils Absolute 0.0 0.0 - 0.1 K/uL   Immature Granulocytes 1 %   Abs Immature Granulocytes 0.05 0.00 - 0.07 K/uL  Comprehensive metabolic panel     Status: Abnormal   Collection Time: 02/18/21  2:28 PM  Result Value Ref Range   Sodium 137 135 - 145 mmol/L   Potassium 2.5 (LL) 3.5 - 5.1 mmol/L   Chloride 103 98 - 111 mmol/L   CO2 23 22 - 32 mmol/L   Glucose, Bld 165 (H) 70 - 99 mg/dL   BUN 8 8 - 23 mg/dL   Creatinine, Ser 0.53 0.44 - 1.00 mg/dL   Calcium 8.1 (L) 8.9 - 10.3 mg/dL   Total Protein 7.6 6.5 - 8.1 g/dL   Albumin 2.9 (L) 3.5 - 5.0 g/dL   AST 13 (L) 15 - 41 U/L   ALT 5 0 - 44 U/L   Alkaline Phosphatase 100 38 - 126 U/L   Total Bilirubin 0.8 0.3 - 1.2 mg/dL   GFR, Estimated >60 >60 mL/min   Anion gap 11 5 - 15  Lipase, blood     Status: None   Collection Time: 02/18/21  2:28 PM  Result Value Ref Range   Lipase 27 11 - 51 U/L  Magnesium     Status: Abnormal   Collection Time: 02/18/21  2:28 PM  Result Value Ref Range   Magnesium 1.6 (L) 1.7 - 2.4 mg/dL  Urinalysis, Routine w reflex microscopic     Status: Abnormal   Collection Time: 02/18/21  4:37 PM  Result Value Ref Range   Color, Urine STRAW (A) YELLOW   APPearance CLEAR CLEAR   Specific Gravity, Urine 1.009 1.005 - 1.030   pH 8.0 5.0 - 8.0   Glucose, UA NEGATIVE NEGATIVE mg/dL   Hgb urine dipstick MODERATE (A) NEGATIVE   Bilirubin Urine NEGATIVE  NEGATIVE   Ketones, ur 20 (A) NEGATIVE mg/dL   Protein, ur NEGATIVE NEGATIVE mg/dL   Nitrite NEGATIVE NEGATIVE   Leukocytes,Ua NEGATIVE NEGATIVE   RBC / HPF 21-50 0 - 5 RBC/hpf   WBC, UA 0-5 0 - 5 WBC/hpf   Bacteria, UA NONE SEEN NONE SEEN   Squamous Epithelial / LPF 0-5 0 - 5   Hyaline Casts, UA PRESENT   Resp Panel by RT-PCR (Flu A&B, Covid) Nasopharyngeal Swab     Status: None   Collection Time: 02/18/21  6:15 PM   Specimen: Nasopharyngeal Swab; Nasopharyngeal(NP) swabs in vial transport medium  Result Value Ref Range   SARS Coronavirus 2 by RT PCR NEGATIVE NEGATIVE   Influenza A by PCR NEGATIVE NEGATIVE   Influenza B by PCR NEGATIVE NEGATIVE  Comprehensive metabolic panel     Status: Abnormal   Collection Time: 02/19/21  3:18 AM  Result Value Ref Range   Sodium 139 135 - 145 mmol/L   Potassium 2.8 (L) 3.5 - 5.1 mmol/L   Chloride 101 98 - 111 mmol/L   CO2 25 22 - 32 mmol/L   Glucose, Bld 151 (H) 70 - 99 mg/dL   BUN <5 (L) 8 - 23 mg/dL   Creatinine, Ser 0.38 (L) 0.44 - 1.00 mg/dL   Calcium 8.5 (L) 8.9 - 10.3 mg/dL   Total Protein 8.8 (H) 6.5 - 8.1 g/dL   Albumin 3.4 (L) 3.5 - 5.0 g/dL   AST 14 (L) 15 - 41 U/L   ALT 5 0 - 44 U/L   Alkaline Phosphatase 108 38 - 126 U/L   Total Bilirubin 0.8 0.3 - 1.2 mg/dL   GFR, Estimated >60 >60 mL/min   Anion gap 13 5 - 15  CBC     Status: Abnormal   Collection Time: 02/19/21  3:18 AM  Result Value Ref Range   WBC 8.2 4.0 - 10.5 K/uL   RBC 4.03 3.87 - 5.11 MIL/uL   Hemoglobin 11.3 (L) 12.0 - 15.0 g/dL   HCT 35.1 (L) 36.0 - 46.0 %   MCV 87.1 80.0 - 100.0 fL   MCH 28.0 26.0 - 34.0 pg   MCHC 32.2 30.0 - 36.0 g/dL   RDW 15.9 (H) 11.5 - 15.5 %   Platelets 271 150 - 400 K/uL   nRBC 0.0 0.0 - 0.2 %   Recent Results (from the past 240 hour(s))  Resp Panel by RT-PCR (Flu A&B, Covid) Nasopharyngeal Swab     Status: None   Collection Time: 02/18/21  6:15 PM   Specimen: Nasopharyngeal Swab; Nasopharyngeal(NP) swabs in vial transport medium   Result Value Ref Range Status   SARS Coronavirus 2 by RT PCR NEGATIVE NEGATIVE Final    Comment: (NOTE) SARS-CoV-2 target nucleic acids are NOT DETECTED.  The SARS-CoV-2 RNA is generally detectable in upper respiratory specimens during the acute phase of infection. The lowest concentration of SARS-CoV-2 viral copies this assay can detect is 138 copies/mL. A negative result does not preclude SARS-Cov-2 infection and should not be used as the sole basis for treatment or other patient management decisions. A negative result may occur with  improper specimen collection/handling, submission of  specimen other than nasopharyngeal swab, presence of viral mutation(s) within the areas targeted by this assay, and inadequate number of viral copies(<138 copies/mL). A negative result must be combined with clinical observations, patient history, and epidemiological information. The expected result is Negative.  Fact Sheet for Patients:  EntrepreneurPulse.com.au  Fact Sheet for Healthcare Providers:  IncredibleEmployment.be  This test is no t yet approved or cleared by the [place] FDA and  has been authorized for detection and/or diagnosis of SARS-CoV-2 by FDA under an Emergency Use Authorization (EUA). This EUA will remain  in effect (meaning this test can be used) for the duration of the COVID-19 declaration under Section 564(b)(1) of the Act, 21 U.S.C.section 360bbb-3(b)(1), unless the authorization is terminated  or revoked sooner.       Influenza A by PCR NEGATIVE NEGATIVE Final   Influenza B by PCR NEGATIVE NEGATIVE Final    Comment: (NOTE) The Xpert Xpress SARS-CoV-2/FLU/RSV plus assay is intended as an aid in the diagnosis of influenza from Nasopharyngeal swab specimens and should not be used as a sole basis for treatment. Nasal washings and aspirates are unacceptable for Xpert Xpress SARS-CoV-2/FLU/RSV testing.  Fact Sheet for  Patients: EntrepreneurPulse.com.au  Fact Sheet for Healthcare Providers: IncredibleEmployment.be  This test is not yet approved or cleared by the [place] FDA and has been authorized for detection and/or diagnosis of SARS-CoV-2 by FDA under an Emergency Use Authorization (EUA). This EUA will remain in effect (meaning this test can be used) for the duration of the COVID-19 declaration under Section 564(b)(1) of the Act, 21 U.S.C. section 360bbb-3(b)(1), unless the authorization is terminated or revoked.  Performed at [facility], [place] [address], [place], [place] [address]    Creatinine: Recent Labs    02/18/21 1428 02/19/21 0318  CREATININE 0.53 0.38*   CT scan personally reviewed and is detailed in the history of present illness  Impression/Assessment:  Left ureteral calculus Ureteral obstruction secondary to calculus Renal colic Nausea/vomiting  Plan:  Proceed with cystoscopy with left ureteroscopy, laser lithotripsy, ureteral stent placement.  Risk and benefits discussed.  Marton Redwood, III 02/19/2021, 9:09 AM

## 2021-02-19 NOTE — Op Note (Signed)
Operative Note  Preoperative diagnosis:  1.  Left ureteral calculus  Postoperative diagnosis: 1.  Left ureteral calculus  Procedure(s): 1.  Cystoscopy with left retrograde pyelogram, left ureteroscopy with laser lithotripsy, ureteral stent placement  Surgeon: Link Snuffer, MD  Assistants: None  Anesthesia: General  Complications: None immediate  EBL: Minimal  Specimens: 1.  None  Drains/Catheters: 1.  6 x 24 double-J ureteral stent  Intraoperative findings: 1.  Normal urethra and bladder 2.  5 mm mid ureteral calculus unable to be basket extracted so laser fragmented to tiny fragments.  Retrograde pyelogram revealed minimal hydronephrosis.  No filling defects within the kidney.  Indication: 70 year old female with persistent nausea secondary to a left ureteral stone presents for the previously mentioned operation.  Description of procedure:  The patient was identified and consent was obtained.  The patient was taken to the operating room and placed in the supine position.  The patient was placed under general anesthesia.  Perioperative antibiotics were administered.  The patient was placed in dorsal lithotomy.  Patient was prepped and draped in a standard sterile fashion and a timeout was performed.  A 21 French rigid cystoscope was advanced into the urethra and into the bladder.  Complete cystoscopy was performed with no abnormal findings.  The left ureter was cannulated with a sensor wire which was advanced up to the kidney under fluoroscopic guidance.  Semirigid ureteroscopy was performed.  I tried to basket the stone but it met some resistance so I released it and laser fragmented the stone to tiny fragments.  Once there were no clinically significant stone fragments, I advanced the scope up to the renal pelvis and shot a retrograde pyelogram.  There were no other ureteral calculi.  The findings of the retrograde pyelogram are noted above.  I withdrew the scope visualizing the  ureter upon removal and again no clinically significant stone fragments were seen and no ureteral injury was seen.  I backloaded the wire onto the rigid cystoscope and advanced that into the bladder followed by routine placement of a 6 x 24 double-J ureteral stent.  Fluoroscopy confirmed proximal placement and direct visualization confirmed a good coil within the bladder.  I drained the bladder and withdrew the scope.  Patient tolerated procedure well was stable postoperative.  Plan: Follow-up in 1 week for stent removal

## 2021-02-19 NOTE — Progress Notes (Signed)
Patient is experiencing nausea and vomiting and stated that Zofran and Compazine have been ineffective. Messaged Audrea Muscat to request trying Phenergan.

## 2021-02-19 NOTE — Anesthesia Postprocedure Evaluation (Signed)
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  Procedure(s) Performed: CYSTOSCOPY/URETEROSCOPY/HOLMIUM LASER/STENT PLACEMENT (Left: Ureter)     Patient location during evaluation: PACU Anesthesia Type: General Level of consciousness: awake and alert, oriented and patient cooperative Pain management: pain level controlled Vital Signs Assessment: post-procedure vital signs reviewed and stable Respiratory status: spontaneous breathing, nonlabored ventilation and respiratory function stable Cardiovascular status: blood pressure returned to baseline and stable Postop Assessment: no apparent nausea or vomiting Anesthetic complications: no   No notable events documented.  Last Vitals:  Vitals:   02/19/21 1115 02/19/21 1130  BP: (!) 167/88 (!) 159/85  Pulse: 80 77  Resp: 20 18  Temp:    SpO2: 100% 100%    Last Pain:  Vitals:   02/19/21 1128  TempSrc:   PainSc: 0-No pain                 Lannie Fields

## 2021-02-19 NOTE — Anesthesia Procedure Notes (Addendum)
Procedure Name: Intubation Date/Time: 02/19/2021 10:02 AM Performed by: Pervis Hocking, DO Pre-anesthesia Checklist: Patient identified, Emergency Drugs available, Suction available and Patient being monitored Patient Re-evaluated:Patient Re-evaluated prior to induction Oxygen Delivery Method: Circle system utilized Preoxygenation: Pre-oxygenation with 100% oxygen Induction Type: IV induction and Rapid sequence Laryngoscope Size: Mac and 3 Grade View: Grade II Tube type: Oral Number of attempts: 2 Airway Equipment and Method: Stylet Placement Confirmation: ETT inserted through vocal cords under direct vision, positive ETCO2 and breath sounds checked- equal and bilateral Secured at: 21 cm Tube secured with: Tape Dental Injury: Teeth and Oropharynx as per pre-operative assessment  Comments: 2nd attempt MDA mac 3 blade grade 2b view. Small/recessed mandible, anterior airway

## 2021-02-19 NOTE — Progress Notes (Signed)
PT Cancellation Note  Patient Details Name: Haley Singh MRN: [id number] DOB: [birth year]   Cancelled Treatment:    Reason Eval/Treat Not Completed: Other (comment). Pt going for urology procedure this am, defer PT eval at this time and continue efforts    [facility] 02/19/2021, 9:30 AM

## 2021-02-19 NOTE — Progress Notes (Signed)
Patient refused potassium po at hs due to nausea.

## 2021-02-19 NOTE — Progress Notes (Signed)
OT Cancellation Note  Patient Details Name: Haley Singh MRN: [id number] DOB: 1950/11/25   Cancelled Treatment:    Reason Eval/Treat Not Completed: Patient at procedure or test/ unavailable Patient is in surgery. Will check back tomorrow.  Sharyn Blitz OTR/L, MS Acute Rehabilitation Department Office# [phone] Pager# [phone]    02/19/2021, 10:16 AM

## 2021-02-19 NOTE — Progress Notes (Signed)
PROGRESS NOTE    Haley Singh  ALP:[id number] DOB: 1950-07-27 DOA: 02/18/2021 PCP: Macy Mis, MD   Chief Complain: Intractable nausea/vomiting  Brief Narrative: Haley Singh is a 70 y.o. 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  On presentation, lab work showed potassium of 2.5.  CT abdomen/pelvis showed 5 mm partially obstructive stone in the left ureter.  Urology consulted today.  Undergoing cystoscopy with left ureteroscopy/laser lithotripsy/ureteral stent placement  Assessment & Plan:   Principal Problem:   Intractable nausea and vomiting Active Problems:   Mild persistent asthma without complication   Discitis of lumbar region   Hypokalemia   Intractable nausea/vomiting/loose stools:Had multiple episodes of vomiting  along with loose stools.  Unable to tolerate any food or drink by mouth. Started on IV fluids, started on clear liquid diet on admission. No report of significant abdominal pain, fever or chills. Suspicion for C. difficile as she is on cefazolin.  Follow-up C. difficile, GI pathogen panel.  No bowel movement after admission.  Patient also complains of mild generalized abdominal discomfort, no tenderness noted.  Abdominal x-ray did not show any acute findings. CT abdomen/pelvis showed 5 mm partially obstructing stone in the left ureter Continue antiemetics, supportive care. Consulted urology, discussed with Dr. Alvester Morin.  Could not find any significant etiology for intractable nausea and vomiting except for ureteral stone. Undergoing cystoscopy with left ureteroscopy/laser lithotripsy/ureteral stent placement   Severe hypokalemia: Potassium of 2.8, continue supplementation   Recent history of MSSA bacteremia/L4-L5 vertebral osteomyelitis/discitis:  Hospitalized for this on August this year.  TEE was negative for vegetation.  She is supposed to be on cefazolin 2 g 3 times a day till 10/5 .  She follows with ID as an outpatient.  We will continue cefazolin here. CT abdomen/pelvis done during this hospitalization showed PROGRESSIVE OSTEOMYELITIS with interval destruction of the endplates at L2-L3 compared to CT 01/23/2021.  We will consider MRI of the lumbar spine with contrast for further evaluation after stabilization of her nausea/vomiting/after urological procedure   Bilateral renal calculi: As seen on abd xray,chronic findings.5 mm partially obstructing stone in the left ureter.Urology consulted and plan as above   Mild persistent asthma : Continue bronchodilators as needed.  On Singulair.  Currently not in exacerbation   Severe hypertension: Most likely is due to inability to tolerate oral medication.  Continue PRN medications for severe hypertension.  On amlodipine and losartan at home which we will continued   History of neuropathy: On gabapentin   History of hyperlipidemia: On statin   History of depression: On Effexor, trazodone   Debility/deconditioning: Recently discharged from skilled nursing facility.  Looks significantly weak, lives alone.  Requested consultation for PT/OT           DVT prophylaxis:Lovenox Code Status: Full Family Communication:None at bedside  Status is: Observation  The patient remains OBS appropriate and will d/c before 2 midnights.  Dispo: The patient is from: Home              Anticipated d/c is to: Home/SNF              Patient currently is not medically stable to d/c.   Difficult to place patient No     Consultants: Urology  Procedures:As above  Antimicrobials:  Anti-infectives (From admission, onward)    Start  Dose/Rate Route Frequency Ordered Stop   02/18/21 1800  ceFAZolin (ANCEF) IVPB 2g/100 mL premix        2 g 200 mL/hr over 30 Minutes Intravenous Every 8 hours  02/18/21 1718         Subjective:  Patient seen and examined at the bedside this morning.  Hemodynamically stable.  Still complaining of severe nausea.  Not vomiting.  Complains of left flank pain today.  Objective: Vitals:   02/18/21 1827 02/18/21 1902 02/18/21 2131 02/19/21 0631  BP: (!) 177/84 (!) 160/84 (!) 168/82 (!) 163/80  Pulse:  84 79 83  Resp:  20 15 15   Temp:  99.3 F (37.4 C) 99.1 F (37.3 C) 99.3 F (37.4 C)  TempSrc:  Oral Oral Oral  SpO2:  100% 97% 97%  Weight:      Height:        Intake/Output Summary (Last 24 hours) at 02/19/2021 0749 Last data filed at 02/19/2021 02/21/2021 Gross per 24 hour  Intake 1257.98 ml  Output 2250 ml  Net -992.02 ml   Filed Weights   02/18/21 1712  Weight: 90.7 kg    Examination:  General exam: Overall comfortable, not in distress,weak appearing HEENT: PERRL Respiratory system:  no wheezes or crackles  Cardiovascular system: S1 & S2 heard, RRR.  Gastrointestinal system: Abdomen is nondistended, soft and nontender. Central nervous system: Alert and oriented Extremities: No edema, no clubbing ,no cyanosis Skin: No rashes, no ulcers,no icterus  .  PICC line on the right arm    Data Reviewed: I have personally reviewed following labs and imaging studies  CBC: Recent Labs  Lab 02/18/21 1428 02/19/21 0318  WBC 6.8 8.2  NEUTROABS 5.9  --   HGB 9.2* 11.3*  HCT 29.3* 35.1*  MCV 89.1 87.1  PLT 225 271   Basic Metabolic Panel: Recent Labs  Lab 02/18/21 1428 02/19/21 0318  NA 137 139  K 2.5* 2.8*  CL 103 101  CO2 23 25  GLUCOSE 165* 151*  BUN 8 <5*  CREATININE 0.53 0.38*  CALCIUM 8.1* 8.5*  MG 1.6*  --    GFR: Estimated Creatinine Clearance: 72.1 mL/min (A) (by C-G formula based on SCr of 0.38 mg/dL (L)). Liver Function Tests: Recent Labs  Lab 02/18/21 1428 02/19/21 0318  AST 13* 14*  ALT 5 5  ALKPHOS 100 108  BILITOT 0.8 0.8  PROT 7.6 8.8*  ALBUMIN 2.9* 3.4*   Recent Labs  Lab 02/18/21 1428   LIPASE 27   No results for input(s): AMMONIA in the last 168 hours. Coagulation Profile: No results for input(s): INR, PROTIME in the last 168 hours. Cardiac Enzymes: No results for input(s): CKTOTAL, CKMB, CKMBINDEX, TROPONINI in the last 168 hours. BNP (last 3 results) No results for input(s): PROBNP in the last 8760 hours. HbA1C: No results for input(s): HGBA1C in the last 72 hours. CBG: No results for input(s): GLUCAP in the last 168 hours. Lipid Profile: No results for input(s): CHOL, HDL, LDLCALC, TRIG, CHOLHDL, LDLDIRECT in the last 72 hours. Thyroid Function Tests: No results for input(s): TSH, T4TOTAL, FREET4, T3FREE, THYROIDAB in the last 72 hours. Anemia Panel: No results for input(s): VITAMINB12, FOLATE, FERRITIN, TIBC, IRON, RETICCTPCT in the last 72 hours. Sepsis Labs: No results for input(s): PROCALCITON, LATICACIDVEN in the last 168 hours.  Recent Results (from the past 240 hour(s))  Resp Panel by RT-PCR (Flu A&B, Covid) Nasopharyngeal Swab     Status: None   Collection Time: 02/18/21  6:15 PM  Specimen: Nasopharyngeal Swab; Nasopharyngeal(NP) swabs in vial transport medium  Result Value Ref Range Status   SARS Coronavirus 2 by RT PCR NEGATIVE NEGATIVE Final    Comment: (NOTE) SARS-CoV-2 target nucleic acids are NOT DETECTED.  The SARS-CoV-2 RNA is generally detectable in upper respiratory specimens during the acute phase of infection. The lowest concentration of SARS-CoV-2 viral copies this assay can detect is 138 copies/mL. A negative result does not preclude SARS-Cov-2 infection and should not be used as the sole basis for treatment or other patient management decisions. A negative result may occur with  improper specimen collection/handling, submission of specimen other than nasopharyngeal swab, presence of viral mutation(s) within the areas targeted by this assay, and inadequate number of viral copies(<138 copies/mL). A negative result must be combined  with clinical observations, patient history, and epidemiological information. The expected result is Negative.  Fact Sheet for Patients:  BloggerCourse.com  Fact Sheet for Healthcare Providers:  SeriousBroker.it  This test is no t yet approved or cleared by the [place] FDA and  has been authorized for detection and/or diagnosis of SARS-CoV-2 by FDA under an Emergency Use Authorization (EUA). This EUA will remain  in effect (meaning this test can be used) for the duration of the COVID-19 declaration under Section 564(b)(1) of the Act, 21 U.S.C.section 360bbb-3(b)(1), unless the authorization is terminated  or revoked sooner.       Influenza A by PCR NEGATIVE NEGATIVE Final   Influenza B by PCR NEGATIVE NEGATIVE Final    Comment: (NOTE) The Xpert Xpress SARS-CoV-2/FLU/RSV plus assay is intended as an aid in the diagnosis of influenza from Nasopharyngeal swab specimens and should not be used as a sole basis for treatment. Nasal washings and aspirates are unacceptable for Xpert Xpress SARS-CoV-2/FLU/RSV testing.  Fact Sheet for Patients: BloggerCourse.com  Fact Sheet for Healthcare Providers: SeriousBroker.it  This test is not yet approved or cleared by the [place] FDA and has been authorized for detection and/or diagnosis of SARS-CoV-2 by FDA under an Emergency Use Authorization (EUA). This EUA will remain in effect (meaning this test can be used) for the duration of the COVID-19 declaration under Section 564(b)(1) of the Act, 21 U.S.C. section 360bbb-3(b)(1), unless the authorization is terminated or revoked.  Performed at [facility], [address] [place], [place] [address]          Radiology Studies: CT ABDOMEN PELVIS W CONTRAST  Result Date: 02/18/2021 CLINICAL DATA:  Nausea and vomiting EXAM: CT ABDOMEN AND PELVIS WITH CONTRAST  TECHNIQUE: Multidetector CT imaging of the abdomen and pelvis was performed using the standard protocol following bolus administration of intravenous contrast. CONTRAST:  18mL OMNIPAQUE IOHEXOL 350 MG/ML SOLN COMPARISON:  None. FINDINGS: Lower chest: Lung bases are clear. Hepatobiliary: No focal hepatic lesion. No biliary duct dilatation. Common bile duct is normal. Pancreas: Pancreas is normal. No ductal dilatation. No pancreatic inflammation. Spleen: Normal spleen Adrenals/urinary tract: Adrenal glands normal. Bilateral nonobstructing renal calculi. There is a 5 mm calculus in the mid LEFT ureter measuring 5 mm (image 52/series 2). This calculus is at the pelvic brim. No bladder calculi. Bilateral homogeneous low-density renal lesions. Stomach/Bowel: Stomach, small bowel, appendix, and cecum are normal. The colon and rectosigmoid colon are normal. Vascular/Lymphatic: Abdominal aorta is normal caliber. No periportal or retroperitoneal adenopathy. No pelvic adenopathy. Reproductive: Uterus and adnexa unremarkable. Other: None Musculoskeletal: Severe arthropathy at L1-L2 and L2-L3 with endplate destruction. Endplate erosion at L2-L3 is advanced from CT 01/23/2021. Increased paraspinal soft tissue  at L2-L3 (image 35/2). Anterolisthesis of L4 on L5 is stable. IMPRESSION: 1. Partially obstructing calculus in the mid LEFT ureter. 2. Bilateral renal calculi. 3. PROGRESSIVE OSTEOMYELITIS with interval destruction of the endplates at L2-L3 compared to CT 01/23/2021. Recommend contrast MRI of the spine for evaluation of extent of disease. Electronically Signed   By: Genevive Bi M.D.   On: 02/18/2021 18:54   DG ABD ACUTE 2+V W 1V CHEST  Result Date: 02/18/2021 CLINICAL DATA:  Nausea and vomiting with abdominal pain. EXAM: DG ABDOMEN ACUTE WITH 1 VIEW CHEST COMPARISON:  CT abdomen and pelvis 01/23/2021. chest x-ray 12/16/2020. FINDINGS: Right upper extremity PICC terminates over the mid SVC. There is some linear areas  of scarring or atelectasis in the mid and lower lungs. There is no focal lung consolidation, pleural effusion or pneumothorax identified. Bowel gas pattern is nonobstructive. Bowel anastomosis is seen in the right abdomen. Overall, there is a paucity of bowel gas. Stomach is nondilated. There is no free air under the diaphragm. Bilateral renal calculi are again noted. There is a calcified uterine fibroid in the pelvis, unchanged. Degenerative changes affect the spine and hips. IMPRESSION: 1. Nonobstructive, nonspecific bowel gas pattern. Overall, there is a paucity of bowel gas which can be seen with fluid-filled bowel. Please correlate clinically. 2. Bilateral renal calculi. 3. No acute cardiopulmonary process. Electronically Signed   By: Darliss Cheney M.D.   On: 02/18/2021 16:27        Scheduled Meds:  alteplase  2 mg Intracatheter Once   amLODipine  5 mg Oral Daily   atorvastatin  40 mg Oral Daily   Chlorhexidine Gluconate Cloth  6 each Topical Daily   enoxaparin (LOVENOX) injection  40 mg Subcutaneous Q24H   fluticasone  1 spray Each Nare BID   gabapentin  600 mg Oral QHS   losartan  50 mg Oral Daily   montelukast  10 mg Oral Daily   potassium chloride  60 mEq Oral Once   sodium chloride flush  10-40 mL Intracatheter Q12H   traZODone  150 mg Oral QHS   venlafaxine XR  150 mg Oral q morning   Continuous Infusions:  sodium chloride 125 mL/hr at 02/19/21 0626    ceFAZolin (ANCEF) IV 2 g (02/19/21 0245)   magnesium sulfate bolus IVPB     ondansetron (ZOFRAN) IV 8 mg (02/18/21 2259)   potassium chloride       LOS: 0 days    Time spent: 25 mins.More than 50% of that time was spent in counseling and/or coordination of care.      Burnadette Pop, MD [facility] P9/18/2022, 7:49 AM

## 2021-02-19 NOTE — Anesthesia Preprocedure Evaluation (Addendum)
Anesthesia Evaluation  Patient identified by MRN, date of birth, ID band Patient awake    Reviewed: Allergy & Precautions, NPO status , Patient's Chart, lab work & pertinent test results  Airway Mallampati: III  TM Distance: >3 FB Neck ROM: Full    Dental  (+) Teeth Intact, Dental Advisory Given   Pulmonary asthma (well controlled) , neg sleep apnea,  Negative sleep study a few years Well controlled asthma- last inhaler use months ago   Pulmonary exam normal breath sounds clear to auscultation       Cardiovascular hypertension, Pt. on medications Normal cardiovascular exam+ Valvular Problems/Murmurs (mild MVP) MVP  Rhythm:Regular Rate:Normal  Echo 01/25/21: 1. Left ventricular ejection fraction, by estimation, is 60 to 65%. The  left ventricle has normal function.  2. Right ventricular systolic function is normal. The right ventricular  size is normal.  3. No left atrial/left atrial appendage thrombus was detected.  4. The mitral valve is abnormal. Trivial mitral valve regurgitation. No  evidence of mitral stenosis. There is mild holosystolic prolapse of the  middle scallop of the posterior leaflet of the mitral valve.  5. The aortic valve is tricuspid. Aortic valve regurgitation is trivial.  No aortic stenosis is present.  6. 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  7. Evidence of atrial level shunting detected by color flow Doppler.  Agitated saline contrast bubble study was negative, with no evidence of  any interatrial shunt. There is a small patent foramen ovale with  predominantly left to right shunting across  the atrial septum.    Neuro/Psych PSYCHIATRIC DISORDERS Anxiety Depression  Neuromuscular disease (RLS )    GI/Hepatic negative GI ROS, (+)     substance abuse  alcohol use,   Endo/Other  K 2.5 on arrival, up to 2.8 this AM- thought to be 2/2 persistent N/V with kidney  stone- got PO this AM, KCl currently infusing Obesity BMI 33  Renal/GU L ureteral stone, Cr 0.38  negative genitourinary   Musculoskeletal  (+) Arthritis , Osteoarthritis,  Chronic LBP   Recent history of MSSA bacteremia/L4-L5 vertebral osteomyelitis/discitis, hospitalized 01/2021- TEE neg, on cefazolin 2g TID until 10/5    Abdominal (+) + obese,   Peds  Hematology  (+) Blood dyscrasia, anemia , 11.3/35.1, plt 271   Anesthesia Other Findings   Reproductive/Obstetrics negative OB ROS                           Anesthesia Physical Anesthesia Plan  ASA: 3  Anesthesia Plan: General   Post-op Pain Management:    Induction: Intravenous and Rapid sequence  PONV Risk Score and Plan: Ondansetron, Dexamethasone, Midazolam and Treatment may vary due to age or medical condition  Airway Management Planned: Oral ETT  Additional Equipment: None  Intra-op Plan:   Post-operative Plan: Extubation in OR  Informed Consent: I have reviewed the patients History and Physical, chart, labs and discussed the procedure including the risks, benefits and alternatives for the proposed anesthesia with the patient or authorized representative who has indicated his/her understanding and acceptance.     Dental advisory given  Plan Discussed with: CRNA  Anesthesia Plan Comments:        Anesthesia Quick Evaluation

## 2021-02-19 NOTE — Transfer of Care (Signed)
Immediate Anesthesia Transfer of Care Note  Patient: Haley Singh  Procedure(s) Performed: Procedure(s): CYSTOSCOPY/URETEROSCOPY/HOLMIUM LASER/STENT PLACEMENT (Left)  Patient Location: PACU  Anesthesia Type:General  Level of Consciousness: Patient comfortable, drowsy.   Airway & Oxygen Therapy: Patient spontaneously breathing, ventilating well, oxygen via simple oxygen mask.  Post-op Assessment: Report given to PACU RN, vital signs reviewed and stable, moving all extremities.   Post vital signs: Reviewed and stable.  Complications: No apparent anesthesia complications Last Vitals:  Vitals Value Taken Time  BP 161/82 02/19/21 1049  Temp    Pulse 70 02/19/21 1051  Resp 18 02/19/21 1051  SpO2 100 % 02/19/21 1051  Vitals shown include unvalidated device data.  Last Pain:  Vitals:   02/19/21 0732  TempSrc:   PainSc: Asleep      Patients Stated Pain Goal: 2 (02/19/21 0615)  Complications: No notable events documented.

## 2021-02-20 ENCOUNTER — Observation Stay (HOSPITAL_COMMUNITY): Payer: Medicare PPO

## 2021-02-20 ENCOUNTER — Encounter (HOSPITAL_COMMUNITY): Payer: Self-pay | Admitting: Urology

## 2021-02-20 DIAGNOSIS — R112 Nausea with vomiting, unspecified: Secondary | ICD-10-CM | POA: Diagnosis not present

## 2021-02-20 LAB — CBC WITH DIFFERENTIAL/PLATELET
Abs Immature Granulocytes: 0.05 10*3/uL (ref 0.00–0.07)
Basophils Absolute: 0 10*3/uL (ref 0.0–0.1)
Basophils Relative: 0 %
Eosinophils Absolute: 0 10*3/uL (ref 0.0–0.5)
Eosinophils Relative: 0 %
HCT: 35 % — ABNORMAL LOW (ref 36.0–46.0)
Hemoglobin: 11.3 g/dL — ABNORMAL LOW (ref 12.0–15.0)
Immature Granulocytes: 1 %
Lymphocytes Relative: 8 %
Lymphs Abs: 0.7 10*3/uL (ref 0.7–4.0)
MCH: 27.8 pg (ref 26.0–34.0)
MCHC: 32.3 g/dL (ref 30.0–36.0)
MCV: 86 fL (ref 80.0–100.0)
Monocytes Absolute: 0.8 10*3/uL (ref 0.1–1.0)
Monocytes Relative: 10 %
Neutro Abs: 6.8 10*3/uL (ref 1.7–7.7)
Neutrophils Relative %: 81 %
Platelets: 302 10*3/uL (ref 150–400)
RBC: 4.07 MIL/uL (ref 3.87–5.11)
RDW: 15.8 % — ABNORMAL HIGH (ref 11.5–15.5)
WBC: 8.3 10*3/uL (ref 4.0–10.5)
nRBC: 0 % (ref 0.0–0.2)

## 2021-02-20 LAB — BASIC METABOLIC PANEL
Anion gap: 10 (ref 5–15)
BUN: 7 mg/dL — ABNORMAL LOW (ref 8–23)
CO2: 25 mmol/L (ref 22–32)
Calcium: 8.5 mg/dL — ABNORMAL LOW (ref 8.9–10.3)
Chloride: 101 mmol/L (ref 98–111)
Creatinine, Ser: 0.45 mg/dL (ref 0.44–1.00)
GFR, Estimated: >60 mL/min (ref >60)
Glucose, Bld: 164 mg/dL — ABNORMAL HIGH (ref 70–99)
Potassium: 3.5 mmol/L (ref 3.5–5.1)
Sodium: 136 mmol/L (ref 135–145)

## 2021-02-20 LAB — MAGNESIUM: Magnesium: 1.6 mg/dL — ABNORMAL LOW (ref 1.7–2.4)

## 2021-02-20 MED ORDER — GADOBUTROL 1 MMOL/ML IV SOLN
10.0000 mL | Freq: Once | INTRAVENOUS | Status: AC | PRN
  Administered 2021-02-20: 10 mL via INTRAVENOUS

## 2021-02-20 MED ORDER — AMLODIPINE BESYLATE 10 MG PO TABS
10.0000 mg | ORAL_TABLET | Freq: Every day | ORAL | Status: DC
  Administered 2021-02-20 – 2021-02-27 (×8): 10 mg via ORAL
  Filled 2021-02-20 (×9): qty 1

## 2021-02-20 MED ORDER — MAGNESIUM SULFATE 2 GM/50ML IV SOLN
2.0000 g | Freq: Once | INTRAVENOUS | Status: AC
  Administered 2021-02-20: 2 g via INTRAVENOUS
  Filled 2021-02-20: qty 50

## 2021-02-20 MED ORDER — FLUTICASONE FUROATE-VILANTEROL 100-25 MCG/INH IN AEPB
1.0000 | INHALATION_SPRAY | Freq: Every morning | RESPIRATORY_TRACT | Status: DC
  Administered 2021-02-20 – 2021-02-28 (×9): 1 via RESPIRATORY_TRACT
  Filled 2021-02-20: qty 28

## 2021-02-20 MED ORDER — POTASSIUM CHLORIDE CRYS ER 20 MEQ PO TBCR
40.0000 meq | EXTENDED_RELEASE_TABLET | Freq: Once | ORAL | Status: AC
  Administered 2021-02-20: 40 meq via ORAL
  Filled 2021-02-20: qty 2

## 2021-02-20 MED ORDER — ALUM & MAG HYDROXIDE-SIMETH 200-200-20 MG/5ML PO SUSP
30.0000 mL | ORAL | Status: DC | PRN
  Administered 2021-02-20: 30 mL via ORAL
  Filled 2021-02-20: qty 30

## 2021-02-20 NOTE — Evaluation (Signed)
Occupational Therapy Evaluation Patient Details Name: Haley Singh MRN: [id number] DOB: 1950-07-08 Today's Date: 02/20/2021   History of Present Illness Patient is a 70 year old female who presented with intractable nausea and vomitting. patient was found to have left ureteral stone. patient underwent a ureteral stent placement on 9/18. PMH: MDD, OA, chronic back pain, HTN, HLD, asthma, depression, anxiety, insomnia, R ankle fx, septic arthritis of spine   Clinical Impression   Patient is a 70 year old female who was admitted for above. Patient was living at home alone prior level. Currently, patient is min A for LB dressing, min guard for functional mobility in room with RW with continued safety education. Patient was noted to have decreased functional activity tolerance, decreased endurance, and decreased  standing balance impacting patients participation in ADLs. Patient would continue to benefit from skilled OT services at this time while admitted and after d/c to address noted deficits in order to improve overall safety and independence in ADLs.       Recommendations for follow up therapy are one component of a multi-disciplinary discharge planning process, led by the attending physician.  Recommendations may be updated based on patient status, additional functional criteria and insurance authorization.   Follow Up Recommendations  Supervision/Assistance - 24 hour;Home health OT    Equipment Recommendations  None recommended by OT    Recommendations for Other Services       Precautions / Restrictions Precautions Precautions: Fall;Back Precaution Comments: PICC line Required Braces or Orthoses: Spinal Brace Restrictions Weight Bearing Restrictions: No      Mobility Bed Mobility Overal bed mobility: Needs Assistance Bed Mobility: Supine to Sit     Supine to sit: HOB elevated;Min assist          Transfers Overall transfer level: Needs assistance Equipment used:  Rolling walker (2 wheeled) Transfers: Sit to/from Stand Sit to Stand: Min guard              Balance Overall balance assessment: Needs assistance Sitting-balance support: Feet supported;No upper extremity supported Sitting balance-Leahy Scale: Good     Standing balance support: During functional activity Standing balance-Leahy Scale: Fair Standing balance comment: with RW                           ADL either performed or assessed with clinical judgement   ADL Overall ADL's : Needs assistance/impaired Eating/Feeding: Set up;Sitting   Grooming: Sitting;Wash/dry face;Wash/dry hands;Set up   Upper Body Bathing: Set up;Sitting   Lower Body Bathing: Set up;Sit to/from stand;Min guard   Upper Body Dressing : Set up;Sitting Upper Body Dressing Details (indicate cue type and reason): patient was able to don back brace sitting on edge of bed. Lower Body Dressing: Minimal assistance;Sit to/from stand Lower Body Dressing Details (indicate cue type and reason): patient has back precautions and needs to use AE. could complete tasks with AE Toilet Transfer: Min guard;RW;Ambulation   Toileting- Clothing Manipulation and Hygiene: Min guard;Sit to/from stand       Functional mobility during ADLs: Min guard;Rolling walker General ADL Comments: patient attempted to walk into hallway with about 2 min standing tolerance prior to onset of SOB with patient leaning on front of walker. patient was repositioned in chair in room with patient requesting to sit in chair then 2 mins in requesting to get back into bed. patient was educated on importance of getting up with staff to use bathroom. patient requested pure wic. nurse made  aware.     Vision Patient Visual Report: No change from baseline       Perception     Praxis      Pertinent Vitals/Pain Pain Assessment: Faces Faces Pain Scale: Hurts little more Pain Location: back Pain Descriptors / Indicators:  Discomfort;Grimacing Pain Intervention(s): Limited activity within patient's tolerance;Monitored during session     Hand Dominance Right   Extremity/Trunk Assessment Upper Extremity Assessment Upper Extremity Assessment: Overall WFL for tasks assessed   Lower Extremity Assessment Lower Extremity Assessment: Defer to PT evaluation   Cervical / Trunk Assessment Cervical / Trunk Assessment: Normal   Communication Communication Communication: No difficulties   Cognition Arousal/Alertness: Awake/alert Behavior During Therapy: WFL for tasks assessed/performed Overall Cognitive Status: Within Functional Limits for tasks assessed                                 General Comments: patient was noted to be in rush to complete all tasks with multiple requests to "just go home today"   General Comments       Exercises     Shoulder Instructions      Home Living Family/patient expects to be discharged to:: Private residence Living Arrangements: Alone Available Help at Discharge: Friend(s);Neighbor;Available PRN/intermittently Type of Home: House Home Access: Stairs to enter [employer] of Steps: 3 Entrance Stairs-Rails: None Home Layout: Multi-level Alternate Level Stairs-Number of Steps: 14 Alternate Level Stairs-Rails: Right Bathroom Shower/Tub: Tub/shower unit;Walk-in shower   Bathroom Toilet: Handicapped height Bathroom Accessibility: Yes   Home Equipment: Emergency planning/management officer - 4 wheels;Cane - single point;Grab bars - tub/shower   Additional Comments: Pt reports I with ADLs and has been remaining upstairs in bedroom until she has to go downstairs.  Has neighbors around intermittnely but elderly and unable to provide physical assist if needed.recent stay at [facility] place with patient unwilling to endource to go back to SNF      Prior Functioning/Environment Level of Independence: Independent with assistive device(s)        Comments: interchangable  with use of rollator and cane        OT Problem List: Decreased strength;Decreased activity tolerance;Impaired balance (sitting and/or standing);Decreased safety awareness;Cardiopulmonary status limiting activity;Decreased knowledge of precautions;Decreased knowledge of use of DME or AE      OT Treatment/Interventions: Self-care/ADL training;Therapeutic exercise;DME and/or AE instruction;Therapeutic activities;Balance training;Patient/family education    OT Goals(Current goals can be found in the care plan section) Acute Rehab OT Goals Patient Stated Goal: to go home today. OT Goal Formulation: With patient Time For Goal Achievement: 03/06/21 Potential to Achieve Goals: Good  OT Frequency: Min 2X/week   Barriers to D/C:    patient lives at home alone       Co-evaluation              AM-PAC OT "6 Clicks" Daily Activity     Outcome Measure Help from another person eating meals?: None Help from another person taking care of personal grooming?: None Help from another person toileting, which includes using toliet, bedpan, or urinal?: A Little Help from another person bathing (including washing, rinsing, drying)?: A Little Help from another person to put on and taking off regular upper body clothing?: A Little Help from another person to put on and taking off regular lower body clothing?: A Little 6 Click Score: 20   End of Session Equipment Utilized During Treatment: Gait belt;Rolling walker Nurse Communication: Mobility status  Activity Tolerance: Patient tolerated treatment well Patient left: in bed;with call bell/phone within reach  OT Visit Diagnosis: Unsteadiness on feet (R26.81);Muscle weakness (generalized) (M62.81);Pain Pain - part of body:  (back)                Time: 6213-0865 OT Time Calculation (min): 20 min Charges:  OT General Charges $OT Visit: 1 Visit OT Evaluation $OT Eval Low Complexity: 1 Low  Viann Shove, MS Acute Rehabilitation  Department Office# [phone] Pager# [phone]   Chalmers Guest Elina Streng 02/20/2021, 9:24 AM

## 2021-02-20 NOTE — Progress Notes (Signed)
PROGRESS NOTE    Haley Singh  JKK:[id number] DOB: 1951-03-11 DOA: 02/18/2021 PCP: Macy Mis, MD   Chief Complain: Intractable nausea/vomiting  Brief Narrative: Haley Singh is a 70 y.o. 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  On presentation, lab work showed potassium of 2.5.  CT abdomen/pelvis showed 5 mm partially obstructive stone in the left ureter.  Urology consulted and she underwent  cystoscopy with left ureteroscopy/laser lithotripsy/ureteral stent placement on 02/19/21  Assessment & Plan:   Principal Problem:   Intractable nausea and vomiting Active Problems:   Mild persistent asthma without complication   Discitis of lumbar region   Hypokalemia   Intractable nausea/vomiting/loose stools:Had multiple episodes of vomiting  along with loose stools.  Unable to tolerate any food or drink by mouth. Started on IV fluids, started on clear liquid diet on admission. No report of significant abdominal pain, fever or chills. Suspicion for C. difficile as she is on cefazolin.  Follow-up C. difficile, GI pathogen panel.  No bowel movement after admission.  Patient also complains of mild generalized abdominal discomfort, no tenderness noted.  Abdominal x-ray did not show any acute findings. CT abdomen/pelvis showed 5 mm partially obstructing stone in the left ureter Continue antiemetics, supportive care. Consulted urology, discussed with Dr. Alvester Morin.  Could not find any significant etiology for intractable nausea and vomiting except for ureteral stone.Underwent  cystoscopy with left ureteroscopy/laser lithotripsy/ureteral stent placement   Hypokalemia/hypomagnesemia: Continue to supplement and monitor.  Recent history of MSSA bacteremia/L4-L5 vertebral osteomyelitis/discitis:  Hospitalized for this on August this year.  TEE was negative for vegetation.  She is supposed to be on cefazolin 2 g 3 times a day till 10/5 .  She follows with ID as an outpatient.  We will continue cefazolin here. CT abdomen/pelvis done during this hospitalization showed PROGRESSIVE OSTEOMYELITIS with interval destruction of the endplates at L2-L3 compared to CT 01/23/2021.  We have ordered MRI of the lumbar spine with contrast for further evaluation   Bilateral renal calculi: As seen on abd xray,chronic findings.5 mm partially obstructing stone in the left ureter.management as above   Mild persistent asthma : Continue bronchodilators as needed.  On Singulair.  Currently not in exacerbation   Severe hypertension: Most likely is due to inability to tolerate oral medication.  Continue PRN medications for severe hypertension.  On amlodipine and losartan at home which we will continued, increased the dose of amlodipine to 10 mg.   History of neuropathy: On gabapentin   History of hyperlipidemia: On statin   History of depression: On Effexor, trazodone   Debility/deconditioning: Recently discharged from skilled nursing facility.  Looks significantly weak, lives alone.  Requested consultation for PT/OT, recommending home health           DVT prophylaxis:Lovenox Code Status: Full Family Communication:None at bedside  Status is: Observation  Dispo: The patient is from: Home              Anticipated d/c is to: Home with home health              Patient currently is not medically stable to d/c.   Difficult to place patient No     Consultants: Urology  Procedures:As above  Antimicrobials:  Anti-infectives (From admission, onward)    Start     Dose/Rate Route Frequency Ordered Stop  02/18/21 1800  ceFAZolin (ANCEF) IVPB 2g/100 mL premix        2 g 200 mL/hr over 30 Minutes Intravenous Every 8 hours 02/18/21 1718         Subjective:  Patient seen and examined the bedside  this morning.  Hemodynamically stable.  Her nausea has resolved.  CBC looks weak.  She was working with occupational therapist.  She states she desperately  wants to go home.  We discussed about the importance of MRI of the lumbar spine.  Objective: Vitals:   02/19/21 2257 02/20/21 0046 02/20/21 0227 02/20/21 0512  BP: (!) 169/83 (!) 178/95 (!) 166/85 (!) 165/87  Pulse: 90 100 80 83  Resp: 20 20 16 18   Temp:  98.4 F (36.9 C) 98.4 F (36.9 C) 98.6 F (37 C)  TempSrc:  Oral Oral Oral  SpO2: 96% 96% 98% 96%  Weight:      Height:        Intake/Output Summary (Last 24 hours) at 02/20/2021 0758 Last data filed at 02/20/2021 0600 Gross per 24 hour  Intake 3263.76 ml  Output 1656 ml  Net 1607.76 ml   Filed Weights   02/18/21 1712  Weight: 90.7 kg    Examination:  General exam: Overall comfortable, not in distress HEENT: PERRL Respiratory system:  no wheezes or crackles  Cardiovascular system: S1 & S2 heard, RRR.  Gastrointestinal system: Abdomen is nondistended, soft and nontender. Central nervous system: Alert and oriented Extremities: No edema, no clubbing ,no cyanosis Skin: No rashes, no ulcers,no icterus  ,picc line on the right arm    Data Reviewed: I have personally reviewed following labs and imaging studies  CBC: Recent Labs  Lab 02/18/21 1428 02/19/21 0318 02/20/21 0242  WBC 6.8 8.2 8.3  NEUTROABS 5.9  --  6.8  HGB 9.2* 11.3* 11.3*  HCT 29.3* 35.1* 35.0*  MCV 89.1 87.1 86.0  PLT 225 271 302   Basic Metabolic Panel: Recent Labs  Lab 02/18/21 1428 02/19/21 0318 02/19/21 1413 02/20/21 0242  NA 137 139  --  136  K 2.5* 2.8* 3.9 3.5  CL 103 101  --  101  CO2 23 25  --  25  GLUCOSE 165* 151*  --  164*  BUN 8 <5*  --  7*  CREATININE 0.53 0.38*  --  0.45  CALCIUM 8.1* 8.5*  --  8.5*  MG 1.6*  --   --  1.6*   GFR: Estimated Creatinine Clearance: 72.1 mL/min (by C-G formula based on SCr of 0.45 mg/dL). Liver Function Tests: Recent Labs  Lab  02/18/21 1428 02/19/21 0318  AST 13* 14*  ALT 5 5  ALKPHOS 100 108  BILITOT 0.8 0.8  PROT 7.6 8.8*  ALBUMIN 2.9* 3.4*   Recent Labs  Lab 02/18/21 1428  LIPASE 27   No results for input(s): AMMONIA in the last 168 hours. Coagulation Profile: No results for input(s): INR, PROTIME in the last 168 hours. Cardiac Enzymes: No results for input(s): CKTOTAL, CKMB, CKMBINDEX, TROPONINI in the last 168 hours. BNP (last 3 results) No results for input(s): PROBNP in the last 8760 hours. HbA1C: No results for input(s): HGBA1C in the last 72 hours. CBG: No results for input(s): GLUCAP in the last 168 hours. Lipid Profile: No results for input(s): CHOL, HDL, LDLCALC, TRIG, CHOLHDL, LDLDIRECT in the last 72 hours. Thyroid Function Tests: No results for input(s): TSH, T4TOTAL, FREET4, T3FREE, THYROIDAB in the last 72 hours. Anemia Panel: No results for input(s): VITAMINB12,  FOLATE, FERRITIN, TIBC, IRON, RETICCTPCT in the last 72 hours. Sepsis Labs: No results for input(s): PROCALCITON, LATICACIDVEN in the last 168 hours.  Recent Results (from the past 240 hour(s))  Resp Panel by RT-PCR (Flu A&B, Covid) Nasopharyngeal Swab     Status: None   Collection Time: 02/18/21  6:15 PM   Specimen: Nasopharyngeal Swab; Nasopharyngeal(NP) swabs in vial transport medium  Result Value Ref Range Status   SARS Coronavirus 2 by RT PCR NEGATIVE NEGATIVE Final    Comment: (NOTE) SARS-CoV-2 target nucleic acids are NOT DETECTED.  The SARS-CoV-2 RNA is generally detectable in upper respiratory specimens during the acute phase of infection. The lowest concentration of SARS-CoV-2 viral copies this assay can detect is 138 copies/mL. A negative result does not preclude SARS-Cov-2 infection and should not be used as the sole basis for treatment or other patient management decisions. A negative result may occur with  improper specimen collection/handling, submission of specimen other than nasopharyngeal swab,  presence of viral mutation(s) within the areas targeted by this assay, and inadequate number of viral copies(<138 copies/mL). A negative result must be combined with clinical observations, patient history, and epidemiological information. The expected result is Negative.  Fact Sheet for Patients:  BloggerCourse.com  Fact Sheet for Healthcare Providers:  SeriousBroker.it  This test is no t yet approved or cleared by the [place] FDA and  has been authorized for detection and/or diagnosis of SARS-CoV-2 by FDA under an Emergency Use Authorization (EUA). This EUA will remain  in effect (meaning this test can be used) for the duration of the COVID-19 declaration under Section 564(b)(1) of the Act, 21 U.S.C.section 360bbb-3(b)(1), unless the authorization is terminated  or revoked sooner.       Influenza A by PCR NEGATIVE NEGATIVE Final   Influenza B by PCR NEGATIVE NEGATIVE Final    Comment: (NOTE) The Xpert Xpress SARS-CoV-2/FLU/RSV plus assay is intended as an aid in the diagnosis of influenza from Nasopharyngeal swab specimens and should not be used as a sole basis for treatment. Nasal washings and aspirates are unacceptable for Xpert Xpress SARS-CoV-2/FLU/RSV testing.  Fact Sheet for Patients: BloggerCourse.com  Fact Sheet for Healthcare Providers: SeriousBroker.it  This test is not yet approved or cleared by the [place] FDA and has been authorized for detection and/or diagnosis of SARS-CoV-2 by FDA under an Emergency Use Authorization (EUA). This EUA will remain in effect (meaning this test can be used) for the duration of the COVID-19 declaration under Section 564(b)(1) of the Act, 21 U.S.C. section 360bbb-3(b)(1), unless the authorization is terminated or revoked.  Performed at [facility], [address], [place], [place] [address]           Radiology Studies: CT ABDOMEN PELVIS W CONTRAST  Result Date: 02/18/2021 CLINICAL DATA:  Nausea and vomiting EXAM: CT ABDOMEN AND PELVIS WITH CONTRAST TECHNIQUE: Multidetector CT imaging of the abdomen and pelvis was performed using the standard protocol following bolus administration of intravenous contrast. CONTRAST:  67mL OMNIPAQUE IOHEXOL 350 MG/ML SOLN COMPARISON:  None. FINDINGS: Lower chest: Lung bases are clear. Hepatobiliary: No focal hepatic lesion. No biliary duct dilatation. Common bile duct is normal. Pancreas: Pancreas is normal. No ductal dilatation. No pancreatic inflammation. Spleen: Normal spleen Adrenals/urinary tract: Adrenal glands normal. Bilateral nonobstructing renal calculi. There is a 5 mm calculus in the mid LEFT ureter measuring 5 mm (image 52/series 2). This calculus is at the pelvic brim. No bladder calculi. Bilateral homogeneous low-density renal lesions. Stomach/Bowel: Stomach, small  bowel, appendix, and cecum are normal. The colon and rectosigmoid colon are normal. Vascular/Lymphatic: Abdominal aorta is normal caliber. No periportal or retroperitoneal adenopathy. No pelvic adenopathy. Reproductive: Uterus and adnexa unremarkable. Other: None Musculoskeletal: Severe arthropathy at L1-L2 and L2-L3 with endplate destruction. Endplate erosion at L2-L3 is advanced from CT 01/23/2021. Increased paraspinal soft tissue at L2-L3 (image 35/2). Anterolisthesis of L4 on L5 is stable. IMPRESSION: 1. Partially obstructing calculus in the mid LEFT ureter. 2. Bilateral renal calculi. 3. PROGRESSIVE OSTEOMYELITIS with interval destruction of the endplates at L2-L3 compared to CT 01/23/2021. Recommend contrast MRI of the spine for evaluation of extent of disease. Electronically Signed   By: Genevive Bi M.D.   On: 02/18/2021 18:54   DG ABD ACUTE 2+V W 1V CHEST  Result Date: 02/18/2021 CLINICAL DATA:  Nausea and vomiting with abdominal pain. EXAM: DG ABDOMEN ACUTE WITH 1 VIEW  CHEST COMPARISON:  CT abdomen and pelvis 01/23/2021. chest x-ray 12/16/2020. FINDINGS: Right upper extremity PICC terminates over the mid SVC. There is some linear areas of scarring or atelectasis in the mid and lower lungs. There is no focal lung consolidation, pleural effusion or pneumothorax identified. Bowel gas pattern is nonobstructive. Bowel anastomosis is seen in the right abdomen. Overall, there is a paucity of bowel gas. Stomach is nondilated. There is no free air under the diaphragm. Bilateral renal calculi are again noted. There is a calcified uterine fibroid in the pelvis, unchanged. Degenerative changes affect the spine and hips. IMPRESSION: 1. Nonobstructive, nonspecific bowel gas pattern. Overall, there is a paucity of bowel gas which can be seen with fluid-filled bowel. Please correlate clinically. 2. Bilateral renal calculi. 3. No acute cardiopulmonary process. Electronically Signed   By: Darliss Cheney M.D.   On: 02/18/2021 16:27   DG C-Arm 1-60 Min-No Report  Result Date: 02/19/2021 Fluoroscopy was utilized by the requesting physician.  No radiographic interpretation.        Scheduled Meds:  alteplase  2 mg Intracatheter Once   amLODipine  10 mg Oral Daily   atorvastatin  40 mg Oral Daily   Chlorhexidine Gluconate Cloth  6 each Topical Daily   enoxaparin (LOVENOX) injection  40 mg Subcutaneous Q24H   fluticasone  1 spray Each Nare BID   gabapentin  600 mg Oral QHS   losartan  50 mg Oral Daily   montelukast  10 mg Oral Daily   potassium chloride  40 mEq Oral Once   sodium chloride flush  10-40 mL Intracatheter Q12H   traZODone  150 mg Oral QHS   venlafaxine XR  150 mg Oral q morning   Continuous Infusions:   ceFAZolin (ANCEF) IV 2 g (02/20/21 0136)   magnesium sulfate bolus IVPB     ondansetron (ZOFRAN) IV 8 mg (02/18/21 2259)     LOS: 0 days    Time spent: 25 mins.More than 50% of that time was spent in counseling and/or coordination of care.      Burnadette Pop, MD [facility] P9/19/2022, 7:58 AM

## 2021-02-20 NOTE — Progress Notes (Signed)
Patient wanted to be d/c home.  Explained to pt MD cant not release pt due to her medical condition. Patient said " I know but I still want to go home because I cant eat or sleep here."  Explained to pt  risks of leaving  Against medical advice would decline her health condition and death. Notified MD. Pt concerns insurance wont pay her hospitalization if she leaves AMA. She decided to stay.

## 2021-02-20 NOTE — Plan of Care (Signed)
  Problem: Activity: Goal: Risk for activity intolerance will decrease Outcome: Progressing   Problem: Pain Managment: Goal: General experience of comfort will improve Outcome: Progressing   Problem: Safety: Goal: Ability to remain free from injury will improve Outcome: Progressing   

## 2021-02-20 NOTE — Evaluation (Signed)
Physical Therapy Evaluation Patient Details Name: Haley Singh MRN: [id number] DOB: 1951/03/31 Today's Date: 02/20/2021  History of Present Illness  Patient is a 70 year old female who presented with intractable nausea and vomitting. patient was found to have left ureteral stone. patient underwent a ureteral stent placement on 9/18. PMH: MDD, OA, chronic back pain, HTN, HLD, asthma, depression, anxiety, insomnia, R ankle fx, septic arthritis of spine  Clinical Impression  Pt admitted with above diagnosis.  See below for mobility details, pt amb ~ 160' with RW and min/guard assist. Recommend HHPT and continued assist from CNA she states she has hired. Pt reports she has been sleepign in lift chair at home  Pt currently with functional limitations due to the deficits listed below (see PT Problem List). Pt will benefit from skilled PT to increase their independence and safety with mobility to allow discharge to the venue listed below.          Recommendations for follow up therapy are one component of a multi-disciplinary discharge planning process, led by the attending physician.  Recommendations may be updated based on patient status, additional functional criteria and insurance authorization.  Follow Up Recommendations Home health PT    Equipment Recommendations  None recommended by PT    Recommendations for Other Services       Precautions / Restrictions Precautions Precautions: Fall;Back Required Braces or Orthoses: Spinal Brace Spinal Brace: Lumbar corset;Applied in sitting position (had prior to this adm, pt applies in sitting) Restrictions Weight Bearing Restrictions: No      Mobility  Bed Mobility   Bed Mobility: Rolling;Sidelying to Sit;Sit to Sidelying Rolling: Supervision Sidelying to sit: Supervision     Sit to sidelying: Min assist;Min guard General bed mobility comments: incr time, pt log rolls with supervision    Transfers Overall transfer level: Needs  assistance Equipment used: Rolling walker (2 wheeled) Transfers: Sit to/from Stand Sit to Stand: Min guard         General transfer comment: for safety  Ambulation/Gait Ambulation/Gait assistance: Min guard Gait Distance (Feet): 160 Feet Assistive device: Rolling walker (2 wheeled) Gait Pattern/deviations: Step-through pattern;Decreased stride length;Trunk flexed     General Gait Details: cues for posture, pt reports fattigue, mildly unsteady initially however improved with distance  Stairs            Wheelchair Mobility    Modified Rankin (Stroke Patients Only)       Balance Overall balance assessment: Needs assistance Sitting-balance support: Feet supported;No upper extremity supported Sitting balance-Leahy Scale: Good Sitting balance - Comments: pt dons and doffs LSO I'ly in sitting   Standing balance support: During functional activity;Bilateral upper extremity supported Standing balance-Leahy Scale: Fair Standing balance comment: briefly able to maintain static standing without support; reliant on UEs for dynamic tasks                             Pertinent Vitals/Pain Pain Assessment: No/denies pain Faces Pain Scale: Hurts little more Pain Location: back Pain Descriptors / Indicators: Discomfort;Grimacing Pain Intervention(s): Limited activity within patient's tolerance;Monitored during session;Repositioned    Home Living Family/patient expects to be discharged to:: Private residence Living Arrangements: Alone Available Help at Discharge: Friend(s);Neighbor;Available PRN/intermittently Type of Home: House Home Access: Stairs to enter Entrance Stairs-Rails: None Entrance Stairs-Number of Steps: 3 Home Layout: Two level Home Equipment: Emergency planning/management officer - 4 wheels;Cane - single point;Grab bars - tub/shower Additional Comments: Pt reports she has been going  up/down stairs without difficulty. states she has CNA 6 hrs per day and will resume  this at d/c    Prior Function Level of Independence: Independent with assistive device(s)         Comments: interchangable with use of rollator and cane.     Hand Dominance        Extremity/Trunk Assessment   Upper Extremity Assessment Upper Extremity Assessment: Overall WFL for tasks assessed    Lower Extremity Assessment Lower Extremity Assessment: Overall WFL for tasks assessed       Communication   Communication: No difficulties  Cognition   Behavior During Therapy: Flat affect Overall Cognitive Status: Within Functional Limits for tasks assessed                                        General Comments      Exercises     Assessment/Plan    PT Assessment Patient needs continued PT services  PT Problem List Decreased strength;Decreased mobility;Decreased activity tolerance;Decreased balance;Decreased knowledge of use of DME;Pain       PT Treatment Interventions DME instruction;Therapeutic activities;Gait training;Functional mobility training;Therapeutic exercise;Patient/family education;Balance training;Stair training    PT Goals (Current goals can be found in the Care Plan section)  Acute Rehab PT Goals Patient Stated Goal: to go home today. PT Goal Formulation: With patient Time For Goal Achievement: 03/06/21 Potential to Achieve Goals: Good    Frequency Min 3X/week   Barriers to discharge        Co-evaluation               AM-PAC PT "6 Clicks" Mobility  Outcome Measure Help needed turning from your back to your side while in a flat bed without using bedrails?: A Little Help needed moving from lying on your back to sitting on the side of a flat bed without using bedrails?: A Little Help needed moving to and from a bed to a chair (including a wheelchair)?: A Little Help needed standing up from a chair using your arms (e.g., wheelchair or bedside chair)?: A Little Help needed to walk in hospital room?: A Little Help needed  climbing 3-5 steps with a railing? : A Little 6 Click Score: 18    End of Session Equipment Utilized During Treatment: Gait belt Activity Tolerance: Patient tolerated treatment well Patient left: in bed;with call bell/phone within reach;with bed alarm set Nurse Communication: Mobility status PT Visit Diagnosis: Other abnormalities of gait and mobility (R26.89)    Time: 7782-4235 PT Time Calculation (min) (ACUTE ONLY): 15 min   Charges:   PT Evaluation $PT Eval Low Complexity: 1 Low          Uri Turnbough, PT  Acute Rehab Dept ([facility]) [phone] Pager [phone]  02/20/2021   [facility] 02/20/2021, 2:00 PM

## 2021-02-21 ENCOUNTER — Encounter (HOSPITAL_COMMUNITY): Payer: Self-pay | Admitting: Internal Medicine

## 2021-02-21 DIAGNOSIS — Z8249 Family history of ischemic heart disease and other diseases of the circulatory system: Secondary | ICD-10-CM | POA: Diagnosis not present

## 2021-02-21 DIAGNOSIS — Z20822 Contact with and (suspected) exposure to covid-19: Secondary | ICD-10-CM | POA: Diagnosis present

## 2021-02-21 DIAGNOSIS — N139 Obstructive and reflux uropathy, unspecified: Secondary | ICD-10-CM

## 2021-02-21 DIAGNOSIS — G062 Extradural and subdural abscess, unspecified: Secondary | ICD-10-CM

## 2021-02-21 DIAGNOSIS — K6812 Psoas muscle abscess: Secondary | ICD-10-CM | POA: Diagnosis present

## 2021-02-21 DIAGNOSIS — G2581 Restless legs syndrome: Secondary | ICD-10-CM | POA: Diagnosis present

## 2021-02-21 DIAGNOSIS — N135 Crossing vessel and stricture of ureter without hydronephrosis: Secondary | ICD-10-CM | POA: Diagnosis present

## 2021-02-21 DIAGNOSIS — N23 Unspecified renal colic: Secondary | ICD-10-CM

## 2021-02-21 DIAGNOSIS — R112 Nausea with vomiting, unspecified: Secondary | ICD-10-CM | POA: Diagnosis not present

## 2021-02-21 DIAGNOSIS — M4646 Discitis, unspecified, lumbar region: Secondary | ICD-10-CM

## 2021-02-21 DIAGNOSIS — Z880 Allergy status to penicillin: Secondary | ICD-10-CM | POA: Diagnosis not present

## 2021-02-21 DIAGNOSIS — M4626 Osteomyelitis of vertebra, lumbar region: Secondary | ICD-10-CM | POA: Diagnosis present

## 2021-02-21 DIAGNOSIS — M4620 Osteomyelitis of vertebra, site unspecified: Secondary | ICD-10-CM

## 2021-02-21 DIAGNOSIS — M4636 Infection of intervertebral disc (pyogenic), lumbar region: Secondary | ICD-10-CM | POA: Diagnosis present

## 2021-02-21 DIAGNOSIS — Z803 Family history of malignant neoplasm of breast: Secondary | ICD-10-CM | POA: Diagnosis not present

## 2021-02-21 DIAGNOSIS — Z8619 Personal history of other infectious and parasitic diseases: Secondary | ICD-10-CM | POA: Diagnosis not present

## 2021-02-21 DIAGNOSIS — N132 Hydronephrosis with renal and ureteral calculous obstruction: Secondary | ICD-10-CM | POA: Diagnosis present

## 2021-02-21 DIAGNOSIS — Z87442 Personal history of urinary calculi: Secondary | ICD-10-CM | POA: Diagnosis not present

## 2021-02-21 DIAGNOSIS — Z7951 Long term (current) use of inhaled steroids: Secondary | ICD-10-CM | POA: Diagnosis not present

## 2021-02-21 DIAGNOSIS — Z881 Allergy status to other antibiotic agents status: Secondary | ICD-10-CM | POA: Diagnosis not present

## 2021-02-21 DIAGNOSIS — J4530 Mild persistent asthma, uncomplicated: Secondary | ICD-10-CM | POA: Diagnosis present

## 2021-02-21 DIAGNOSIS — Z79899 Other long term (current) drug therapy: Secondary | ICD-10-CM | POA: Diagnosis not present

## 2021-02-21 DIAGNOSIS — Z885 Allergy status to narcotic agent status: Secondary | ICD-10-CM | POA: Diagnosis not present

## 2021-02-21 DIAGNOSIS — I10 Essential (primary) hypertension: Secondary | ICD-10-CM | POA: Diagnosis present

## 2021-02-21 DIAGNOSIS — E785 Hyperlipidemia, unspecified: Secondary | ICD-10-CM | POA: Diagnosis present

## 2021-02-21 DIAGNOSIS — E876 Hypokalemia: Secondary | ICD-10-CM | POA: Diagnosis present

## 2021-02-21 DIAGNOSIS — Z66 Do not resuscitate: Secondary | ICD-10-CM | POA: Diagnosis not present

## 2021-02-21 DIAGNOSIS — G8929 Other chronic pain: Secondary | ICD-10-CM | POA: Diagnosis present

## 2021-02-21 HISTORY — DX: Extradural and subdural abscess, unspecified: G06.2

## 2021-02-21 HISTORY — DX: Osteomyelitis of vertebra, site unspecified: M46.20

## 2021-02-21 LAB — BASIC METABOLIC PANEL
Anion gap: 9 (ref 5–15)
BUN: 8 mg/dL (ref 8–23)
CO2: 25 mmol/L (ref 22–32)
Calcium: 8 mg/dL — ABNORMAL LOW (ref 8.9–10.3)
Chloride: 97 mmol/L — ABNORMAL LOW (ref 98–111)
Creatinine, Ser: 0.52 mg/dL (ref 0.44–1.00)
GFR, Estimated: >60 mL/min (ref >60)
Glucose, Bld: 140 mg/dL — ABNORMAL HIGH (ref 70–99)
Potassium: 3.1 mmol/L — ABNORMAL LOW (ref 3.5–5.1)
Sodium: 131 mmol/L — ABNORMAL LOW (ref 135–145)

## 2021-02-21 LAB — MAGNESIUM: Magnesium: 2.1 mg/dL (ref 1.7–2.4)

## 2021-02-21 MED ORDER — ONDANSETRON HCL 4 MG/2ML IJ SOLN
4.0000 mg | Freq: Four times a day (QID) | INTRAMUSCULAR | Status: DC | PRN
  Administered 2021-02-21: 4 mg via INTRAVENOUS
  Filled 2021-02-21: qty 2

## 2021-02-21 MED ORDER — LOSARTAN POTASSIUM 50 MG PO TABS
100.0000 mg | ORAL_TABLET | Freq: Every day | ORAL | Status: DC
  Administered 2021-02-21 – 2021-02-27 (×7): 100 mg via ORAL
  Filled 2021-02-21 (×8): qty 2

## 2021-02-21 MED ORDER — ENOXAPARIN SODIUM 40 MG/0.4ML IJ SOSY
40.0000 mg | PREFILLED_SYRINGE | INTRAMUSCULAR | Status: DC
  Administered 2021-02-22 – 2021-02-27 (×6): 40 mg via SUBCUTANEOUS
  Filled 2021-02-21 (×6): qty 0.4

## 2021-02-21 MED ORDER — AMOXICILLIN 500 MG PO CAPS
500.0000 mg | ORAL_CAPSULE | Freq: Once | ORAL | Status: AC
  Administered 2021-02-21: 500 mg via ORAL
  Filled 2021-02-21: qty 1

## 2021-02-21 MED ORDER — EPINEPHRINE 0.3 MG/0.3ML IJ SOAJ
0.3000 mg | Freq: Once | INTRAMUSCULAR | Status: DC | PRN
  Filled 2021-02-21: qty 0.6

## 2021-02-21 MED ORDER — POTASSIUM CHLORIDE CRYS ER 20 MEQ PO TBCR
40.0000 meq | EXTENDED_RELEASE_TABLET | Freq: Once | ORAL | Status: AC
  Administered 2021-02-21: 40 meq via ORAL
  Filled 2021-02-21: qty 2

## 2021-02-21 MED ORDER — DIPHENHYDRAMINE HCL 50 MG/ML IJ SOLN: 25.0000 mg | Freq: Once | INTRAMUSCULAR | Status: DC | PRN

## 2021-02-21 NOTE — Progress Notes (Signed)
Referring Physician(s): Adhikari,A/VanDam,C  Supervising Physician: Ruthann Cancer  Patient Status:  [facility] - In-pt  Chief Complaint:  Back pain with radiation to both lower extremities  Subjective: Patient known to IR service from left lumbar L4-5 facet joint aspiration on 01/18/2021. 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  Blood cultures at that time were positive for MSSA.  Neurosurgery felt at that time patient was not candidate for surgical intervention.  Cardiac imaging showed no evidence of endocarditis.  She was was discharged home on Ancef.  She now presents again to [facility] with nausea /vomiting and persistent back pain with radiation to both lower extremities.  She was found to have a left ureteral stone that was obstructive and underwent cystoscopy with left ureteroscopy and laser lithotripsy and ureteral stent placement on 9/18.  Most recent MRI of the lumbar spine on 1/19 revealed: 1. Severe discitis/osteomyelitis at L2-L3 (detailed above) with enhancing epidural phlegmon spanning from L1-L2 to L4-L5 (up to 1 cm thick ventrally) and into bilateral L2-L3 foramina. When superimposed on degenerative change, resulting severe canal stenosis at L2-L3 and moderate canal stenosis at the L2 and L3 levels. 2. Surrounding paraspinal myositis and phlegmon with 8 mm abscess in the right psoas at the L2-L3 level. 3. Mildly improved but persistent edema and enhancement about the left greater than right L4-L5 facet joints, possibly septic arthritis given the above findings.    She is currently afebrile and remains on IV Ancef.  WBC normal, hemoglobin 11.3, platelets normal, COVID-19 negative.  Request now received from primary care team for image guided aspiration of L2-3 paraspinal/disc space region.    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   GAD (generalized anxiety disorder)    History of kidney stones    History of small bowel obstruction 01/2004   s/p small bowel resection for benzoar/ small bowel stricture   Hyperlipidemia    Hypertension    followed by pcp  (72-01-2020 pt stated never had a stress test)   Insomnia    MDD (major depressive disorder)    Mild persistent asthma    followed by pcp and dr Bernita Raisin (pulmonologist)   RLS (restless legs syndrome)    Vertebral osteomyelitis ([place]) 02/21/2021   Past Surgical History:  Procedure Laterality Date   BUBBLE STUDY  01/25/2021   Procedure: BUBBLE STUDY;  Surgeon: Buford Dresser, MD;  Location: [facility];  Service: Cardiovascular;;   CATARACT EXTRACTION W/ INTRAOCULAR LENS  IMPLANT, BILATERAL  2009; 2010   CYSTOSCOPY/URETEROSCOPY/HOLMIUM LASER/STENT PLACEMENT Right 08/26/2019   Procedure: CYSTOSCOPY/RETROGRADE/URETEROSCOPY/HOLMIUM LASER/STENT PLACEMENT;  Surgeon: Ceasar Mons, MD;  Location: [facility];  Service: Urology;  Laterality: Right;   CYSTOSCOPY/URETEROSCOPY/HOLMIUM LASER/STENT PLACEMENT Left 11/17/2019   Procedure: CYSTOSCOPY/URETEROSCOPY, RETROGRADE,LASER LITHOTRIPSY, STENT PLACEMENT;  Surgeon: Ceasar Mons, MD;  Location: [facility];  Service: Urology;  Laterality: Left;   CYSTOSCOPY/URETEROSCOPY/HOLMIUM LASER/STENT PLACEMENT Left 02/19/2021   Procedure: CYSTOSCOPY/URETEROSCOPY/HOLMIUM LASER/STENT PLACEMENT;  Surgeon: Lucas Mallow, MD;  Location: [facility];  Service: Urology;  Laterality: Left;   EXPLORATORY LAPAROTOMY W/ BOWEL RESECTION  01-15-2004  @[facility]    small bowel resection for stricture/ benzoar   EXTRACORPOREAL SHOCK WAVE LITHOTRIPSY Right 03/27/2018   Procedure: EXTRACORPOREAL SHOCK  WAVE LITHOTRIPSY (ESWL);  Surgeon: Ardis Hughs, MD;  Location: [facility];   Service: Urology;  Laterality: Right;   EXTRACORPOREAL SHOCK WAVE LITHOTRIPSY  x2 2008;  2009;  2015   HOLMIUM LASER APPLICATION Left 11/08/3014   Procedure: HOLMIUM LASER APPLICATION;  Surgeon: Ceasar Mons, MD;  Location: [facility];  Service: Urology;  Laterality: Left;   ORIF ANKLE FRACTURE Right 09/ 2008 @[facility]    per pt has retained hardware   TEE WITHOUT CARDIOVERSION N/A 01/25/2021   Procedure: TRANSESOPHAGEAL ECHOCARDIOGRAM (TEE);  Surgeon: Buford Dresser, MD;  [facility] END[facility]COPY;  Service: Cardiovascular;  Laterality: N/A;   TONSILLECTOMY  age 70     Allergies: Oxycontin [oxycodone], Penicillins, and Tetracyclines & related  Medications: Prior to Admission medications   Medication Sig Start Date End Date Taking? Authorizing Provider  atorvastatin (LIPITOR) 40 MG tablet Take 40 mg by mouth daily.    Yes [provider]  baclofen (LIORESAL) 10 MG tablet Take 10 mg by mouth 3 (three) times daily. 01/24/21  Yes [provider]  BREO ELLIPTA 100-25 MCG/INH AEPB INHALE 1 PUFFS INTO THE LUNGS ONCE DAILY Patient taking differently: Inhale 1 puff into the lungs every morning. 08/04/20  Yes Mannam, Praveen, MD  ceFAZolin (ANCEF) IVPB Inject 2 g into the vein every 8 (eight) hours. Indication:  Osteomyelitis First Dose: No Last Day of Therapy:  03/09/2021 Labs - Once weekly:  CBC/D and BMP, Labs - Every other week:  ESR and CRP Method of administration: IV Push Method of administration may be changed at the discretion of home infusion pharmacist based upon assessment of the patient and/or caregiver's ability to self-administer the medication ordered. 01/27/21 03/10/21 Yes Aline August, MD  celecoxib (CELEBREX) 200 MG capsule Take 400 mg by mouth every morning.   Yes [provider]  cetirizine (ZYRTEC) 10 MG tablet Take 10 mg by mouth every morning.   Yes [provider]  fluticasone (FLONASE) 50 MCG/ACT nasal spray  Place 1 spray into both nostrils 2 (two) times daily.    Yes [provider]  gabapentin (NEURONTIN) 300 MG capsule Take 600 mg by mouth at bedtime.   Yes [provider]  losartan (COZAAR) 50 MG tablet Take 50 mg by mouth daily.  04/25/18  Yes [provider]  methocarbamol (ROBAXIN) 500 MG tablet Take 1 tablet (500 mg total) by mouth every 6 (six) hours as needed for muscle spasms. 01/27/21  Yes Aline August, MD  montelukast (SINGULAIR) 10 MG tablet Take 10 mg by mouth daily.    Yes [provider]  oxyCODONE-acetaminophen (PERCOCET/ROXICET) 5-325 MG tablet Take 1 tablet by mouth every 6 (six) hours as needed for severe pain. 01/27/21  Yes Aline August, MD  promethazine (PHENERGAN) 12.5 MG tablet Take 12.5 mg by mouth every 6 (six) hours as needed for nausea or vomiting.   Yes [provider]  SUMAtriptan (IMITREX) 50 MG tablet Take 50 mg by mouth See admin instructions. Take one tablet (50 mg) by mouth at onset of migraine headache, may repeat 1 dose in an hour. 12/07/20  Yes [provider]  traZODone (DESYREL) 150 MG tablet Take 150 mg by mouth at bedtime.    Yes [provider]  venlafaxine XR (EFFEXOR-XR) 150 MG 24 hr capsule Take 150 mg by mouth every morning.    Yes [provider]  amLODipine (NORVASC) 5 MG tablet Take 5 mg by mouth daily.     [provider]  lidocaine (  LIDODERM) 5 % Place 1 patch onto the skin daily. Remove & Discard patch within 12 hours or as directed by MD Patient not taking: No sig reported 01/27/21   Aline August, MD  oxyCODONE (OXYCONTIN) 15 mg 12 hr tablet Take 1 tablet (15 mg total) by mouth every 12 (twelve) hours. Patient not taking: No sig reported 01/27/21   Aline August, MD  polyethylene glycol (MIRALAX / GLYCOLAX) 17 g packet Take 17 g by mouth daily as needed. Patient not taking: No sig reported 01/27/21   Aline August, MD  senna-docusate (SENOKOT-S) 8.6-50 MG tablet Take 1  tablet by mouth 2 (two) times daily. Patient not taking: No sig reported 01/27/21   Aline August, MD     Vital Signs: BP (!) 158/80 (BP Location: Left Arm)   Pulse 98   Temp 98.7 F (37.1 C) (Oral)   Resp 16   Ht 5' 6"  (1.676 m)   Wt 199 lb 15.3 oz (90.7 kg)   SpO2 98%   BMI 32.27 kg/m   Physical Exam awake, alert.  Chest clear to auscultation bilaterally.  Heart with reg rate and rhythm.  Abdomen soft, positive bowel sounds, nontender.  No lower extremity edema.  Patient with notable mid to lower back pain with radiation to both lower extremities and some occasional paresthesias.  Imaging: MR Lumbar Spine W Wo Contrast  Result Date: 02/20/2021 CLINICAL DATA:  Intervertebral disc disorder EXAM: MRI LUMBAR SPINE WITHOUT AND WITH CONTRAST TECHNIQUE: Multiplanar and multiecho pulse sequences of the lumbar spine were obtained without and with intravenous contrast. CONTRAST:  24m GADAVIST GADOBUTROL 1 MMOL/ML IV SOLN COMPARISON:  MRI lumbar spine 01/17/2021. FINDINGS: Segmentation:  Standard. Alignment:  Similar 6 mm of retrolisthesis of L1 on L2. Vertebrae: New extensive edema within the L2-L3 disc with erosive change of the endplates and extensive marrow edema and enhancement of the L2 and L3 vertebral bodies, compatible with severe discitis/osteomyelitis. Conus medullaris and cauda equina: Conus extends to the L2 level. Conus appears normal. Paraspinal and other soft tissues: At L2-L3, Surrounding edema and enhancement in the paraspinal soft tissues with 8 mm discrete fluid collection in the right psoas, compatible with phlegmon and intramuscular abscess. Mildly improved but persistent edema and enhancement about the left greater than right L4-L5 facet joints. Disc levels: T12-L1: No significant disc protrusion, foraminal stenosis, or canal stenosis. L1-L2: Similar 6 mm of retrolisthesis of L1 on L2. Posterior disc osteophyte complex with bilateral facet arthropathy. Resulting mild-to-moderate  canal stenosis, similar versus slightly progressed. L2-L3: There is enhancement within the ventral epidural canal at L2, L2-L3, and L3 with extension into the foramina, compatible with epidural phlegmon. The enhancing epidural phlegmon measures up to 1 cm at the superior L3 level. Milder enhancement posteriorly. When superimposed on degenerative change, there is resulting severe canal stenosis at L2-L3. Moderate canal stenosis at the adjacent L2 and L3 levels. L3-L4: Posterior disc bulge with small central disc protrusion. Enhancing epidural phlegmon within the ventral canal, measuring up to 1 cm in thickness at the superior L3 level and 6 mm at the mid L3 level. Milder epidural enhancement posteriorly. Resulting moderate canal stenosis. Mild bilateral foraminal stenosis. L4-L5: Grade 1 anterolisthesis of L4 on L5, similar. Broad disc bulge and bilateral facet arthropathy. Similar moderate left mild right foraminal stenosis. No significant canal stenosis. L5-S1: Facet arthropathy and mild disc bulging without significant canal or foraminal stenosis. IMPRESSION: 1. Severe discitis/osteomyelitis at L2-L3 (detailed above) with enhancing epidural phlegmon spanning from L1-L2 to L4-L5 (  up to 1 cm thick ventrally) and into bilateral L2-L3 foramina. When superimposed on degenerative change, resulting severe canal stenosis at L2-L3 and moderate canal stenosis at the L2 and L3 levels. 2. Surrounding paraspinal myositis and phlegmon with 8 mm abscess in the right psoas at the L2-L3 level. 3. Mildly improved but persistent edema and enhancement about the left greater than right L4-L5 facet joints, possibly septic arthritis given the above findings. Electronically Signed   By: Margaretha Sheffield M.D.   On: 02/20/2021 19:11   CT ABDOMEN PELVIS W CONTRAST  Result Date: 02/18/2021 CLINICAL DATA:  Nausea and vomiting EXAM: CT ABDOMEN AND PELVIS WITH CONTRAST TECHNIQUE: Multidetector CT imaging of the abdomen and pelvis was  performed using the standard protocol following bolus administration of intravenous contrast. CONTRAST:  40m OMNIPAQUE IOHEXOL 350 MG/ML SOLN COMPARISON:  None. FINDINGS: Lower chest: Lung bases are clear. Hepatobiliary: No focal hepatic lesion. No biliary duct dilatation. Common bile duct is normal. Pancreas: Pancreas is normal. No ductal dilatation. No pancreatic inflammation. Spleen: Normal spleen Adrenals/urinary tract: Adrenal glands normal. Bilateral nonobstructing renal calculi. There is a 5 mm calculus in the mid LEFT ureter measuring 5 mm (image 52/series 2). This calculus is at the pelvic brim. No bladder calculi. Bilateral homogeneous low-density renal lesions. Stomach/Bowel: Stomach, small bowel, appendix, and cecum are normal. The colon and rectosigmoid colon are normal. Vascular/Lymphatic: Abdominal aorta is normal caliber. No periportal or retroperitoneal adenopathy. No pelvic adenopathy. Reproductive: Uterus and adnexa unremarkable. Other: None Musculoskeletal: Severe arthropathy at L1-L2 and L2-L3 with endplate destruction. Endplate erosion at LQ7-R9is advanced from CT 01/23/2021. Increased paraspinal soft tissue at L2-L3 (image 35/2). Anterolisthesis of L4 on L5 is stable. IMPRESSION: 1. Partially obstructing calculus in the mid LEFT ureter. 2. Bilateral renal calculi. 3. PROGRESSIVE OSTEOMYELITIS with interval destruction of the endplates at LF6-B8compared to CT 01/23/2021. Recommend contrast MRI of the spine for evaluation of extent of disease. Electronically Signed   By: SSuzy BouchardM.D.   On: 02/18/2021 18:54   DG ABD ACUTE 2+V W 1V CHEST  Result Date: 02/18/2021 CLINICAL DATA:  Nausea and vomiting with abdominal pain. EXAM: DG ABDOMEN ACUTE WITH 1 VIEW CHEST COMPARISON:  CT abdomen and pelvis 01/23/2021. chest x-ray 12/16/2020. FINDINGS: Right upper extremity PICC terminates over the mid SVC. There is some linear areas of scarring or atelectasis in the mid and lower lungs. There is  no focal lung consolidation, pleural effusion or pneumothorax identified. Bowel gas pattern is nonobstructive. Bowel anastomosis is seen in the right abdomen. Overall, there is a paucity of bowel gas. Stomach is nondilated. There is no free air under the diaphragm. Bilateral renal calculi are again noted. There is a calcified uterine fibroid in the pelvis, unchanged. Degenerative changes affect the spine and hips. IMPRESSION: 1. Nonobstructive, nonspecific bowel gas pattern. Overall, there is a paucity of bowel gas which can be seen with fluid-filled bowel. Please correlate clinically. 2. Bilateral renal calculi. 3. No acute cardiopulmonary process. Electronically Signed   By: ARonney AstersM.D.   On: 02/18/2021 16:27   DG C-Arm 1-60 Min-No Report  Result Date: 02/19/2021 Fluoroscopy was utilized by the requesting physician.  No radiographic interpretation.    Labs:  CBC: Recent Labs    01/25/21 0447 02/18/21 1428 02/19/21 0318 02/20/21 0242  WBC 9.7 6.8 8.2 8.3  HGB 9.4* 9.2* 11.3* 11.3*  HCT 30.2* 29.3* 35.1* 35.0*  PLT 290 225 271 302    COAGS: No results for input(s): INR,  APTT in the last 8760 hours.  BMP: Recent Labs    02/18/21 1428 02/19/21 0318 02/19/21 1413 02/20/21 0242 02/21/21 0321  NA 137 139  --  136 131*  K 2.5* 2.8* 3.9 3.5 3.1*  CL 103 101  --  101 97*  CO2 23 25  --  25 25  GLUCOSE 165* 151*  --  164* 140*  BUN 8 <5*  --  7* 8  CALCIUM 8.1* 8.5*  --  8.5* 8.0*  CREATININE 0.53 0.38*  --  0.45 0.52  GFRNONAA >60 >60  --  >60 >60    LIVER FUNCTION TESTS: Recent Labs    01/24/21 0410 01/25/21 0447 02/18/21 1428 02/19/21 0318  BILITOT 0.3 0.6 0.8 0.8  AST 22 36 13* 14*  ALT 13 19 5 5   ALKPHOS 92 91 100 108  PROT 7.5 7.3 7.6 8.8*  ALBUMIN 2.3* 2.2* 2.9* 3.4*    Assessment and Plan: Patient known to IR service from left lumbar L4-5 facet joint aspiration on 01/18/2021. She  has a history of osteoarthritis, hypertension, hyperlipidemia, and asthma  and was admitted on 8/16 for progressive acute on chronic lower back pain and found to have L4-5 vertebral osteomyelitis/discitis.  Blood cultures at that time were positive for MSSA.  Neurosurgery felt at that time patient was not candidate for surgical intervention.  Cardiac imaging showed no evidence of endocarditis.  She was was discharged home on Ancef.  She now presents again to [facility] with nausea /vomiting and persistent back pain with radiation to both lower extremities.  She was found to have a left ureteral stone that was obstructive and underwent cystoscopy with left ureteroscopy and laser lithotripsy and ureteral stent placement on 9/18.  Most recent MRI of the lumbar spine on 1/19 revealed: 1. Severe discitis/osteomyelitis at L2-L3 (detailed above) with enhancing epidural phlegmon spanning from L1-L2 to L4-L5 (up to 1 cm thick ventrally) and into bilateral L2-L3 foramina. When superimposed on degenerative change, resulting severe canal stenosis at L2-L3 and moderate canal stenosis at the L2 and L3 levels. 2. Surrounding paraspinal myositis and phlegmon with 8 mm abscess in the right psoas at the L2-L3 level. 3. Mildly improved but persistent edema and enhancement about the left greater than right L4-L5 facet joints, possibly septic arthritis given the above findings.    She is currently afebrile and remains on IV Ancef.  WBC normal, hemoglobin 11.3, platelets normal, COVID-19 negative.  Request now received from primary care team for image guided aspiration of L2-3 paraspinal/disc space region.  Imaging studies were reviewed by Dr. Serafina Royals.  Details/risks of procedure, including but not limited to, internal bleeding, infection, injury to adjacent structures discussed with patient with her understanding and consent.  Procedure tentatively scheduled for 9/21.   Electronically Signed: D. Rowe Robert, PA-C 02/21/2021, 2:37 PM   I spent a total of 20 minutes at the the  patient's bedside AND on the patient's hospital floor or unit, greater han 50% of which was counseling/coordinating care for image guided aspiration of L2-3 paraspinal/disc space region    Patient ID: Haley Singh, female   DOB: Oct 05, 1950, 70 y.o.   MRN: [id number]

## 2021-02-21 NOTE — Progress Notes (Signed)
Noted PICC catheter was 4 cms out but flushes well and with good blood return. Onalee Hua, unit RN was informed and to notify MD that PICC line exchange is recommended if pt is going back to the nursing home. Also, requesting an xray just to check the tip of PICC catheter if PICC exchange is not yet to be done at this time. RN aware.

## 2021-02-21 NOTE — Progress Notes (Signed)
PROGRESS NOTE    Haley Singh  UUV:[id number] DOB: 10-28-1950 DOA: 02/18/2021 PCP: Macy Mis, MD   Chief Complain: Intractable nausea/vomiting  Brief Narrative: Haley Singh is a 70 y.o. 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  On presentation, lab work showed potassium of 2.5.  CT abdomen/pelvis showed 5 mm partially obstructive stone in the left ureter.  Urology consulted and she underwent  cystoscopy with left ureteroscopy/laser lithotripsy/ureteral stent placement on 02/19/21.  MRI of the lumbar spine showed new severe discitis/osteomyelitis of L2-L3.  ID, neurosurgery consulted.  Assessment & Plan:   Principal Problem:   Intractable nausea and vomiting Active Problems:   Mild persistent asthma without complication   Discitis of lumbar region   Hypokalemia   New severe discitis/osteomyelitis of L2-L3/Recent history of MSSA bacteremia/L4-L5 vertebral osteomyelitis/discitis: Hospitalized for this on August this year.  TEE was negative for vegetation.  She is supposed to be on cefazolin 2 g 3 times a day till 10/5 .  She follows with ID as an outpatient .CT abdomen/pelvis done during this hospitalization showed PROGRESSIVE OSTEOMYELITIS with interval destruction of the endplates at L2-L3 compared to CT 01/23/2021.  We ordered MRI of the lumbar spine with contrast for further evaluation and it showed severe discitis/osteomyelitis at L2-L3  with enhancing epidural phlegmon spanning from L1-L2 to L4-L5 (up to 1 cm thick ventrally) and into bilateral L2-L3 foramina resulting severe canal stenosis at L2-L3 and moderate canal stenosis at the L2 and L3 levels.Also showed Surrounding paraspinal myositis and phlegmon with 8 mm abscess inthe right psoas at the L2-L3 level.We consulted   neurosurgery and ID . We will order blood cultures.  We have requested IR for Disc aspiration.  ID recommending to continue same antibiotics.  Intractable nausea/vomiting/loose stools:  Abdominal x-ray did not show any acute findings. CT abdomen/pelvis showed 5 mm partially obstructing stone in the left ureter.Consulted urology, discussed with Dr. Alvester Morin.  Could not find any significant etiology for intractable nausea and vomiting except for ureteral stone.Underwent  cystoscopy with left ureteroscopy/laser lithotripsy/ureteral stent placement.  Nausea/vomiting have resolved   Hypokalemia/hypomagnesemia: Continue to supplement and monitor.   Bilateral renal calculi: As seen on abd xray,chronic findings.5 mm partially obstructing stone in the left ureter.management as above   Mild persistent asthma : Continue bronchodilators as needed.  On Singulair.  Currently not in exacerbation   Severe hypertension: Most likely is due to inability to tolerate oral medication.  Continue PRN medications for severe hypertension.  On amlodipine and losartan at home which we will continued, increased the dose of amlodipine and losartan   History of neuropathy: On gabapentin   History of hyperlipidemia: On statin   History of depression: On Effexor, trazodone   Debility/deconditioning: Recently discharged from skilled nursing facility.  Looks significantly weak, lives alone.  Requested consultation for PT/OT, recommending home health           DVT prophylaxis:Lovenox Code Status: Full Family Communication:None at bedside  Status is: Observation  Dispo: The patient is from: Home              Anticipated d/c is to: Home with home health              Patient currently is not medically stable to d/c.   Difficult to place patient No     Consultants: Urology  Procedures:As above  Antimicrobials:  Anti-infectives (From admission, onward)    Start     Dose/Rate Route Frequency Ordered Stop    02/18/21 1800  ceFAZolin (ANCEF) IVPB 2g/100 mL premix        2 g 200 mL/hr over 30 Minutes Intravenous Every 8 hours 02/18/21 1718         Subjective:  Patient seen and examined the bedside this morning.  Hemodynamically stable, complains of back pain.  No nausea or vomiting  Objective: Vitals:   02/20/21 1401 02/20/21 2240 02/21/21 0543 02/21/21 0748  BP: (!) 160/85 (!) 169/87 (!) 161/97   Pulse: 93 60 91   Resp: 17 16 16    Temp: 98.5 F (36.9 C) 98.2 F (36.8 C) 98.3 F (36.8 C)   TempSrc: Oral Oral Oral   SpO2: 95% 97% 97% 95%  Weight:      Height:        Intake/Output Summary (Last 24 hours) at 02/21/2021 0813 Last data filed at 02/21/2021 0739 Gross per 24 hour  Intake 860 ml  Output 1700 ml  Net -840 ml   Filed Weights   02/18/21 1712  Weight: 90.7 kg    Examination:  General exam: Overall comfortable, not in distress, deconditioned, weak HEENT: PERRL Respiratory system:  no wheezes or crackles  Cardiovascular system: S1 & S2 heard, RRR.  Gastrointestinal system: Abdomen is nondistended, soft and nontender. Central nervous system: Alert and oriented Extremities: No edema, no clubbing ,no cyanosis Skin: No rashes, no ulcers,no icterus     Data Reviewed: I have personally reviewed following labs and imaging studies  CBC: Recent Labs  Lab 02/18/21 1428 02/19/21 0318 02/20/21 0242  WBC 6.8 8.2 8.3  NEUTROABS 5.9  --  6.8  HGB 9.2* 11.3* 11.3*  HCT 29.3* 35.1* 35.0*  MCV 89.1 87.1 86.0  PLT 225 271 302   Basic Metabolic Panel: Recent Labs  Lab 02/18/21 1428 02/19/21 0318 02/19/21 1413 02/20/21 0242 02/21/21 0321  NA 137 139  --  136 131*  K 2.5* 2.8* 3.9 3.5 3.1*  CL 103 101  --  101 97*  CO2 23 25  --  25 25  GLUCOSE 165* 151*  --  164* 140*  BUN 8 <5*  --  7* 8  CREATININE 0.53 0.38*  --  0.45 0.52  CALCIUM 8.1* 8.5*  --  8.5* 8.0*  MG 1.6*  --   --  1.6* 2.1   GFR: Estimated Creatinine Clearance: 72.1 mL/min (by C-G formula based  on SCr of 0.52 mg/dL). Liver Function Tests: Recent Labs  Lab 02/18/21 1428 02/19/21 0318  AST 13* 14*  ALT 5 5  ALKPHOS 100 108  BILITOT 0.8 0.8  PROT 7.6 8.8*  ALBUMIN 2.9* 3.4*   Recent Labs  Lab 02/18/21 1428  LIPASE 27   No results for input(s): AMMONIA in the last 168 hours. Coagulation Profile: No results for input(s): INR, PROTIME in the last 168 hours. Cardiac Enzymes: No results for input(s): CKTOTAL, CKMB, CKMBINDEX, TROPONINI in the last 168 hours. BNP (last 3 results) No results for input(s): PROBNP in the last 8760 hours. HbA1C: No results for input(s): HGBA1C in the last 72 hours. CBG: No results for input(s): GLUCAP in the last 168 hours. Lipid Profile: No results for input(s): CHOL, HDL, LDLCALC, TRIG, CHOLHDL, LDLDIRECT in the last 72 hours. Thyroid Function Tests: No results for input(s): TSH, T4TOTAL, FREET4, T3FREE, THYROIDAB in the last 72 hours. Anemia Panel: No results for input(s):  VITAMINB12, FOLATE, FERRITIN, TIBC, IRON, RETICCTPCT in the last 72 hours. Sepsis Labs: No results for input(s): PROCALCITON, LATICACIDVEN in the last 168 hours.  Recent Results (from the past 240 hour(s))  Resp Panel by RT-PCR (Flu A&B, Covid) Nasopharyngeal Swab     Status: None   Collection Time: 02/18/21  6:15 PM   Specimen: Nasopharyngeal Swab; Nasopharyngeal(NP) swabs in vial transport medium  Result Value Ref Range Status   SARS Coronavirus 2 by RT PCR NEGATIVE NEGATIVE Final    Comment: (NOTE) SARS-CoV-2 target nucleic acids are NOT DETECTED.  The SARS-CoV-2 RNA is generally detectable in upper respiratory specimens during the acute phase of infection. The lowest concentration of SARS-CoV-2 viral copies this assay can detect is 138 copies/mL. A negative result does not preclude SARS-Cov-2 infection and should not be used as the sole basis for treatment or other patient management decisions. A negative result may occur with  improper specimen  collection/handling, submission of specimen other than nasopharyngeal swab, presence of viral mutation(s) within the areas targeted by this assay, and inadequate number of viral copies(<138 copies/mL). A negative result must be combined with clinical observations, patient history, and epidemiological information. The expected result is Negative.  Fact Sheet for Patients:  BloggerCourse.com  Fact Sheet for Healthcare Providers:  SeriousBroker.it  This test is no t yet approved or cleared by the [place] FDA and  has been authorized for detection and/or diagnosis of SARS-CoV-2 by FDA under an Emergency Use Authorization (EUA). This EUA will remain  in effect (meaning this test can be used) for the duration of the COVID-19 declaration under Section 564(b)(1) of the Act, 21 U.S.C.section 360bbb-3(b)(1), unless the authorization is terminated  or revoked sooner.       Influenza A by PCR NEGATIVE NEGATIVE Final   Influenza B by PCR NEGATIVE NEGATIVE Final    Comment: (NOTE) The Xpert Xpress SARS-CoV-2/FLU/RSV plus assay is intended as an aid in the diagnosis of influenza from Nasopharyngeal swab specimens and should not be used as a sole basis for treatment. Nasal washings and aspirates are unacceptable for Xpert Xpress SARS-CoV-2/FLU/RSV testing.  Fact Sheet for Patients: BloggerCourse.com  Fact Sheet for Healthcare Providers: SeriousBroker.it  This test is not yet approved or cleared by the [place] FDA and has been authorized for detection and/or diagnosis of SARS-CoV-2 by FDA under an Emergency Use Authorization (EUA). This EUA will remain in effect (meaning this test can be used) for the duration of the COVID-19 declaration under Section 564(b)(1) of the Act, 21 U.S.C. section 360bbb-3(b)(1), unless the authorization is terminated or revoked.  Performed at [facility], [address] [place], [place] [address]          Radiology Studies: MR Lumbar Spine W Wo Contrast  Result Date: 02/20/2021 CLINICAL DATA:  Intervertebral disc disorder EXAM: MRI LUMBAR SPINE WITHOUT AND WITH CONTRAST TECHNIQUE: Multiplanar and multiecho pulse sequences of the lumbar spine were obtained without and with intravenous contrast. CONTRAST:  65mL GADAVIST GADOBUTROL 1 MMOL/ML IV SOLN COMPARISON:  MRI lumbar spine 01/17/2021. FINDINGS: Segmentation:  Standard. Alignment:  Similar 6 mm of retrolisthesis of L1 on L2. Vertebrae: New extensive edema within the L2-L3 disc with erosive change of the endplates and extensive marrow edema and enhancement of the L2 and L3 vertebral bodies, compatible with severe discitis/osteomyelitis. Conus medullaris and cauda equina: Conus extends to the L2 level. Conus appears normal. Paraspinal and other soft tissues: At L2-L3, Surrounding edema and enhancement in the paraspinal soft  tissues with 8 mm discrete fluid collection in the right psoas, compatible with phlegmon and intramuscular abscess. Mildly improved but persistent edema and enhancement about the left greater than right L4-L5 facet joints. Disc levels: T12-L1: No significant disc protrusion, foraminal stenosis, or canal stenosis. L1-L2: Similar 6 mm of retrolisthesis of L1 on L2. Posterior disc osteophyte complex with bilateral facet arthropathy. Resulting mild-to-moderate canal stenosis, similar versus slightly progressed. L2-L3: There is enhancement within the ventral epidural canal at L2, L2-L3, and L3 with extension into the foramina, compatible with epidural phlegmon. The enhancing epidural phlegmon measures up to 1 cm at the superior L3 level. Milder enhancement posteriorly. When superimposed on degenerative change, there is resulting severe canal stenosis at L2-L3. Moderate canal stenosis at the adjacent L2 and L3 levels. L3-L4: Posterior disc bulge with small central  disc protrusion. Enhancing epidural phlegmon within the ventral canal, measuring up to 1 cm in thickness at the superior L3 level and 6 mm at the mid L3 level. Milder epidural enhancement posteriorly. Resulting moderate canal stenosis. Mild bilateral foraminal stenosis. L4-L5: Grade 1 anterolisthesis of L4 on L5, similar. Broad disc bulge and bilateral facet arthropathy. Similar moderate left mild right foraminal stenosis. No significant canal stenosis. L5-S1: Facet arthropathy and mild disc bulging without significant canal or foraminal stenosis. IMPRESSION: 1. Severe discitis/osteomyelitis at L2-L3 (detailed above) with enhancing epidural phlegmon spanning from L1-L2 to L4-L5 (up to 1 cm thick ventrally) and into bilateral L2-L3 foramina. When superimposed on degenerative change, resulting severe canal stenosis at L2-L3 and moderate canal stenosis at the L2 and L3 levels. 2. Surrounding paraspinal myositis and phlegmon with 8 mm abscess in the right psoas at the L2-L3 level. 3. Mildly improved but persistent edema and enhancement about the left greater than right L4-L5 facet joints, possibly septic arthritis given the above findings. Electronically Signed   By: Feliberto Harts M.D.   On: 02/20/2021 19:11   DG C-Arm 1-60 Min-No Report  Result Date: 02/19/2021 Fluoroscopy was utilized by the requesting physician.  No radiographic interpretation.        Scheduled Meds:  alteplase  2 mg Intracatheter Once   amLODipine  10 mg Oral Daily   atorvastatin  40 mg Oral Daily   Chlorhexidine Gluconate Cloth  6 each Topical Daily   enoxaparin (LOVENOX) injection  40 mg Subcutaneous Q24H   fluticasone  1 spray Each Nare BID   fluticasone furoate-vilanterol  1 puff Inhalation q morning   gabapentin  600 mg Oral QHS   losartan  50 mg Oral Daily   montelukast  10 mg Oral Daily   sodium chloride flush  10-40 mL Intracatheter Q12H   traZODone  150 mg Oral QHS   venlafaxine XR  150 mg Oral q morning    Continuous Infusions:   ceFAZolin (ANCEF) IV 2 g (02/21/21 0112)   ondansetron (ZOFRAN) IV 8 mg (02/18/21 2259)     LOS: 0 days    Time spent: 25 mins.More than 50% of that time was spent in counseling and/or coordination of care.      Burnadette Pop, MD [facility] P9/20/2022, 8:13 AM

## 2021-02-21 NOTE — Consult Note (Addendum)
Date of Admission:  02/18/2021          Reason for Consult:  Radiographic worsening lumbar infection with now discitis and osteomyelitis at L2 and 3 with an epidural phlegmon, abscess from L1-L2 and L4-L5 with severe canal stenosis, new paraspinal myositis and a phlegmon of 8 mm in the right psoas muscle along with continued enhancement around L4-L5 facet joints    Referring Provider: Shelly Coss, MD   Assessment:  Worsening lumbar infection now with discitis at the L2 and L3 level with an epidural phlegmon/abscess from L1-2 to L4-L5 with severe canal stenosis New paraspinal myositis and phlegmon of 8 mm in the right psoas muscle in the area of the L2-L3 Improvement in enhancement in the L4-L5  vertebral osteomyelitis and bilateral facet joint infection Recent MSSA bacteremia which was cause of her spinal pathology Obstructive uropathy status post cystoscopy with ureteroscopy and lithotripsy and placement of stent Remote penicillin Allergy that caused a rash more than 20 years ago Nausea and vomiting Hypertension  Plan:  Continue cefazolin Amoxicillin challenge see if she is still allergic to penicillins Neurosurgical consultation Consider transfer to [facility] for more close monitoring on dedicated neurology floor Check ESR, CRP   Principal Problem:   Epidural abscess Active Problems:   Mild persistent asthma without complication   Discitis of lumbar region   Intractable nausea and vomiting   Hypokalemia   Vertebral osteomyelitis (HCC)   Scheduled Meds:  alteplase  2 mg Intracatheter Once   amLODipine  10 mg Oral Daily   amoxicillin  500 mg Oral Once   atorvastatin  40 mg Oral Daily   Chlorhexidine Gluconate Cloth  6 each Topical Daily   enoxaparin (LOVENOX) injection  40 mg Subcutaneous Q24H   fluticasone  1 spray Each Nare BID   fluticasone furoate-vilanterol  1 puff Inhalation q morning   gabapentin  600 mg Oral QHS   losartan  100 mg Oral Daily    montelukast  10 mg Oral Daily   sodium chloride flush  10-40 mL Intracatheter Q12H   traZODone  150 mg Oral QHS   venlafaxine XR  150 mg Oral q morning   Continuous Infusions:   ceFAZolin (ANCEF) IV 2 g (02/21/21 1035)   PRN Meds:.alum & mag hydroxide-simeth, celecoxib, diphenhydrAMINE, EPINEPHrine, ipratropium-albuterol, labetalol, methocarbamol, ondansetron (ZOFRAN) IV, oxyCODONE-acetaminophen, sodium chloride flush, SUMAtriptan  HPI: Haley Singh is a 70 y.o. female with history of osteoarthritis hypertension hyperlipidemia asthma who was admitted on August 16 for progressive 7-day acute on chronic lower back pain was found to have L4-L5 vertebral osteomyelitis discitis.  Blood cultures were obtained and I are obtained an aspirate from the L4-5 facet joint.  While the IR guided aspirate culture was unrevealing blood cultures were positive for MSSA and were persistently positive over several days before finally clearing.  She underwent 2D echocardiogram and transesophageal echocardiogram that failed to show evidence of endocarditis.  During her hospitalization she was felt to not be a candidate for neurosurgery.  She was discharged on cefazolin with plans to complete antibiotics through [facility] tentatively.  In the interim she was mated to [facility] long hospital with severe nausea and vomiting.  This began on Friday and was intractable through the 17th.  Was found on CT scan to have a left ureteral calculus that was obstructive.  Thought to be suffering from some renal colic and underwent cystoscopy with left ureteroscopy laser lithotripsy and ureteral stent placement.  Nausea and vomiting  have now resolved.  She has had worsening lower back pain in particular if she tries to get out of bed.  She has pain in her lower back that then radiates both down both thighs with numbness.  This is worse than was before.  MRI of the spine was performed yesterday which showed severe destructive  changes at L2-L3 consistent with discitis and vertebral osteomyelitis with an epidural phlegmon, abscess spanning from L1-L2 to L4-L5 with re canal stenosis when superimposed on the degenerative changes she already had stenosis at L2-L3 is most severe , there is some paraspinal myositis and a phlegmon in the L2-L3 level in the right psoas muscle with improved edema enhancement in the L4-L5 vertebral bodies and facet joints.  She is continued on cefazolin in the interim.  Neurosurgery will be consulted.  Would continue on cefazolin at present we will give her an amoxicillin challenge to see if she is truly penicillin allergic nafcillin will be another option though I do not see evidence of extension of infection into her brain or CSF leak.  Regardless she is now going to need more protracted antibiotics with reset of antibiotic clock and repeat MRI prior to stopping antibiotics.  I spent 84  minutes with the patient including than 50% of the time in face to face counseling of the patient re the nature of MSSA bacteremia, diskitis, osteomyelitis, personally reviewing MRI L spine from 9/19 compared to 01/17/2021 along with her TTE, TEE< blood cultures, and  along with review of medical records in preparation for the visit and during the visit and in coordination of her care.    Review of Systems: Review of Systems  Constitutional:  Negative for chills, diaphoresis, fever, malaise/fatigue and weight loss.  HENT:  Negative for congestion, hearing loss, sore throat and tinnitus.   Eyes:  Negative for blurred vision and double vision.  Respiratory:  Negative for cough, sputum production, shortness of breath and wheezing.   Cardiovascular:  Negative for chest pain, palpitations and leg swelling.  Gastrointestinal:  Positive for abdominal pain, nausea and vomiting. Negative for blood in stool, constipation, diarrhea, heartburn and melena.  Genitourinary:  Negative for dysuria, flank pain and hematuria.   Musculoskeletal:  Positive for back pain. Negative for falls, joint pain and myalgias.  Skin:  Negative for itching and rash.  Neurological:  Positive for sensory change, focal weakness and weakness. Negative for dizziness, loss of consciousness and headaches.  Endo/Heme/Allergies:  Does not bruise/bleed easily.  Psychiatric/Behavioral:  Positive for depression. Negative for memory loss and suicidal ideas. The patient is not nervous/anxious.    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   Hyperlipidemia    Hypertension    followed by pcp  (11-10-2019 pt stated never had a stress test)   Insomnia    MDD (major depressive disorder)    Mild persistent asthma    followed by pcp and dr Bernita Raisin (pulmonologist)   RLS (restless legs syndrome)    Vertebral osteomyelitis ([place]) 02/21/2021    Social History   Tobacco Use   Smoking status: Never   Smokeless tobacco: Never  Vaping Use   Vaping Use: Never used  Substance Use Topics  Alcohol use: Yes    Alcohol/week: 7.0 standard drinks    Types: 7 Standard drinks or equivalent per week    Comment: scotch or wine maybe 1 a night   Drug use: Never    Family History  Problem Relation Age of Onset   Breast cancer Maternal Aunt    Heart disease Father    Heart Problems Father    Allergies  Allergen Reactions   Oxycontin [Oxycodone] Itching   Penicillins Other (See Comments)    Unknown reaction Did it involve swelling of the face/tongue/throat, SOB, or low BP? Unknown Did it involve sudden or severe rash/hives, skin peeling, or any reaction on the inside of your mouth or nose? Unknown Did you need to seek medical attention at a hospital or doctor's office? Unknown When did it last  happen?     over 10 years ago  If all above answers are "NO", may proceed with cephalosporin use.    Tetracyclines & Related Itching    OBJECTIVE: Blood pressure (!) 158/80, pulse 98, temperature 98.7 F (37.1 C), temperature source Oral, resp. rate 16, height _0  (1.676 m), weight 90.7 kg, SpO2 98 %.  Physical Exam Constitutional:      General: She is not in acute distress.    Appearance: Normal appearance. She is well-developed. She is not ill-appearing or diaphoretic.  HENT:     Head: Normocephalic and atraumatic.     Right Ear: Hearing and external ear normal.     Left Ear: Hearing and external ear normal.     Nose: No nasal deformity or rhinorrhea.  Eyes:     General: No scleral icterus.    Conjunctiva/sclera: Conjunctivae normal.     Right eye: Right conjunctiva is not injected.     Left eye: Left conjunctiva is not injected.     Pupils: Pupils are equal, round, and reactive to light.  Neck:     Vascular: No JVD.  Cardiovascular:     Rate and Rhythm: Normal rate and regular rhythm.     Heart sounds: S1 normal and S2 normal.  Pulmonary:     Effort: No respiratory distress.     Breath sounds: No wheezing.  Abdominal:     General: There is no distension.     Palpations: Abdomen is soft.     Tenderness: There is no abdominal tenderness.  Musculoskeletal:        General: Normal range of motion.     Right shoulder: Normal.     Left shoulder: Normal.     Cervical back: Normal range of motion and neck supple.     Right hip: Normal.     Left hip: Normal.     Right knee: Normal.     Left knee: Normal.  Lymphadenopathy:     Head:     Right side of head: No submandibular, preauricular or posterior auricular adenopathy.     Left side of head: No submandibular, preauricular or posterior auricular adenopathy.     Cervical: No cervical adenopathy.     Right cervical: No superficial or deep cervical adenopathy.    Left cervical: No superficial or deep cervical adenopathy.   Skin:    General: Skin is warm and dry.     Coloration: Skin is not pale.     Findings: No abrasion, bruising, ecchymosis, erythema, lesion or rash.     Nails: There is no clubbing.  Neurological:     Mental Status: She is alert and oriented to  person, place, and time.  Psychiatric:        Attention and Perception: She is attentive.        Speech: Speech normal.        Behavior: Behavior normal. Behavior is cooperative.        Thought Content: Thought content normal.        Judgment: Judgment normal.   UE strength 5/5  Lower extremity hip flexion in particular hampered by back pain  Lab Results Lab Results  Component Value Date   WBC 8.3 02/20/2021   HGB 11.3 (L) 02/20/2021   HCT 35.0 (L) 02/20/2021   MCV 86.0 02/20/2021   PLT 302 02/20/2021    Lab Results  Component Value Date   CREATININE 0.52 02/21/2021   BUN 8 02/21/2021   NA 131 (L) 02/21/2021   K 3.1 (L) 02/21/2021   CL 97 (L) 02/21/2021   CO2 25 02/21/2021    Lab Results  Component Value Date   ALT 5 02/19/2021   AST 14 (L) 02/19/2021   ALKPHOS 108 02/19/2021   BILITOT 0.8 02/19/2021     Microbiology: Recent Results (from the past 240 hour(s))  Resp Panel by RT-PCR (Flu A&B, Covid) Nasopharyngeal Swab     Status: None   Collection Time: 02/18/21  6:15 PM   Specimen: Nasopharyngeal Swab; Nasopharyngeal(NP) swabs in vial transport medium  Result Value Ref Range Status   SARS Coronavirus 2 by RT PCR NEGATIVE NEGATIVE Final    Comment: (NOTE) SARS-CoV-2 target nucleic acids are NOT DETECTED.  The SARS-CoV-2 RNA is generally detectable in upper respiratory specimens during the acute phase of infection. The lowest concentration of SARS-CoV-2 viral copies this assay can detect is 138 copies/mL. A negative result does not preclude SARS-Cov-2 infection and should not be used as the sole basis for treatment or other patient management decisions. A negative result may occur with  improper specimen  collection/handling, submission of specimen other than nasopharyngeal swab, presence of viral mutation(s) within the areas targeted by this assay, and inadequate number of viral copies(<138 copies/mL). A negative result must be combined with clinical observations, patient history, and epidemiological information. The expected result is Negative.  Fact Sheet for Patients:  EntrepreneurPulse.com.au  Fact Sheet for Healthcare Providers:  IncredibleEmployment.be  This test is no t yet approved or cleared by the [place] FDA and  has been authorized for detection and/or diagnosis of SARS-CoV-2 by FDA under an Emergency Use Authorization (EUA). This EUA will remain  in effect (meaning this test can be used) for the duration of the COVID-19 declaration under Section 564(b)(1) of the Act, 21 U.S.C.section 360bbb-3(b)(1), unless the authorization is terminated  or revoked sooner.       Influenza A by PCR NEGATIVE NEGATIVE Final   Influenza B by PCR NEGATIVE NEGATIVE Final    Comment: (NOTE) The Xpert Xpress SARS-CoV-2/FLU/RSV plus assay is intended as an aid in the diagnosis of influenza from Nasopharyngeal swab specimens and should not be used as a sole basis for treatment. Nasal washings and aspirates are unacceptable for Xpert Xpress SARS-CoV-2/FLU/RSV testing.  Fact Sheet for Patients: EntrepreneurPulse.com.au  Fact Sheet for Healthcare Providers: IncredibleEmployment.be  This test is not yet approved or cleared by the [place] FDA and has been authorized for detection and/or diagnosis of SARS-CoV-2 by FDA under an Emergency Use Authorization (EUA). This EUA will remain in effect (meaning this test can be used) for the duration of the COVID-19 declaration under Section 564(b)(1) of the Act,  21 U.S.C. section 360bbb-3(b)(1), unless the authorization is terminated or revoked.  Performed at [facility], [place] [address], [place], [place] [address]     Alcide Evener, [place] [facility] [phone] pager  02/21/2021, 2:21 PM

## 2021-02-22 ENCOUNTER — Inpatient Hospital Stay (HOSPITAL_COMMUNITY): Payer: Medicare PPO

## 2021-02-22 ENCOUNTER — Inpatient Hospital Stay: Payer: Self-pay

## 2021-02-22 DIAGNOSIS — M4646 Discitis, unspecified, lumbar region: Secondary | ICD-10-CM | POA: Diagnosis not present

## 2021-02-22 DIAGNOSIS — R7881 Bacteremia: Secondary | ICD-10-CM

## 2021-02-22 DIAGNOSIS — R112 Nausea with vomiting, unspecified: Secondary | ICD-10-CM | POA: Diagnosis not present

## 2021-02-22 DIAGNOSIS — M4620 Osteomyelitis of vertebra, site unspecified: Secondary | ICD-10-CM | POA: Diagnosis not present

## 2021-02-22 DIAGNOSIS — B9561 Methicillin susceptible Staphylococcus aureus infection as the cause of diseases classified elsewhere: Secondary | ICD-10-CM

## 2021-02-22 DIAGNOSIS — G062 Extradural and subdural abscess, unspecified: Secondary | ICD-10-CM | POA: Diagnosis not present

## 2021-02-22 DIAGNOSIS — Z880 Allergy status to penicillin: Secondary | ICD-10-CM

## 2021-02-22 HISTORY — PX: IR FLUORO GUIDED NEEDLE PLC ASPIRATION/INJECTION LOC: IMG2395

## 2021-02-22 LAB — CBC WITH DIFFERENTIAL/PLATELET
Abs Immature Granulocytes: 0.06 10*3/uL (ref 0.00–0.07)
Basophils Absolute: 0 10*3/uL (ref 0.0–0.1)
Basophils Relative: 0 %
Eosinophils Absolute: 0 10*3/uL (ref 0.0–0.5)
Eosinophils Relative: 0 %
HCT: 38 % (ref 36.0–46.0)
Hemoglobin: 11.9 g/dL — ABNORMAL LOW (ref 12.0–15.0)
Immature Granulocytes: 1 %
Lymphocytes Relative: 10 %
Lymphs Abs: 1 10*3/uL (ref 0.7–4.0)
MCH: 27.4 pg (ref 26.0–34.0)
MCHC: 31.3 g/dL (ref 30.0–36.0)
MCV: 87.4 fL (ref 80.0–100.0)
Monocytes Absolute: 1 10*3/uL (ref 0.1–1.0)
Monocytes Relative: 10 %
Neutro Abs: 7.9 10*3/uL — ABNORMAL HIGH (ref 1.7–7.7)
Neutrophils Relative %: 79 %
Platelets: 310 10*3/uL (ref 150–400)
RBC: 4.35 MIL/uL (ref 3.87–5.11)
RDW: 15.8 % — ABNORMAL HIGH (ref 11.5–15.5)
WBC: 9.9 10*3/uL (ref 4.0–10.5)
nRBC: 0 % (ref 0.0–0.2)

## 2021-02-22 LAB — BASIC METABOLIC PANEL
Anion gap: 7 (ref 5–15)
BUN: 11 mg/dL (ref 8–23)
CO2: 26 mmol/L (ref 22–32)
Calcium: 8.1 mg/dL — ABNORMAL LOW (ref 8.9–10.3)
Chloride: 98 mmol/L (ref 98–111)
Creatinine, Ser: 0.54 mg/dL (ref 0.44–1.00)
GFR, Estimated: >60 mL/min (ref >60)
Glucose, Bld: 110 mg/dL — ABNORMAL HIGH (ref 70–99)
Potassium: 3.5 mmol/L (ref 3.5–5.1)
Sodium: 131 mmol/L — ABNORMAL LOW (ref 135–145)

## 2021-02-22 LAB — PROTIME-INR
INR: 2.1 — ABNORMAL HIGH (ref 0.8–1.2)
Prothrombin Time: 23.4 seconds — ABNORMAL HIGH (ref 11.4–15.2)

## 2021-02-22 LAB — C-REACTIVE PROTEIN: CRP: 2.3 mg/dL — ABNORMAL HIGH (ref <1.0)

## 2021-02-22 LAB — SEDIMENTATION RATE: Sed Rate: 50 mm/hr — ABNORMAL HIGH (ref 0–22)

## 2021-02-22 MED ORDER — HYDRALAZINE HCL 25 MG PO TABS
25.0000 mg | ORAL_TABLET | Freq: Three times a day (TID) | ORAL | Status: DC
  Administered 2021-02-22 – 2021-02-28 (×16): 25 mg via ORAL
  Filled 2021-02-22 (×17): qty 1

## 2021-02-22 MED ORDER — LIDOCAINE HCL (PF) 1 % IJ SOLN
INTRAMUSCULAR | Status: AC
  Filled 2021-02-22: qty 30

## 2021-02-22 MED ORDER — FENTANYL CITRATE (PF) 100 MCG/2ML IJ SOLN
INTRAMUSCULAR | Status: AC
  Filled 2021-02-22: qty 2

## 2021-02-22 MED ORDER — SODIUM CHLORIDE 0.9% FLUSH
10.0000 mL | Freq: Two times a day (BID) | INTRAVENOUS | Status: DC
  Administered 2021-02-22 – 2021-02-24 (×3): 10 mL

## 2021-02-22 MED ORDER — MIDAZOLAM HCL 2 MG/2ML IJ SOLN
INTRAMUSCULAR | Status: AC
  Filled 2021-02-22: qty 4

## 2021-02-22 MED ORDER — FENTANYL CITRATE (PF) 100 MCG/2ML IJ SOLN
INTRAMUSCULAR | Status: DC | PRN
  Administered 2021-02-22 (×2): 50 ug via INTRAVENOUS

## 2021-02-22 MED ORDER — MIDAZOLAM HCL 2 MG/2ML IJ SOLN
INTRAMUSCULAR | Status: DC | PRN
  Administered 2021-02-22 (×2): 1 mg via INTRAVENOUS

## 2021-02-22 MED ORDER — LABETALOL HCL 5 MG/ML IV SOLN: 10.0000 mg | INTRAVENOUS | Status: DC | PRN

## 2021-02-22 MED ORDER — SODIUM CHLORIDE 0.9% FLUSH: 10.0000 mL | INTRAVENOUS | Status: DC | PRN

## 2021-02-22 MED ORDER — LIDOCAINE HCL (PF) 1 % IJ SOLN
INTRAMUSCULAR | Status: DC | PRN
  Administered 2021-02-22: 10 mL

## 2021-02-22 NOTE — Progress Notes (Signed)
Occupational Therapy Treatment Patient Details Name: Haley Singh MRN: [id number] DOB: 10/27/1950 Today's Date: 02/22/2021   History of present illness Patient is a 70 year old female who presented with intractable nausea and vomitting. patient was found to have left ureteral stone. patient underwent a ureteral stent placement on 9/18. PMH: MDD, OA, chronic back pain, HTN, HLD, asthma, depression, anxiety, insomnia, R ankle fx, septic arthritis of spine   OT comments  Upon arrival to room patient in bathroom. Overall supervision level for safety with toilet transfer, performing hand hygiene and ambulating back to bed with rolling walker. Note patient did not have back brace on "I forgot" and stating minimal pain at this time. Patient set up assist at edge of bed to don clean gown. Declines any further ADLs at this time or getting up to chair, supposed to go for aspiration of abscess today "but I don't know when."    Recommendations for follow up therapy are one component of a multi-disciplinary discharge planning process, led by the attending physician.  Recommendations may be updated based on patient status, additional functional criteria and insurance authorization.    Follow Up Recommendations  Home health OT;Supervision - Intermittent    Equipment Recommendations  None recommended by OT       Precautions / Restrictions Precautions Precautions: Fall;Back Precaution Comments: PICC line Required Braces or Orthoses: Spinal Brace Spinal Brace: Lumbar corset;Applied in sitting position (had prior to this admit, pt applies in sitting) Restrictions Weight Bearing Restrictions: No       Mobility Bed Mobility Overal bed mobility: Needs Assistance Bed Mobility: Sit to Sidelying         Sit to sidelying: Min assist General bed mobility comments: to lift legs back onto bed    Transfers Overall transfer level: Needs assistance Equipment used: Rolling walker (2 wheeled) Transfers:  Sit to/from Stand Sit to Stand: Supervision         General transfer comment: for safety    Balance Overall balance assessment: Needs assistance Sitting-balance support: Feet supported Sitting balance-Leahy Scale: Good     Standing balance support: No upper extremity supported Standing balance-Leahy Scale: Fair Standing balance comment: static standing in bathroom without UE support                           ADL either performed or assessed with clinical judgement   ADL Overall ADL's : Needs assistance/impaired     Grooming: Wash/dry hands;Supervision/safety;Standing           Upper Body Dressing : Set up;Sitting Upper Body Dressing Details (indicate cue type and reason): to don clean gown     Toilet Transfer: Supervision/safety;Ambulation;RW Toilet Transfer Details (indicate cue type and reason): upon arrival patient in bathroom, able to transfer off toilet without assistance and ambulate from bathroom back to bed with walker at supervision level Toileting- Clothing Manipulation and Hygiene: Supervision/safety;Sit to/from stand       Functional mobility during ADLs: Supervision/safety;Rolling walker General ADL Comments: upon arrival patient in bathroom, did not have back brace on. patient stating "I forgot to put it on" but not reporting much pain at this time      Cognition Arousal/Alertness: Awake/alert Behavior During Therapy: Flat affect Overall Cognitive Status: Within Functional Limits for tasks assessed  Pertinent Vitals/ Pain       Pain Assessment: Faces Faces Pain Scale: Hurts a little bit Pain Location: back Pain Descriptors / Indicators: Discomfort Pain Intervention(s): Monitored during session         Frequency  Min 2X/week        Progress Toward Goals  OT Goals(current goals can now be found in the care plan section)  Progress towards OT goals:  Progressing toward goals  Acute Rehab OT Goals Patient Stated Goal: to go home today. OT Goal Formulation: With patient Time For Goal Achievement: 03/06/21 Potential to Achieve Goals: Good ADL Goals Additional ADL Goal #1: Patient will perform 10 min functional activity or exercise activity as evidence of improving activity tolerance Additional ADL Goal #2: Patient to participate in morning ADL routine with MI at [facility] level  Plan Discharge plan needs to be updated       AM-PAC OT "6 Clicks" Daily Activity     Outcome Measure   Help from another person eating meals?: None Help from another person taking care of personal grooming?: None Help from another person toileting, which includes using toliet, bedpan, or urinal?: A Little Help from another person bathing (including washing, rinsing, drying)?: A Little Help from another person to put on and taking off regular upper body clothing?: A Little Help from another person to put on and taking off regular lower body clothing?: A Little 6 Click Score: 20    End of Session Equipment Utilized During Treatment: Rolling walker  OT Visit Diagnosis: Unsteadiness on feet (R26.81);Muscle weakness (generalized) (M62.81);Pain Pain - part of body:  (back)   Activity Tolerance Patient tolerated treatment well   Patient Left in bed;with call bell/phone within reach;with nursing/sitter in room   Nurse Communication Mobility status        Time: 1761-6073 OT Time Calculation (min): 12 min  Charges: OT General Charges $OT Visit: 1 Visit OT Treatments $Self Care/Home Management : 8-22 mins  Marlyce Huge OT OT pager: [phone]   Carmelia Roller 02/22/2021, 11:54 AM

## 2021-02-22 NOTE — Progress Notes (Signed)
Pharmacy note - antibiotic stewardship  Patient was interviewed about her penicillin allergy history.  She reported she had a mild rash as a young adult.  She was interested in testing if this allergy was still present.  We performed an amoxicillin oral challenge on 9/20 and she had no reaction.  Her penicillin allergy was removed from the electronic medical record.  Celedonio Miyamoto, PharmD, BCPS-AQ ID Clinical Pharmacist

## 2021-02-22 NOTE — Progress Notes (Signed)
PROGRESS NOTE    Haley Singh  KHT:[id number] DOB: 08-05-1950 DOA: 02/18/2021 PCP: Macy Mis, MD   Chief Complain: Intractable nausea/vomiting  Brief Narrative: Haley Singh is a 70 y.o. 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  On presentation, lab work showed potassium of 2.5.  CT abdomen/pelvis showed 5 mm partially obstructive stone in the left ureter.  Urology consulted and she underwent  cystoscopy with left ureteroscopy/laser lithotripsy/ureteral stent placement on 02/19/21.  MRI of the lumbar spine showed new severe discitis/osteomyelitis of L2-L3.  ID, neurosurgery consulted.  Neurosurgery not recommending surgical option right away.  IR consulted for disc aspiration.  Assessment & Plan:   Principal Problem:   Epidural abscess Active Problems:   Mild persistent asthma without complication   Discitis of lumbar region   Intractable nausea and vomiting   Hypokalemia   Vertebral osteomyelitis (HCC)   New severe discitis/osteomyelitis of L2-L3/Recent history of MSSA bacteremia/L4-L5 vertebral osteomyelitis/discitis: Hospitalized for this on August this year.  TEE was negative for vegetation.  She is supposed to be on cefazolin 2 g 3 times a day till 10/5 .  She follows with ID as an outpatient .CT abdomen/pelvis done during this hospitalization showed PROGRESSIVE OSTEOMYELITIS with interval destruction of the endplates at L2-L3 compared to CT 01/23/2021.  We ordered MRI of the lumbar spine with contrast for further evaluation and it showed severe discitis/osteomyelitis at L2-L3  with enhancing epidural phlegmon spanning from L1-L2 to L4-L5 (up to 1 cm thick ventrally) and into bilateral L2-L3 foramina resulting severe canal stenosis at L2-L3 and moderate canal stenosis at  the L2 and L3 levels.Also showed Surrounding paraspinal myositis and phlegmon with 8 mm abscess inthe right psoas at the L2-L3 level.We consulted  neurosurgery and ID . We have ordered blood cultures.  We have requested IR for Disc aspiration.  ID recommending to continue same antibiotics for now.  Neurosurgery not recommending surgical option right away.  She complains of persistent back pain but does not have significant focal neurological deficits.  Intractable nausea/vomiting/loose stools:  Abdominal x-ray did not show any acute findings. CT abdomen/pelvis showed 5 mm partially obstructing stone in the left ureter.Consulted urology, discussed with Dr. Alvester Morin.  Could not find any significant etiology for intractable nausea and vomiting except for ureteral stone.Underwent  cystoscopy with left ureteroscopy/laser lithotripsy/ureteral stent placement.  Nausea/vomiting have resolved   Hypokalemia/hypomagnesemia: Continue to monitor and supplement   Bilateral renal calculi: As seen on abd xray,chronic findings.5 mm partially obstructing stone in the left ureter.management as above   Mild persistent asthma : Continue bronchodilators as needed.  On Singulair.  Currently not in exacerbation   Severe hypertension:  Continue PRN medications for severe hypertension.  On amlodipine and losartan at home which we will continued, we increased the dose of amlodipine and losartan.  Added hydralazine today   History of neuropathy: On gabapentin   History of hyperlipidemia: On statin   History of depression: On Effexor, trazodone   Debility/deconditioning: Recently discharged from skilled nursing facility.  Looks significantly weak, lives alone.  Requested consultation for PT/OT, recommending home health           DVT prophylaxis:Lovenox Code Status: Full Family Communication:None at bedside .  Patient does not have any family member Status is: Observation  Dispo: The patient is from: Home  Anticipated d/c is to: Home with home health              Patient currently is not medically stable to d/c.   Difficult to place patient No     Consultants: Urology  Procedures:As above  Antimicrobials:  Anti-infectives (From admission, onward)    Start     Dose/Rate Route Frequency Ordered Stop   02/21/21 1500  amoxicillin (AMOXIL) capsule 500 mg        500 mg Oral  Once 02/21/21 1405 02/21/21 1656   02/18/21 1800  ceFAZolin (ANCEF) IVPB 2g/100 mL premix        2 g 200 mL/hr over 30 Minutes Intravenous Every 8 hours 02/18/21 1718         Subjective:  Patient seen and examined at the bedside this morning.  Blood pressure was noted to be high this morning.  Denies any new complaints ,complains of back pain.  N.p.o. for IR procedure today  Objective: Vitals:   02/21/21 1412 02/21/21 2127 02/22/21 0558 02/22/21 0745  BP: (!) 158/80 (!) 166/95 (!) 182/95   Pulse: 98 100 (!) 103   Resp: 16 17 17    Temp: 98.7 F (37.1 C) 99.4 F (37.4 C) 97.8 F (36.6 C)   TempSrc: Oral     SpO2: 98% 98% 98% 97%  Weight:      Height:        Intake/Output Summary (Last 24 hours) at 02/22/2021 0754 Last data filed at 02/22/2021 0600 Gross per 24 hour  Intake 770 ml  Output 1300 ml  Net -530 ml   Filed Weights   02/18/21 1712  Weight: 90.7 kg    Examination:  General exam: Overall comfortable, not in distress, deconditioned, debilitated HEENT: PERRL Respiratory system:  no wheezes or crackles  Cardiovascular system: S1 & S2 heard, RRR.  Gastrointestinal system: Abdomen is nondistended, soft and nontender. Central nervous system: Alert and oriented Extremities: No edema, no clubbing ,no cyanosis Skin: No rashes, no ulcers,no icterus     Data Reviewed: I have personally reviewed following labs and imaging studies  CBC: Recent Labs  Lab 02/18/21 1428 02/19/21 0318 02/20/21 0242 02/22/21 0430  WBC 6.8 8.2 8.3 9.9  NEUTROABS 5.9  --  6.8 7.9*  HGB 9.2* 11.3* 11.3*  11.9*  HCT 29.3* 35.1* 35.0* 38.0  MCV 89.1 87.1 86.0 87.4  PLT 225 271 302 310   Basic Metabolic Panel: Recent Labs  Lab 02/18/21 1428 02/19/21 0318 02/19/21 1413 02/20/21 0242 02/21/21 0321 02/22/21 0430  NA 137 139  --  136 131* 131*  K 2.5* 2.8* 3.9 3.5 3.1* 3.5  CL 103 101  --  101 97* 98  CO2 23 25  --  25 25 26   GLUCOSE 165* 151*  --  164* 140* 110*  BUN 8 <5*  --  7* 8 11  CREATININE 0.53 0.38*  --  0.45 0.52 0.54  CALCIUM 8.1* 8.5*  --  8.5* 8.0* 8.1*  MG 1.6*  --   --  1.6* 2.1  --    GFR: Estimated Creatinine Clearance: 72.1 mL/min (by C-G formula based on SCr of 0.54 mg/dL). Liver Function Tests: Recent Labs  Lab 02/18/21 1428 02/19/21 0318  AST 13* 14*  ALT 5 5  ALKPHOS 100 108  BILITOT 0.8 0.8  PROT 7.6 8.8*  ALBUMIN 2.9* 3.4*   Recent Labs  Lab 02/18/21 1428  LIPASE 27   No results for input(s): AMMONIA in the last 168 hours.  Coagulation Profile: Recent Labs  Lab 02/22/21 0430  INR 2.1*   Cardiac Enzymes: No results for input(s): CKTOTAL, CKMB, CKMBINDEX, TROPONINI in the last 168 hours. BNP (last 3 results) No results for input(s): PROBNP in the last 8760 hours. HbA1C: No results for input(s): HGBA1C in the last 72 hours. CBG: No results for input(s): GLUCAP in the last 168 hours. Lipid Profile: No results for input(s): CHOL, HDL, LDLCALC, TRIG, CHOLHDL, LDLDIRECT in the last 72 hours. Thyroid Function Tests: No results for input(s): TSH, T4TOTAL, FREET4, T3FREE, THYROIDAB in the last 72 hours. Anemia Panel: No results for input(s): VITAMINB12, FOLATE, FERRITIN, TIBC, IRON, RETICCTPCT in the last 72 hours. Sepsis Labs: No results for input(s): PROCALCITON, LATICACIDVEN in the last 168 hours.  Recent Results (from the past 240 hour(s))  Resp Panel by RT-PCR (Flu A&B, Covid) Nasopharyngeal Swab     Status: None   Collection Time: 02/18/21  6:15 PM   Specimen: Nasopharyngeal Swab; Nasopharyngeal(NP) swabs in vial transport medium   Result Value Ref Range Status   SARS Coronavirus 2 by RT PCR NEGATIVE NEGATIVE Final    Comment: (NOTE) SARS-CoV-2 target nucleic acids are NOT DETECTED.  The SARS-CoV-2 RNA is generally detectable in upper respiratory specimens during the acute phase of infection. The lowest concentration of SARS-CoV-2 viral copies this assay can detect is 138 copies/mL. A negative result does not preclude SARS-Cov-2 infection and should not be used as the sole basis for treatment or other patient management decisions. A negative result may occur with  improper specimen collection/handling, submission of specimen other than nasopharyngeal swab, presence of viral mutation(s) within the areas targeted by this assay, and inadequate number of viral copies(<138 copies/mL). A negative result must be combined with clinical observations, patient history, and epidemiological information. The expected result is Negative.  Fact Sheet for Patients:  BloggerCourse.com  Fact Sheet for Healthcare Providers:  SeriousBroker.it  This test is no t yet approved or cleared by the [place] FDA and  has been authorized for detection and/or diagnosis of SARS-CoV-2 by FDA under an Emergency Use Authorization (EUA). This EUA will remain  in effect (meaning this test can be used) for the duration of the COVID-19 declaration under Section 564(b)(1) of the Act, 21 U.S.C.section 360bbb-3(b)(1), unless the authorization is terminated  or revoked sooner.       Influenza A by PCR NEGATIVE NEGATIVE Final   Influenza B by PCR NEGATIVE NEGATIVE Final    Comment: (NOTE) The Xpert Xpress SARS-CoV-2/FLU/RSV plus assay is intended as an aid in the diagnosis of influenza from Nasopharyngeal swab specimens and should not be used as a sole basis for treatment. Nasal washings and aspirates are unacceptable for Xpert Xpress SARS-CoV-2/FLU/RSV testing.  Fact Sheet for  Patients: BloggerCourse.com  Fact Sheet for Healthcare Providers: SeriousBroker.it  This test is not yet approved or cleared by the [place] FDA and has been authorized for detection and/or diagnosis of SARS-CoV-2 by FDA under an Emergency Use Authorization (EUA). This EUA will remain in effect (meaning this test can be used) for the duration of the COVID-19 declaration under Section 564(b)(1) of the Act, 21 U.S.C. section 360bbb-3(b)(1), unless the authorization is terminated or revoked.  Performed at [facility], [address], [place], [place] [address]          Radiology Studies: MR Lumbar Spine W Wo Contrast  Result Date: 02/20/2021 CLINICAL DATA:  Intervertebral disc disorder EXAM: MRI LUMBAR SPINE WITHOUT AND WITH CONTRAST TECHNIQUE: Multiplanar and  multiecho pulse sequences of the lumbar spine were obtained without and with intravenous contrast. CONTRAST:  22mL GADAVIST GADOBUTROL 1 MMOL/ML IV SOLN COMPARISON:  MRI lumbar spine 01/17/2021. FINDINGS: Segmentation:  Standard. Alignment:  Similar 6 mm of retrolisthesis of L1 on L2. Vertebrae: New extensive edema within the L2-L3 disc with erosive change of the endplates and extensive marrow edema and enhancement of the L2 and L3 vertebral bodies, compatible with severe discitis/osteomyelitis. Conus medullaris and cauda equina: Conus extends to the L2 level. Conus appears normal. Paraspinal and other soft tissues: At L2-L3, Surrounding edema and enhancement in the paraspinal soft tissues with 8 mm discrete fluid collection in the right psoas, compatible with phlegmon and intramuscular abscess. Mildly improved but persistent edema and enhancement about the left greater than right L4-L5 facet joints. Disc levels: T12-L1: No significant disc protrusion, foraminal stenosis, or canal stenosis. L1-L2: Similar 6 mm of retrolisthesis of L1 on L2. Posterior disc  osteophyte complex with bilateral facet arthropathy. Resulting mild-to-moderate canal stenosis, similar versus slightly progressed. L2-L3: There is enhancement within the ventral epidural canal at L2, L2-L3, and L3 with extension into the foramina, compatible with epidural phlegmon. The enhancing epidural phlegmon measures up to 1 cm at the superior L3 level. Milder enhancement posteriorly. When superimposed on degenerative change, there is resulting severe canal stenosis at L2-L3. Moderate canal stenosis at the adjacent L2 and L3 levels. L3-L4: Posterior disc bulge with small central disc protrusion. Enhancing epidural phlegmon within the ventral canal, measuring up to 1 cm in thickness at the superior L3 level and 6 mm at the mid L3 level. Milder epidural enhancement posteriorly. Resulting moderate canal stenosis. Mild bilateral foraminal stenosis. L4-L5: Grade 1 anterolisthesis of L4 on L5, similar. Broad disc bulge and bilateral facet arthropathy. Similar moderate left mild right foraminal stenosis. No significant canal stenosis. L5-S1: Facet arthropathy and mild disc bulging without significant canal or foraminal stenosis. IMPRESSION: 1. Severe discitis/osteomyelitis at L2-L3 (detailed above) with enhancing epidural phlegmon spanning from L1-L2 to L4-L5 (up to 1 cm thick ventrally) and into bilateral L2-L3 foramina. When superimposed on degenerative change, resulting severe canal stenosis at L2-L3 and moderate canal stenosis at the L2 and L3 levels. 2. Surrounding paraspinal myositis and phlegmon with 8 mm abscess in the right psoas at the L2-L3 level. 3. Mildly improved but persistent edema and enhancement about the left greater than right L4-L5 facet joints, possibly septic arthritis given the above findings. Electronically Signed   By: Feliberto Harts M.D.   On: 02/20/2021 19:11        Scheduled Meds:  alteplase  2 mg Intracatheter Once   amLODipine  10 mg Oral Daily   atorvastatin  40 mg Oral  Daily   Chlorhexidine Gluconate Cloth  6 each Topical Daily   enoxaparin (LOVENOX) injection  40 mg Subcutaneous Q24H   fluticasone  1 spray Each Nare BID   fluticasone furoate-vilanterol  1 puff Inhalation q morning   gabapentin  600 mg Oral QHS   losartan  100 mg Oral Daily   montelukast  10 mg Oral Daily   sodium chloride flush  10-40 mL Intracatheter Q12H   traZODone  150 mg Oral QHS   venlafaxine XR  150 mg Oral q morning   Continuous Infusions:   ceFAZolin (ANCEF) IV 2 g (02/22/21 0109)     LOS: 1 day    Time spent: 25 mins.More than 50% of that time was spent in counseling and/or coordination of care.  Burnadette Pop, MD [facility] P9/21/2022, 7:54 AM

## 2021-02-22 NOTE — Progress Notes (Signed)
Peripherally Inserted Central Catheter Placement  The IV Nurse has discussed with the patient and/or persons authorized to consent for the patient, the purpose of this procedure and the potential benefits and risks involved with this procedure.  The benefits include less needle sticks, lab draws from the catheter, and the patient may be discharged home with the catheter. Risks include, but not limited to, infection, bleeding, blood clot (thrombus formation), and puncture of an artery; nerve damage and irregular heartbeat and possibility to perform a PICC exchange if needed/ordered by physician.  Alternatives to this procedure were also discussed.  Bard Power PICC patient education guide, fact sheet on infection prevention and patient information card has been provided to patient /or left at bedside.    PICC Placement Documentation  PICC Single Lumen 02/22/21 Right Brachial 38 cm 0 cm (Active)  Indication for Insertion or Continuance of Line Home intravenous therapies (PICC only) 02/22/21 1136  Exposed Catheter (cm) 0 cm 02/22/21 1136  Site Assessment Clean;Dry;Intact 02/22/21 1136  Line Status Flushed;Saline locked;Blood return noted 02/22/21 1136  Dressing Type Transparent;Securing device 02/22/21 1136  Dressing Status Clean;Dry;Intact 02/22/21 1136  Antimicrobial disc in place? Yes 02/22/21 1136  Safety Lock Not Applicable 02/22/21 1136  Dressing Intervention New dressing;Other (Comment) 02/22/21 1136  Dressing Change Due 03/01/21 02/22/21 1136       Annett Fabian 02/22/2021, 11:38 AM

## 2021-02-22 NOTE — Procedures (Signed)
Interventional Radiology Procedure Note  Procedure: L2-L3 disc aspiratioan  Findings: Please refer to procedural dictation for full description. Approximately 5 mL bloody fluid aspirated.  Complications: None immediate  Estimated Blood Loss: < 5 mL  Recommendations: Follow up cultures.   Marliss Coots, MD Pager: [phone]

## 2021-02-22 NOTE — Progress Notes (Signed)
Subjective: No new complaints, tolerated her amoxicillin challenge without problems   Antibiotics:  Anti-infectives (From admission, onward)    Start     Dose/Rate Route Frequency Ordered Stop   02/21/21 1500  amoxicillin (AMOXIL) capsule 500 mg        500 mg Oral  Once 02/21/21 1405 02/21/21 1656   02/18/21 1800  ceFAZolin (ANCEF) IVPB 2g/100 mL premix        2 g 200 mL/hr over 30 Minutes Intravenous Every 8 hours 02/18/21 1718         Medications: Scheduled Meds:  alteplase  2 mg Intracatheter Once   amLODipine  10 mg Oral Daily   atorvastatin  40 mg Oral Daily   Chlorhexidine Gluconate Cloth  6 each Topical Daily   enoxaparin (LOVENOX) injection  40 mg Subcutaneous Q24H   fluticasone  1 spray Each Nare BID   fluticasone furoate-vilanterol  1 puff Inhalation q morning   gabapentin  600 mg Oral QHS   hydrALAZINE  25 mg Oral Q8H   losartan  100 mg Oral Daily   montelukast  10 mg Oral Daily   sodium chloride flush  10-40 mL Intracatheter Q12H   traZODone  150 mg Oral QHS   venlafaxine XR  150 mg Oral q morning   Continuous Infusions:   ceFAZolin (ANCEF) IV 2 g (02/22/21 0935)   PRN Meds:.alum & mag hydroxide-simeth, celecoxib, diphenhydrAMINE, EPINEPHrine, ipratropium-albuterol, labetalol, methocarbamol, ondansetron (ZOFRAN) IV, oxyCODONE-acetaminophen, sodium chloride flush, SUMAtriptan    Objective: Weight change:   Intake/Output Summary (Last 24 hours) at 02/22/2021 1227 Last data filed at 02/22/2021 0600 Gross per 24 hour  Intake 670 ml  Output 1300 ml  Net -630 ml   Blood pressure (!) 182/95, pulse (!) 103, temperature 97.8 F (36.6 C), resp. rate 17, height 5\' 6"  (1.676 m), weight 90.7 kg, SpO2 97 %. Temp:  [97.8 F (36.6 C)-99.4 F (37.4 C)] 97.8 F (36.6 C) (09/21 0558) Pulse Rate:  [98-103] 103 (09/21 0558) Resp:  [16-17] 17 (09/21 0558) BP: (158-182)/(80-95) 182/95 (09/21 0558) SpO2:  [97 %-98 %] 97 % (09/21 0745)  Physical  Exam: Physical Exam Constitutional:      General: She is not in acute distress.    Appearance: She is well-developed. She is not diaphoretic.  HENT:     Head: Normocephalic and atraumatic.     Right Ear: External ear normal.     Left Ear: External ear normal.     Mouth/Throat:     Pharynx: No oropharyngeal exudate.  Eyes:     General: No scleral icterus.    Conjunctiva/sclera: Conjunctivae normal.     Pupils: Pupils are equal, round, and reactive to light.  Cardiovascular:     Rate and Rhythm: Normal rate and regular rhythm.     Heart sounds: No murmur heard.   No friction rub. No gallop.  Pulmonary:     Effort: Pulmonary effort is normal. No respiratory distress.     Breath sounds: Normal breath sounds. No wheezing or rales.  Abdominal:     General: Bowel sounds are normal. There is no distension.     Palpations: Abdomen is soft.     Tenderness: There is no abdominal tenderness.  Musculoskeletal:        General: No tenderness. Normal range of motion.  Lymphadenopathy:     Cervical: No cervical adenopathy.  Skin:    General: Skin is warm and dry.  Coloration: Skin is not pale.     Findings: No erythema or rash.  Neurological:     Mental Status: She is alert and oriented to person, place, and time.     Motor: No abnormal muscle tone.     Coordination: Coordination normal.  Psychiatric:        Attention and Perception: Attention normal.        Mood and Affect: Mood is elated.        Behavior: Behavior normal.        Thought Content: Thought content normal.        Judgment: Judgment normal.     CBC:    BMET Recent Labs    02/21/21 0321 02/22/21 0430  NA 131* 131*  K 3.1* 3.5  CL 97* 98  CO2 25 26  GLUCOSE 140* 110*  BUN 8 11  CREATININE 0.52 0.54  CALCIUM 8.0* 8.1*     Liver Panel  No results for input(s): PROT, ALBUMIN, AST, ALT, ALKPHOS, BILITOT, BILIDIR, IBILI in the last 72 hours.     Sedimentation Rate Recent Labs    02/22/21 0430   ESRSEDRATE 50*   C-Reactive Protein Recent Labs    02/22/21 0430  CRP 2.3*    Micro Results: Recent Results (from the past 720 hour(s))  Resp Panel by RT-PCR (Flu A&B, Covid) Nasopharyngeal Swab     Status: None   Collection Time: 01/27/21 10:12 AM   Specimen: Nasopharyngeal Swab; Nasopharyngeal(NP) swabs in vial transport medium  Result Value Ref Range Status   SARS Coronavirus 2 by RT PCR NEGATIVE NEGATIVE Final    Comment: (NOTE) SARS-CoV-2 target nucleic acids are NOT DETECTED.  The SARS-CoV-2 RNA is generally detectable in upper respiratory specimens during the acute phase of infection. The lowest concentration of SARS-CoV-2 viral copies this assay can detect is 138 copies/mL. A negative result does not preclude SARS-Cov-2 infection and should not be used as the sole basis for treatment or other patient management decisions. A negative result may occur with  improper specimen collection/handling, submission of specimen other than nasopharyngeal swab, presence of viral mutation(s) within the areas targeted by this assay, and inadequate number of viral copies(<138 copies/mL). A negative result must be combined with clinical observations, patient history, and epidemiological information. The expected result is Negative.  Fact Sheet for Patients:  BloggerCourse.com  Fact Sheet for Healthcare Providers:  SeriousBroker.it  This test is no t yet approved or cleared by the [place] FDA and  has been authorized for detection and/or diagnosis of SARS-CoV-2 by FDA under an Emergency Use Authorization (EUA). This EUA will remain  in effect (meaning this test can be used) for the duration of the COVID-19 declaration under Section 564(b)(1) of the Act, 21 U.S.C.section 360bbb-3(b)(1), unless the authorization is terminated  or revoked sooner.       Influenza A by PCR NEGATIVE NEGATIVE Final   Influenza B by PCR NEGATIVE  NEGATIVE Final    Comment: (NOTE) The Xpert Xpress SARS-CoV-2/FLU/RSV plus assay is intended as an aid in the diagnosis of influenza from Nasopharyngeal swab specimens and should not be used as a sole basis for treatment. Nasal washings and aspirates are unacceptable for Xpert Xpress SARS-CoV-2/FLU/RSV testing.  Fact Sheet for Patients: BloggerCourse.com  Fact Sheet for Healthcare Providers: SeriousBroker.it  This test is not yet approved or cleared by the [place] FDA and has been authorized for detection and/or diagnosis of SARS-CoV-2 by FDA under an Emergency Use Authorization (EUA). This EUA  will remain in effect (meaning this test can be used) for the duration of the COVID-19 declaration under Section 564(b)(1) of the Act, 21 U.S.C. section 360bbb-3(b)(1), unless the authorization is terminated or revoked.  Performed at [facility], [address], [place], [place] [address]   Resp Panel by RT-PCR (Flu A&B, Covid) Nasopharyngeal Swab     Status: None   Collection Time: 02/18/21  6:15 PM   Specimen: Nasopharyngeal Swab; Nasopharyngeal(NP) swabs in vial transport medium  Result Value Ref Range Status   SARS Coronavirus 2 by RT PCR NEGATIVE NEGATIVE Final    Comment: (NOTE) SARS-CoV-2 target nucleic acids are NOT DETECTED.  The SARS-CoV-2 RNA is generally detectable in upper respiratory specimens during the acute phase of infection. The lowest concentration of SARS-CoV-2 viral copies this assay can detect is 138 copies/mL. A negative result does not preclude SARS-Cov-2 infection and should not be used as the sole basis for treatment or other patient management decisions. A negative result may occur with  improper specimen collection/handling, submission of specimen other than nasopharyngeal swab, presence of viral mutation(s) within the areas targeted by this assay, and inadequate number of  viral copies(<138 copies/mL). A negative result must be combined with clinical observations, patient history, and epidemiological information. The expected result is Negative.  Fact Sheet for Patients:  BloggerCourse.com  Fact Sheet for Healthcare Providers:  SeriousBroker.it  This test is no t yet approved or cleared by the [place] FDA and  has been authorized for detection and/or diagnosis of SARS-CoV-2 by FDA under an Emergency Use Authorization (EUA). This EUA will remain  in effect (meaning this test can be used) for the duration of the COVID-19 declaration under Section 564(b)(1) of the Act, 21 U.S.C.section 360bbb-3(b)(1), unless the authorization is terminated  or revoked sooner.       Influenza A by PCR NEGATIVE NEGATIVE Final   Influenza B by PCR NEGATIVE NEGATIVE Final    Comment: (NOTE) The Xpert Xpress SARS-CoV-2/FLU/RSV plus assay is intended as an aid in the diagnosis of influenza from Nasopharyngeal swab specimens and should not be used as a sole basis for treatment. Nasal washings and aspirates are unacceptable for Xpert Xpress SARS-CoV-2/FLU/RSV testing.  Fact Sheet for Patients: BloggerCourse.com  Fact Sheet for Healthcare Providers: SeriousBroker.it  This test is not yet approved or cleared by the [place] FDA and has been authorized for detection and/or diagnosis of SARS-CoV-2 by FDA under an Emergency Use Authorization (EUA). This EUA will remain in effect (meaning this test can be used) for the duration of the COVID-19 declaration under Section 564(b)(1) of the Act, 21 U.S.C. section 360bbb-3(b)(1), unless the authorization is terminated or revoked.  Performed at [facility], [address], [place], [place] [address]     Studies/Results: MR Lumbar Spine W Wo Contrast  Result Date: 02/20/2021 CLINICAL DATA:   Intervertebral disc disorder EXAM: MRI LUMBAR SPINE WITHOUT AND WITH CONTRAST TECHNIQUE: Multiplanar and multiecho pulse sequences of the lumbar spine were obtained without and with intravenous contrast. CONTRAST:  103mL GADAVIST GADOBUTROL 1 MMOL/ML IV SOLN COMPARISON:  MRI lumbar spine 01/17/2021. FINDINGS: Segmentation:  Standard. Alignment:  Similar 6 mm of retrolisthesis of L1 on L2. Vertebrae: New extensive edema within the L2-L3 disc with erosive change of the endplates and extensive marrow edema and enhancement of the L2 and L3 vertebral bodies, compatible with severe discitis/osteomyelitis. Conus medullaris and cauda equina: Conus extends to the L2 level. Conus appears normal. Paraspinal and other soft  tissues: At L2-L3, Surrounding edema and enhancement in the paraspinal soft tissues with 8 mm discrete fluid collection in the right psoas, compatible with phlegmon and intramuscular abscess. Mildly improved but persistent edema and enhancement about the left greater than right L4-L5 facet joints. Disc levels: T12-L1: No significant disc protrusion, foraminal stenosis, or canal stenosis. L1-L2: Similar 6 mm of retrolisthesis of L1 on L2. Posterior disc osteophyte complex with bilateral facet arthropathy. Resulting mild-to-moderate canal stenosis, similar versus slightly progressed. L2-L3: There is enhancement within the ventral epidural canal at L2, L2-L3, and L3 with extension into the foramina, compatible with epidural phlegmon. The enhancing epidural phlegmon measures up to 1 cm at the superior L3 level. Milder enhancement posteriorly. When superimposed on degenerative change, there is resulting severe canal stenosis at L2-L3. Moderate canal stenosis at the adjacent L2 and L3 levels. L3-L4: Posterior disc bulge with small central disc protrusion. Enhancing epidural phlegmon within the ventral canal, measuring up to 1 cm in thickness at the superior L3 level and 6 mm at the mid L3 level. Milder epidural  enhancement posteriorly. Resulting moderate canal stenosis. Mild bilateral foraminal stenosis. L4-L5: Grade 1 anterolisthesis of L4 on L5, similar. Broad disc bulge and bilateral facet arthropathy. Similar moderate left mild right foraminal stenosis. No significant canal stenosis. L5-S1: Facet arthropathy and mild disc bulging without significant canal or foraminal stenosis. IMPRESSION: 1. Severe discitis/osteomyelitis at L2-L3 (detailed above) with enhancing epidural phlegmon spanning from L1-L2 to L4-L5 (up to 1 cm thick ventrally) and into bilateral L2-L3 foramina. When superimposed on degenerative change, resulting severe canal stenosis at L2-L3 and moderate canal stenosis at the L2 and L3 levels. 2. Surrounding paraspinal myositis and phlegmon with 8 mm abscess in the right psoas at the L2-L3 level. 3. Mildly improved but persistent edema and enhancement about the left greater than right L4-L5 facet joints, possibly septic arthritis given the above findings. Electronically Signed   By: Feliberto Harts M.D.   On: 02/20/2021 19:11   [place] EKG SITE RITE  Result Date: 02/22/2021 If Site Rite image not attached, placement could not be confirmed due to current cardiac rhythm.     Assessment/Plan:  INTERVAL HISTORY: IR guided biopsy planned for today and she did well with her amoxicillin challenge   Principal Problem:   Epidural abscess Active Problems:   Mild persistent asthma without complication   Discitis of lumbar region   Intractable nausea and vomiting   Hypokalemia   Vertebral osteomyelitis (HCC)    Haley Singh is a 70 y.o. female with recent admission for MSSA bacteremia with L4-L5 vertebral osteomyelitis and discitis with L4-L5 facet joint infection who has been readmitted for renal colic with obstructive uropathy status post cystoscopy and placement of stent after lithotripsy, with severe ongoing back pain status post MRI that now shows discitis and vertebral osteomyelitis at  L2-L3 with an epidural phlegmon abscess from L1-L2 to L4-5 paraspinal myositis and phlegmon of the psoas muscle at L2-L3 with radiographic improvement of L4-L5 vertebral osteomyelitis.  #1 MSSA bacteremia with severe vertebral osteomyelitis and discitis epidural abscess psoas muscle abscess  Continue cefazolin  Agree that IR guided aspirate is a reasonable way to look for a different pathogen though I suspect this is all MSSA  Really appear shade Dr. Danielle Dess with neurosurgery seeing the patient.  She is not in need of neurosurgery at this point in time.  We will reset the clock above and her antibiotics to this admission and would treat her with 8 weeks  of systemic antibiotics with follow-up including repeat MRI prior to stopping systemic antibiotics.  She will likely need protracted oral antibiotics after she finishes IV therapy  #2 colic with obstructive uropathy status post lithotripsy and cystoscopy with placement of stent with resolution of symptoms of nausea and vomiting.  #3  Sun allergy did well on amoxicillin challenge with no adverse effect   LOS: 1 day   Acey Lav 02/22/2021, 12:27 PM

## 2021-02-22 NOTE — Consult Note (Signed)
Reason for Consult: Progressive osteomyelitis now at L2-3 with epidural phlegmon. Referring Physician: Brystal Kildow is an 70 y.o. female.  HPI: Jenita Rayfield. Reola Calkins is a 70 year old individual who on August 16 was found to have evidence of some osteomyelitis in the facet joint in the lumbar spine.  Needle aspiration was performed and ultimately she was started on some antibiotics for this process.  She notes that her back pain seemed to get better for a week or 2 and then a little over a week ago she started feeling worse.  She was readmitted to the hospital and follow-up studies demonstrate that the patient has discitis and osteomyelitis at the L2-L3 level with evidence of epidural phlegmon and some evidence of extension into the psoas.  Patient has been having significant back pain and she feels that there is some weakness in her legs and that she can get up and walk but only short distances because the back pain becomes very severe.  We are consulted regarding surgical considerations for this individual.  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   Hyperlipidemia    Hypertension    followed by pcp  (11-10-2019 pt stated never had a stress test)   Insomnia    MDD (major depressive disorder)    Mild persistent asthma    followed by pcp and dr Belva Crome (pulmonologist)   RLS (restless legs syndrome)    Vertebral osteomyelitis (HCC) 02/21/2021    Past Surgical History:  Procedure Laterality Date   BUBBLE STUDY  01/25/2021   Procedure: BUBBLE STUDY;  Surgeon: Jodelle Red, MD;  Location: [facility] ENDOSCOPY;  Service: Cardiovascular;;   CATARACT EXTRACTION W/ INTRAOCULAR LENS  IMPLANT, BILATERAL  2009; 2010    CYSTOSCOPY/URETEROSCOPY/HOLMIUM LASER/STENT PLACEMENT Right 08/26/2019   Procedure: CYSTOSCOPY/RETROGRADE/URETEROSCOPY/HOLMIUM LASER/STENT PLACEMENT;  Surgeon: Rene Paci, MD;  Location: [facility];  Service: Urology;  Laterality: Right;   CYSTOSCOPY/URETEROSCOPY/HOLMIUM LASER/STENT PLACEMENT Left 11/17/2019   Procedure: CYSTOSCOPY/URETEROSCOPY, RETROGRADE,LASER LITHOTRIPSY, STENT PLACEMENT;  Surgeon: Rene Paci, MD;  Location: [facility];  Service: Urology;  Laterality: Left;   CYSTOSCOPY/URETEROSCOPY/HOLMIUM LASER/STENT PLACEMENT Left 02/19/2021   Procedure: CYSTOSCOPY/URETEROSCOPY/HOLMIUM LASER/STENT PLACEMENT;  Surgeon: Crista Elliot, MD;  Location: [facility] ORS;  Service: Urology;  Laterality: Left;   EXPLORATORY LAPAROTOMY W/ BOWEL RESECTION  01-15-2004  @[facility]    small bowel resection for stricture/ benzoar   EXTRACORPOREAL SHOCK WAVE LITHOTRIPSY Right 03/27/2018   Procedure: EXTRACORPOREAL SHOCK WAVE LITHOTRIPSY (ESWL);  Surgeon: 03/29/2018, MD;  Location: [facility] ORS;  Service: Urology;  Laterality: Right;   EXTRACORPOREAL SHOCK WAVE LITHOTRIPSY  x2 2008;  2009;  2015   HOLMIUM LASER APPLICATION Left 11/17/2019   Procedure: HOLMIUM LASER APPLICATION;  Surgeon: 11/19/2019, MD;  Location: [facility];  Service: Urology;  Laterality: Left;   ORIF ANKLE FRACTURE Right 09/ 2008 @[facility]    per pt has retained hardware   TEE WITHOUT CARDIOVERSION N/A 01/25/2021   Procedure: TRANSESOPHAGEAL ECHOCARDIOGRAM (TEE);  Surgeon: , MD;  Location: [facility] ENDOSCOPY;  Service: Cardiovascular;  Laterality: N/A;   TONSILLECTOMY  age 19    Family History  Problem Relation Age of Onset  Breast cancer Maternal Aunt    Heart disease Father    Heart Problems Father     Social History:  reports that she has never smoked. She has never used smokeless tobacco. She reports current alcohol use of about 7.0  standard drinks per week. She reports that she does not use drugs.  Allergies:  Allergies  Allergen Reactions   Oxycontin [Oxycodone] Itching   Penicillins Other (See Comments)    Unknown reaction Did it involve swelling of the face/tongue/throat, SOB, or low BP? Unknown Did it involve sudden or severe rash/hives, skin peeling, or any reaction on the inside of your mouth or nose? Unknown Did you need to seek medical attention at a hospital or doctor's office? Unknown When did it last happen?     over 10 years ago  If all above answers are "NO", may proceed with cephalosporin use.    Tetracyclines & Related Itching    Medications: I have reviewed the patient's current medications.  Results for orders placed or performed during the hospital encounter of 02/18/21 (from the past 48 hour(s))  Basic metabolic panel     Status: Abnormal   Collection Time: 02/21/21  3:21 AM  Result Value Ref Range   Sodium 131 (L) 135 - 145 mmol/L   Potassium 3.1 (L) 3.5 - 5.1 mmol/L   Chloride 97 (L) 98 - 111 mmol/L   CO2 25 22 - 32 mmol/L   Glucose, Bld 140 (H) 70 - 99 mg/dL    Comment: Glucose reference range applies only to samples taken after fasting for at least 8 hours.   BUN 8 8 - 23 mg/dL   Creatinine, Ser 3.66 0.44 - 1.00 mg/dL   Calcium 8.0 (L) 8.9 - 10.3 mg/dL   GFR, Estimated >44 >03 mL/min    Comment: (NOTE) Calculated using the CKD-EPI Creatinine Equation (2021)    Anion gap 9 5 - 15    Comment: Performed at [facility], [address] [place], [place] [address]  Magnesium     Status: None   Collection Time: 02/21/21  3:21 AM  Result Value Ref Range   Magnesium 2.1 1.7 - 2.4 mg/dL    Comment: Performed at [facility], [address], [place], [place] [address]  Basic metabolic panel     Status: Abnormal   Collection Time: 02/22/21  4:30 AM  Result Value Ref Range   Sodium 131 (L) 135 - 145 mmol/L   Potassium 3.5 3.5 - 5.1 mmol/L    Chloride 98 98 - 111 mmol/L   CO2 26 22 - 32 mmol/L   Glucose, Bld 110 (H) 70 - 99 mg/dL    Comment: Glucose reference range applies only to samples taken after fasting for at least 8 hours.   BUN 11 8 - 23 mg/dL   Creatinine, Ser 7.56 0.44 - 1.00 mg/dL   Calcium 8.1 (L) 8.9 - 10.3 mg/dL   GFR, Estimated >43 >32 mL/min    Comment: (NOTE) Calculated using the CKD-EPI Creatinine Equation (2021)    Anion gap 7 5 - 15    Comment: Performed at [facility], [address] [place], [place] [address]  Protime-INR     Status: Abnormal   Collection Time: 02/22/21  4:30 AM  Result Value Ref Range   Prothrombin Time 23.4 (H) 11.4 - 15.2 seconds   INR 2.1 (H) 0.8 - 1.2    Comment: (NOTE) INR goal varies based on device and disease  states. Performed at [facility], [address], [place], [place] [address]   CBC with Differential/Platelet     Status: Abnormal   Collection Time: 02/22/21  4:30 AM  Result Value Ref Range   WBC 9.9 4.0 - 10.5 K/uL   RBC 4.35 3.87 - 5.11 MIL/uL   Hemoglobin 11.9 (L) 12.0 - 15.0 g/dL   HCT 42.6 83.4 - 19.6 %   MCV 87.4 80.0 - 100.0 fL   MCH 27.4 26.0 - 34.0 pg   MCHC 31.3 30.0 - 36.0 g/dL   RDW 22.2 (H) 97.9 - 89.2 %   Platelets 310 150 - 400 K/uL   nRBC 0.0 0.0 - 0.2 %   Neutrophils Relative % 79 %   Neutro Abs 7.9 (H) 1.7 - 7.7 K/uL   Lymphocytes Relative 10 %   Lymphs Abs 1.0 0.7 - 4.0 K/uL   Monocytes Relative 10 %   Monocytes Absolute 1.0 0.1 - 1.0 K/uL   Eosinophils Relative 0 %   Eosinophils Absolute 0.0 0.0 - 0.5 K/uL   Basophils Relative 0 %   Basophils Absolute 0.0 0.0 - 0.1 K/uL   Immature Granulocytes 1 %   Abs Immature Granulocytes 0.06 0.00 - 0.07 K/uL    Comment: Performed at [facility], [address] [place], [place] [address]  Sedimentation rate     Status: Abnormal   Collection Time: 02/22/21  4:30 AM  Result Value Ref Range   Sed Rate 50 (H) 0 - 22 mm/hr    Comment:  Performed at [facility], [address] [place], [place] [address]  C-reactive protein     Status: Abnormal   Collection Time: 02/22/21  4:30 AM  Result Value Ref Range   CRP 2.3 (H) <1.0 mg/dL    Comment: Performed at [facility], [address], [place], [place] [address]    MR Lumbar Spine W Wo Contrast  Result Date: 02/20/2021 CLINICAL DATA:  Intervertebral disc disorder EXAM: MRI LUMBAR SPINE WITHOUT AND WITH CONTRAST TECHNIQUE: Multiplanar and multiecho pulse sequences of the lumbar spine were obtained without and with intravenous contrast. CONTRAST:  81mL GADAVIST GADOBUTROL 1 MMOL/ML IV SOLN COMPARISON:  MRI lumbar spine 01/17/2021. FINDINGS: Segmentation:  Standard. Alignment:  Similar 6 mm of retrolisthesis of L1 on L2. Vertebrae: New extensive edema within the L2-L3 disc with erosive change of the endplates and extensive marrow edema and enhancement of the L2 and L3 vertebral bodies, compatible with severe discitis/osteomyelitis. Conus medullaris and cauda equina: Conus extends to the L2 level. Conus appears normal. Paraspinal and other soft tissues: At L2-L3, Surrounding edema and enhancement in the paraspinal soft tissues with 8 mm discrete fluid collection in the right psoas, compatible with phlegmon and intramuscular abscess. Mildly improved but persistent edema and enhancement about the left greater than right L4-L5 facet joints. Disc levels: T12-L1: No significant disc protrusion, foraminal stenosis, or canal stenosis. L1-L2: Similar 6 mm of retrolisthesis of L1 on L2. Posterior disc osteophyte complex with bilateral facet arthropathy. Resulting mild-to-moderate canal stenosis, similar versus slightly progressed. L2-L3: There is enhancement within the ventral epidural canal at L2, L2-L3, and L3 with extension into the foramina, compatible with epidural phlegmon. The enhancing epidural phlegmon measures up to 1 cm at the superior L3 level. Milder  enhancement posteriorly. When superimposed on degenerative change, there is resulting severe canal stenosis at L2-L3. Moderate canal stenosis at the adjacent L2 and L3 levels. L3-L4: Posterior disc bulge with  small central disc protrusion. Enhancing epidural phlegmon within the ventral canal, measuring up to 1 cm in thickness at the superior L3 level and 6 mm at the mid L3 level. Milder epidural enhancement posteriorly. Resulting moderate canal stenosis. Mild bilateral foraminal stenosis. L4-L5: Grade 1 anterolisthesis of L4 on L5, similar. Broad disc bulge and bilateral facet arthropathy. Similar moderate left mild right foraminal stenosis. No significant canal stenosis. L5-S1: Facet arthropathy and mild disc bulging without significant canal or foraminal stenosis. IMPRESSION: 1. Severe discitis/osteomyelitis at L2-L3 (detailed above) with enhancing epidural phlegmon spanning from L1-L2 to L4-L5 (up to 1 cm thick ventrally) and into bilateral L2-L3 foramina. When superimposed on degenerative change, resulting severe canal stenosis at L2-L3 and moderate canal stenosis at the L2 and L3 levels. 2. Surrounding paraspinal myositis and phlegmon with 8 mm abscess in the right psoas at the L2-L3 level. 3. Mildly improved but persistent edema and enhancement about the left greater than right L4-L5 facet joints, possibly septic arthritis given the above findings. Electronically Signed   By: Feliberto Harts M.D.   On: 02/20/2021 19:11    Review of Systems  Constitutional:  Positive for activity change.  HENT: Negative.    Eyes: Negative.   Respiratory: Negative.    Cardiovascular: Negative.   Gastrointestinal: Negative.   Endocrine: Negative.   Genitourinary: Negative.   Musculoskeletal:  Positive for back pain and gait problem.  Allergic/Immunologic: Negative.   Neurological:  Positive for weakness.  Hematological: Negative.   Psychiatric/Behavioral: Negative.    Blood pressure (!) 182/95, pulse (!) 103,  temperature 97.8 F (36.6 C), resp. rate 17, height 5\' 6"  (1.676 m), weight 90.7 kg, SpO2 97 %. Physical Exam Constitutional:      Appearance: Normal appearance. She is normal weight.  HENT:     Head: Normocephalic and atraumatic.     Nose: Nose normal.     Mouth/Throat:     Mouth: Mucous membranes are moist.  Eyes:     Extraocular Movements: Extraocular movements intact.     Pupils: Pupils are equal, round, and reactive to light.  Abdominal:     General: Abdomen is flat.     Palpations: Abdomen is soft.  Neurological:     Mental Status: She is alert.     Comments: Upper extremity strength and reflexes are normal.  Cranial nerve examination appears normal.  In the lower extremities iliopsoas quad strength is 4 out of 5 bilaterally.  This seems to be accentuated by back pain when doing the testing.  Deep tendon reflexes trace in the patellae bilaterally absent in the Achilles bilaterally dorsi and plantar flexor strength appears normal.  Sensation appears intact in the lower extremities all the way up to the trunk to the upper extremities.  Straight leg raising is markedly positive bilaterally.  Psychiatric:        Mood and Affect: Mood normal.        Behavior: Behavior normal.        Thought Content: Thought content normal.        Judgment: Judgment normal.    Assessment/Plan: Discitis and osteomyelitis now at L2-3.  The L4-5 site appears improved.  The patient is on antibiotics.  Concerned that the current process is related to an additional organism would be best addressed with a needle aspiration.  Patient does not have a white count does not appear toxic however back pain is substantial.  Needle aspiration is scheduled for today.  We will follow along to see what  those results hold.  Will hold off on surgical exploration for now.  Shary Key Aniston Christman 02/22/2021, 9:26 AM

## 2021-02-22 NOTE — Progress Notes (Signed)
Physical Therapy Treatment Patient Details Name: Haley Singh MRN: [id number] DOB: 07-Jan-1951 Today's Date: 02/22/2021   History of Present Illness Patient is a 70 year old female who presented with intractable nausea and vomitting. patient was found to have left ureteral stone. patient underwent a ureteral stent placement on 9/18. NS consulted and pt is awaiting needle bx 9/21. No surgical intervention planned at this time PMH: MDD, OA, chronic back pain, HTN, HLD, asthma, depression, anxiety, insomnia, R ankle fx, septic arthritis of spine    PT Comments    Pt agreeable to OOB and amb. Limited by pain and fatigue. Pt prefers to do things her way during this session.  continue to follow in acute setting. Will likely need HHPT at d/c   Recommendations for follow up therapy are one component of a multi-disciplinary discharge planning process, led by the attending physician.  Recommendations may be updated based on patient status, additional functional criteria and insurance authorization.  Follow Up Recommendations  Home health PT;Supervision - Intermittent     Equipment Recommendations  None recommended by PT    Recommendations for Other Services       Precautions / Restrictions Precautions Precautions: Fall;Back Precaution Comments: PICC line Required Braces or Orthoses: Spinal Brace Spinal Brace: Lumbar corset;Applied in sitting position (had prior to this admit, pt applies in sitting) Restrictions Weight Bearing Restrictions: No     Mobility  Bed Mobility Overal bed mobility: Needs Assistance Bed Mobility: Sit to Sidelying;Rolling Rolling: Supervision Sidelying to sit: Supervision Supine to sit: HOB elevated;Min assist   Sit to sidelying: Min assist General bed mobility comments: assist to lift legs back on to bed; pt log rolls to come to sit, too pain ful to reverse on    Transfers Overall transfer level: Needs assistance Equipment used: Rolling walker (2  wheeled) Transfers: Sit to/from Stand Sit to Stand: Supervision         General transfer comment: for safety  Ambulation/Gait Ambulation/Gait assistance: Min guard Gait Distance (Feet): 50 Feet Assistive device: Rolling walker (2 wheeled) Gait Pattern/deviations: Step-through pattern;Decreased stride length;Trunk flexed     General Gait Details: cues for posture, pt reports incr pain and  fatigue with incr distance, mildly unsteady initially however no overt LOB   Stairs             Wheelchair Mobility    Modified Rankin (Stroke Patients Only)       Balance Overall balance assessment: Needs assistance Sitting-balance support: Feet supported Sitting balance-Leahy Scale: Good     Standing balance support: No upper extremity supported Standing balance-Leahy Scale: Fair Standing balance comment: static standing in bathroom without UE support                            Cognition Arousal/Alertness: Awake/alert Behavior During Therapy: Flat affect Overall Cognitive Status: Within Functional Limits for tasks assessed                                        Exercises      General Comments        Pertinent Vitals/Pain Pain Assessment: Faces Faces Pain Scale: Hurts even more Pain Location: back and LLE exacerbated with activity Pain Descriptors / Indicators: Grimacing;Sore;Spasm Pain Intervention(s): Limited activity within patient's tolerance;Monitored during session;Repositioned    Home Living  Prior Function            PT Goals (current goals can now be found in the care plan section) Acute Rehab PT Goals Patient Stated Goal: to go home today. PT Goal Formulation: With patient Time For Goal Achievement: 03/06/21 Potential to Achieve Goals: Good Progress towards PT goals: Progressing toward goals    Frequency    Min 3X/week      PT Plan Current plan remains appropriate     Co-evaluation              AM-PAC PT "6 Clicks" Mobility   Outcome Measure  Help needed turning from your back to your side while in a flat bed without using bedrails?: A Little Help needed moving from lying on your back to sitting on the side of a flat bed without using bedrails?: A Little Help needed moving to and from a bed to a chair (including a wheelchair)?: A Little Help needed standing up from a chair using your arms (e.g., wheelchair or bedside chair)?: A Little Help needed to walk in hospital room?: A Little Help needed climbing 3-5 steps with a railing? : A Little 6 Click Score: 18    End of Session Equipment Utilized During Treatment: Gait belt Activity Tolerance: Patient tolerated treatment well Patient left: with call bell/phone within reach;with bed alarm set;in bed   PT Visit Diagnosis: Other abnormalities of gait and mobility (R26.89)     Time: 8341-9622 PT Time Calculation (min) (ACUTE ONLY): 16 min  Charges:  $Gait Training: 8-22 mins                     Delice Bison, PT  Acute Rehab Dept ([facility]) [phone] Pager [phone]  02/22/2021    Drucilla Chalet 02/22/2021, 1:24 PM

## 2021-02-22 NOTE — Plan of Care (Signed)
  Problem: Activity: Goal: Risk for activity intolerance will decrease Outcome: Progressing   Problem: Nutrition: Goal: Adequate nutrition will be maintained Outcome: Progressing   Problem: Pain Managment: Goal: General experience of comfort will improve Outcome: Progressing   Problem: Safety: Goal: Ability to remain free from injury will improve Outcome: Progressing   

## 2021-02-23 DIAGNOSIS — R112 Nausea with vomiting, unspecified: Secondary | ICD-10-CM | POA: Diagnosis not present

## 2021-02-23 DIAGNOSIS — M4646 Discitis, unspecified, lumbar region: Secondary | ICD-10-CM | POA: Diagnosis not present

## 2021-02-23 DIAGNOSIS — G062 Extradural and subdural abscess, unspecified: Secondary | ICD-10-CM | POA: Diagnosis not present

## 2021-02-23 DIAGNOSIS — J4530 Mild persistent asthma, uncomplicated: Secondary | ICD-10-CM | POA: Diagnosis not present

## 2021-02-23 MED ORDER — MORPHINE SULFATE (PF) 2 MG/ML IV SOLN
2.0000 mg | INTRAVENOUS | Status: DC | PRN
  Administered 2021-02-23 – 2021-02-24 (×2): 2 mg via INTRAVENOUS
  Filled 2021-02-23 (×3): qty 1

## 2021-02-23 MED ORDER — SENNA 8.6 MG PO TABS
1.0000 | ORAL_TABLET | Freq: Two times a day (BID) | ORAL | Status: DC
  Administered 2021-02-23 – 2021-02-28 (×11): 8.6 mg via ORAL
  Filled 2021-02-23 (×11): qty 1

## 2021-02-23 NOTE — Progress Notes (Signed)
PROGRESS NOTE    Haley Singh  EQA:[id number] DOB: 15-Apr-1951 DOA: 02/18/2021 PCP: Macy Mis, MD   Chief Complain: Intractable nausea/vomiting  Brief Narrative: Haley Singh is a 70 y.o. 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  On presentation, lab work showed potassium of 2.5.  CT abdomen/pelvis showed 5 mm partially obstructive stone in the left ureter.  Urology consulted and she underwent  cystoscopy with left ureteroscopy/laser lithotripsy/ureteral stent placement on 02/19/21.  MRI of the lumbar spine showed new severe discitis/osteomyelitis of L2-L3.  ID, neurosurgery consulted.  Neurosurgery not recommending surgical option right away.  Underwent IR guided  disc aspiration on 9.21.22.  Assessment & Plan:   Principal Problem:   Epidural abscess Active Problems:   Mild persistent asthma without complication   Discitis of lumbar region   Intractable nausea and vomiting   Hypokalemia   Vertebral osteomyelitis (HCC)   Penicillin allergy   New severe discitis/osteomyelitis of L2-L3/Recent history of MSSA bacteremia/L4-L5 vertebral osteomyelitis/discitis: Hospitalized for this on August this year.  TEE was negative for vegetation.  She is supposed to be on cefazolin 2 g 3 times a day till 10/5 .  She follows with ID as an outpatient .CT abdomen/pelvis done during this hospitalization showed PROGRESSIVE OSTEOMYELITIS with interval destruction of the endplates at L2-L3 compared to CT 01/23/2021.  We ordered MRI of the lumbar spine with contrast for further evaluation and it showed severe discitis/osteomyelitis at L2-L3  with enhancing epidural phlegmon spanning from L1-L2 to L4-L5 (up to 1 cm thick ventrally) and into bilateral L2-L3 foramina resulting severe canal stenosis at  L2-L3 and moderate canal stenosis at the L2 and L3 levels.Also showed Surrounding paraspinal myositis and phlegmon with 8 mm abscess inthe right psoas at the L2-L3 level.We consulted  neurosurgery and ID . We have ordered blood cultures.  Underwent  Disc aspiration.  Aerobic/anaerobic  culture has not shown any organism.  ID recommending to continue same antibiotics for now.  Neurosurgery not recommending surgical option right away.  She complains of persistent back pain but does not have significant focal neurological deficits.  Intractable nausea/vomiting/loose stools:  Abdominal x-ray did not show any acute findings. CT abdomen/pelvis showed 5 mm partially obstructing stone in the left ureter.Consulted urology, discussed with Dr. Alvester Morin.  Could not find any significant etiology for intractable nausea and vomiting except for ureteral stone.Underwent  cystoscopy with left ureteroscopy/laser lithotripsy/ureteral stent placement.  Nausea/vomiting have resolved   Hypokalemia/hypomagnesemia: Continue to monitor and supplement   Bilateral renal calculi: As seen on abd xray,chronic findings.5 mm partially obstructing stone in the left ureter.management as above   Mild persistent asthma : Continue bronchodilators as needed.  On Singulair.  Currently not in exacerbation   Severe hypertension:  Continue PRN medications for severe hypertension.  On amlodipine and losartan at home which we will continued, we increased the dose of amlodipine and losartan.  Added hydralazine.BP stable now   History of neuropathy: On gabapentin   History of hyperlipidemia: On statin   History of depression: On Effexor, trazodone   Debility/deconditioning: Recently discharged from skilled nursing facility.  Looks significantly weak, lives alone.  Requested consultation for PT/OT, recommending home health           DVT prophylaxis:Lovenox Code Status: Full Family Communication:None at bedside .  Patient does not have  any family  member Status is: Observation  Dispo: The patient is from: Home              Anticipated d/c is to: Home with home health              Patient currently is not medically stable to d/c.   Difficult to place patient No     Consultants: Urology  Procedures:As above  Antimicrobials:  Anti-infectives (From admission, onward)    Start     Dose/Rate Route Frequency Ordered Stop   02/21/21 1500  amoxicillin (AMOXIL) capsule 500 mg        500 mg Oral  Once 02/21/21 1405 02/21/21 1656   02/18/21 1800  ceFAZolin (ANCEF) IVPB 2g/100 mL premix        2 g 200 mL/hr over 30 Minutes Intravenous Every 8 hours 02/18/21 1718         Subjective:  Patient seen and examined the bedside this morning.  Hemodynamically stable.  She was sitting at the edge of the bed and eating her breakfast.  Complains of pain on her bilateral lower extremities.  Complains of back pain.    Objective: Vitals:   02/22/21 2013 02/23/21 0536 02/23/21 0739 02/23/21 0741  BP: 132/80 138/87    Pulse: 64 89    Resp: 17 17    Temp: 97.9 F (36.6 C) 97.8 F (36.6 C)    TempSrc:      SpO2: 98% 97% 97% 97%  Weight:      Height:        Intake/Output Summary (Last 24 hours) at 02/23/2021 0823 Last data filed at 02/23/2021 0600 Gross per 24 hour  Intake 1020 ml  Output 200 ml  Net 820 ml   Filed Weights   02/18/21 1712  Weight: 90.7 kg    Examination:  General exam: Overall comfortable, not in distress, very deconditioned, weak HEENT: PERRL Respiratory system:  no wheezes or crackles  Cardiovascular system: S1 & S2 heard, RRR.  Gastrointestinal system: Abdomen is nondistended, soft and nontender. Central nervous system: Alert and oriented Extremities: No edema, no clubbing ,no cyanosis Skin: No rashes, no ulcers,no icterus     Data Reviewed: I have personally reviewed following labs and imaging studies  CBC: Recent Labs  Lab 02/18/21 1428 02/19/21 0318 02/20/21 0242 02/22/21 0430  WBC  6.8 8.2 8.3 9.9  NEUTROABS 5.9  --  6.8 7.9*  HGB 9.2* 11.3* 11.3* 11.9*  HCT 29.3* 35.1* 35.0* 38.0  MCV 89.1 87.1 86.0 87.4  PLT 225 271 302 310   Basic Metabolic Panel: Recent Labs  Lab 02/18/21 1428 02/19/21 0318 02/19/21 1413 02/20/21 0242 02/21/21 0321 02/22/21 0430  NA 137 139  --  136 131* 131*  K 2.5* 2.8* 3.9 3.5 3.1* 3.5  CL 103 101  --  101 97* 98  CO2 23 25  --  25 25 26   GLUCOSE 165* 151*  --  164* 140* 110*  BUN 8 <5*  --  7* 8 11  CREATININE 0.53 0.38*  --  0.45 0.52 0.54  CALCIUM 8.1* 8.5*  --  8.5* 8.0* 8.1*  MG 1.6*  --   --  1.6* 2.1  --    GFR: Estimated Creatinine Clearance: 72.1 mL/min (by C-G formula based on SCr of 0.54 mg/dL). Liver Function Tests: Recent Labs  Lab 02/18/21 1428 02/19/21 0318  AST 13* 14*  ALT 5 5  ALKPHOS 100 108  BILITOT 0.8 0.8  PROT 7.6 8.8*  ALBUMIN  2.9* 3.4*   Recent Labs  Lab 02/18/21 1428  LIPASE 27   No results for input(s): AMMONIA in the last 168 hours. Coagulation Profile: Recent Labs  Lab 02/22/21 0430  INR 2.1*   Cardiac Enzymes: No results for input(s): CKTOTAL, CKMB, CKMBINDEX, TROPONINI in the last 168 hours. BNP (last 3 results) No results for input(s): PROBNP in the last 8760 hours. HbA1C: No results for input(s): HGBA1C in the last 72 hours. CBG: No results for input(s): GLUCAP in the last 168 hours. Lipid Profile: No results for input(s): CHOL, HDL, LDLCALC, TRIG, CHOLHDL, LDLDIRECT in the last 72 hours. Thyroid Function Tests: No results for input(s): TSH, T4TOTAL, FREET4, T3FREE, THYROIDAB in the last 72 hours. Anemia Panel: No results for input(s): VITAMINB12, FOLATE, FERRITIN, TIBC, IRON, RETICCTPCT in the last 72 hours. Sepsis Labs: No results for input(s): PROCALCITON, LATICACIDVEN in the last 168 hours.  Recent Results (from the past 240 hour(s))  Resp Panel by RT-PCR (Flu A&B, Covid) Nasopharyngeal Swab     Status: None   Collection Time: 02/18/21  6:15 PM   Specimen:  Nasopharyngeal Swab; Nasopharyngeal(NP) swabs in vial transport medium  Result Value Ref Range Status   SARS Coronavirus 2 by RT PCR NEGATIVE NEGATIVE Final    Comment: (NOTE) SARS-CoV-2 target nucleic acids are NOT DETECTED.  The SARS-CoV-2 RNA is generally detectable in upper respiratory specimens during the acute phase of infection. The lowest concentration of SARS-CoV-2 viral copies this assay can detect is 138 copies/mL. A negative result does not preclude SARS-Cov-2 infection and should not be used as the sole basis for treatment or other patient management decisions. A negative result may occur with  improper specimen collection/handling, submission of specimen other than nasopharyngeal swab, presence of viral mutation(s) within the areas targeted by this assay, and inadequate number of viral copies(<138 copies/mL). A negative result must be combined with clinical observations, patient history, and epidemiological information. The expected result is Negative.  Fact Sheet for Patients:  BloggerCourse.com  Fact Sheet for Healthcare Providers:  SeriousBroker.it  This test is no t yet approved or cleared by the [place] FDA and  has been authorized for detection and/or diagnosis of SARS-CoV-2 by FDA under an Emergency Use Authorization (EUA). This EUA will remain  in effect (meaning this test can be used) for the duration of the COVID-19 declaration under Section 564(b)(1) of the Act, 21 U.S.C.section 360bbb-3(b)(1), unless the authorization is terminated  or revoked sooner.       Influenza A by PCR NEGATIVE NEGATIVE Final   Influenza B by PCR NEGATIVE NEGATIVE Final    Comment: (NOTE) The Xpert Xpress SARS-CoV-2/FLU/RSV plus assay is intended as an aid in the diagnosis of influenza from Nasopharyngeal swab specimens and should not be used as a sole basis for treatment. Nasal washings and aspirates are unacceptable for  Xpert Xpress SARS-CoV-2/FLU/RSV testing.  Fact Sheet for Patients: BloggerCourse.com  Fact Sheet for Healthcare Providers: SeriousBroker.it  This test is not yet approved or cleared by the [place] FDA and has been authorized for detection and/or diagnosis of SARS-CoV-2 by FDA under an Emergency Use Authorization (EUA). This EUA will remain in effect (meaning this test can be used) for the duration of the COVID-19 declaration under Section 564(b)(1) of the Act, 21 U.S.C. section 360bbb-3(b)(1), unless the authorization is terminated or revoked.  Performed at [facility], [address], [place], [place] [address]   Aerobic/Anaerobic Culture w Gram Stain (surgical/deep wound)  Status: None (Preliminary result)   Collection Time: 02/22/21  6:00 PM   Specimen: Body Fluid  Result Value Ref Range Status   Specimen Description   Final    FLUID INTERVERTEBRAL DISC Performed at [facility], [address] [place], [place] [address]    Special Requests   Final    NONE Performed at [facility], [address] [place], [place] [address]    Gram Stain   Final    FEW SQUAMOUS EPITHELIAL CELLS PRESENT MODERATE WBC PRESENT, PREDOMINANTLY PMN NO ORGANISMS SEEN Performed at [facility], [address], [place], [place] [address]    Culture PENDING  Incomplete   Report Status PENDING  Incomplete         Radiology Studies: [place] EKG SITE RITE  Result Date: 02/22/2021 If Site Rite image not attached, placement could not be confirmed due to current cardiac rhythm.       Scheduled Meds:  alteplase  2 mg Intracatheter Once   amLODipine  10 mg Oral Daily   atorvastatin  40 mg Oral Daily   Chlorhexidine Gluconate Cloth  6 each Topical Daily   enoxaparin (LOVENOX) injection  40 mg Subcutaneous Q24H   fluticasone  1 spray Each Nare BID   fluticasone  furoate-vilanterol  1 puff Inhalation q morning   gabapentin  600 mg Oral QHS   hydrALAZINE  25 mg Oral Q8H   losartan  100 mg Oral Daily   montelukast  10 mg Oral Daily   sodium chloride flush  10-40 mL Intracatheter Q12H   traZODone  150 mg Oral QHS   venlafaxine XR  150 mg Oral q morning   Continuous Infusions:   ceFAZolin (ANCEF) IV 2 g (02/23/21 0104)     LOS: 2 days    Time spent: 25 mins.More than 50% of that time was spent in counseling and/or coordination of care.      Burnadette Pop, MD [facility] P9/22/2022, 8:23 AM

## 2021-02-23 NOTE — Progress Notes (Addendum)
Subjective: No new complaints back pain may be a little bit better she has been able to sit up to eat breakfast at the bedside   Antibiotics:  Anti-infectives (From admission, onward)    Start     Dose/Rate Route Frequency Ordered Stop   02/21/21 1500  amoxicillin (AMOXIL) capsule 500 mg        500 mg Oral  Once 02/21/21 1405 02/21/21 1656   02/18/21 1800  ceFAZolin (ANCEF) IVPB 2g/100 mL premix        2 g 200 mL/hr over 30 Minutes Intravenous Every 8 hours 02/18/21 1718         Medications: Scheduled Meds:  alteplase  2 mg Intracatheter Once   amLODipine  10 mg Oral Daily   atorvastatin  40 mg Oral Daily   Chlorhexidine Gluconate Cloth  6 each Topical Daily   enoxaparin (LOVENOX) injection  40 mg Subcutaneous Q24H   fluticasone  1 spray Each Nare BID   fluticasone furoate-vilanterol  1 puff Inhalation q morning   gabapentin  600 mg Oral QHS   hydrALAZINE  25 mg Oral Q8H   losartan  100 mg Oral Daily   montelukast  10 mg Oral Daily   senna  1 tablet Oral BID   sodium chloride flush  10-40 mL Intracatheter Q12H   traZODone  150 mg Oral QHS   venlafaxine XR  150 mg Oral q morning   Continuous Infusions:   ceFAZolin (ANCEF) IV 2 g (02/23/21 1046)   PRN Meds:.alum & mag hydroxide-simeth, celecoxib, diphenhydrAMINE, EPINEPHrine, fentaNYL, ipratropium-albuterol, labetalol, lidocaine (PF), methocarbamol, midazolam, ondansetron (ZOFRAN) IV, oxyCODONE-acetaminophen, sodium chloride flush, SUMAtriptan    Objective: Weight change:   Intake/Output Summary (Last 24 hours) at 02/23/2021 1313 Last data filed at 02/23/2021 0839 Gross per 24 hour  Intake 1260 ml  Output 250 ml  Net 1010 ml    Blood pressure 138/87, pulse 89, temperature 97.8 F (36.6 C), resp. rate 17, height _0  (1.676 m), weight 90.7 kg, SpO2 97 %. Temp:  [97.8 F (36.6 C)-98.3 F (36.8 C)] 97.8 F (36.6 C) (09/22 0536) Pulse Rate:  [64-98] 89 (09/22 0536) Resp:  [16-23] 17 (09/22 0536) BP:  (112-138)/(60-87) 138/87 (09/22 0536) SpO2:  [97 %-100 %] 97 % (09/22 0741)  Physical Exam: Physical Exam Constitutional:      General: She is not in acute distress.    Appearance: She is well-developed. She is not diaphoretic.  HENT:     Head: Normocephalic and atraumatic.     Right Ear: External ear normal.     Left Ear: External ear normal.     Mouth/Throat:     Pharynx: No oropharyngeal exudate.  Eyes:     General: No scleral icterus.    Conjunctiva/sclera: Conjunctivae normal.     Pupils: Pupils are equal, round, and reactive to light.  Cardiovascular:     Rate and Rhythm: Normal rate and regular rhythm.     Heart sounds: Normal heart sounds. No murmur heard.   No friction rub. No gallop.  Pulmonary:     Effort: Pulmonary effort is normal. No respiratory distress.     Breath sounds: Normal breath sounds. No wheezing or rales.  Abdominal:     General: Bowel sounds are normal. There is no distension.     Palpations: Abdomen is soft.     Tenderness: There is no abdominal tenderness. There is no rebound.  Musculoskeletal:  General: No tenderness. Normal range of motion.  Lymphadenopathy:     Cervical: No cervical adenopathy.  Skin:    General: Skin is warm and dry.     Coloration: Skin is not pale.     Findings: No erythema or rash.  Neurological:     General: No focal deficit present.     Mental Status: She is alert and oriented to person, place, and time.     Motor: No abnormal muscle tone.  Psychiatric:        Attention and Perception: Attention and perception normal.        Mood and Affect: Mood is anxious.        Behavior: Behavior normal.        Thought Content: Thought content normal.        Judgment: Judgment normal.     CBC:    BMET Recent Labs    02/21/21 0321 02/22/21 0430  NA 131* 131*  K 3.1* 3.5  CL 97* 98  CO2 25 26  GLUCOSE 140* 110*  BUN 8 11  CREATININE 0.52 0.54  CALCIUM 8.0* 8.1*      Liver Panel  No results for  input(s): PROT, ALBUMIN, AST, ALT, ALKPHOS, BILITOT, BILIDIR, IBILI in the last 72 hours.     Sedimentation Rate Recent Labs    02/22/21 0430  ESRSEDRATE 50*    C-Reactive Protein Recent Labs    02/22/21 0430  CRP 2.3*     Micro Results: Recent Results (from the past 720 hour(s))  Resp Panel by RT-PCR (Flu A&B, Covid) Nasopharyngeal Swab     Status: None   Collection Time: 01/27/21 10:12 AM   Specimen: Nasopharyngeal Swab; Nasopharyngeal(NP) swabs in vial transport medium  Result Value Ref Range Status   SARS Coronavirus 2 by RT PCR NEGATIVE NEGATIVE Final    Comment: (NOTE) SARS-CoV-2 target nucleic acids are NOT DETECTED.  The SARS-CoV-2 RNA is generally detectable in upper respiratory specimens during the acute phase of infection. The lowest concentration of SARS-CoV-2 viral copies this assay can detect is 138 copies/mL. A negative result does not preclude SARS-Cov-2 infection and should not be used as the sole basis for treatment or other patient management decisions. A negative result may occur with  improper specimen collection/handling, submission of specimen other than nasopharyngeal swab, presence of viral mutation(s) within the areas targeted by this assay, and inadequate number of viral copies(<138 copies/mL). A negative result must be combined with clinical observations, patient history, and epidemiological information. The expected result is Negative.  Fact Sheet for Patients:  EntrepreneurPulse.com.au  Fact Sheet for Healthcare Providers:  IncredibleEmployment.be  This test is no t yet approved or cleared by the [place] FDA and  has been authorized for detection and/or diagnosis of SARS-CoV-2 by FDA under an Emergency Use Authorization (EUA). This EUA will remain  in effect (meaning this test can be used) for the duration of the COVID-19 declaration under Section 564(b)(1) of the Act, 21 U.S.C.section  360bbb-3(b)(1), unless the authorization is terminated  or revoked sooner.       Influenza A by PCR NEGATIVE NEGATIVE Final   Influenza B by PCR NEGATIVE NEGATIVE Final    Comment: (NOTE) The Xpert Xpress SARS-CoV-2/FLU/RSV plus assay is intended as an aid in the diagnosis of influenza from Nasopharyngeal swab specimens and should not be used as a sole basis for treatment. Nasal washings and aspirates are unacceptable for Xpert Xpress SARS-CoV-2/FLU/RSV testing.  Fact Sheet for Patients: EntrepreneurPulse.com.au  Fact  Sheet for Healthcare Providers: IncredibleEmployment.be  This test is not yet approved or cleared by the [place] and has been authorized for detection and/or diagnosis of SARS-CoV-2 by FDA under an Emergency Use Authorization (EUA). This EUA will remain in effect (meaning this test can be used) for the duration of the COVID-19 declaration under Section 564(b)(1) of the Act, 21 U.S.C. section 360bbb-3(b)(1), unless the authorization is terminated or revoked.  Performed at [facility], [place] [address] [place], [place] [address]   Resp Panel by RT-PCR (Flu A&B, Covid) Nasopharyngeal Swab     Status: None   Collection Time: 02/18/21  6:15 PM   Specimen: Nasopharyngeal Swab; Nasopharyngeal(NP) swabs in vial transport medium  Result Value Ref Range Status   SARS Coronavirus 2 by RT PCR NEGATIVE NEGATIVE Final    Comment: (NOTE) SARS-CoV-2 target nucleic acids are NOT DETECTED.  The SARS-CoV-2 RNA is generally detectable in upper respiratory specimens during the acute phase of infection. The lowest concentration of SARS-CoV-2 viral copies this assay can detect is 138 copies/mL. A negative result does not preclude SARS-Cov-2 infection and should not be used as the sole basis for treatment or other patient management decisions. A negative result may occur with  improper specimen collection/handling,  submission of specimen other than nasopharyngeal swab, presence of viral mutation(s) within the areas targeted by this assay, and inadequate number of viral copies(<138 copies/mL). A negative result must be combined with clinical observations, patient history, and epidemiological information. The expected result is Negative.  Fact Sheet for Patients:  EntrepreneurPulse.com.au  Fact Sheet for Healthcare Providers:  IncredibleEmployment.be  This test is no t yet approved or cleared by the [place] FDA and  has been authorized for detection and/or diagnosis of SARS-CoV-2 by FDA under an Emergency Use Authorization (EUA). This EUA will remain  in effect (meaning this test can be used) for the duration of the COVID-19 declaration under Section 564(b)(1) of the Act, 21 U.S.C.section 360bbb-3(b)(1), unless the authorization is terminated  or revoked sooner.       Influenza A by PCR NEGATIVE NEGATIVE Final   Influenza B by PCR NEGATIVE NEGATIVE Final    Comment: (NOTE) The Xpert Xpress SARS-CoV-2/FLU/RSV plus assay is intended as an aid in the diagnosis of influenza from Nasopharyngeal swab specimens and should not be used as a sole basis for treatment. Nasal washings and aspirates are unacceptable for Xpert Xpress SARS-CoV-2/FLU/RSV testing.  Fact Sheet for Patients: EntrepreneurPulse.com.au  Fact Sheet for Healthcare Providers: IncredibleEmployment.be  This test is not yet approved or cleared by the [place] FDA and has been authorized for detection and/or diagnosis of SARS-CoV-2 by FDA under an Emergency Use Authorization (EUA). This EUA will remain in effect (meaning this test can be used) for the duration of the COVID-19 declaration under Section 564(b)(1) of the Act, 21 U.S.C. section 360bbb-3(b)(1), unless the authorization is terminated or revoked.  Performed at [facility], [place] [address], [place], [place] [address]   Culture, blood (routine x 2)     Status: None (Preliminary result)   Collection Time: 02/22/21 12:28 PM   Specimen: BLOOD  Result Value Ref Range Status   Specimen Description   Final    BLOOD BLOOD LEFT HAND Performed at [facility] [address], [place], [place] [address]    Special Requests   Final    BOTTLES DRAWN AEROBIC AND ANAEROBIC Blood Culture adequate volume Performed at [facility], [address] [place], [place] [address]    Culture   Final    NO GROWTH < 24 HOURS Performed at [facility] [address], [place], [place] [address]    Report Status PENDING  Incomplete  Culture, blood (routine x 2)     Status: None (Preliminary result)   Collection Time: 02/22/21 12:28 PM   Specimen: BLOOD  Result Value Ref Range Status   Specimen Description   Final    BLOOD LEFT ANTECUBITAL Performed at [facility] [address] [place], [place] [address]    Special Requests   Final    BOTTLES DRAWN AEROBIC AND ANAEROBIC Blood Culture adequate volume Performed at [facility] [address] [place], [place] [address]    Culture   Final    NO GROWTH < 24 HOURS Performed at [facility] [address], [place], [place] [address]    Report Status PENDING  Incomplete  Aerobic/Anaerobic Culture w Gram Stain (surgical/deep wound)     Status: None (Preliminary result)   Collection Time: 02/22/21  6:00 PM   Specimen: Body Fluid  Result Value Ref Range Status   Specimen Description   Final    FLUID INTERVERTEBRAL DISC Performed at [facility] [address], [place], [place] [address]    Special Requests   Final    NONE Performed at [facility], [place] [address], [place], [place] [address]    Gram Stain   Final    FEW SQUAMOUS EPITHELIAL CELLS PRESENT MODERATE WBC PRESENT,  PREDOMINANTLY PMN NO ORGANISMS SEEN Performed at [facility], [place] [address], [place], [place] [address]    Culture PENDING  Incomplete   Report Status PENDING  Incomplete    Studies/Results: [place] EKG SITE RITE  Result Date: 02/22/2021 If Site Rite image not attached, placement could not be confirmed due to current cardiac rhythm.     Assessment/Plan:  INTERVAL HISTORY: cultures from IR guided biopsy are unrevealing   Principal Problem:   Epidural abscess Active Problems:   Mild persistent asthma without complication   Discitis of lumbar region   Intractable nausea and vomiting   Hypokalemia   Vertebral osteomyelitis (HCC)   Penicillin allergy    Haley Singh is a 70 y.o. female with recent admission for MSSA bacteremia with L4-L5 vertebral osteomyelitis and discitis with L4-L5 facet joint infection who has been readmitted for renal colic with obstructive uropathy status post cystoscopy and placement of stent after lithotripsy, with severe ongoing back pain status post MRI that now shows discitis and vertebral osteomyelitis at L2-L3 with an epidural phlegmon abscess from L1-L2 to L4-5 paraspinal myositis and phlegmon of the psoas muscle at L2-L3 with radiographic improvement of L4-L5 vertebral osteomyelitis.  #1 MSSA bacteremia vertebral osteomyelitis discitis epidural abscess paraspinal myositis and abscess and psoas muscle  We will follow-up IR guided aspirate but I doubt we will find a new organism.  We are resetting the clot, and cefazolin and will start her on 6 weeks from yesterday with potential to extend this further.  Would want to make sure that we reimage her prior to stopping antibiotics though perhaps some of her antibiotics to be given in the form of pills after she finishes 6 weeks of parenteral therapy  #2 renal colic with obstructive uropathy this is resolved after cystoscopy, lithotripsy and placement of stents  #3 penicillin allergy she is no  longer allergic to penicillins  as she did well on her amoxicillin challenge.  Diagnosis: MSSA bacteremia and vertebral infection  Culture Result: MSSA  Allergies  Allergen Reactions   Oxycontin [Oxycodone] Itching   Tetracyclines & Related Itching    OPAT Orders Discharge antibiotics:  Cefazolin  Duration:  6 weeks End Date:  April 05, 2021  [facility] Care Per Protocol:   Labs  weekly while on IV antibiotics: _x_ CBC with differential _x_ BMP w GFR/CMP _x_ CRP __x ESR    _x_ Please pull PIC at completion of IV antibiotics __ Please leave PIC in place until doctor has seen patient or been notified  Fax weekly labs to [phone]  Clinic Follow Up Appt:   DAVIANA HAYMAKER has an appointment on 03/10/2021 at 6 AM with Dr. Gale Journey  The [facility] for Infectious Disease is located in the [facility] at  [place] in [place].  [address], which is located to the left of the elevators.  Phone: [phone]  Fax: [phone]  [email or web address]   She should arrive 15 to 30 minutes prior to her appointment.    LOS: 2 days   Alcide Evener 02/23/2021, 1:13 PM

## 2021-02-23 NOTE — Progress Notes (Signed)
PHARMACY CONSULT NOTE FOR:  OUTPATIENT  PARENTERAL ANTIBIOTIC THERAPY (OPAT)  Indication: vertebral osteomyelitis Regimen: cefazolin 2g IV q8h End date: 04/05/21  IV antibiotic discharge orders are pended. To discharging provider:  please sign these orders via discharge navigator,  Select New Orders & click on the button choice - Manage This Unsigned Work.     Thank you for allowing pharmacy to be a part of this patient's care.  Haley Singh 02/23/2021, 11:40 AM

## 2021-02-24 DIAGNOSIS — R112 Nausea with vomiting, unspecified: Secondary | ICD-10-CM | POA: Diagnosis not present

## 2021-02-24 MED ORDER — BOOST / RESOURCE BREEZE PO LIQD CUSTOM
1.0000 | Freq: Two times a day (BID) | ORAL | Status: DC
  Administered 2021-02-24 – 2021-02-27 (×6): 1 via ORAL

## 2021-02-24 MED ORDER — ADULT MULTIVITAMIN W/MINERALS CH
1.0000 | ORAL_TABLET | Freq: Every day | ORAL | Status: DC
  Administered 2021-02-24 – 2021-02-28 (×5): 1 via ORAL
  Filled 2021-02-24 (×5): qty 1

## 2021-02-24 NOTE — TOC Initial Note (Addendum)
Transition of Care [facility]) - Initial/Assessment Note    Patient Details  Name: Haley Singh MRN: [id number] Date of Birth: 07-11-1950  Transition of Care [facility]) CM/SW Contact:    Lennart Pall, LCSW Phone Number: 02/24/2021, 1:17 PM  Clinical Narrative:                 Met with pt today to begin dc planning.  This morning, pt was planning to dc home and had noted she had a private duty aide arranged for home support.  Of note, she had only been home two days from [facility] when she was admitted for this stay and she is very frustrated.  Following up with her this afternoon, she now reports that she does not feel dc home is the safest plan for her and that she should go back to SNF.  She is feeling much weaker than she did PTA and feels she needs rehab again. Have alerted MD/RN/PT and will begin SNF placement process as PT notes she did require mod assist today.  Addendum:  Pt has accepted SNF bed at [facility] who can admit pt on Monday.  Will begin insurance authorization. Will need COVID test ordered on Sunday.  Expected Discharge Plan: Skilled Nursing Facility Barriers to Discharge: Continued Medical Work up   Patient Goals and CMS Choice Patient states their goals for this hospitalization and ongoing recovery are:: to go home but now feels she needs SNF again for rehab      Expected Discharge Plan and Services Expected Discharge Plan: [place]                                              Prior Living Arrangements/Services   Lives with:: Self Patient language and need for interpreter reviewed:: Yes Do you feel safe going back to the place where you live?: Yes        Care giver support system in place?: No (comment)   Criminal Activity/Legal Involvement Pertinent to Current Situation/Hospitalization: No - Comment as needed  Activities of Daily Living Home Assistive Devices/Equipment: Gilford Rile (specify type) ADL Screening (condition at time  of admission) Patient's cognitive ability adequate to safely complete daily activities?: Yes Is the patient deaf or have difficulty hearing?: Yes Does the patient have difficulty seeing, even when wearing glasses/contacts?: No Does the patient have difficulty concentrating, remembering, or making decisions?: No Patient able to express need for assistance with ADLs?: Yes Does the patient have difficulty dressing or bathing?: Yes Independently performs ADLs?: No Communication: Independent Dressing (OT): Needs assistance Is this a change from baseline?: Pre-admission baseline Grooming: Independent Feeding: Independent Bathing: Needs assistance Is this a change from baseline?: Pre-admission baseline Toileting: Needs assistance Is this a change from baseline?: Pre-admission baseline In/Out Bed: Needs assistance Is this a change from baseline?: Pre-admission baseline Walks in Home: Independent with device (comment) Does the patient have difficulty walking or climbing stairs?: Yes Weakness of Legs: Both Weakness of Arms/Hands: Both  Permission Sought/Granted Permission sought to share information with : Facility Sport and exercise psychologist                Emotional Assessment Appearance:: Appears stated age Attitude/Demeanor/Rapport: Lethargic, Gracious Affect (typically observed): Accepting, Frustrated Orientation: : Oriented to Self, Oriented to Place, Oriented to  Time, Oriented to Situation Alcohol / Substance Use: Not Applicable Psych Involvement: No (comment)  Admission diagnosis:  Hypokalemia [E87.6] Intractable nausea and vomiting [R11.2] Patient Active Problem List   Diagnosis Date Noted   Penicillin allergy    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   Vertebral osteomyelitis ([place]) 02/21/2021   Intractable nausea and vomiting 02/18/2021   Hypokalemia 02/18/2021   MSSA bacteremia    Discitis of lumbar region    Osteomyelitis ([place]) 01/17/2021   Mild persistent asthma without  complication 88/75/7972   OSA (obstructive sleep apnea) 03/03/2014   PCP:  Katherina Mires, MD Pharmacy:   [facility] [place], [place] - [address] AT [place] [address] [place] [address] Phone: [phone] Fax: [phone]  [id number] - [place], [place] - [address] [place] [address] Phone: [phone] Fax: [phone]     Social Determinants of Health (SDOH) Interventions    Readmission Risk Interventions Readmission Risk Prevention Plan 01/22/2021  Medication Screening Complete  Transportation Screening Complete  Some recent data might be hidden

## 2021-02-24 NOTE — Progress Notes (Signed)
PROGRESS NOTE    Haley Singh  XTG:[id number] DOB: May 28, 1951 DOA: 02/18/2021 PCP: Macy Mis, MD   Chief Complain: Intractable nausea/vomiting  Brief Narrative: Haley Singh is a 70 y.o. 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  On presentation, lab work showed potassium of 2.5.  CT abdomen/pelvis showed 5 mm partially obstructive stone in the left ureter.  Urology consulted and she underwent  cystoscopy with left ureteroscopy/laser lithotripsy/ureteral stent placement on 02/19/21.  MRI of the lumbar spine showed new severe discitis/osteomyelitis of L2-L3.  ID, neurosurgery consulted.  Neurosurgery not recommending surgical option.  Underwent IR guided  disc aspiration on 9.21.22, cultures have not shown any growth.  ID recommended to continue cefazolin till November 2.  Preparing for  discharge to home tomorrow  Assessment & Plan:   Principal Problem:   Epidural abscess Active Problems:   Mild persistent asthma without complication   Discitis of lumbar region   Intractable nausea and vomiting   Hypokalemia   Vertebral osteomyelitis (HCC)   Penicillin allergy   New severe discitis/osteomyelitis of L2-L3/Recent history of MSSA bacteremia/L4-L5 vertebral osteomyelitis/discitis: Hospitalized for this on August this year.  TEE was negative for vegetation.  She is supposed to be on cefazolin 2 g 3 times a day till 10/5 .  She follows with ID as an outpatient .CT abdomen/pelvis done during this hospitalization showed PROGRESSIVE OSTEOMYELITIS with interval destruction of the endplates at L2-L3 compared to CT 01/23/2021.  We ordered MRI of the lumbar spine with contrast for further evaluation and it showed severe discitis/osteomyelitis at L2-L3  with enhancing epidural phlegmon  spanning from L1-L2 to L4-L5 (up to 1 cm thick ventrally) and into bilateral L2-L3 foramina resulting severe canal stenosis at L2-L3 and moderate canal stenosis at the L2 and L3 levels.Also showed Surrounding paraspinal myositis and phlegmon with 8 mm abscess inthe right psoas at the L2-L3 level.We consulted  neurosurgery and ID . We have ordered blood cultures.  Underwent  Disc aspiration.  Aerobic/anaerobic  culture has not shown any organism.  ID recommending to continue same antibiotics till Nov 2. Neurosurgery not recommending surgical option .  She complains of persistent back pain but does not have significant focal neurological deficits.  Intractable nausea/vomiting/loose stools:  Abdominal x-ray did not show any acute findings. CT abdomen/pelvis showed 5 mm partially obstructing stone in the left ureter.Consulted urology, discussed with Dr. Alvester Morin.  Could not find any significant etiology for intractable nausea and vomiting except for ureteral stone.Underwent  cystoscopy with left ureteroscopy/laser lithotripsy/ureteral stent placement.  Nausea/vomiting have resolved   Hypokalemia/hypomagnesemia: Continue to monitor and supplement   Bilateral renal calculi: As seen on abd xray,chronic findings.5 mm partially obstructing stone in the left ureter.management as above   Mild persistent asthma : Continue bronchodilators as needed.  On Singulair.  Currently not in exacerbation   Severe hypertension:  Continue PRN medications for severe hypertension.  On amlodipine and losartan at home which we will continued, we increased the dose of amlodipine and losartan.  Added hydralazine.BP stable now   History of neuropathy: On gabapentin   History of hyperlipidemia: On statin   History of depression: On Effexor, trazodone   Debility/deconditioning: Recently discharged from skilled nursing facility.  Looks significantly weak, lives alone.  Requested consultation for PT/OT, recommending home health.  Plan  for discharge tomorrow.  She  states she is not ready today.  Hospital bed being arranged           DVT prophylaxis:Lovenox Code Status: Full Family Communication:None at bedside .  Patient does not have any family member Status is: Observation  Dispo: The patient is from: Home              Anticipated d/c is to: Home with home health tomorrow              Patient currently is not medically stable to d/c.   Difficult to place patient No     Consultants: Urology  Procedures:As above  Antimicrobials:  Anti-infectives (From admission, onward)    Start     Dose/Rate Route Frequency Ordered Stop   02/21/21 1500  amoxicillin (AMOXIL) capsule 500 mg        500 mg Oral  Once 02/21/21 1405 02/21/21 1656   02/18/21 1800  ceFAZolin (ANCEF) IVPB 2g/100 mL premix        2 g 200 mL/hr over 30 Minutes Intravenous Every 8 hours 02/18/21 1718         Subjective:  Patient seen and examined the bedside this morning.  Hemodynamically stable.  Complains of spasms on her legs.  She is does not feel ready to go home today.  Overall looks comfortable  Objective: Vitals:   02/23/21 2107 02/24/21 0509 02/24/21 0847 02/24/21 0848  BP: (!) 148/78 130/79    Pulse: 84 91    Resp: 17 17    Temp: 98.7 F (37.1 C) 98.5 F (36.9 C)    TempSrc: Oral Oral    SpO2: 100% 97% 97% 97%  Weight:      Height:        Intake/Output Summary (Last 24 hours) at 02/24/2021 1259 Last data filed at 02/24/2021 1030 Gross per 24 hour  Intake 1810 ml  Output 3250 ml  Net -1440 ml   Filed Weights   02/18/21 1712  Weight: 90.7 kg    Examination:  General exam: Deconditioned, chronically ill looking,weak HEENT: PERRL Respiratory system:  no wheezes or crackles  Cardiovascular system: S1 & S2 heard, RRR.  Gastrointestinal system: Abdomen is nondistended, soft and nontender. Central nervous system: Alert and oriented Extremities: No edema, no clubbing ,no cyanosis Skin: No rashes, no ulcers,no  icterus     Data Reviewed: I have personally reviewed following labs and imaging studies  CBC: Recent Labs  Lab 02/18/21 1428 02/19/21 0318 02/20/21 0242 02/22/21 0430  WBC 6.8 8.2 8.3 9.9  NEUTROABS 5.9  --  6.8 7.9*  HGB 9.2* 11.3* 11.3* 11.9*  HCT 29.3* 35.1* 35.0* 38.0  MCV 89.1 87.1 86.0 87.4  PLT 225 271 302 310   Basic Metabolic Panel: Recent Labs  Lab 02/18/21 1428 02/19/21 0318 02/19/21 1413 02/20/21 0242 02/21/21 0321 02/22/21 0430  NA 137 139  --  136 131* 131*  K 2.5* 2.8* 3.9 3.5 3.1* 3.5  CL 103 101  --  101 97* 98  CO2 23 25  --  25 25 26   GLUCOSE 165* 151*  --  164* 140* 110*  BUN 8 <5*  --  7* 8 11  CREATININE 0.53 0.38*  --  0.45 0.52 0.54  CALCIUM 8.1* 8.5*  --  8.5* 8.0* 8.1*  MG 1.6*  --   --  1.6* 2.1  --    GFR: Estimated Creatinine Clearance: 72.1 mL/min (by C-G formula based on SCr of 0.54 mg/dL). Liver Function Tests: Recent Labs  Lab 02/18/21 1428 02/19/21 0318  AST 13* 14*  ALT 5 5  ALKPHOS 100 108  BILITOT 0.8 0.8  PROT 7.6 8.8*  ALBUMIN 2.9* 3.4*   Recent Labs  Lab 02/18/21 1428  LIPASE 27   No results for input(s): AMMONIA in the last 168 hours. Coagulation Profile: Recent Labs  Lab 02/22/21 0430  INR 2.1*   Cardiac Enzymes: No results for input(s): CKTOTAL, CKMB, CKMBINDEX, TROPONINI in the last 168 hours. BNP (last 3 results) No results for input(s): PROBNP in the last 8760 hours. HbA1C: No results for input(s): HGBA1C in the last 72 hours. CBG: No results for input(s): GLUCAP in the last 168 hours. Lipid Profile: No results for input(s): CHOL, HDL, LDLCALC, TRIG, CHOLHDL, LDLDIRECT in the last 72 hours. Thyroid Function Tests: No results for input(s): TSH, T4TOTAL, FREET4, T3FREE, THYROIDAB in the last 72 hours. Anemia Panel: No results for input(s): VITAMINB12, FOLATE, FERRITIN, TIBC, IRON, RETICCTPCT in the last 72 hours. Sepsis Labs: No results for input(s): PROCALCITON, LATICACIDVEN in the last 168  hours.  Recent Results (from the past 240 hour(s))  Resp Panel by RT-PCR (Flu A&B, Covid) Nasopharyngeal Swab     Status: None   Collection Time: 02/18/21  6:15 PM   Specimen: Nasopharyngeal Swab; Nasopharyngeal(NP) swabs in vial transport medium  Result Value Ref Range Status   SARS Coronavirus 2 by RT PCR NEGATIVE NEGATIVE Final    Comment: (NOTE) SARS-CoV-2 target nucleic acids are NOT DETECTED.  The SARS-CoV-2 RNA is generally detectable in upper respiratory specimens during the acute phase of infection. The lowest concentration of SARS-CoV-2 viral copies this assay can detect is 138 copies/mL. A negative result does not preclude SARS-Cov-2 infection and should not be used as the sole basis for treatment or other patient management decisions. A negative result may occur with  improper specimen collection/handling, submission of specimen other than nasopharyngeal swab, presence of viral mutation(s) within the areas targeted by this assay, and inadequate number of viral copies(<138 copies/mL). A negative result must be combined with clinical observations, patient history, and epidemiological information. The expected result is Negative.  Fact Sheet for Patients:  BloggerCourse.com  Fact Sheet for Healthcare Providers:  SeriousBroker.it  This test is no t yet approved or cleared by the [place] FDA and  has been authorized for detection and/or diagnosis of SARS-CoV-2 by FDA under an Emergency Use Authorization (EUA). This EUA will remain  in effect (meaning this test can be used) for the duration of the COVID-19 declaration under Section 564(b)(1) of the Act, 21 U.S.C.section 360bbb-3(b)(1), unless the authorization is terminated  or revoked sooner.       Influenza A by PCR NEGATIVE NEGATIVE Final   Influenza B by PCR NEGATIVE NEGATIVE Final    Comment: (NOTE) The Xpert Xpress SARS-CoV-2/FLU/RSV plus assay is intended  as an aid in the diagnosis of influenza from Nasopharyngeal swab specimens and should not be used as a sole basis for treatment. Nasal washings and aspirates are unacceptable for Xpert Xpress SARS-CoV-2/FLU/RSV testing.  Fact Sheet for Patients: BloggerCourse.com  Fact Sheet for Healthcare Providers: SeriousBroker.it  This test is not yet approved or cleared by the [place] FDA and has been authorized for detection and/or diagnosis of SARS-CoV-2 by FDA under an Emergency Use Authorization (EUA). This EUA will remain in effect (meaning this test can be used) for the duration of the COVID-19 declaration under Section 564(b)(1) of the Act, 21 U.S.C. section 360bbb-3(b)(1), unless the authorization is terminated or revoked.  Performed at [facility], [address], [place], [place] [address]   Culture, blood (routine x 2)     Status: None (Preliminary result)   Collection Time: 02/22/21 12:28 PM   Specimen: BLOOD  Result Value Ref Range Status   Specimen Description   Final    BLOOD BLOOD LEFT HAND Performed at [facility], [address] [place], [place] [address]    Special Requests   Final    BOTTLES DRAWN AEROBIC AND ANAEROBIC Blood Culture adequate volume Performed at [facility], [address] [place], [place] [address]    Culture   Final    NO GROWTH < 24 HOURS Performed at [facility], [address], [place], [place] [address]    Report Status PENDING  Incomplete  Culture, blood (routine x 2)     Status: None (Preliminary result)   Collection Time: 02/22/21 12:28 PM   Specimen: BLOOD  Result Value Ref Range Status   Specimen Description   Final    BLOOD LEFT ANTECUBITAL Performed at [facility], [address], [place], [place] [address]    Special Requests   Final    BOTTLES DRAWN AEROBIC AND ANAEROBIC Blood Culture  adequate volume Performed at [facility], [address], [place], [place] [address]    Culture   Final    NO GROWTH < 24 HOURS Performed at [facility], [address], [place], [place] [address]    Report Status PENDING  Incomplete  Aerobic/Anaerobic Culture w Gram Stain (surgical/deep wound)     Status: None (Preliminary result)   Collection Time: 02/22/21  6:00 PM   Specimen: Body Fluid  Result Value Ref Range Status   Specimen Description   Final    FLUID INTERVERTEBRAL DISC Performed at [facility], [address] [place], [place] [address]    Special Requests   Final    NONE Performed at [facility], [address] [place], [place] [address]    Gram Stain   Final    FEW SQUAMOUS EPITHELIAL CELLS PRESENT MODERATE WBC PRESENT, PREDOMINANTLY PMN NO ORGANISMS SEEN    Culture   Final    NO GROWTH 1 DAY Performed at [facility], [address], [place], [place] [address]    Report Status PENDING  Incomplete         Radiology Studies: IR Fluoro Guide Ndl Plmt / BX  Result Date: 02/23/2021 INDICATION: 70 year old female with severe discitis osteomyelitis at L2-L3. EXAM: Lumbar DISC ASPIRATION UNDER FLUOROSCOPY MEDICATIONS: Lidocaine 1% subcutaneous ANESTHESIA/SEDATION: Moderate (conscious) sedation was employed during this procedure. A total of Versed 2 mg and Fentanyl 100 mcg was administered intravenously. Moderate Sedation Time: 10 minutes. The patient's level of consciousness and vital signs were monitored continuously by radiology nursing throughout the procedure under my direct supervision. PROCEDURE: Informed written consent was obtained from the patient after a thorough discussion of the procedural risks, benefits and alternatives. All questions were addressed. Maximal Sterile Barrier Technique was utilized including caps, mask, sterile gowns, sterile gloves, sterile drape, hand hygiene and skin  antiseptic. A timeout was performed prior to the initiation of the procedure. Cone beam CT of the lumbar spine was performed. The appropriate interspace was identified and targeted with needle entry guidance software. An appropriate skin entry site was determined. After local infiltration with 1% lidocaine, an 18 gauge trocar needle was advanced into the  interspace from a left posterolateral extraforaminal approach. Needle tip position within the interspace confirmed on biplane images. Approximately 5 mL of sanguinopurulent fluid was aspirated, sent for the requested laboratory studies. The patient tolerated the procedure well. FLUOROSCOPY TIME:  35 seconds, 70 mGy COMPLICATIONS: None immediate. IMPRESSION: Technically successful L2-L3 disc aspiration under fluoroscopy. Marliss Coots, MD [facility] Radiology Electronically Signed   By: Marliss Coots M.D.   On: 02/23/2021 07:39        Scheduled Meds:  alteplase  2 mg Intracatheter Once   amLODipine  10 mg Oral Daily   atorvastatin  40 mg Oral Daily   Chlorhexidine Gluconate Cloth  6 each Topical Daily   enoxaparin (LOVENOX) injection  40 mg Subcutaneous Q24H   fluticasone  1 spray Each Nare BID   fluticasone furoate-vilanterol  1 puff Inhalation q morning   gabapentin  600 mg Oral QHS   hydrALAZINE  25 mg Oral Q8H   losartan  100 mg Oral Daily   montelukast  10 mg Oral Daily   senna  1 tablet Oral BID   sodium chloride flush  10-40 mL Intracatheter Q12H   traZODone  150 mg Oral QHS   venlafaxine XR  150 mg Oral q morning   Continuous Infusions:   ceFAZolin (ANCEF) IV 2 g (02/24/21 1025)     LOS: 3 days    Time spent: 25 mins.More than 50% of that time was spent in counseling and/or coordination of care.      Burnadette Pop, MD [facility] P9/23/2022, 12:59 PM

## 2021-02-24 NOTE — Progress Notes (Signed)
Initial Nutrition Assessment  DOCUMENTATION CODES:   Obesity unspecified  INTERVENTION:  - will order Boost Breeze BID, each supplement provides 250 kcal and 9 grams of protein. - will order Magic Cup BID with meals, each supplement provides 290 kcal and 9 grams of protein. - will Hormel Shake with breakfast meals, each supplement provides 500 kcal and 22 grams protein. - will order 1 tablet multivitamin with minerals/day.    NUTRITION DIAGNOSIS:   Increased nutrient needs related to acute illness as evidenced by estimated needs.  GOAL:   Patient will meet greater than or equal to 90% of their needs  MONITOR:   PO intake, Supplement acceptance, Labs, Weight trends  REASON FOR ASSESSMENT:   Malnutrition Screening Tool  ASSESSMENT:   70 y.o. female with medical history of discitis/osteomyelitis currently on Ancef at home, HTN, asthma, depression, HLD, and arthritis. She presented to the ED from home d/t persistent N/V. CT abdomen/pelvis showed partially stone in L ureter. Urology was consulted and she underwent cystoscopy with L ureteroscopy/laser lithotripsy/ureteral stent placement on 9/18. MRI of the lumbar spine showed new severe discitis/osteomyelitis. She underwent IR-guided disc aspiration on 9/21.  Patient unavailable. Intakes over the past 3 days have been 0-25%.   She was seen by another [place] RD on 8/17 and 8/24. At that time oral nutrition supplements had been ordered, patient was accepting them, and she was eating 50-75% at meals.   She has not been weighed since admission date of 9/17.   Per notes: - new severe, progressive discitis/osteomyelitis of L2-3 - intractable N/V and loose stools--abdominal x-ray did not show any acute findings - s/p cystoscopy with L ureteroscopy  - debility and deconditioning - plan for d/c tomorrow as patient does not feel she is ready today   Labs reviewed; Na: 131 mmol/l, Ca: 8.1 mg/dl.  Medications reviewed; 1 tablet  senokot BID.  NUTRITION - FOCUSED PHYSICAL EXAM:  Unable to complete at this time.   Diet Order:   Diet Order             Diet Heart Room service appropriate? Yes; Fluid consistency: Thin  Diet effective now                   EDUCATION NEEDS:   No education needs have been identified at this time  Skin:  Skin Assessment: Reviewed RN Assessment  Last BM:  PTA/unknown  Height:   Ht Readings from Last 1 Encounters:  02/18/21 5\' 6"  (1.676 m)    Weight:   Wt Readings from Last 1 Encounters:  02/18/21 90.7 kg     Estimated Nutritional Needs:  Kcal:  1900-2100 kcal Protein:  85-95 grams Fluid:  >/= 2.2 L/day      02/20/21, MS, RD, LDN, CNSC Inpatient Clinical Dietitian RD pager # available in [facility]  After hours/weekend pager # available in [facility]

## 2021-02-24 NOTE — Progress Notes (Signed)
Physical Therapy Treatment Patient Details Name: Haley Singh MRN: [id number] DOB: 21-Aug-1950 Today's Date: 02/24/2021   History of Present Illness Patient is a 70 year old female who presented with intractable nausea and vomitting. patient was found to have left ureteral stone. patient underwent a ureteral stent placement on 9/18. Pt also with recent discitis/osteomyelitis of spine and still on antibiotics at home.  However, New MRI this admission showed new severe discitis/osteomyelitis of L2-L3.  Had disc aspiration on 02/22/21. PMH: MDD, OA, chronic back pain, HTN, HLD, asthma, depression, anxiety, insomnia, R ankle fx, septic arthritis of spine    PT Comments    Pt with significant decline in mobility since last treatment 2 days ago.  Today, she required mod A, heavy reliance on bed features, and significantly increased time for all transfers due to pain (25 mins to move from supine to sit).  She was not able to ambulate household distances or perform ADLS.  Pt lives alone and will not have 24 hr support.  Updating recommendations to SNF based on decline in mobility and unsafe to return home    Recommendations for follow up therapy are one component of a multi-disciplinary discharge planning process, led by the attending physician.  Recommendations may be updated based on patient status, additional functional criteria and insurance authorization.  Follow Up Recommendations  SNF     Equipment Recommendations  Rolling walker with 5" wheels;Hospital bed    Recommendations for Other Services       Precautions / Restrictions Precautions Precautions: Fall;Back Required Braces or Orthoses: Spinal Brace Spinal Brace: Lumbar corset;Applied in sitting position (had brace prior to admit)     Mobility  Bed Mobility Overal bed mobility: Needs Assistance Bed Mobility: Supine to Sit;Sit to Supine     Supine to sit: Mod assist Sit to supine: Mod assist   General bed mobility comments:  supine to sit: heavy reliance on bed features with head slowly elevated up to 65 degrees then slowly working legs over and therapist providing mod A for trunk and legs; took 25 mins to get to EOB due to pain.  Sit to supine: again heavy reliance on bed features with mod A for legs and slow movements    Transfers Overall transfer level: Needs assistance Equipment used: Rolling walker (2 wheeled) Transfers: Sit to/from Stand Sit to Stand: Mod assist;From elevated surface         General transfer comment: Requiring bed significantly elevated, "so that I can just slide off,"  discussed need to be able to stand from other surfaces but pt too painful; still requiring mod A and increased time to rise  Ambulation/Gait Ambulation/Gait assistance: Min assist Gait Distance (Feet): 30 Feet Assistive device: Rolling walker (2 wheeled) Gait Pattern/deviations: Step-to pattern;Trunk flexed Gait velocity: slow   General Gait Details: increased time with cues for posutre as able; limited by pain   Stairs             Wheelchair Mobility    Modified Rankin (Stroke Patients Only)       Balance Overall balance assessment: Needs assistance Sitting-balance support: Feet supported Sitting balance-Leahy Scale: Fair Sitting balance - Comments: Using UEs but more for pain control   Standing balance support: Bilateral upper extremity supported Standing balance-Leahy Scale: Poor Standing balance comment: requiring RW                            Cognition Arousal/Alertness: Awake/alert Behavior During  Therapy: Anxious Overall Cognitive Status: Within Functional Limits for tasks assessed                                        Exercises      General Comments General comments (skin integrity, edema, etc.): Pt's current plan was home with intermittent assist (aide a few hours a day and occasional assist from friend).  Discussed the level of assist and time needed  for transfers today. Discussed home safety in emergencies, difficulty of stairs and car transfers, how she would go to bathroom if alone, etc.  PT recommending SNF vs 24 hr care - pt verbalizing understanding and will consider      Pertinent Vitals/Pain Pain Assessment: 0-10 Pain Score: 9  Pain Location: back and LLE exacerbated with activity Pain Descriptors / Indicators: Grimacing;Sore;Spasm Pain Intervention(s): Limited activity within patient's tolerance;Monitored during session;Premedicated before session;Repositioned (very slow movements with assist)    Home Living                      Prior Function            PT Goals (current goals can now be found in the care plan section) Progress towards PT goals: Not progressing toward goals - comment (increased pain)    Frequency    Min 3X/week      PT Plan Discharge plan needs to be updated    Co-evaluation              AM-PAC PT "6 Clicks" Mobility   Outcome Measure  Help needed turning from your back to your side while in a flat bed without using bedrails?: A Little Help needed moving from lying on your back to sitting on the side of a flat bed without using bedrails?: A Lot Help needed moving to and from a bed to a chair (including a wheelchair)?: A Lot Help needed standing up from a chair using your arms (e.g., wheelchair or bedside chair)?: A Lot Help needed to walk in hospital room?: A Little Help needed climbing 3-5 steps with a railing? : A Lot 6 Click Score: 14    End of Session Equipment Utilized During Treatment: Gait belt Activity Tolerance: Patient limited by pain Patient left: with call bell/phone within reach;with bed alarm set;in bed Nurse Communication: Mobility status PT Visit Diagnosis: Other abnormalities of gait and mobility (R26.89);Pain Pain - Right/Left: Left Pain - part of body: Leg (back)     Time: 7124-5809 PT Time Calculation (min) (ACUTE ONLY): 48 min  Charges:  $Gait  Training: 8-22 mins $Therapeutic Activity: 23-37 mins                     Haley Singh, PT Acute Rehab Services Pager [phone] [facility] [phone]    Haley Singh 02/24/2021, 1:50 PM

## 2021-02-24 NOTE — Care Management Important Message (Signed)
Important Message  Patient Details IM Letter given to the Patient. Name: Haley Singh MRN: [id number] Date of Birth: 1950-10-16   Medicare Important Message Given:  Yes     Caren Macadam 02/24/2021, 11:09 AM

## 2021-02-24 NOTE — NC FL2 (Addendum)
[place] MEDICAID FL2 LEVEL OF CARE SCREENING TOOL     IDENTIFICATION  Patient Name: Haley Singh Birthdate: 1951/01/08 Sex: female Admission Date (Current Location): 02/18/2021  [facility] and [place] Number:  Producer, television/film/video and Address:  [facility],  [address] [place] [address]      Provider Number: [id number]  Attending Physician Name and Address:  Burnadette Pop, MD  Relative Name and Phone Number:       Current Level of Care: Hospital Recommended Level of Care: Skilled Nursing Facility Prior Approval Number:    Date Approved/Denied:   PASRR Number: [id number]  Discharge Plan: SNF    Current Diagnoses: Patient Active Problem List   Diagnosis Date Noted   Penicillin allergy    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   Vertebral osteomyelitis (HCC) 02/21/2021   Intractable nausea and vomiting 02/18/2021   Hypokalemia 02/18/2021   MSSA bacteremia    Discitis of lumbar region    Osteomyelitis (HCC) 01/17/2021   Mild persistent asthma without complication 05/17/2016   OSA (obstructive sleep apnea) 03/03/2014    Orientation RESPIRATION BLADDER Height & Weight     Self, Time, Situation, Place  Normal Continent Weight: 199 lb 15.3 oz (90.7 kg) Height:  5\' 6"  (167.6 cm)  BEHAVIORAL SYMPTOMS/MOOD NEUROLOGICAL BOWEL NUTRITION STATUS      Continent    AMBULATORY STATUS COMMUNICATION OF NEEDS Skin   Limited Assist Verbally Normal                       Personal Care Assistance Level of Assistance  Bathing, Dressing Bathing Assistance: Limited assistance   Dressing Assistance: Limited assistance     Functional Limitations Info             SPECIAL CARE FACTORS FREQUENCY  PT (By licensed PT), OT (By licensed OT) (IV abx)     PT Frequency: 5x/wk OT Frequency: 5x/wk            Contractures Contractures Info: Not present    Additional Factors Info  Code Status, Allergies, Psychotropic Code Status Info: DNR Allergies Info:  Oxycontin (Oxycodone), Tetracyclines & Related Psychotropic Info: see MAR         Current Medications (02/28/2021):  This is the current hospital active medication list Current Facility-Administered Medications  Medication Dose Route Frequency Provider Last Rate Last Admin   alteplase (CATHFLO ACTIVASE) injection 2 mg  2 mg Intracatheter Once 03/02/2021, MD       alum & mag hydroxide-simeth (MAALOX/MYLANTA) 200-200-20 MG/5ML suspension 30 mL  30 mL Oral Q4H PRN 12-11-2000, MD   30 mL at 02/20/21 2113   amLODipine (NORVASC) tablet 10 mg  10 mg Oral Daily 2114, MD   10 mg at 02/27/21 0859   atorvastatin (LIPITOR) tablet 40 mg  40 mg Oral Daily 03/01/21, MD   40 mg at 02/28/21 0903   ceFAZolin (ANCEF) IVPB 2g/100 mL premix  2 g Intravenous Q8H 03/02/21, RPH 200 mL/hr at 02/28/21 0901 2 g at 02/28/21 0901   celecoxib (CELEBREX) capsule 200-400 mg  200-400 mg Oral Daily PRN 03/02/21, MD   400 mg at 02/27/21 2209   Chlorhexidine Gluconate Cloth 2 % PADS 6 each  6 each Topical Daily 2210, MD   6 each at 02/26/21 0949   cyclobenzaprine (FLEXERIL) tablet 10 mg  10 mg Oral TID PRN 02/28/21, MD   10 mg at 02/28/21 0903   diphenhydrAMINE (BENADRYL)  injection 25 mg  25 mg Intravenous Once PRN Daiva Eves, Lisette Grinder, MD       enoxaparin (LOVENOX) injection 40 mg  40 mg Subcutaneous Q24H Allred, Darrell K, PA-C   40 mg at 02/27/21 2208   EPINEPHrine (EPI-PEN) injection 0.3 mg  0.3 mg Intramuscular Once PRN Daiva Eves, Lisette Grinder, MD       feeding supplement (BOOST / RESOURCE BREEZE) liquid 1 Container  1 Container Oral BID BM Burnadette Pop, MD   1 Container at 02/27/21 1346   fluticasone (FLONASE) 50 MCG/ACT nasal spray 1 spray  1 spray Each Nare BID Burnadette Pop, MD   1 spray at 02/24/21 1023   fluticasone furoate-vilanterol (BREO ELLIPTA) 100-25 MCG/INH 1 puff  1 puff Inhalation q morning Burnadette Pop, MD   1 puff at 02/28/21 0748    gabapentin (NEURONTIN) capsule 600 mg  600 mg Oral QHS Burnadette Pop, MD   600 mg at 02/27/21 2207   hydrALAZINE (APRESOLINE) tablet 25 mg  25 mg Oral Q8H Adhikari, Amrit, MD   25 mg at 02/28/21 0546   ipratropium-albuterol (DUONEB) 0.5-2.5 (3) MG/3ML nebulizer solution 3 mL  3 mL Nebulization Q6H PRN Adhikari, Amrit, MD       labetalol (NORMODYNE) injection 10 mg  10 mg Intravenous Q2H PRN Adhikari, Amrit, MD       lidocaine (PF) (XYLOCAINE) 1 % injection    PRN Suttle, Thressa Sheller, MD   10 mL at 02/22/21 1751   losartan (COZAAR) tablet 100 mg  100 mg Oral Daily Burnadette Pop, MD   100 mg at 02/27/21 0859   midazolam (VERSED) injection    PRN Suttle, Thressa Sheller, MD   1 mg at 02/22/21 1749   montelukast (SINGULAIR) tablet 10 mg  10 mg Oral Daily Burnadette Pop, MD   10 mg at 02/28/21 8546   morphine 2 MG/ML injection 2 mg  2 mg Intravenous Q4H PRN Burnadette Pop, MD   2 mg at 02/24/21 0417   multivitamin with minerals tablet 1 tablet  1 tablet Oral Daily Burnadette Pop, MD   1 tablet at 02/28/21 0904   ondansetron (ZOFRAN) injection 4 mg  4 mg Intravenous Q6H PRN Burnadette Pop, MD   4 mg at 02/21/21 1228   oxyCODONE-acetaminophen (PERCOCET/ROXICET) 5-325 MG per tablet 1 tablet  1 tablet Oral Q6H PRN Burnadette Pop, MD   1 tablet at 02/28/21 0903   senna (SENOKOT) tablet 8.6 mg  1 tablet Oral BID Burnadette Pop, MD   8.6 mg at 02/28/21 0904   sodium chloride flush (NS) 0.9 % injection 10-40 mL  10-40 mL Intracatheter Q12H Adhikari, Amrit, MD   10 mL at 02/24/21 2133   sodium chloride flush (NS) 0.9 % injection 10-40 mL  10-40 mL Intracatheter PRN Burnadette Pop, MD       SUMAtriptan (IMITREX) tablet 50 mg  50 mg Oral Daily PRN Burnadette Pop, MD       traZODone (DESYREL) tablet 150 mg  150 mg Oral QHS Adhikari, Amrit, MD   150 mg at 02/27/21 2209   venlafaxine XR (EFFEXOR-XR) 24 hr capsule 150 mg  150 mg Oral q morning Burnadette Pop, MD   150 mg at 02/28/21 2703     Discharge  Medications: Please see discharge summary for a list of discharge medications.  Relevant Imaging Results:  Relevant Lab Results:   Additional Information SSN: [id number];  IV antibiotics needed; COVID vaccines x 4  Franchon Ketterman, LCSW

## 2021-02-25 DIAGNOSIS — R112 Nausea with vomiting, unspecified: Secondary | ICD-10-CM | POA: Diagnosis not present

## 2021-02-25 LAB — CREATININE, SERUM
Creatinine, Ser: 0.69 mg/dL (ref 0.44–1.00)
GFR, Estimated: >60 mL/min (ref >60)

## 2021-02-25 MED ORDER — METHOCARBAMOL 500 MG PO TABS
1000.0000 mg | ORAL_TABLET | Freq: Four times a day (QID) | ORAL | Status: DC | PRN
  Administered 2021-02-25 – 2021-02-26 (×4): 1000 mg via ORAL
  Filled 2021-02-25 (×4): qty 2

## 2021-02-25 NOTE — Progress Notes (Signed)
Haley NOTE    PRICELLA GAUGH  CBJ:[id number] DOB: 07-20-1950 DOA: 02/18/2021 PCP: Macy Mis, Haley   Chief Complain: Intractable nausea/vomiting  Brief Narrative: Haley Singh is a 70 y.o. female with medical history significant of discitis/osteomyelitis currently on Ancef at [facility], Haley Singh, Haley Singh, Haley Singh, Haley [facility], arthritis who presented here in the emergency department from [facility] due to persistent nausea and vomiting.  On presentation, lab work showed potassium of 2.5.  CT abdomen/pelvis showed 5 mm partially obstructive stone in the left ureter.  Urology consulted and she underwent  cystoscopy with left ureteroscopy/laser lithotripsy/ureteral stent placement on 02/19/21.  MRI of the lumbar spine showed new severe discitis/osteomyelitis of L2-L3.  ID, neurosurgery consulted.  Neurosurgery not recommending surgical option.  Underwent IR guided  disc aspiration on 9.21.22, cultures have not shown any growth.  ID recommended to continue cefazolin till November 2.  PT recommended skilled nursing facility on discharge.  Medically stable for discharge, she will be discharged on Monday.   Assessment & Plan:   Principal Problem:   Epidural abscess Active Problems:   Mild persistent [facility] without complication   Discitis of lumbar region   Intractable nausea and vomiting   Hypokalemia   Vertebral osteomyelitis (HCC)   Penicillin allergy   New severe discitis/osteomyelitis of L2-L3/Recent history of MSSA bacteremia/L4-L5 vertebral osteomyelitis/discitis: Hospitalized for this on August this year.  TEE was negative for vegetation.  She is supposed to be on cefazolin 2 g 3 times a day till 10/5 .  She follows with ID as an outpatient .CT abdomen/pelvis done during this hospitalization showed PROGRESSIVE OSTEOMYELITIS with interval destruction of the endplates at L2-L3 compared to CT 01/23/2021.  We ordered MRI of the lumbar spine with contrast for further evaluation and it showed  severe discitis/osteomyelitis at L2-L3  with enhancing epidural phlegmon spanning from L1-L2 to L4-L5 (up to 1 cm thick ventrally) and into bilateral L2-L3 foramina resulting severe canal stenosis at L2-L3 and moderate canal stenosis at the L2 and L3 levels.Also showed Surrounding paraspinal myositis and phlegmon with 8 mm abscess inthe right psoas at the L2-L3 level.We consulted  neurosurgery and ID . We have ordered blood cultures.  Underwent  Disc aspiration.  Aerobic/anaerobic  culture has not shown any organism.  ID recommending to continue same antibiotics till Nov 2. Neurosurgery not recommending surgical option .  She complains of persistent back pain but does not have significant focal neurological deficits.  Intractable nausea/vomiting/loose stools:  Abdominal x-ray did not show any acute findings. CT abdomen/pelvis showed 5 mm partially obstructing stone in the left ureter.Consulted urology, discussed with Dr. Alvester Morin.  Could not find any significant etiology for intractable nausea and vomiting except for ureteral stone.Underwent  cystoscopy with left ureteroscopy/laser lithotripsy/ureteral stent placement.  Nausea/vomiting have resolved   Hypokalemia/hypomagnesemia: Continue to monitor and supplement   Bilateral renal calculi: As seen on abd xray,chronic findings.5 mm partially obstructing stone in the left ureter.management as above   Mild persistent Haley [facility] : Continue bronchodilators as needed.  On Singulair.  Currently not in exacerbation   Severe Haley Singh:  Continue PRN medications for severe Haley Singh.  On amlodipine and losartan at [facility] which we will continued, we increased the dose of amlodipine and losartan.  Added hydralazine.BP stable now   History of neuropathy: On gabapentin   History of Haley Singh: On statin   History of Haley Singh: On Effexor, trazodone   Debility/deconditioning: Recently discharged from skilled nursing facility.  Looks significantly weak, lives  alone.  Requested consultation  for PT/OT, recommending SNF    Nutrition Problem: Increased nutrient needs Etiology: acute illness      DVT prophylaxis:Lovenox Code Status: Full Family Communication:None at bedside .  Patient does not have any family member Status is: Observation  Dispo: The patient is from: [facility]              Anticipated d/c is to: SNF on Monday              Patient currently is medically stable for discharge   Difficult to place patient No     Consultants: Urology  Procedures:As above  Antimicrobials:  Anti-infectives (From admission, onward)    Start     Dose/Rate Route Frequency Ordered Stop   02/21/21 1500  amoxicillin (AMOXIL) capsule 500 mg        500 mg Oral  Once 02/21/21 1405 02/21/21 1656   02/18/21 1800  ceFAZolin (ANCEF) IVPB 2g/100 mL premix        2 g 200 mL/hr over 30 Minutes Intravenous Every 8 hours 02/18/21 1718         Subjective:  Patient seen and examined at the bedside this morning.  Overall comfortable, had a good night.  Complains of spasms on her bilateral legs  Objective: Vitals:   02/24/21 1416 02/24/21 2230 02/25/21 0544 02/25/21 0839  BP: 125/64 (!) 113/57 125/69   Pulse: 90 90 85   Resp: 17 20 16    Temp: 98 F (36.7 C) 98.5 F (36.9 C) 97.7 F (36.5 C)   TempSrc:  Oral Oral   SpO2: 98%  97% 96%  Weight:      Height:        Intake/Output Summary (Last 24 hours) at 02/25/2021 1114 Last data filed at 02/25/2021 0544 Gross per 24 hour  Intake 930 ml  Output 2200 ml  Net -1270 ml   Filed Weights   02/18/21 1712  Weight: 90.7 kg    Examination:  General exam: Overall comfortable, not in distress HEENT: PERRL Respiratory system:  no wheezes or crackles  Cardiovascular system: S1 & S2 heard, RRR.  Gastrointestinal system: Abdomen is nondistended, soft and nontender. Central nervous system: Alert and oriented Extremities: No edema, no clubbing ,no cyanosis Skin: No rashes, no ulcers,no icterus        Data Reviewed: I have personally reviewed following labs and imaging studies  CBC: Recent Labs  Lab 02/18/21 1428 02/19/21 0318 02/20/21 0242 02/22/21 0430  WBC 6.8 8.2 8.3 9.9  NEUTROABS 5.9  --  6.8 7.9*  HGB 9.2* 11.3* 11.3* 11.9*  HCT 29.3* 35.1* 35.0* 38.0  MCV 89.1 87.1 86.0 87.4  PLT 225 271 302 310   Basic Metabolic Panel: Recent Labs  Lab 02/18/21 1428 02/19/21 0318 02/19/21 1413 02/20/21 0242 02/21/21 0321 02/22/21 0430 02/25/21 0323  NA 137 139  --  136 131* 131*  --   K 2.5* 2.8* 3.9 3.5 3.1* 3.5  --   CL 103 101  --  101 97* 98  --   CO2 23 25  --  25 25 26   --   GLUCOSE 165* 151*  --  164* 140* 110*  --   BUN 8 <5*  --  7* 8 11  --   CREATININE 0.53 0.38*  --  0.45 0.52 0.54 0.69  CALCIUM 8.1* 8.5*  --  8.5* 8.0* 8.1*  --   MG 1.6*  --   --  1.6* 2.1  --   --  GFR: Estimated Creatinine Clearance: 72.1 mL/min (by C-G formula based on SCr of 0.69 mg/dL). Liver Function Tests: Recent Labs  Lab 02/18/21 1428 02/19/21 0318  AST 13* 14*  ALT 5 5  ALKPHOS 100 108  BILITOT 0.8 0.8  PROT 7.6 8.8*  ALBUMIN 2.9* 3.4*   Recent Labs  Lab 02/18/21 1428  LIPASE 27   No results for input(s): AMMONIA in the last 168 hours. Coagulation Profile: Recent Labs  Lab 02/22/21 0430  INR 2.1*   Cardiac Enzymes: No results for input(s): CKTOTAL, CKMB, CKMBINDEX, TROPONINI in the last 168 hours. BNP (last 3 results) No results for input(s): PROBNP in the last 8760 hours. HbA1C: No results for input(s): HGBA1C in the last 72 hours. CBG: No results for input(s): GLUCAP in the last 168 hours. Lipid Profile: No results for input(s): CHOL, HDL, LDLCALC, TRIG, CHOLHDL, LDLDIRECT in the last 72 hours. Thyroid Function Tests: No results for input(s): TSH, T4TOTAL, FREET4, T3FREE, THYROIDAB in the last 72 hours. Anemia Panel: No results for input(s): VITAMINB12, FOLATE, FERRITIN, TIBC, IRON, RETICCTPCT in the last 72 hours. Sepsis Labs: No results for  input(s): PROCALCITON, LATICACIDVEN in the last 168 hours.  Recent Results (from the past 240 hour(s))  Resp Panel by RT-PCR (Flu A&B, Covid) Nasopharyngeal Swab     Status: None   Collection Time: 02/18/21  6:15 PM   Specimen: Nasopharyngeal Swab; Nasopharyngeal(NP) swabs in vial transport medium  Result Value Ref Range Status   SARS Coronavirus 2 by RT PCR NEGATIVE NEGATIVE Final    Comment: (NOTE) SARS-CoV-2 target nucleic acids are NOT DETECTED.  The SARS-CoV-2 RNA is generally detectable in upper respiratory specimens during the acute phase of infection. The lowest concentration of SARS-CoV-2 viral copies this assay can detect is 138 copies/mL. A negative result does not preclude SARS-Cov-2 infection and should not be used as the sole basis for treatment or other patient management decisions. A negative result may occur with  improper specimen collection/handling, submission of specimen other than nasopharyngeal swab, presence of viral mutation(s) within the areas targeted by this assay, and inadequate number of viral copies(<138 copies/mL). A negative result must be combined with clinical observations, patient history, and epidemiological information. The expected result is Negative.  Fact Sheet for Patients:  BloggerCourse.com  Fact Sheet for Healthcare Providers:  SeriousBroker.it  This test is no t yet approved or cleared by the [place] FDA and  has been authorized for detection and/or diagnosis of SARS-CoV-2 by FDA under an Emergency Use Authorization (EUA). This EUA will remain  in effect (meaning this test can be used) for the duration of the COVID-19 declaration under Section 564(b)(1) of the Act, 21 U.S.C.section 360bbb-3(b)(1), unless the authorization is terminated  or revoked sooner.       Influenza A by PCR NEGATIVE NEGATIVE Final   Influenza B by PCR NEGATIVE NEGATIVE Final    Comment: (NOTE) The  Xpert Xpress SARS-CoV-2/FLU/RSV plus assay is intended as an aid in the diagnosis of influenza from Nasopharyngeal swab specimens and should not be used as a sole basis for treatment. Nasal washings and aspirates are unacceptable for Xpert Xpress SARS-CoV-2/FLU/RSV testing.  Fact Sheet for Patients: BloggerCourse.com  Fact Sheet for Healthcare Providers: SeriousBroker.it  This test is not yet approved or cleared by the [place] FDA and has been authorized for detection and/or diagnosis of SARS-CoV-2 by FDA under an Emergency Use Authorization (EUA). This EUA will remain in effect (meaning this test can be used) for the duration  of the COVID-19 declaration under Section 564(b)(1) of the Act, 21 U.S.C. section 360bbb-3(b)(1), unless the authorization is terminated or revoked.  Performed at [facility], [address], [place], [place] [address]   Culture, blood (routine x 2)     Status: None (Preliminary result)   Collection Time: 02/22/21 12:28 PM   Specimen: BLOOD  Result Value Ref Range Status   Specimen Description   Final    BLOOD BLOOD LEFT HAND Performed at [facility], [address] [place], [place] [address]    Special Requests   Final    BOTTLES DRAWN AEROBIC AND ANAEROBIC Blood Culture adequate volume Performed at [facility], [address] [place], [place] [address]    Culture   Final    NO GROWTH 3 DAYS Performed at [facility], [address] [place], [place] [address]    Report Status PENDING  Incomplete  Culture, blood (routine x 2)     Status: None (Preliminary result)   Collection Time: 02/22/21 12:28 PM   Specimen: BLOOD  Result Value Ref Range Status   Specimen Description   Final    BLOOD LEFT ANTECUBITAL Performed at [facility], [address] [place], [place] [address]    Special Requests   Final     BOTTLES DRAWN AEROBIC AND ANAEROBIC Blood Culture adequate volume Performed at [facility], [address] [place], [place] [address]    Culture   Final    NO GROWTH 3 DAYS Performed at [facility], [address] [place], [place] [address]    Report Status PENDING  Incomplete  Aerobic/Anaerobic Culture w Gram Stain (surgical/deep wound)     Status: None (Preliminary result)   Collection Time: 02/22/21  6:00 PM   Specimen: Body Fluid  Result Value Ref Range Status   Specimen Description   Final    FLUID INTERVERTEBRAL DISC Performed at [facility], [address], [place], [place] [address]    Special Requests   Final    NONE Performed at [facility], [address] [place], [place] [address]    Gram Stain   Final    FEW SQUAMOUS EPITHELIAL CELLS PRESENT MODERATE WBC PRESENT, PREDOMINANTLY PMN NO ORGANISMS SEEN    Culture   Final    NO GROWTH 1 DAY Performed at [facility], [address] [place], [place] [address]    Report Status PENDING  Incomplete         Radiology Studies: No results found.      Scheduled Meds:  alteplase  2 mg Intracatheter Once   amLODipine  10 mg Oral Daily   atorvastatin  40 mg Oral Daily   Chlorhexidine Gluconate Cloth  6 each Topical Daily   enoxaparin (LOVENOX) injection  40 mg Subcutaneous Q24H   feeding supplement  1 Container Oral BID BM   fluticasone  1 spray Each Nare BID   fluticasone furoate-vilanterol  1 puff Inhalation q morning   gabapentin  600 mg Oral QHS   hydrALAZINE  25 mg Oral Q8H   losartan  100 mg Oral Daily   montelukast  10 mg Oral Daily   multivitamin with minerals  1 tablet Oral Daily   senna  1 tablet Oral BID   sodium chloride flush  10-40 mL Intracatheter Q12H   traZODone  150 mg Oral QHS   venlafaxine XR  150 mg  Oral q morning   Continuous Infusions:   ceFAZolin (ANCEF) IV 2 g (02/25/21 0926)     LOS: 4 days    Time  spent: 25 mins.More than 50% of that time was spent in counseling and/or coordination of care.      Burnadette Pop, [facility] P9/24/2022, 11:14 AM

## 2021-02-25 NOTE — Plan of Care (Signed)
  Problem: Education: Goal: Knowledge of General Education information will improve Description Including pain rating scale, medication(s)/side effects and non-pharmacologic comfort measures Outcome: Progressing   

## 2021-02-26 DIAGNOSIS — R112 Nausea with vomiting, unspecified: Secondary | ICD-10-CM | POA: Diagnosis not present

## 2021-02-26 LAB — RESP PANEL BY RT-PCR (FLU A&B, COVID) ARPGX2
Influenza A by PCR: NEGATIVE
Influenza B by PCR: NEGATIVE
SARS Coronavirus 2 by RT PCR: NEGATIVE

## 2021-02-26 MED ORDER — CYCLOBENZAPRINE HCL 10 MG PO TABS
10.0000 mg | ORAL_TABLET | Freq: Three times a day (TID) | ORAL | Status: DC | PRN
  Administered 2021-02-26 – 2021-02-28 (×6): 10 mg via ORAL
  Filled 2021-02-26 (×6): qty 1

## 2021-02-26 MED ORDER — CYCLOBENZAPRINE HCL 5 MG PO TABS: 7.5000 mg | ORAL_TABLET | Freq: Three times a day (TID) | ORAL | Status: DC | PRN

## 2021-02-26 NOTE — Progress Notes (Signed)
OT Cancellation Note  Patient Details Name: Haley Singh MRN: [id number] DOB: 08/14/1950   Cancelled Treatment:    Reason Eval/Treat Not Completed: Pain limiting ability to participate. Patient declined due to pain and muscle spasms. Agreeable to try in the afternoon. Will f/u as able.  Haley Singh 02/26/2021, 10:21 AM

## 2021-02-26 NOTE — Progress Notes (Signed)
PROGRESS NOTE    Haley Singh  GNF:[id number] DOB: Mar 11, 1951 DOA: 02/18/2021 PCP: Macy Mis, MD   Chief Complain: Intractable nausea/vomiting  Brief Narrative: Haley Singh is a 70 y.o. 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  On presentation, lab work showed potassium of 2.5.  CT abdomen/pelvis showed 5 mm partially obstructive stone in the left ureter.  Urology consulted and she underwent  cystoscopy with left ureteroscopy/laser lithotripsy/ureteral stent placement on 02/19/21.  MRI of the lumbar spine showed new severe discitis/osteomyelitis of L2-L3.  ID, neurosurgery consulted.  Neurosurgery not recommending surgical option.  Underwent IR guided  disc aspiration on 9.21.22, cultures have not shown any growth.  ID recommended to continue cefazolin till November 2.  PT recommended skilled nursing facility on discharge.  Medically stable for discharge, she will be discharged on Monday.   Assessment & Plan:   Principal Problem:   Epidural abscess Active Problems:   Mild persistent asthma without complication   Discitis of lumbar region   Intractable nausea and vomiting   Hypokalemia   Vertebral osteomyelitis (HCC)   Penicillin allergy   New severe discitis/osteomyelitis of L2-L3/Recent history of MSSA bacteremia/L4-L5 vertebral osteomyelitis/discitis: Hospitalized for this on August this year.  TEE was negative for vegetation.  She is supposed to be on cefazolin 2 g 3 times a day till 10/5 .  She follows with ID as an outpatient .CT abdomen/pelvis done during this hospitalization showed PROGRESSIVE OSTEOMYELITIS with interval destruction of the endplates at L2-L3 compared to CT 01/23/2021.  We ordered MRI of the lumbar spine with contrast for further evaluation and it showed  severe discitis/osteomyelitis at L2-L3  with enhancing epidural phlegmon spanning from L1-L2 to L4-L5 (up to 1 cm thick ventrally) and into bilateral L2-L3 foramina resulting severe canal stenosis at L2-L3 and moderate canal stenosis at the L2 and L3 levels.Also showed Surrounding paraspinal myositis and phlegmon with 8 mm abscess inthe right psoas at the L2-L3 level.We consulted  neurosurgery and ID . We have ordered blood cultures.  Underwent  Disc aspiration.  Aerobic/anaerobic  culture has not shown any organism.  ID recommending to continue same antibiotics till Nov 2. Neurosurgery not recommending surgical option .  She complains of persistent back pain but does not have significant focal neurological deficits. She complains of leg cramps/spasms: Continue Flexeril, Robaxin as needed  Intractable nausea/vomiting/loose stools:  Abdominal x-ray did not show any acute findings. CT abdomen/pelvis showed 5 mm partially obstructing stone in the left ureter.Consulted urology, discussed with Dr. Alvester Morin.  Could not find any significant etiology for intractable nausea and vomiting except for ureteral stone.Underwent  cystoscopy with left ureteroscopy/laser lithotripsy/ureteral stent placement.  Nausea/vomiting have resolved   Hypokalemia/hypomagnesemia: Continue to monitor and supplement   Bilateral renal calculi: As seen on abd xray,chronic findings.5 mm partially obstructing stone in the left ureter.management as above   Mild persistent asthma : Continue bronchodilators as needed.  On Singulair.  Currently not in exacerbation   Severe hypertension:  Continue PRN medications for severe hypertension.  On amlodipine and losartan at home which we will continued, we increased the dose of amlodipine and losartan.  Added hydralazine.BP stable now   History of neuropathy: On gabapentin   History of hyperlipidemia: On statin   History of depression: On Effexor, trazodone   Debility/deconditioning: Recently  discharged from skilled nursing  facility.  Looks significantly weak, lives alone.  Requested consultation for PT/OT, recommending SNF    Nutrition Problem: Increased nutrient needs Etiology: acute illness      DVT prophylaxis:Lovenox Code Status: Full Family Communication:None at bedside .  Patient does not have any family member Status is: Observation  Dispo: The patient is from: Home              Anticipated d/c is to: SNF on Monday              Patient currently is medically stable for discharge   Difficult to place patient No     Consultants: Urology  Procedures:As above  Antimicrobials:  Anti-infectives (From admission, onward)    Start     Dose/Rate Route Frequency Ordered Stop   02/21/21 1500  amoxicillin (AMOXIL) capsule 500 mg        500 mg Oral  Once 02/21/21 1405 02/21/21 1656   02/18/21 1800  ceFAZolin (ANCEF) IVPB 2g/100 mL premix        2 g 200 mL/hr over 30 Minutes Intravenous Every 8 hours 02/18/21 1718         Subjective:  Patient seen and examined at the bedside this morning.  Continues to complain of severe spasms/cramps on her bilateral legs including thighs.  Hemodynamically stable  Objective: Vitals:   02/25/21 0839 02/25/21 1456 02/25/21 2119 02/26/21 0532  BP:  (!) 102/54 135/70 118/66  Pulse:  86 90 79  Resp:  18 17 18   Temp:  98.1 F (36.7 C) 98.5 F (36.9 C) 98.4 F (36.9 C)  TempSrc:   Oral Oral  SpO2: 96% 98% 99% 97%  Weight:      Height:        Intake/Output Summary (Last 24 hours) at 02/26/2021 0804 Last data filed at 02/26/2021 0600 Gross per 24 hour  Intake 1160 ml  Output 1125 ml  Net 35 ml   Filed Weights   02/18/21 1712  Weight: 90.7 kg    Examination:  General exam: Overall comfortable, not in distress, weak HEENT: PERRL Respiratory system:  no wheezes or crackles  Cardiovascular system: S1 & S2 heard, RRR.  Gastrointestinal system: Abdomen is nondistended, soft and nontender. Central nervous system:  Alert and oriented Extremities: No edema, no clubbing ,no cyanosis Skin: No rashes, no ulcers,no icterus     Data Reviewed: I have personally reviewed following labs and imaging studies  CBC: Recent Labs  Lab 02/20/21 0242 02/22/21 0430  WBC 8.3 9.9  NEUTROABS 6.8 7.9*  HGB 11.3* 11.9*  HCT 35.0* 38.0  MCV 86.0 87.4  PLT 302 310   Basic Metabolic Panel: Recent Labs  Lab 02/19/21 1413 02/20/21 0242 02/21/21 0321 02/22/21 0430 02/25/21 0323  NA  --  136 131* 131*  --   K 3.9 3.5 3.1* 3.5  --   CL  --  101 97* 98  --   CO2  --  25 25 26   --   GLUCOSE  --  164* 140* 110*  --   BUN  --  7* 8 11  --   CREATININE  --  0.45 0.52 0.54 0.69  CALCIUM  --  8.5* 8.0* 8.1*  --   MG  --  1.6* 2.1  --   --    GFR: Estimated Creatinine Clearance: 72.1 mL/min (by C-G formula based on SCr of 0.69 mg/dL). Liver Function Tests: No results for input(s): AST, ALT, ALKPHOS, BILITOT, PROT, ALBUMIN in the last 168  hours.  No results for input(s): LIPASE, AMYLASE in the last 168 hours.  No results for input(s): AMMONIA in the last 168 hours. Coagulation Profile: Recent Labs  Lab 02/22/21 0430  INR 2.1*   Cardiac Enzymes: No results for input(s): CKTOTAL, CKMB, CKMBINDEX, TROPONINI in the last 168 hours. BNP (last 3 results) No results for input(s): PROBNP in the last 8760 hours. HbA1C: No results for input(s): HGBA1C in the last 72 hours. CBG: No results for input(s): GLUCAP in the last 168 hours. Lipid Profile: No results for input(s): CHOL, HDL, LDLCALC, TRIG, CHOLHDL, LDLDIRECT in the last 72 hours. Thyroid Function Tests: No results for input(s): TSH, T4TOTAL, FREET4, T3FREE, THYROIDAB in the last 72 hours. Anemia Panel: No results for input(s): VITAMINB12, FOLATE, FERRITIN, TIBC, IRON, RETICCTPCT in the last 72 hours. Sepsis Labs: No results for input(s): PROCALCITON, LATICACIDVEN in the last 168 hours.  Recent Results (from the past 240 hour(s))  Resp Panel by RT-PCR  (Flu A&B, Covid) Nasopharyngeal Swab     Status: None   Collection Time: 02/18/21  6:15 PM   Specimen: Nasopharyngeal Swab; Nasopharyngeal(NP) swabs in vial transport medium  Result Value Ref Range Status   SARS Coronavirus 2 by RT PCR NEGATIVE NEGATIVE Final    Comment: (NOTE) SARS-CoV-2 target nucleic acids are NOT DETECTED.  The SARS-CoV-2 RNA is generally detectable in upper respiratory specimens during the acute phase of infection. The lowest concentration of SARS-CoV-2 viral copies this assay can detect is 138 copies/mL. A negative result does not preclude SARS-Cov-2 infection and should not be used as the sole basis for treatment or other patient management decisions. A negative result may occur with  improper specimen collection/handling, submission of specimen other than nasopharyngeal swab, presence of viral mutation(s) within the areas targeted by this assay, and inadequate number of viral copies(<138 copies/mL). A negative result must be combined with clinical observations, patient history, and epidemiological information. The expected result is Negative.  Fact Sheet for Patients:  BloggerCourse.com  Fact Sheet for Healthcare Providers:  SeriousBroker.it  This test is no t yet approved or cleared by the [place] FDA and  has been authorized for detection and/or diagnosis of SARS-CoV-2 by FDA under an Emergency Use Authorization (EUA). This EUA will remain  in effect (meaning this test can be used) for the duration of the COVID-19 declaration under Section 564(b)(1) of the Act, 21 U.S.C.section 360bbb-3(b)(1), unless the authorization is terminated  or revoked sooner.       Influenza A by PCR NEGATIVE NEGATIVE Final   Influenza B by PCR NEGATIVE NEGATIVE Final    Comment: (NOTE) The Xpert Xpress SARS-CoV-2/FLU/RSV plus assay is intended as an aid in the diagnosis of influenza from Nasopharyngeal swab specimens  and should not be used as a sole basis for treatment. Nasal washings and aspirates are unacceptable for Xpert Xpress SARS-CoV-2/FLU/RSV testing.  Fact Sheet for Patients: BloggerCourse.com  Fact Sheet for Healthcare Providers: SeriousBroker.it  This test is not yet approved or cleared by the [place] FDA and has been authorized for detection and/or diagnosis of SARS-CoV-2 by FDA under an Emergency Use Authorization (EUA). This EUA will remain in effect (meaning this test can be used) for the duration of the COVID-19 declaration under Section 564(b)(1) of the Act, 21 U.S.C. section 360bbb-3(b)(1), unless the authorization is terminated or revoked.  Performed at [facility], [address], [place], [place] [address]   Culture, blood (routine x 2)     Status: None (Preliminary  result)   Collection Time: 02/22/21 12:28 PM   Specimen: BLOOD  Result Value Ref Range Status   Specimen Description   Final    BLOOD BLOOD LEFT HAND Performed at [facility], [address] [place], [place] [address]    Special Requests   Final    BOTTLES DRAWN AEROBIC AND ANAEROBIC Blood Culture adequate volume Performed at [facility], [address] [place], [place] [address]    Culture   Final    NO GROWTH 3 DAYS Performed at [facility], [address], [place], [place] [address]    Report Status PENDING  Incomplete  Culture, blood (routine x 2)     Status: None (Preliminary result)   Collection Time: 02/22/21 12:28 PM   Specimen: BLOOD  Result Value Ref Range Status   Specimen Description   Final    BLOOD LEFT ANTECUBITAL Performed at [facility], [address], [place], [place] [address]    Special Requests   Final    BOTTLES DRAWN AEROBIC AND ANAEROBIC Blood Culture adequate volume Performed at [facility], [address], [place], [place] [address]    Culture   Final    NO GROWTH 3 DAYS Performed at [facility], [address], [place], [place] [address]    Report Status PENDING  Incomplete  Aerobic/Anaerobic Culture w Gram Stain (surgical/deep wound)     Status: None (Preliminary result)   Collection Time: 02/22/21  6:00 PM   Specimen: Body Fluid  Result Value Ref Range Status   Specimen Description   Final    FLUID INTERVERTEBRAL DISC Performed at [facility], [address], [place], [place] [address]    Special Requests   Final    NONE Performed at [facility], [address] [place], [place] [address]    Gram Stain   Final    FEW SQUAMOUS EPITHELIAL CELLS PRESENT MODERATE WBC PRESENT, PREDOMINANTLY PMN NO ORGANISMS SEEN    Culture   Final    NO GROWTH 2 DAYS NO ANAEROBES ISOLATED; CULTURE IN PROGRESS FOR 5 DAYS Performed at [facility], [address] [place], [place] [address]    Report Status PENDING  Incomplete         Radiology Studies: No results found.      Scheduled Meds:  alteplase  2 mg Intracatheter Once   amLODipine  10 mg Oral Daily   atorvastatin  40 mg Oral Daily   Chlorhexidine Gluconate Cloth  6 each Topical Daily   enoxaparin (LOVENOX) injection  40 mg Subcutaneous Q24H   feeding supplement  1 Container Oral BID BM   fluticasone  1 spray Each Nare BID   fluticasone furoate-vilanterol  1 puff Inhalation q morning   gabapentin  600 mg Oral QHS   hydrALAZINE  25 mg Oral Q8H   losartan  100 mg Oral Daily   montelukast  10 mg Oral Daily   multivitamin with minerals  1 tablet Oral Daily   senna  1 tablet Oral BID   sodium chloride flush  10-40 mL Intracatheter Q12H   traZODone  150 mg Oral QHS   venlafaxine XR  150 mg Oral q morning   Continuous Infusions:   ceFAZolin (ANCEF) IV 2 g (02/26/21 0118)     LOS: 5 days    Time spent: 25 mins.More than 50% of that time was spent in counseling and/or  coordination of care.      Burnadette Pop, MD [facility] P9/25/2022, 8:04 AM

## 2021-02-26 NOTE — TOC Transition Note (Signed)
Transition of Care [facility]) - CM/SW Discharge Note   Patient Details  Name: TANYA CROTHERS MRN: [id number] Date of Birth: Jan 24, 1951  Transition of Care [facility]) CM/SW Contact:  Lynx Goodrich, Vinnie Langton, LCSW Phone Number: 02/26/2021, 11:02 AM   Clinical Narrative:     Patient is still not medically stable and ready for discharge today, but has a tentative discharge scheduled for Monday (02/27/2021).  Patient has chosen to return to [facility] for short-term rehabilitative services.  LCSW requested Rapid COVID Screening.     Barriers to Discharge: Continued Medical Work up  N/A  Patient Goals and CMS Choice  Patient states their goals for this hospitalization and ongoing recovery are:: to go home but now feels she needs SNF again for rehab      Discharge Placement   [facility].   Discharge Plan and Services   [facility] for short-term rehabilitative services.  Social Determinants of Health (SDOH) Interventions   N/A  Readmission Risk Interventions Readmission Risk Prevention Plan 01/22/2021  Medication Screening Complete  Transportation Screening Complete  Some recent data might be hidden    Danford Bad, BSW, MSW, [employer]  Licensed Clinical Social Worker  [employer]  Mailing Address-[address] [place], [place] [address] Physical Address-[address], [place], [place] [address] Toll Free Main # [phone] Fax # [phone] Cell # [phone]  [email or web address]

## 2021-02-26 NOTE — Progress Notes (Signed)
Occupational Therapy Treatment Patient Details Name: Haley Singh MRN: [id number] DOB: [birth year] Today's Date: 02/26/2021   History of present illness Patient is a 70 year old female who presented with intractable nausea and vomitting. patient was found to have left ureteral stone. patient underwent a ureteral stent placement on 9/18. Pt also with recent discitis/osteomyelitis of spine and still on antibiotics at home.  However, New MRI this admission showed new severe discitis/osteomyelitis of L2-L3.  Had disc aspiration on 02/22/21. PMH: MDD, OA, chronic back pain, HTN, HLD, asthma, depression, anxiety, insomnia, R ankle fx, septic arthritis of spine   OT comments  Treatment focused on promoting OOB activity and activity tolerance. Patient had a set back in regards to ability to perform functional mobility and ADLs due to recent onset of increased pain and spasms. Currently patient min guard for transfer out of bed and ambulation. Needing total assist for LB ADLs. Patient's movements painstakingly slow so as not to elicit any muscle spams. Changed recommendation to short term rehab at discharge.   Recommendations for follow up therapy are one component of a multi-disciplinary discharge planning process, led by the attending physician.  Recommendations may be updated based on patient status, additional functional criteria and insurance authorization.    Follow Up Recommendations  SNF    Equipment Recommendations  None recommended by OT    Recommendations for Other Services      Precautions / Restrictions Precautions Precautions: Fall;Back Precaution Comments: PICC line Required Braces or Orthoses: Spinal Brace Spinal Brace: Lumbar corset;Applied in sitting position       Mobility Bed Mobility Overal bed mobility: Needs Assistance Bed Mobility: Supine to Sit     Supine to sit: Supervision;HOB elevated     General bed mobility comments: Patient positioned HOB as far as it would  go and painstakingly slow transferred herself to the side of the bed. Wouldn't let therapist help or touch - as she didn't want to elecit any spasms.    Transfers Overall transfer level: Needs assistance Equipment used: Rolling walker (2 wheeled) Transfers: Sit to/from Stand Sit to Stand: Min guard;From elevated surface         General transfer comment: Min guard with RW to stand from high bed. Min guard to walk around bed to recliner.    Balance Overall balance assessment: Mild deficits observed, not formally tested                                         ADL either performed or assessed with clinical judgement   ADL                                               Vision Patient Visual Report: No change from baseline     Perception     Praxis      Cognition Arousal/Alertness: Awake/alert Behavior During Therapy: WFL for tasks assessed/performed Overall Cognitive Status: Within Functional Limits for tasks assessed                                          Exercises     Shoulder Instructions       General Comments  Pertinent Vitals/ Pain       Pain Assessment: 0-10 Pain Score: 6  Pain Location: back and LLE exacerbated with activity Pain Descriptors / Indicators: Guarding Pain Intervention(s): Limited activity within patient's tolerance;Monitored during session;Premedicated before session  Home Living                                          Prior Functioning/Environment              Frequency  Min 2X/week        Progress Toward Goals  OT Goals(current goals can now be found in the care plan section)  Progress towards OT goals: Not progressing toward goals - comment (increased pain and muscle spasms limiting patient)  Acute Rehab OT Goals Patient Stated Goal: Decrease pain and improve independence OT Goal Formulation: With patient Time For Goal Achievement:  03/06/21 Potential to Achieve Goals: Good  Plan Discharge plan needs to be updated    Co-evaluation                 AM-PAC OT "6 Clicks" Daily Activity     Outcome Measure   Help from another person eating meals?: None Help from another person taking care of personal grooming?: None Help from another person toileting, which includes using toliet, bedpan, or urinal?: Total Help from another person bathing (including washing, rinsing, drying)?: A Lot Help from another person to put on and taking off regular upper body clothing?: A Lot Help from another person to put on and taking off regular lower body clothing?: Total 6 Click Score: 14    End of Session Equipment Utilized During Treatment: Rolling walker  OT Visit Diagnosis: Unsteadiness on feet (R26.81);Muscle weakness (generalized) (M62.81);Pain   Activity Tolerance Patient tolerated treatment well   Patient Left with nursing/sitter in room;in chair;with chair alarm set   Nurse Communication Mobility status        Time: 1420-1446 OT Time Calculation (min): 26 min  Charges: OT General Charges $OT Visit: 1 Visit OT Treatments $Therapeutic Activity: 23-37 mins  Aryonna Gunnerson, OTR/L Acute Care Rehab Services  Office [phone] Pager: [phone]   Kelli Churn 02/26/2021, 4:33 PM

## 2021-02-27 DIAGNOSIS — G062 Extradural and subdural abscess, unspecified: Secondary | ICD-10-CM | POA: Diagnosis not present

## 2021-02-27 LAB — BASIC METABOLIC PANEL
Anion gap: 7 (ref 5–15)
BUN: 10 mg/dL (ref 8–23)
CO2: 25 mmol/L (ref 22–32)
Calcium: 8.4 mg/dL — ABNORMAL LOW (ref 8.9–10.3)
Chloride: 104 mmol/L (ref 98–111)
Creatinine, Ser: 0.7 mg/dL (ref 0.44–1.00)
GFR, Estimated: >60 mL/min (ref >60)
Glucose, Bld: 99 mg/dL (ref 70–99)
Potassium: 2.8 mmol/L — ABNORMAL LOW (ref 3.5–5.1)
Sodium: 136 mmol/L (ref 135–145)

## 2021-02-27 LAB — CULTURE, BLOOD (ROUTINE X 2)
Culture: NO GROWTH
Culture: NO GROWTH
Special Requests: ADEQUATE
Special Requests: ADEQUATE

## 2021-02-27 LAB — MAGNESIUM: Magnesium: 1.8 mg/dL (ref 1.7–2.4)

## 2021-02-27 MED ORDER — AMLODIPINE BESYLATE 10 MG PO TABS: 10.0000 mg | ORAL_TABLET | Freq: Every day | ORAL | Status: DC

## 2021-02-27 MED ORDER — POTASSIUM CHLORIDE CRYS ER 20 MEQ PO TBCR: 40.0000 meq | EXTENDED_RELEASE_TABLET | Freq: Every day | ORAL | 0 refills | Status: DC

## 2021-02-27 MED ORDER — BACLOFEN 10 MG PO TABS: 10.0000 mg | ORAL_TABLET | Freq: Three times a day (TID) | ORAL | 0 refills | Status: DC | PRN

## 2021-02-27 MED ORDER — CEFAZOLIN IV (FOR PTA / DISCHARGE USE ONLY): 2.0000 g | Freq: Three times a day (TID) | INTRAVENOUS | 0 refills | Status: DC

## 2021-02-27 MED ORDER — OXYCODONE-ACETAMINOPHEN 5-325 MG PO TABS: 1.0000 | ORAL_TABLET | Freq: Four times a day (QID) | ORAL | 0 refills | Status: DC | PRN

## 2021-02-27 MED ORDER — LOSARTAN POTASSIUM 100 MG PO TABS: 100.0000 mg | ORAL_TABLET | Freq: Every day | ORAL | Status: AC

## 2021-02-27 MED ORDER — POTASSIUM CHLORIDE CRYS ER 20 MEQ PO TBCR
40.0000 meq | EXTENDED_RELEASE_TABLET | ORAL | Status: AC
  Administered 2021-02-27 (×2): 40 meq via ORAL
  Filled 2021-02-27 (×2): qty 2

## 2021-02-27 MED ORDER — POTASSIUM CHLORIDE 10 MEQ/100ML IV SOLN
10.0000 meq | INTRAVENOUS | Status: AC
  Administered 2021-02-27 (×3): 10 meq via INTRAVENOUS
  Filled 2021-02-27 (×3): qty 100

## 2021-02-27 MED ORDER — HYDRALAZINE HCL 25 MG PO TABS: 25.0000 mg | ORAL_TABLET | Freq: Three times a day (TID) | ORAL | Status: AC

## 2021-02-27 NOTE — Progress Notes (Signed)
Physical Therapy Treatment Patient Details Name: Haley Singh MRN: [id number] DOB: November 20, 1950 Today's Date: 02/27/2021   History of Present Illness Patient is a 70 year old female who presented with intractable nausea and vomitting. patient was found to have left ureteral stone. patient underwent a ureteral stent placement on 9/18. Pt also with recent discitis/osteomyelitis of spine and still on antibiotics at home.  However, New MRI this admission showed new severe discitis/osteomyelitis of L2-L3.  Had disc aspiration on 02/22/21. PMH: MDD, OA, chronic back pain, HTN, HLD, asthma, depression, anxiety, insomnia, R ankle fx, septic arthritis of spine    PT Comments    Pt with significantly limited mobility d/t  pain and muscle spasms in LEs. Pt with repeated bil knee buckling and inability to amb more than a few ft safely. Assisted back to bed, pt declines being in recliner d/t it not being "comfortable". Continue to recommend SNF d/t pt decline in functional status.  Recommendations for follow up therapy are one component of a multi-disciplinary discharge planning process, led by the attending physician.  Recommendations may be updated based on patient status, additional functional criteria and insurance authorization.  Follow Up Recommendations  SNF     Equipment Recommendations  Rolling walker with 5" wheels;Hospital bed    Recommendations for Other Services       Precautions / Restrictions Precautions Precautions: Fall;Back Precaution Comments: PICC line Required Braces or Orthoses: Spinal Brace Spinal Brace: Lumbar corset;Applied in sitting position Restrictions Weight Bearing Restrictions: No     Mobility  Bed Mobility Overal bed mobility: Needs Assistance Bed Mobility: Supine to Sit;Sit to Supine     Supine to sit: Supervision;HOB elevated Sit to supine: Mod assist;HOB elevated   General bed mobility comments: Patient positioned HOB as far as it would go and  slowly   transferred herself to the side of the bed. Wouldn't let therapist help or touch - as she didn't want to ilicit any spasms. required mod assist to elevate bil LEs on to bed, incr time needed for both transitions d/t pain/spasms    Transfers Overall transfer level: Needs assistance Equipment used: Rolling walker (2 wheeled) Transfers: Sit to/from Stand Sit to Stand: Min guard;From elevated surface;Min assist         General transfer comment: pt requests bed ht up and down for sit> stand and stand>sit transitions. requires incr time. pt with LE spasms and knees buckling. repeated trials for pad change on bed  Ambulation/Gait Ambulation/Gait assistance: Min assist;Mod assist Gait Distance (Feet): 5 Feet Assistive device: Rolling walker (2 wheeled) Gait Pattern/deviations: Step-to pattern;Trunk flexed     General Gait Details: limited distance d/t LE muscle spasms and knees buckling   Stairs             Wheelchair Mobility    Modified Rankin (Stroke Patients Only)       Balance             Standing balance-Leahy Scale: Poor Standing balance comment: heavily reliant on UEs                            Cognition Arousal/Alertness: Awake/alert Behavior During Therapy: WFL for tasks assessed/performed Overall Cognitive Status: Within Functional Limits for tasks assessed  Exercises      General Comments        Pertinent Vitals/Pain Pain Assessment: Faces Faces Pain Scale: Hurts even more Pain Location: back and bil LEs, exacerbated with activity Pain Descriptors / Indicators: Guarding;Spasm Pain Intervention(s): Limited activity within patient's tolerance;Monitored during session;Premedicated before session;Repositioned;Heat applied    Home Living                      Prior Function            PT Goals (current goals can now be found in the care plan section) Acute Rehab PT  Goals Patient Stated Goal: Decrease pain and improve independence PT Goal Formulation: With patient Time For Goal Achievement: 03/06/21 Potential to Achieve Goals: Fair Progress towards PT goals: Progressing toward goals    Frequency    Min 2X/week      PT Plan Discharge plan needs to be updated;Frequency needs to be updated    Co-evaluation              AM-PAC PT "6 Clicks" Mobility   Outcome Measure  Help needed turning from your back to your side while in a flat bed without using bedrails?: A Lot Help needed moving from lying on your back to sitting on the side of a flat bed without using bedrails?: A Lot Help needed moving to and from a bed to a chair (including a wheelchair)?: A Lot Help needed standing up from a chair using your arms (e.g., wheelchair or bedside chair)?: A Lot Help needed to walk in hospital room?: A Lot Help needed climbing 3-5 steps with a railing? : Total 6 Click Score: 11    End of Session Equipment Utilized During Treatment: Gait belt Activity Tolerance: Patient limited by pain Patient left: with call bell/phone within reach;in bed;with bed alarm set Nurse Communication: Mobility status PT Visit Diagnosis: Other abnormalities of gait and mobility (R26.89);Pain Pain - Right/Left:  (R and L) Pain - part of body: Leg (and back)     Time: 1010-1034 PT Time Calculation (min) (ACUTE ONLY): 24 min  Charges:  $Therapeutic Activity: 23-37 mins                      Delice Bison, PT  Acute Rehab Dept ([facility]) [phone] Pager [phone]  02/27/2021    [facility] 02/27/2021, 11:01 AM

## 2021-02-27 NOTE — TOC Progression Note (Signed)
Transition of Care [facility]) - Progression Note    Patient Details  Name: Haley Singh MRN: [id number] Date of Birth: 02-Dec-1950  [facility] CM/SW Contact  Amada Jupiter, LCSW Phone Number: 02/27/2021, 3:42 PM  Clinical Narrative:    Pt dc delayed due to PASRR review needed.  Completing this afternoon and pt should be cleared to dc to [facility].   Expected Discharge Plan: Skilled Nursing Facility Barriers to Discharge: Continued Medical Work up  Expected Discharge Plan and Services Expected Discharge Plan: Skilled Nursing Facility         Expected Discharge Date: 02/27/21                                     Social Determinants of Health (SDOH) Interventions    Readmission Risk Interventions Readmission Risk Prevention Plan 01/22/2021  Medication Screening Complete  Transportation Screening Complete  Some recent data might be hidden

## 2021-02-27 NOTE — Discharge Summary (Addendum)
Physician Discharge Summary  Haley Singh TJQ:[id number] DOB: [birth year] DOA: 02/18/2021  PCP: Katherina Mires, MD  Admit date: 02/18/2021 Discharge date: 02/28/2021  Admitted From: Home Disposition:  Home  Discharge Condition:Stable CODE STATUS:DNR Diet recommendation: Heart Healthy   Brief/Interim Summary:  Haley Singh is a 70 y.o. 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  On presentation, lab work showed potassium of 2.5.  CT abdomen/pelvis showed 5 mm partially obstructive stone in the left ureter.  Urology consulted and she underwent  cystoscopy with left ureteroscopy/laser lithotripsy/ureteral stent placement on 02/19/21.  MRI of the lumbar spine showed new severe discitis/osteomyelitis of L2-L3.  ID, neurosurgery consulted.  Neurosurgery not recommending surgical option.  Underwent IR guided  disc aspiration on 9.21.22, cultures have not shown any growth.  ID recommended to continue cefazolin till November 2.  PT recommended skilled nursing facility on discharge.  Medically stable for discharge.  Following problems were addressed during her hospitalization:   New severe discitis/osteomyelitis of L2-L3/Recent history of MSSA bacteremia/L4-L5 vertebral osteomyelitis/discitis: Hospitalized for this on August this year.  TEE was negative for vegetation.  She was supposed to be on cefazolin 2 g 3 times a day till 10/5 .CT abdomen/pelvis done during this hospitalization showed PROGRESSIVE OSTEOMYELITIS with interval destruction of the endplates at T6-A2 compared to CT 01/23/2021.  We ordered MRI of the lumbar spine with contrast for further evaluation and it showed severe discitis/osteomyelitis at L2-L3  with enhancing epidural phlegmon spanning from L1-L2 to L4-L5 (up to 1 cm thick  ventrally) and into bilateral L2-L3 foramina resulting severe canal stenosis at L2-L3 and moderate canal stenosis at the L2 and L3 levels.Also showed Surrounding paraspinal myositis and phlegmon with 8 mm abscess inthe right psoas at the L2-L3 level.We consulted  neurosurgery and ID . Underwent  Disc aspiration.  Aerobic/anaerobic  culture has not shown any organism.  Blood culture did not show any growth.  ID recommending to continue same antibiotics till Nov 2. Neurosurgery not recommending surgical option .  She complains of persistent back pain but does not have significant focal neurological deficits.   Intractable nausea/vomiting/loose stools:  Abdominal x-ray did not show any acute findings. CT abdomen/pelvis showed 5 mm partially obstructing stone in the left ureter.Consulted urology, discussed with Dr. Gloriann Loan.  Could not find any significant etiology for intractable nausea and vomiting except for ureteral stone.Underwent  cystoscopy with left ureteroscopy/laser lithotripsy/ureteral stent placement.  Nausea/vomiting have resolved   Hypokalemia/hypomagnesemia: Supplemented aggressively.  Check BMP in a week    Bilateral renal calculi: As seen on abd xray,chronic findings.5 mm partially obstructing stone in the left ureter.management as above   Mild persistent asthma :   On Singulair.  Currently not in exacerbation.  Continue home inhalers   Severe hypertension:    On amlodipine and losartan at home which we will continued, we increased the dose of amlodipine and losartan.  Added hydralazine.BP stable now   History of neuropathy: On gabapentin   History of hyperlipidemia: On statin   History of depression: On Effexor, trazodone   Debility/deconditioning: Recently discharged from skilled nursing facility.  Looks significantly weak, lives alone.  Requested consultation for PT/OT, recommending SNF       Discharge Diagnoses:  Principal Problem:   Epidural abscess Active Problems:   Mild  persistent asthma without complication   Discitis of  lumbar region   Intractable nausea and vomiting   Hypokalemia   Vertebral osteomyelitis (HCC)   Penicillin allergy    Discharge Instructions  Discharge Instructions     [facility] pharmacist to adjust dose for Vancomycin, Aminoglycosides and other anti-infective therapies as requested by physician.   Complete by: As directed    [facility] to provide Cath Flo 557m   Complete by: As directed    Administer for PICC line occlusion and as ordered by physician for other access device issues.   Anaphylaxis Kit: Provided to treat any anaphylactic reaction to the medication being provided to the patient if First Dose or when requested by physician   Complete by: As directed    Epinephrine 188mml vial / amp: Administer 0.57m48m0.57ml1mubcutaneously once for moderate to severe anaphylaxis, nurse to call physician and pharmacy when reaction occurs and call 911 if needed for immediate care   Diphenhydramine 50mg61mIV vial: Administer 25-50mg 75mM PRN for first dose reaction, rash, itching, mild reaction, nurse to call physician and pharmacy when reaction occurs   Sodium Chloride 0.9% NS 500ml I22mdminister if needed for hypovolemic blood pressure drop or as ordered by physician after call to physician with anaphylactic reaction   Change dressing on IV access line weekly and PRN   Complete by: As directed    Diet - low sodium heart healthy   Complete by: As directed    Discharge instructions   Complete by: As directed    1)Please follow-up with infectious disease on 03/10/2021 at 11: 30 AM Do weekly CBC,CMP and CRP and fax  labs to [phone] 3)Take prescribed medications as instructed   Flush IV access with Sodium Chloride 0.9% and Heparin 10 units/ml or 100 units/ml   Complete by: As directed    Home infusion instructions - [facility]   Complete by: As directed    Instructions: Flush IV access with  Sodium Chloride 0.9% and Heparin 10units/ml or 100units/ml   Change dressing on IV access line: Weekly and PRN   Instructions Cath Flo 2mg: Ad30mister for PICC Line occlusion and as ordered by physician for other access device   [facility] pharmacist to adjust dose for: Vancomycin, Aminoglycosides and other anti-infective therapies as requested by physician   Increase activity slowly   Complete by: As directed    Method of administration may be changed at the discretion of home infusion pharmacist based upon assessment of the patient and/or caregiver's ability to self-administer the medication ordered   Complete by: As directed    No wound care   Complete by: As directed    Outpatient Parenteral Antibiotic Therapy Information Antibiotic: Cefazolin (Ancef) IVPB; Indications for use: vertebral osteomyelitis; End Date: 04/05/2021   Complete by: As directed    Antibiotic: Cefazolin (Ancef) IVPB   Indications for use: vertebral osteomyelitis   End Date: 04/05/2021      Allergies as of 02/28/2021       Reactions   Oxycontin [oxycodone] Itching   Tetracyclines & Related Itching        Medication List     STOP taking these medications    celecoxib 200 MG capsule Commonly known as: CELEBREX   lidocaine 5 % Commonly known as: LIDODERM   oxyCODONE 15 mg 12 hr tablet Commonly known as: OXYCONTIN       TAKE these medications    amLODipine 10 MG tablet Commonly known as: NORVASC Take 1 tablet (10 mg total) by  mouth daily. What changed:  medication strength how much to take   atorvastatin 40 MG tablet Commonly known as: LIPITOR Take 40 mg by mouth daily.   baclofen 10 MG tablet Commonly known as: LIORESAL Take 1 tablet (10 mg total) by mouth every 8 (eight) hours as needed for muscle spasms. What changed:  when to take this reasons to take this   Breo Ellipta 100-25 MCG/INH Aepb Generic drug: fluticasone furoate-vilanterol INHALE 1 PUFFS INTO THE LUNGS ONCE  DAILY What changed: See the new instructions.   ceFAZolin  IVPB Commonly known as: ANCEF Inject 2 g into the vein every 8 (eight) hours. Indication:  vertebral osteomyelitis / discitis First Dose: No Last Day of Therapy:  04/05/21 Labs - Once weekly:  CBC/D and BMP, Labs - Every other week:  ESR and CRP Method of administration: IV Push Method of administration may be changed at the discretion of home infusion pharmacist based upon assessment of the patient and/or caregiver's ability to self-administer the medication ordered. What changed: additional instructions   cetirizine 10 MG tablet Commonly known as: ZYRTEC Take 10 mg by mouth every morning.   fluticasone 50 MCG/ACT nasal spray Commonly known as: FLONASE Place 1 spray into both nostrils 2 (two) times daily.   gabapentin 300 MG capsule Commonly known as: NEURONTIN Take 600 mg by mouth at bedtime.   hydrALAZINE 25 MG tablet Commonly known as: APRESOLINE Take 1 tablet (25 mg total) by mouth every 8 (eight) hours.   losartan 100 MG tablet Commonly known as: COZAAR Take 1 tablet (100 mg total) by mouth daily. What changed:  medication strength how much to take   methocarbamol 500 MG tablet Commonly known as: Robaxin Take 1 tablet (500 mg total) by mouth every 6 (six) hours as needed for muscle spasms.   montelukast 10 MG tablet Commonly known as: SINGULAIR Take 10 mg by mouth daily.   oxyCODONE-acetaminophen 5-325 MG tablet Commonly known as: PERCOCET/ROXICET Take 1 tablet by mouth every 6 (six) hours as needed for severe pain.   polyethylene glycol 17 g packet Commonly known as: MIRALAX / GLYCOLAX Take 17 g by mouth daily as needed.   potassium chloride SA 20 MEQ tablet Commonly known as: KLOR-CON Take 2 tablets (40 mEq total) by mouth daily for 7 days.   promethazine 12.5 MG tablet Commonly known as: PHENERGAN Take 12.5 mg by mouth every 6 (six) hours as needed for nausea or vomiting.   senna-docusate  8.6-50 MG tablet Commonly known as: Senokot-S Take 1 tablet by mouth 2 (two) times daily.   SUMAtriptan 50 MG tablet Commonly known as: IMITREX Take 50 mg by mouth See admin instructions. Take one tablet (50 mg) by mouth at onset of migraine headache, may repeat 1 dose in an hour.   traZODone 150 MG tablet Commonly known as: DESYREL Take 150 mg by mouth at bedtime.   venlafaxine XR 150 MG 24 hr capsule Commonly known as: EFFEXOR-XR Take 150 mg by mouth every morning.               Durable Medical Equipment  (From admission, onward)           Start     Ordered   02/24/21 1248  For home use only DME Hospital bed  Once       Question Answer Comment  Length of Need Lifetime   Bed type Semi-electric      02/24/21 1247  Discharge Care Instructions  (From admission, onward)           Start     Ordered   02/27/21 0000  Change dressing on IV access line weekly and PRN  (Home infusion instructions - [facility] )        02/27/21 0858            Follow-up Information     Vu, Rockey Situ, MD. Go in 1 week(s).   Specialty: Infectious Diseases Why: You have an appointment with Dr. Gale Journey on 03/10/2021 at 11:30 AM Contact information: [address] [place] [address] [phone]                Allergies  Allergen Reactions   Oxycontin [Oxycodone] Itching   Tetracyclines & Related Itching    Consultations: Neurosurgery, ID   Procedures/Studies: MR Lumbar Spine W Wo Contrast  Result Date: 02/20/2021 CLINICAL DATA:  Intervertebral disc disorder EXAM: MRI LUMBAR SPINE WITHOUT AND WITH CONTRAST TECHNIQUE: Multiplanar and multiecho pulse sequences of the lumbar spine were obtained without and with intravenous contrast. CONTRAST:  12m GADAVIST GADOBUTROL 1 MMOL/ML IV SOLN COMPARISON:  MRI lumbar spine 01/17/2021. FINDINGS: Segmentation:  Standard. Alignment:  Similar 6 mm of retrolisthesis of L1 on L2. Vertebrae:  New extensive edema within the L2-L3 disc with erosive change of the endplates and extensive marrow edema and enhancement of the L2 and L3 vertebral bodies, compatible with severe discitis/osteomyelitis. Conus medullaris and cauda equina: Conus extends to the L2 level. Conus appears normal. Paraspinal and other soft tissues: At L2-L3, Surrounding edema and enhancement in the paraspinal soft tissues with 8 mm discrete fluid collection in the right psoas, compatible with phlegmon and intramuscular abscess. Mildly improved but persistent edema and enhancement about the left greater than right L4-L5 facet joints. Disc levels: T12-L1: No significant disc protrusion, foraminal stenosis, or canal stenosis. L1-L2: Similar 6 mm of retrolisthesis of L1 on L2. Posterior disc osteophyte complex with bilateral facet arthropathy. Resulting mild-to-moderate canal stenosis, similar versus slightly progressed. L2-L3: There is enhancement within the ventral epidural canal at L2, L2-L3, and L3 with extension into the foramina, compatible with epidural phlegmon. The enhancing epidural phlegmon measures up to 1 cm at the superior L3 level. Milder enhancement posteriorly. When superimposed on degenerative change, there is resulting severe canal stenosis at L2-L3. Moderate canal stenosis at the adjacent L2 and L3 levels. L3-L4: Posterior disc bulge with small central disc protrusion. Enhancing epidural phlegmon within the ventral canal, measuring up to 1 cm in thickness at the superior L3 level and 6 mm at the mid L3 level. Milder epidural enhancement posteriorly. Resulting moderate canal stenosis. Mild bilateral foraminal stenosis. L4-L5: Grade 1 anterolisthesis of L4 on L5, similar. Broad disc bulge and bilateral facet arthropathy. Similar moderate left mild right foraminal stenosis. No significant canal stenosis. L5-S1: Facet arthropathy and mild disc bulging without significant canal or foraminal stenosis. IMPRESSION: 1. Severe  discitis/osteomyelitis at L2-L3 (detailed above) with enhancing epidural phlegmon spanning from L1-L2 to L4-L5 (up to 1 cm thick ventrally) and into bilateral L2-L3 foramina. When superimposed on degenerative change, resulting severe canal stenosis at L2-L3 and moderate canal stenosis at the L2 and L3 levels. 2. Surrounding paraspinal myositis and phlegmon with 8 mm abscess in the right psoas at the L2-L3 level. 3. Mildly improved but persistent edema and enhancement about the left greater than right L4-L5 facet joints, possibly septic arthritis given the above findings. Electronically Signed   By:  Margaretha Sheffield M.D.   On: 02/20/2021 19:11   CT ABDOMEN PELVIS W CONTRAST  Result Date: 02/18/2021 CLINICAL DATA:  Nausea and vomiting EXAM: CT ABDOMEN AND PELVIS WITH CONTRAST TECHNIQUE: Multidetector CT imaging of the abdomen and pelvis was performed using the standard protocol following bolus administration of intravenous contrast. CONTRAST:  21m OMNIPAQUE IOHEXOL 350 MG/ML SOLN COMPARISON:  None. FINDINGS: Lower chest: Lung bases are clear. Hepatobiliary: No focal hepatic lesion. No biliary duct dilatation. Common bile duct is normal. Pancreas: Pancreas is normal. No ductal dilatation. No pancreatic inflammation. Spleen: Normal spleen Adrenals/urinary tract: Adrenal glands normal. Bilateral nonobstructing renal calculi. There is a 5 mm calculus in the mid LEFT ureter measuring 5 mm (image 52/series 2). This calculus is at the pelvic brim. No bladder calculi. Bilateral homogeneous low-density renal lesions. Stomach/Bowel: Stomach, small bowel, appendix, and cecum are normal. The colon and rectosigmoid colon are normal. Vascular/Lymphatic: Abdominal aorta is normal caliber. No periportal or retroperitoneal adenopathy. No pelvic adenopathy. Reproductive: Uterus and adnexa unremarkable. Other: None Musculoskeletal: Severe arthropathy at L1-L2 and L2-L3 with endplate destruction. Endplate erosion at LZ6-X0is  advanced from CT 01/23/2021. Increased paraspinal soft tissue at L2-L3 (image 35/2). Anterolisthesis of L4 on L5 is stable. IMPRESSION: 1. Partially obstructing calculus in the mid LEFT ureter. 2. Bilateral renal calculi. 3. PROGRESSIVE OSTEOMYELITIS with interval destruction of the endplates at LR6-E4compared to CT 01/23/2021. Recommend contrast MRI of the spine for evaluation of extent of disease. Electronically Signed   By: SSuzy BouchardM.D.   On: 02/18/2021 18:54   IR Fluoro Guide Ndl Plmt / BX  Result Date: 02/23/2021 INDICATION: 70year old female with severe discitis osteomyelitis at L2-L3. EXAM: Lumbar DISC ASPIRATION UNDER FLUOROSCOPY MEDICATIONS: Lidocaine 1% subcutaneous ANESTHESIA/SEDATION: Moderate (conscious) sedation was employed during this procedure. A total of Versed 2 mg and Fentanyl 100 mcg was administered intravenously. Moderate Sedation Time: 10 minutes. The patient's level of consciousness and vital signs were monitored continuously by radiology nursing throughout the procedure under my direct supervision. PROCEDURE: Informed written consent was obtained from the patient after a thorough discussion of the procedural risks, benefits and alternatives. All questions were addressed. Maximal Sterile Barrier Technique was utilized including caps, mask, sterile gowns, sterile gloves, sterile drape, hand hygiene and skin antiseptic. A timeout was performed prior to the initiation of the procedure. Cone beam CT of the lumbar spine was performed. The appropriate interspace was identified and targeted with needle entry guidance software. An appropriate skin entry site was determined. After local infiltration with 1% lidocaine, an 18 gauge trocar needle was advanced into the interspace from a left posterolateral extraforaminal approach. Needle tip position within the interspace confirmed on biplane images. Approximately 5 mL of sanguinopurulent fluid was aspirated, sent for the requested  laboratory studies. The patient tolerated the procedure well. FLUOROSCOPY TIME:  35 seconds, 70 mGy COMPLICATIONS: None immediate. IMPRESSION: Technically successful L2-L3 disc aspiration under fluoroscopy. DRuthann Cancer MD [facility] Electronically Signed   By: DRuthann CancerM.D.   On: 02/23/2021 07:39   DG ABD ACUTE 2+V W 1V CHEST  Result Date: 02/18/2021 CLINICAL DATA:  Nausea and vomiting with abdominal pain. EXAM: DG ABDOMEN ACUTE WITH 1 VIEW CHEST COMPARISON:  CT abdomen and pelvis 01/23/2021. chest x-ray 12/16/2020. FINDINGS: Right upper extremity PICC terminates over the mid SVC. There is some linear areas of scarring or atelectasis in the mid and lower lungs. There is no focal lung consolidation, pleural effusion or pneumothorax  identified. Bowel gas pattern is nonobstructive. Bowel anastomosis is seen in the right abdomen. Overall, there is a paucity of bowel gas. Stomach is nondilated. There is no free air under the diaphragm. Bilateral renal calculi are again noted. There is a calcified uterine fibroid in the pelvis, unchanged. Degenerative changes affect the spine and hips. IMPRESSION: 1. Nonobstructive, nonspecific bowel gas pattern. Overall, there is a paucity of bowel gas which can be seen with fluid-filled bowel. Please correlate clinically. 2. Bilateral renal calculi. 3. No acute cardiopulmonary process. Electronically Signed   By: Ronney Asters M.D.   On: 02/18/2021 16:27   DG C-Arm 1-60 Min-No Report  Result Date: 02/19/2021 Fluoroscopy was utilized by the requesting physician.  No radiographic interpretation.   [place] EKG SITE RITE  Result Date: 02/22/2021 If Site Rite image not attached, placement could not be confirmed due to current cardiac rhythm.     Subjective: Patient seen and examined at the bedside this morning.  Medically stable for discharge today.  Discharge Exam: Vitals:   02/28/21 0748 02/28/21 0750  BP:     Pulse:    Resp:    Temp:    SpO2: 97% 97%   Vitals:   02/27/21 2043 02/28/21 0545 02/28/21 0748 02/28/21 0750  BP: (!) 112/58 (!) 115/59    Pulse: 86 83    Resp: 16 16    Temp: 98.6 F (37 C) 98.2 F (36.8 C)    TempSrc: Oral Oral    SpO2: 98% 97% 97% 97%  Weight:      Height:        General: Pt is alert, awake, not in acute distress Cardiovascular: RRR, S1/S2 +, no rubs, no gallops Respiratory: CTA bilaterally, no wheezing, no rhonchi Abdominal: Soft, NT, ND, bowel sounds + Extremities: no edema, no cyanosis    The results of significant diagnostics from this hospitalization (including imaging, microbiology, ancillary and laboratory) are listed below for reference.     Microbiology: Recent Results (from the past 240 hour(s))  Resp Panel by RT-PCR (Flu A&B, Covid) Nasopharyngeal Swab     Status: None   Collection Time: 02/18/21  6:15 PM   Specimen: Nasopharyngeal Swab; Nasopharyngeal(NP) swabs in vial transport medium  Result Value Ref Range Status   SARS Coronavirus 2 by RT PCR NEGATIVE NEGATIVE Final    Comment: (NOTE) SARS-CoV-2 target nucleic acids are NOT DETECTED.  The SARS-CoV-2 RNA is generally detectable in upper respiratory specimens during the acute phase of infection. The lowest concentration of SARS-CoV-2 viral copies this assay can detect is 138 copies/mL. A negative result does not preclude SARS-Cov-2 infection and should not be used as the sole basis for treatment or other patient management decisions. A negative result may occur with  improper specimen collection/handling, submission of specimen other than nasopharyngeal swab, presence of viral mutation(s) within the areas targeted by this assay, and inadequate number of viral copies(<138 copies/mL). A negative result must be combined with clinical observations, patient history, and epidemiological information. The expected result is Negative.  Fact Sheet for Patients:   EntrepreneurPulse.com.au  Fact Sheet for Healthcare Providers:  IncredibleEmployment.be  This test is no t yet approved or cleared by the [place] FDA and  has been authorized for detection and/or diagnosis of SARS-CoV-2 by FDA under an Emergency Use Authorization (EUA). This EUA will remain  in effect (meaning this test can be used) for the duration of the COVID-19 declaration under Section 564(b)(1) of the Act, 21 U.S.C.section 360bbb-3(b)(1), unless the authorization  is terminated  or revoked sooner.       Influenza A by PCR NEGATIVE NEGATIVE Final   Influenza B by PCR NEGATIVE NEGATIVE Final    Comment: (NOTE) The Xpert Xpress SARS-CoV-2/FLU/RSV plus assay is intended as an aid in the diagnosis of influenza from Nasopharyngeal swab specimens and should not be used as a sole basis for treatment. Nasal washings and aspirates are unacceptable for Xpert Xpress SARS-CoV-2/FLU/RSV testing.  Fact Sheet for Patients: EntrepreneurPulse.com.au  Fact Sheet for Healthcare Providers: IncredibleEmployment.be  This test is not yet approved or cleared by the [place] FDA and has been authorized for detection and/or diagnosis of SARS-CoV-2 by FDA under an Emergency Use Authorization (EUA). This EUA will remain in effect (meaning this test can be used) for the duration of the COVID-19 declaration under Section 564(b)(1) of the Act, 21 U.S.C. section 360bbb-3(b)(1), unless the authorization is terminated or revoked.  Performed at [facility], [place] [address], [place], [place] [address]   Culture, blood (routine x 2)     Status: None   Collection Time: 02/22/21 12:28 PM   Specimen: BLOOD  Result Value Ref Range Status   Specimen Description   Final    BLOOD BLOOD LEFT HAND Performed at [facility] [address] [place], [place] [address]    Special Requests    Final    BOTTLES DRAWN AEROBIC AND ANAEROBIC Blood Culture adequate volume Performed at [facility] [address], [place], [place] [address]    Culture   Final    NO GROWTH 5 DAYS Performed at [facility], [place] [address] [place], [place] [address]    Report Status 02/27/2021 FINAL  Final  Culture, blood (routine x 2)     Status: None   Collection Time: 02/22/21 12:28 PM   Specimen: BLOOD  Result Value Ref Range Status   Specimen Description   Final    BLOOD LEFT ANTECUBITAL Performed at [facility] [address], [place], [place] [address]    Special Requests   Final    BOTTLES DRAWN AEROBIC AND ANAEROBIC Blood Culture adequate volume Performed at [facility] [address], [place], [place] [address]    Culture   Final    NO GROWTH 5 DAYS Performed at [facility], [place] [address], [place], [place] [address]    Report Status 02/27/2021 FINAL  Final  Aerobic/Anaerobic Culture w Gram Stain (surgical/deep wound)     Status: None (Preliminary result)   Collection Time: 02/22/21  6:00 PM   Specimen: Body Fluid  Result Value Ref Range Status   Specimen Description   Final    FLUID INTERVERTEBRAL DISC Performed at [facility] [address], [place], [place] [address]    Special Requests   Final    NONE Performed at [facility], [place] [address] [place], [place] [address]    Gram Stain   Final    FEW SQUAMOUS EPITHELIAL CELLS PRESENT MODERATE WBC PRESENT, PREDOMINANTLY PMN NO ORGANISMS SEEN    Culture   Final    NO GROWTH 4 DAYS NO ANAEROBES ISOLATED; CULTURE IN PROGRESS FOR 5 DAYS Performed at [facility], [place] [address], [place],  [address]    Report Status PENDING  Incomplete  Resp Panel by RT-PCR (Flu A&B, Covid) Nasopharyngeal Swab     Status: None   Collection Time: 02/26/21  6:50 PM  Specimen: Nasopharyngeal Swab; Nasopharyngeal(NP)  swabs in vial transport medium  Result Value Ref Range Status   SARS Coronavirus 2 by RT PCR NEGATIVE NEGATIVE Final    Comment: (NOTE) SARS-CoV-2 target nucleic acids are NOT DETECTED.  The SARS-CoV-2 RNA is generally detectable in upper respiratory specimens during the acute phase of infection. The lowest concentration of SARS-CoV-2 viral copies this assay can detect is 138 copies/mL. A negative result does not preclude SARS-Cov-2 infection and should not be used as the sole basis for treatment or other patient management decisions. A negative result may occur with  improper specimen collection/handling, submission of specimen other than nasopharyngeal swab, presence of viral mutation(s) within the areas targeted by this assay, and inadequate number of viral copies(<138 copies/mL). A negative result must be combined with clinical observations, patient history, and epidemiological information. The expected result is Negative.  Fact Sheet for Patients:  EntrepreneurPulse.com.au  Fact Sheet for Healthcare Providers:  IncredibleEmployment.be  This test is no t yet approved or cleared by the [place] FDA and  has been authorized for detection and/or diagnosis of SARS-CoV-2 by FDA under an Emergency Use Authorization (EUA). This EUA will remain  in effect (meaning this test can be used) for the duration of the COVID-19 declaration under Section 564(b)(1) of the Act, 21 U.S.C.section 360bbb-3(b)(1), unless the authorization is terminated  or revoked sooner.       Influenza A by PCR NEGATIVE NEGATIVE Final   Influenza B by PCR NEGATIVE NEGATIVE Final    Comment: (NOTE) The Xpert Xpress SARS-CoV-2/FLU/RSV plus assay is intended as an aid in the diagnosis of influenza from Nasopharyngeal swab specimens and should not be used as a sole basis for treatment. Nasal washings and aspirates are unacceptable for Xpert Xpress  SARS-CoV-2/FLU/RSV testing.  Fact Sheet for Patients: EntrepreneurPulse.com.au  Fact Sheet for Healthcare Providers: IncredibleEmployment.be  This test is not yet approved or cleared by the [place] FDA and has been authorized for detection and/or diagnosis of SARS-CoV-2 by FDA under an Emergency Use Authorization (EUA). This EUA will remain in effect (meaning this test can be used) for the duration of the COVID-19 declaration under Section 564(b)(1) of the Act, 21 U.S.C. section 360bbb-3(b)(1), unless the authorization is terminated or revoked.  Performed at [facility], [place] [address], [place], [place] [address]      Labs: BNP (last 3 results) No results for input(s): BNP in the last 8760 hours. Basic Metabolic Panel: Recent Labs  Lab 02/22/21 0430 02/25/21 0323 02/27/21 0456  NA 131*  --  136  K 3.5  --  2.8*  CL 98  --  104  CO2 26  --  25  GLUCOSE 110*  --  99  BUN 11  --  10  CREATININE 0.54 0.69 0.70  CALCIUM 8.1*  --  8.4*  MG  --   --  1.8   Liver Function Tests: No results for input(s): AST, ALT, ALKPHOS, BILITOT, PROT, ALBUMIN in the last 168 hours. No results for input(s): LIPASE, AMYLASE in the last 168 hours. No results for input(s): AMMONIA in the last 168 hours. CBC: Recent Labs  Lab 02/22/21 0430  WBC 9.9  NEUTROABS 7.9*  HGB 11.9*  HCT 38.0  MCV 87.4  PLT 310   Cardiac Enzymes: No results for input(s): CKTOTAL, CKMB, CKMBINDEX, TROPONINI in the last 168 hours. BNP: Invalid input(s): POCBNP CBG: No results for input(s): GLUCAP in the last 168 hours. D-Dimer No results for input(s): DDIMER  in the last 72 hours. Hgb A1c No results for input(s): HGBA1C in the last 72 hours. Lipid Profile No results for input(s): CHOL, HDL, LDLCALC, TRIG, CHOLHDL, LDLDIRECT in the last 72 hours. Thyroid function studies No results for input(s): TSH, T4TOTAL, T3FREE, THYROIDAB in the last 72  hours.  Invalid input(s): FREET3 Anemia work up No results for input(s): VITAMINB12, FOLATE, FERRITIN, TIBC, IRON, RETICCTPCT in the last 72 hours. Urinalysis    Component Value Date/Time   COLORURINE STRAW (A) 02/18/2021 1637   APPEARANCEUR CLEAR 02/18/2021 1637   LABSPEC 1.009 02/18/2021 1637   PHURINE 8.0 02/18/2021 1637   GLUCOSEU NEGATIVE 02/18/2021 1637   HGBUR MODERATE (A) 02/18/2021 1637   BILIRUBINUR NEGATIVE 02/18/2021 1637   KETONESUR 20 (A) 02/18/2021 1637   PROTEINUR NEGATIVE 02/18/2021 1637   NITRITE NEGATIVE 02/18/2021 1637   LEUKOCYTESUR NEGATIVE 02/18/2021 1637   Sepsis Labs Invalid input(s): PROCALCITONIN,  WBC,  LACTICIDVEN Microbiology Recent Results (from the past 240 hour(s))  Resp Panel by RT-PCR (Flu A&B, Covid) Nasopharyngeal Swab     Status: None   Collection Time: 02/18/21  6:15 PM   Specimen: Nasopharyngeal Swab; Nasopharyngeal(NP) swabs in vial transport medium  Result Value Ref Range Status   SARS Coronavirus 2 by RT PCR NEGATIVE NEGATIVE Final    Comment: (NOTE) SARS-CoV-2 target nucleic acids are NOT DETECTED.  The SARS-CoV-2 RNA is generally detectable in upper respiratory specimens during the acute phase of infection. The lowest concentration of SARS-CoV-2 viral copies this assay can detect is 138 copies/mL. A negative result does not preclude SARS-Cov-2 infection and should not be used as the sole basis for treatment or other patient management decisions. A negative result may occur with  improper specimen collection/handling, submission of specimen other than nasopharyngeal swab, presence of viral mutation(s) within the areas targeted by this assay, and inadequate number of viral copies(<138 copies/mL). A negative result must be combined with clinical observations, patient history, and epidemiological information. The expected result is Negative.  Fact Sheet for Patients:  EntrepreneurPulse.com.au  Fact Sheet for  Healthcare Providers:  IncredibleEmployment.be  This test is no t yet approved or cleared by the [place] FDA and  has been authorized for detection and/or diagnosis of SARS-CoV-2 by FDA under an Emergency Use Authorization (EUA). This EUA will remain  in effect (meaning this test can be used) for the duration of the COVID-19 declaration under Section 564(b)(1) of the Act, 21 U.S.C.section 360bbb-3(b)(1), unless the authorization is terminated  or revoked sooner.       Influenza A by PCR NEGATIVE NEGATIVE Final   Influenza B by PCR NEGATIVE NEGATIVE Final    Comment: (NOTE) The Xpert Xpress SARS-CoV-2/FLU/RSV plus assay is intended as an aid in the diagnosis of influenza from Nasopharyngeal swab specimens and should not be used as a sole basis for treatment. Nasal washings and aspirates are unacceptable for Xpert Xpress SARS-CoV-2/FLU/RSV testing.  Fact Sheet for Patients: EntrepreneurPulse.com.au  Fact Sheet for Healthcare Providers: IncredibleEmployment.be  This test is not yet approved or cleared by the [place] FDA and has been authorized for detection and/or diagnosis of SARS-CoV-2 by FDA under an Emergency Use Authorization (EUA). This EUA will remain in effect (meaning this test can be used) for the duration of the COVID-19 declaration under Section 564(b)(1) of the Act, 21 U.S.C. section 360bbb-3(b)(1), unless the authorization is terminated or revoked.  Performed at [facility], [place] [address], [place], [place] [address]   Culture, blood (routine x 2)  Status: None   Collection Time: 02/22/21 12:28 PM   Specimen: BLOOD  Result Value Ref Range Status   Specimen Description   Final    BLOOD BLOOD LEFT HAND Performed at [facility] [address] [place], [place] [address]    Special Requests   Final    BOTTLES DRAWN AEROBIC AND ANAEROBIC Blood Culture  adequate volume Performed at [facility] [address] [place], [place] [address]    Culture   Final    NO GROWTH 5 DAYS Performed at [facility], [place] [address], [place], [place] [address]    Report Status 02/27/2021 FINAL  Final  Culture, blood (routine x 2)     Status: None   Collection Time: 02/22/21 12:28 PM   Specimen: BLOOD  Result Value Ref Range Status   Specimen Description   Final    BLOOD LEFT ANTECUBITAL Performed at [facility] [address] [place], [place] [address]    Special Requests   Final    BOTTLES DRAWN AEROBIC AND ANAEROBIC Blood Culture adequate volume Performed at [facility] [address] [place], [place] [address]    Culture   Final    NO GROWTH 5 DAYS Performed at [facility], [place] [address] [place], [place] [address]    Report Status 02/27/2021 FINAL  Final  Aerobic/Anaerobic Culture w Gram Stain (surgical/deep wound)     Status: None (Preliminary result)   Collection Time: 02/22/21  6:00 PM   Specimen: Body Fluid  Result Value Ref Range Status   Specimen Description   Final    FLUID INTERVERTEBRAL DISC Performed at [facility] [address], [place], [place] [address]    Special Requests   Final    NONE Performed at [facility], [place] [address], [place], [place] [address]    Gram Stain   Final    FEW SQUAMOUS EPITHELIAL CELLS PRESENT MODERATE WBC PRESENT, PREDOMINANTLY PMN NO ORGANISMS SEEN    Culture   Final    NO GROWTH 4 DAYS NO ANAEROBES ISOLATED; CULTURE IN PROGRESS FOR 5 DAYS Performed at [facility], [place] [address] [place], [place] [address]    Report Status PENDING  Incomplete  Resp Panel by RT-PCR (Flu A&B, Covid) Nasopharyngeal Swab     Status: None   Collection Time: 02/26/21  6:50 PM   Specimen: Nasopharyngeal Swab; Nasopharyngeal(NP) swabs in vial transport medium  Result Value Ref Range  Status   SARS Coronavirus 2 by RT PCR NEGATIVE NEGATIVE Final    Comment: (NOTE) SARS-CoV-2 target nucleic acids are NOT DETECTED.  The SARS-CoV-2 RNA is generally detectable in upper respiratory specimens during the acute phase of infection. The lowest concentration of SARS-CoV-2 viral copies this assay can detect is 138 copies/mL. A negative result does not preclude SARS-Cov-2 infection and should not be used as the sole basis for treatment or other patient management decisions. A negative result may occur with  improper specimen collection/handling, submission of specimen other than nasopharyngeal swab, presence of viral mutation(s) within the areas targeted by this assay, and inadequate number of viral copies(<138 copies/mL). A negative result must be combined with clinical observations, patient history, and epidemiological information. The expected result is Negative.  Fact Sheet for Patients:  EntrepreneurPulse.com.au  Fact Sheet for Healthcare Providers:  IncredibleEmployment.be  This test is no t yet approved or cleared by the [place] and  has been authorized for detection and/or diagnosis of SARS-CoV-2 by FDA under an Emergency Use Authorization (EUA). This EUA will remain  in effect (meaning this test can be used) for the duration of the COVID-19 declaration under Section 564(b)(1) of the Act, 21 U.S.C.section 360bbb-3(b)(1), unless the authorization is terminated  or revoked sooner.       Influenza A by PCR NEGATIVE NEGATIVE Final   Influenza B by PCR NEGATIVE NEGATIVE Final    Comment: (NOTE) The Xpert Xpress SARS-CoV-2/FLU/RSV plus assay is intended as an aid in the diagnosis of influenza from Nasopharyngeal swab specimens and should not be used as a sole basis for treatment. Nasal washings and aspirates are unacceptable for Xpert Xpress SARS-CoV-2/FLU/RSV testing.  Fact Sheet for  Patients: EntrepreneurPulse.com.au  Fact Sheet for Healthcare Providers: IncredibleEmployment.be  This test is not yet approved or cleared by the [place] FDA and has been authorized for detection and/or diagnosis of SARS-CoV-2 by FDA under an Emergency Use Authorization (EUA). This EUA will remain in effect (meaning this test can be used) for the duration of the COVID-19 declaration under Section 564(b)(1) of the Act, 21 U.S.C. section 360bbb-3(b)(1), unless the authorization is terminated or revoked.  Performed at [facility], [place] [address], [place], [place] [address]     Please note: You were cared for by a hospitalist during your hospital stay. Once you are discharged, your primary care physician will handle any further medical issues. Please note that NO REFILLS for any discharge medications will be authorized once you are discharged, as it is imperative that you return to your primary care physician (or establish a relationship with a primary care physician if you do not have one) for your post hospital discharge needs so that they can reassess your need for medications and monitor your lab values.    Time coordinating discharge: 40 minutes  SIGNED:   Shelly Coss, MD  [facility] 02/28/2021, 9:58 AM Pager [phone]  If 7PM-7AM, please contact night-coverage [email or web address] Password [email or web address]

## 2021-02-27 NOTE — Progress Notes (Signed)
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 Severe discitis/osteomyelitis at L2-L3 (detailed above) with enhancing epidural phlegmon spanning from L1-L2 to L4-L5 (up to 1 cm thick ventrally) and into bilateral L2-L3 foramina. When superimposed on degenerative change, resulting severe canal stenosis at L2-L3 and moderate canal stenosis at the L2 and L3 levels. 2. Surrounding paraspinal myositis and phlegmon with 8 mm abscess in the right psoas at the L2-L3 level. 3. Mildly improved but persistent edema and enhancement about the left greater than right L4-L5 facet joints, possibly septic arthritis given the above findings.  Culture Result: 9/21 bcx negative; 9/21 ir aspirate disc space negative  Allergies  Allergen Reactions   Oxycontin [Oxycodone] Itching   Tetracyclines & Related Itching    OPAT Orders Discharge antibiotics to be given via PICC line Discharge antibiotics: cefazolin OPAT Order Details             Outpatient Parenteral Antibiotic Therapy Consult  Until discontinued       Provider:  (Not yet assigned)  Question Answer Comment  Antibiotic Cefazolin (Ancef) IVPB   Indications for use vertebral osteomyelitis   End Date 04/05/2021                 [facility] Care Per Protocol:  Home health RN for IV administration and teaching; PICC line care and labs.    Labs weekly while on IV antibiotics: _x_ CBC with differential __ BMP _x_ CMP _x_ CRP __ ESR __ Vancomycin trough __ CK  __ Please pull PIC at completion of IV antibiotics __ Please leave PIC in place until doctor has seen patient or been notified  Fax weekly labs to [phone]  Clinic Follow Up Appt: 10/07 @ 1130 with dr Gale Journey  @  [facility] [place], [place], [place] [address] Phone: [phone]

## 2021-02-28 LAB — AEROBIC/ANAEROBIC CULTURE W GRAM STAIN (SURGICAL/DEEP WOUND): Culture: NO GROWTH

## 2021-02-28 MED ORDER — HEPARIN SOD (PORK) LOCK FLUSH 100 UNIT/ML IV SOLN
250.0000 [IU] | INTRAVENOUS | Status: AC | PRN
  Administered 2021-02-28: 250 [IU]
  Filled 2021-02-28: qty 2.5

## 2021-02-28 NOTE — Care Management Important Message (Signed)
Important Message  Patient Details IM Letter given to the Patient. Name: Haley Singh MRN: [id number] Date of Birth: Nov 01, 1950   Medicare Important Message Given:  Yes     Caren Macadam 02/28/2021, 11:08 AM

## 2021-02-28 NOTE — Progress Notes (Signed)
Patient seen and examined at the bedside this morning.  Remains comfortable, hemodynamically stable.  She is medically stable for discharge .  Discharge summary and orders were already in place yesterday, no new change to medical management.

## 2021-02-28 NOTE — TOC Transition Note (Signed)
Transition of Care [facility]) - CM/SW Discharge Note   Patient Details  Name: Haley Singh MRN: [id number] Date of Birth: [birth year]  [facility] of [facility] CM/SW Contact:  Amada Jupiter, LCSW Phone Number: 02/28/2021, 11:20 AM   Clinical Narrative:    Have received PASRR clearance and insurance authorization for pt to dc to [facility] today.  PTAR called at 10:45am.  RN to call report to [phone].  No further TOC needs.   Final next level of care: Skilled Nursing Facility Barriers to Discharge: Barriers Resolved   Patient Goals and CMS Choice Patient states their goals for this hospitalization and ongoing recovery are:: to go home but now feels she needs SNF again for rehab      Discharge Placement   Existing PASRR number confirmed : 02/24/21          Patient chooses bed at: [facility] Patient to be transferred to facility by: PTAR Name of family member notified: friend Patient and family notified of of transfer: 02/28/21  Discharge Plan and Services                DME Arranged: N/A DME Agency: NA                  Social Determinants of Health (SDOH) Interventions     Readmission Risk Interventions Readmission Risk Prevention Plan 01/22/2021  Medication Screening Complete  Transportation Screening Complete  Some recent data might be hidden

## 2021-03-10 ENCOUNTER — Other Ambulatory Visit: Payer: Self-pay

## 2021-03-10 ENCOUNTER — Ambulatory Visit (INDEPENDENT_AMBULATORY_CARE_PROVIDER_SITE_OTHER): Payer: Medicare PPO | Admitting: Internal Medicine

## 2021-03-10 VITALS — BP 137/76 | HR 102

## 2021-03-10 DIAGNOSIS — M869 Osteomyelitis, unspecified: Secondary | ICD-10-CM

## 2021-03-10 NOTE — Progress Notes (Addendum)
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   MRN: [id number]  Chief Complaint  Patient presents with   Follow-up    PICC line cap added to line; no complaints; pt receiving IV abx at facility + PT    HPI:  Haley Singh is a 70 y.o. female here for hospital f/u mssa bacteremia/epidural spinal abscess   She is discharged on 8/26 to [facility] rehab A week ago her picc has to be replaced. She said she missed "8" doses of cefazolin.   Labs were not sent today 02/08/21 visit  No n/v/diarrhea  --------- 10/07 id clinic f/u No labs accompanied patient from the [facility] Back pain is minimal Spasm in legs much better No f/c/n/v/diarrhea/rash She is to be on cefazolin till 11/02   Allergies  Allergen Reactions   Oxycontin [Oxycodone] Itching   Tetracyclines & Related Itching      Outpatient Medications Prior to Visit  Medication Sig Dispense Refill   ceFAZolin (ANCEF) IVPB Inject 2 g into the vein every 8 (eight) hours. Indication:  vertebral osteomyelitis / discitis First Dose: No Last Day of Therapy:  04/05/21 Labs - Once weekly:  CBC/D and BMP, Labs - Every other week:  ESR and CRP Method of administration: IV Push Method of administration may be changed at the discretion of home infusion pharmacist based upon assessment of the patient and/or caregiver's ability to self-administer the medication ordered. 123 Units 0   amLODipine (NORVASC) 10 MG tablet Take 1 tablet (10 mg total)  by mouth daily.     atorvastatin (LIPITOR) 40 MG tablet Take 40 mg by mouth daily.      baclofen (LIORESAL) 10 MG tablet Take 1 tablet (10 mg total) by mouth every 8 (eight) hours as needed for muscle spasms. 30 each 0   BREO ELLIPTA 100-25 MCG/INH AEPB INHALE 1 PUFFS INTO THE LUNGS ONCE DAILY (Patient taking differently: Inhale 1 puff into the lungs every morning.) 60 each 5   cetirizine (ZYRTEC) 10 MG tablet Take 10 mg by mouth every morning.     fluticasone (FLONASE) 50 MCG/ACT nasal spray Place 1 spray into both nostrils 2 (two) times daily.      gabapentin (NEURONTIN) 300 MG capsule Take 600 mg by mouth at bedtime.     hydrALAZINE (APRESOLINE) 25 MG tablet Take 1 tablet (25 mg total) by mouth every 8 (eight) hours.     losartan (COZAAR) 100 MG tablet Take 1 tablet (100 mg total) by mouth daily.     methocarbamol (ROBAXIN) 500 MG tablet Take 1 tablet (500 mg total) by mouth every 6 (six) hours as needed for muscle spasms. 20 tablet 0   montelukast (SINGULAIR) 10 MG tablet Take 10 mg by mouth daily.      oxyCODONE-acetaminophen (PERCOCET/ROXICET) 5-325 MG tablet Take 1 tablet by mouth every 6 (six) hours as needed for severe pain. 15 tablet 0   polyethylene glycol (MIRALAX / GLYCOLAX) 17  g packet Take 17 g by mouth daily as needed. (Patient not taking: No sig reported) 14 each 0   potassium chloride SA (KLOR-CON) 20 MEQ tablet Take 2 tablets (40 mEq total) by mouth daily for 7 days. 14 tablet 0   promethazine (PHENERGAN) 12.5 MG tablet Take 12.5 mg by mouth every 6 (six) hours as needed for nausea or vomiting.     senna-docusate (SENOKOT-S) 8.6-50 MG tablet Take 1 tablet by mouth 2 (two) times daily. (Patient not taking: No sig reported) 20 tablet 0   SUMAtriptan (IMITREX) 50 MG tablet Take 50 mg by mouth See admin instructions. Take one tablet (50 mg) by mouth at onset of migraine headache, may repeat 1 dose in an hour.     traZODone (DESYREL) 150 MG tablet Take 150 mg by mouth at bedtime.       venlafaxine XR (EFFEXOR-XR) 150 MG 24 hr capsule Take 150 mg by mouth every morning.      No facility-administered medications prior to visit.     Social History   Socioeconomic History   Marital status: Single    Spouse name: Not on file   Number of children: Not on file   Years of education: Not on file   Highest education level: Not on file  Occupational History   Occupation: Retired    Fish farm manager: RETIRED  Tobacco Use   Smoking status: Never   Smokeless tobacco: Never  Vaping Use   Vaping Use: Never used  Substance and Sexual Activity   Alcohol use: Yes    Alcohol/week: 7.0 standard drinks    Types: 7 Standard drinks or equivalent per week    Comment: scotch or wine maybe 1 a night   Drug use: Never   Sexual activity: Not on file  Other Topics Concern   Not on file  Social History Narrative   Single, lives alone   No children   OCCUPATION: retired, IT support   Social Determinants of Radio broadcast assistant Strain: Not on file  Food Insecurity: Not on file  Transportation Needs: Not on file  Physical Activity: Not on file  Stress: Not on file  Social Connections: Not on file  Intimate Partner Violence: Not on file      Review of Systems    Other ros negative   Objective:    BP 137/76   Pulse (!) 102  Nursing note and vital signs reviewed.  Physical Exam  General/constitutional: no distress, pleasant; in wheel chair; wears back brace HEENT: Normocephalic, PER, Conj Clear, EOMI, Oropharynx clear Neck supple CV: rrr no mrg Lungs: clear to auscultation, normal respiratory effort Abd: Soft, Nontender Ext: no edema Skin: No Rash Neuro: nonfocal MSK: no peripheral joint swelling/tenderness/warmth; back spines nontender  Central line presence: rue picc site no erythema/tenderness   Labs: Lab Results  Component Value Date   WBC 9.9 02/22/2021   HGB 11.9 (L) 02/22/2021   HCT 38.0 02/22/2021   MCV 87.4 02/22/2021   PLT 310 93/73/4287    Last metabolic panel Lab Results  Component Value Date   GLUCOSE 99 02/27/2021   NA 136 02/27/2021   K 2.8 (L) 02/27/2021   CL 104 02/27/2021   CO2 25 02/27/2021   BUN 10 02/27/2021   CREATININE 0.70 02/27/2021   GFRNONAA >60 02/27/2021   GFRAA 60 (L) 08/12/2019   CALCIUM 8.4 (L) 02/27/2021   PROT 8.8 (H) 02/19/2021   ALBUMIN 3.4 (L) 02/19/2021   BILITOT 0.8 02/19/2021  ALKPHOS 108 02/19/2021   AST 14 (L) 02/19/2021   ALT 5 02/19/2021   ANIONGAP 7 02/27/2021   Crp: 8/26       9 (<1) 9/21       2.3 (<1) Micro:  Serology:  Imaging: 8/16 mri lumbar spine 1. Increased T2 signal about and disruption of the left L4-L5 facets, with increased T2 signal in posterior L5 vertebral body, left pedicle, and surrounding soft tissues. Edema and osseous destruction cause mild spinal canal narrowing. This could represent acute fracture or infection. Correlate with history of trauma and consider CT for further osseous evaluation. 2. L2-L3 mild canal, moderate left neural foraminal narrowing and mild-to-moderate right neural foraminal narrowing.   8/18 tte  1. Left ventricular ejection fraction, by estimation, is 60 to 65%. The  left ventricle has normal function. The left ventricle has no regional  wall motion abnormalities. There is moderate asymmetric left ventricular  hypertrophy of the basal-septal  segment. Left ventricular diastolic parameters were normal.   2. Right ventricular systolic function is normal. The right ventricular  size is normal. There is normal pulmonary artery systolic pressure. The  estimated right ventricular systolic pressure is 76.1 mmHg.   3. The mitral valve is normal in structure. Trivial mitral valve  regurgitation. No evidence of mitral stenosis.   4. The aortic valve was not well visualized. Aortic valve regurgitation  is not visualized. No aortic stenosis is present.   5. The inferior vena cava is normal in size with <50% respiratory   variability, suggesting right atrial pressure of 8 mmHg.   Conclusion(s)/Recommendation(s): No vegetation seen. If high clinical  suspicion for endocarditis, recommend TEE.    8/22 abdpelv ct with contrast 1. Progressive osteomyelitis at the left L4-5 facet joint, with evidence of ascending infection and probable epidural abscess and paraspinal phlegmon from L1 through L4. Follow-up MRI lumbar spine with and without contrast is recommended for more detailed evaluation. 2. Bilateral nonobstructing renal calculi. No evidence of obstructive uropathy within either kidney. 3. Indeterminate hypodensities right kidney, possibly hyperdense cysts, slightly increased since prior study. Follow-up renal MRI or CT could be performed if not previously evaluated. 4. Minimal gallbladder sludge.  No cholelithiasis or cholecystitis. 5.  Aortic Atherosclerosis  8/24 tee no endocarditis  9/19 mri lumbar spine 1. Severe discitis/osteomyelitis at L2-L3 (detailed above) with enhancing epidural phlegmon spanning from L1-L2 to L4-L5 (up to 1 cm thick ventrally) and into bilateral L2-L3 foramina. When superimposed on degenerative change, resulting severe canal stenosis at L2-L3 and moderate canal stenosis at the L2 and L3 levels. 2. Surrounding paraspinal myositis and phlegmon with 8 mm abscess in the right psoas at the L2-L3 level. 3. Mildly improved but persistent edema and enhancement about the left greater than right L4-L5 facet joints, possibly septic arthritis given the above findings.  Assessment & Plan:   Problem List Items Addressed This Visit   None     No orders of the defined types were placed in this encounter.    Mssa bacteremia Lumbar spine osteomyelitis   Lines:  Peripheral iv's   Abx: 8/18-c cefazolin   8/16-18 vanc                                                        Assessment: 70 yo female lumbago, OA, htn/hlp, asthma, admitted  8/16 with progressive 7 day acute  on chronic lower back pain, found to have mri imaging L4-5 vertebral om/discitis and mssa bacteremia   8/17 & 8/19 bcx mssa; 8/22 bcx ngtd 8/17 IR aspirate (left L4-5 facet joint) cx ngtd   Source usually unclear on these patients. But so far no obvious metastatic site of infection. Presumably this is hematogenous spread of mssa bacteremia.    8/18 tte no obvious valve vegetation; 8/24 tee negative for endocarditis   8/22 Ct of abd/pelv showed worsening lumbar spine epidural abscess. Clinically no LE neurological deficit or bladder/bowel incontinence. Nsg reviewed cases and deemed no surgical intervention needed at this time   Overall ipmroving. Back pain minimal. LE spasm much improved. Pending disposition   ------------- 09/7 id assessment Doing well Some issues with nursing home timing of abx administration/or actual administration itself Awaiting labs from them Tenative date of stopping abx (iv) is 10/05 -- will see based on labs if needing to extend coverage with pills  There is no spinal abscess or other occult abscess   10/07 id assessment Mri 9/19 showed psoas abscess and phlegmon epidural space Abscess very small On 11/02 when finished with planned cefazolin will transition to cefadroxil for another 4 weeks and reevaluate with mri imaging prior to stopping abx all together   Plan: Continue cefazolin See me in around 3 weeks Weekly cbc cmp crp   Follow-up: No follow-ups on file. See above  ----------- Postvisit labs received from [facility] 10/4 cr 0.8; lft 12/5/0.2; alkphos slightly up at 132; cbc 7/11/385 10/4 crp  10.21 (<0.5); esr 107 (<25)    Jabier Mutton, [place] for [place] [phone]  pager   [phone] cell 03/10/2021, 11:47 AM

## 2021-03-10 NOTE — Patient Instructions (Signed)
See me in another 3 weeks Will transition you to oral antibiotics once you finish with cefazolin on 11/02   Will plan for repeat imaging of the back while you are on oral antibiotics (will discuss more next visit)

## 2021-04-03 ENCOUNTER — Other Ambulatory Visit: Payer: Self-pay

## 2021-04-03 ENCOUNTER — Encounter: Payer: Self-pay | Admitting: Internal Medicine

## 2021-04-03 ENCOUNTER — Ambulatory Visit (INDEPENDENT_AMBULATORY_CARE_PROVIDER_SITE_OTHER): Payer: Medicare PPO | Admitting: Internal Medicine

## 2021-04-03 ENCOUNTER — Telehealth: Payer: Self-pay

## 2021-04-03 VITALS — BP 145/76 | HR 92 | Temp 97.8°F

## 2021-04-03 DIAGNOSIS — K6812 Psoas muscle abscess: Secondary | ICD-10-CM | POA: Diagnosis not present

## 2021-04-03 DIAGNOSIS — M4620 Osteomyelitis of vertebra, site unspecified: Secondary | ICD-10-CM | POA: Diagnosis not present

## 2021-04-03 MED ORDER — CEFADROXIL 1 G PO TABS: 1.0000 g | ORAL_TABLET | Freq: Two times a day (BID) | ORAL | 1 refills | Status: AC

## 2021-04-03 NOTE — Patient Instructions (Signed)
Vertebral osteomyelitis/abscess  [facility] orders: Stop cefazolin 11/01 and remove picc 11/01 Start cefadroxil 1 gram PO bid x60 days   (If you are discharged from snf tomorrow), please pickup cefadroxil  Mri back to be scheduled  Referal to neurosurgery placed for your back inability to extend straight   Please see me in 4 weeks  Labs today

## 2021-04-03 NOTE — Progress Notes (Signed)
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   MRN: [id number]  Chief Complaint  Patient presents with   Follow-up    Osteomyelitis - pt reports lower back pain when trying to stand up straight.    HPI:  Haley Singh is a 70 y.o. female here for hospital f/u mssa bacteremia/epidural spinal abscess   She is discharged on 8/26 to [facility] rehab A week ago her picc has to be replaced. She said she missed "8" doses of cefazolin.   Labs were not sent today 02/08/21 visit  No n/v/diarrhea  --------- 10/07 id clinic f/u No labs accompanied patient from the [facility] Back pain is minimal Spasm in legs much better No f/c/n/v/diarrhea/rash She is to be on cefazolin till 11/02  10/31 id clinic f/u Crp was trending up slightly last visit Patient's current tentative plan to stop iv abx was 11/02, which she has been taking since 8/18  No issue with picc No n/v/diarrhea No fever chill No back pain sitting or lying down. Tingling in her legs gone Can't stand up straight "can't just extend her back." And there is some bilateral buttock increasing pain if she stands for a "long time." She didn't come with any paper work at all from [facility]   Allergies  Allergen Reactions   Oxycontin [Oxycodone] Itching   Tetracyclines & Related Itching      Outpatient Medications Prior to Visit  Medication Sig Dispense Refill   amLODipine (NORVASC) 10 MG tablet Take 1 tablet (10 mg total) by mouth  daily.     atorvastatin (LIPITOR) 40 MG tablet Take 40 mg by mouth daily.      baclofen (LIORESAL) 10 MG tablet Take 1 tablet (10 mg total) by mouth every 8 (eight) hours as needed for muscle spasms. 30 each 0   BREO ELLIPTA 100-25 MCG/INH AEPB INHALE 1 PUFFS INTO THE LUNGS ONCE DAILY (Patient taking differently: Inhale 1 puff into the lungs every morning.) 60 each 5   ceFAZolin (ANCEF) IVPB Inject 2 g into the vein every 8 (eight) hours. Indication:  vertebral osteomyelitis / discitis First Dose: No Last Day of Therapy:  04/05/21 Labs - Once weekly:  CBC/D and BMP, Labs - Every other week:  ESR and CRP Method of administration: IV Push Method of administration may be changed at the discretion of home infusion pharmacist based upon assessment of the patient and/or caregiver's ability to self-administer the medication ordered. 123 Units 0   cetirizine (ZYRTEC) 10 MG tablet Take 10 mg by mouth every morning.     fluticasone (FLONASE) 50 MCG/ACT nasal spray Place 1 spray into both nostrils 2 (two) times daily.      gabapentin (NEURONTIN) 300 MG capsule Take 600 mg by mouth at bedtime.     hydrALAZINE (APRESOLINE) 25 MG tablet Take 1 tablet (25 mg total) by mouth every 8 (eight) hours.     losartan (COZAAR) 100 MG tablet Take 1  tablet (100 mg total) by mouth daily.     methocarbamol (ROBAXIN) 500 MG tablet Take 1 tablet (500 mg total) by mouth every 6 (six) hours as needed for muscle spasms. 20 tablet 0   montelukast (SINGULAIR) 10 MG tablet Take 10 mg by mouth daily.      oxyCODONE-acetaminophen (PERCOCET/ROXICET) 5-325 MG tablet Take 1 tablet by mouth every 6 (six) hours as needed for severe pain. 15 tablet 0   polyethylene glycol (MIRALAX / GLYCOLAX) 17 g packet Take 17 g by mouth daily as needed. 14 each 0   promethazine (PHENERGAN) 12.5 MG tablet Take 12.5 mg by mouth every 6 (six) hours as needed for nausea or vomiting.     senna-docusate (SENOKOT-S) 8.6-50 MG tablet Take 1 tablet by mouth 2  (two) times daily. 20 tablet 0   SUMAtriptan (IMITREX) 50 MG tablet Take 50 mg by mouth See admin instructions. Take one tablet (50 mg) by mouth at onset of migraine headache, may repeat 1 dose in an hour.     traZODone (DESYREL) 150 MG tablet Take 150 mg by mouth at bedtime.      venlafaxine XR (EFFEXOR-XR) 150 MG 24 hr capsule Take 150 mg by mouth every morning.      potassium chloride SA (KLOR-CON) 20 MEQ tablet Take 2 tablets (40 mEq total) by mouth daily for 7 days. 14 tablet 0   No facility-administered medications prior to visit.     Social History   Socioeconomic History   Marital status: Single    Spouse name: Not on file   Number of children: Not on file   Years of education: Not on file   Highest education level: Not on file  Occupational History   Occupation: Retired    Fish farm manager: RETIRED  Tobacco Use   Smoking status: Never   Smokeless tobacco: Never  Vaping Use   Vaping Use: Never used  Substance and Sexual Activity   Alcohol use: Yes    Alcohol/week: 7.0 standard drinks    Types: 7 Standard drinks or equivalent per week    Comment: scotch or wine maybe 1 a night   Drug use: Never   Sexual activity: Not on file  Other Topics Concern   Not on file  Social History Narrative   Single, lives alone   No children   OCCUPATION: retired, IT support   Social Determinants of Radio broadcast assistant Strain: Not on file  Food Insecurity: Not on file  Transportation Needs: Not on file  Physical Activity: Not on file  Stress: Not on file  Social Connections: Not on file  Intimate Partner Violence: Not on file      Review of Systems    Other ros negative   Objective:    BP (!) 145/76   Pulse 92   Temp 97.8 F (36.6 C) (Temporal)   SpO2 98%  Nursing note and vital signs reviewed.  Physical Exam  General/constitutional: no distress, pleasant HEENT: Normocephalic, PER, Conj Clear, EOMI, Oropharynx clear Neck supple CV: rrr no mrg Lungs: clear to  auscultation, normal respiratory effort Abd: Soft, Nontender Ext: no edema Skin: No Rash Neuro: nonfocal  MSK: no peripheral joint swelling/tenderness/warmth; back spines nontender  Central line presence: rue picc site no erythema/tenderness   Labs: Lab Results  Component Value Date   WBC 9.9 02/22/2021   HGB 11.9 (L) 02/22/2021   HCT 38.0 02/22/2021   MCV 87.4 02/22/2021   PLT 310 23/55/7322   Last metabolic  panel Lab Results  Component Value Date   GLUCOSE 99 02/27/2021   NA 136 02/27/2021   K 2.8 (L) 02/27/2021   CL 104 02/27/2021   CO2 25 02/27/2021   BUN 10 02/27/2021   CREATININE 0.70 02/27/2021   GFRNONAA >60 02/27/2021   GFRAA 60 (L) 08/12/2019   CALCIUM 8.4 (L) 02/27/2021   PROT 8.8 (H) 02/19/2021   ALBUMIN 3.4 (L) 02/19/2021   BILITOT 0.8 02/19/2021   ALKPHOS 108 02/19/2021   AST 14 (L) 02/19/2021   ALT 5 02/19/2021   ANIONGAP 7 02/27/2021   10/4 cr 0.8; lft 12/5/0.2; alkphos slightly up at 132; cbc 7/11/385 10/4 crp  10.21 (<0.5); esr 107 (<25)  Crp: 8/26       9 (<1) 9/21       2.3 (<1) 10/04    10.21 (<0.5); esr 107  Micro:  Serology:  Imaging: 8/16 mri lumbar spine 1. Increased T2 signal about and disruption of the left L4-L5 facets, with increased T2 signal in posterior L5 vertebral body, left pedicle, and surrounding soft tissues. Edema and osseous destruction cause mild spinal canal narrowing. This could represent acute fracture or infection. Correlate with history of trauma and consider CT for further osseous evaluation. 2. L2-L3 mild canal, moderate left neural foraminal narrowing and mild-to-moderate right neural foraminal narrowing.   8/18 tte  1. Left ventricular ejection fraction, by estimation, is 60 to 65%. The  left ventricle has normal function. The left ventricle has no regional  wall motion abnormalities. There is moderate asymmetric left ventricular  hypertrophy of the basal-septal  segment. Left ventricular diastolic  parameters were normal.   2. Right ventricular systolic function is normal. The right ventricular  size is normal. There is normal pulmonary artery systolic pressure. The  estimated right ventricular systolic pressure is 82.9 mmHg.   3. The mitral valve is normal in structure. Trivial mitral valve  regurgitation. No evidence of mitral stenosis.   4. The aortic valve was not well visualized. Aortic valve regurgitation  is not visualized. No aortic stenosis is present.   5. The inferior vena cava is normal in size with <50% respiratory  variability, suggesting right atrial pressure of 8 mmHg.   Conclusion(s)/Recommendation(s): No vegetation seen. If high clinical  suspicion for endocarditis, recommend TEE.    8/22 abdpelv ct with contrast 1. Progressive osteomyelitis at the left L4-5 facet joint, with evidence of ascending infection and probable epidural abscess and paraspinal phlegmon from L1 through L4. Follow-up MRI lumbar spine with and without contrast is recommended for more detailed evaluation. 2. Bilateral nonobstructing renal calculi. No evidence of obstructive uropathy within either kidney. 3. Indeterminate hypodensities right kidney, possibly hyperdense cysts, slightly increased since prior study. Follow-up renal MRI or CT could be performed if not previously evaluated. 4. Minimal gallbladder sludge.  No cholelithiasis or cholecystitis. 5.  Aortic Atherosclerosis  8/24 tee no endocarditis  9/19 mri lumbar spine 1. Severe discitis/osteomyelitis at L2-L3 (detailed above) with enhancing epidural phlegmon spanning from L1-L2 to L4-L5 (up to 1 cm thick ventrally) and into bilateral L2-L3 foramina. When superimposed on degenerative change, resulting severe canal stenosis at L2-L3 and moderate canal stenosis at the L2 and L3 levels. 2. Surrounding paraspinal myositis and phlegmon with 8 mm abscess in the right psoas at the L2-L3 level. 3. Mildly improved but persistent edema and  enhancement about the left greater than right L4-L5 facet joints, possibly septic arthritis given the above findings.  Assessment & Plan:   Problem  List Items Addressed This Visit       Musculoskeletal and Integument   Vertebral osteomyelitis ([place]) - Primary   Relevant Medications   cefadroxil (DURICEF) 1 g tablet   Other Relevant Orders   MR Lumbar Spine W Wo Contrast   CBC   Comprehensive metabolic panel   C-reactive protein   Ambulatory referral to Neurosurgery   Other Visit Diagnoses     Psoas abscess ([place])       Relevant Orders   MR Lumbar Spine W Wo Contrast   CBC   Comprehensive metabolic panel   C-reactive protein   Ambulatory referral to Neurosurgery         No orders of the defined types were placed in this encounter.    Mssa bacteremia Lumbar spine osteomyelitis Psoas abscess right side      Abx: 8/18-c cefazolin   8/16-18 vanc                                                        Assessment: 70 yo female lumbago, OA, htn/hlp, asthma, admitted 8/16 with progressive 7 day acute on chronic lower back pain, found to have mri imaging L4-5 vertebral om/discitis and mssa bacteremia   8/17 & 8/19 bcx mssa; 8/22 bcx ngtd 8/17 IR aspirate (left L4-5 facet joint) cx ngtd   Source usually unclear on these patients. But so far no obvious metastatic site of infection. Presumably this is hematogenous spread of mssa bacteremia.    8/18 tte no obvious valve vegetation; 8/24 tee negative for endocarditis   8/22 Ct of abd/pelv showed worsening lumbar spine epidural abscess. Clinically no LE neurological deficit or bladder/bowel incontinence. Nsg reviewed cases and deemed no surgical intervention needed at this time   Overall ipmroving. Back pain minimal. LE spasm much improved. Pending disposition   ------------- 09/7 id assessment Doing well Some issues with nursing home timing of abx administration/or actual administration itself Awaiting labs from  them Tenative date of stopping abx (iv) is 10/05 -- will see based on labs if needing to extend coverage with pills  There is no spinal abscess or other occult abscess   10/07 id assessment Mri 9/19 showed psoas abscess and phlegmon epidural space Abscess very small On 11/02 when finished with planned cefazolin will transition to cefadroxil for another 4 weeks and reevaluate with mri imaging prior to stopping abx all together  10/31 id assessment Doing well. Still wearing back brace from hospital. Many question but more appropriate for NSG. Need mri to see  resolution of abscess and determine duration of further abx. Crp was high, will repeat, but clinically appears infection suppressed at this time    Plan: Start cefadroxil Stop cefazolin; remove picc (order on avs summary; didn't come with snf paperwork) Mri wwo contrast lumbar spine Labs today    Follow-up: Return in about 4 weeks (around 05/01/2021).       Jabier Mutton, [place] [facility] [place] [phone]  pager   [phone] cell 04/03/2021, 1:46 PM

## 2021-04-03 NOTE — Addendum Note (Signed)
Addended by: Marcell Anger on: 04/03/2021 02:16 PM   Modules accepted: Orders

## 2021-04-03 NOTE — Telephone Encounter (Signed)
Verbal orders given to Luther Parody of nursing at [facility] per Dr.Vu - Pull PICC line before patient is discharged home tomorrow. Selena verbalized her understanding.   Laureano Hetzer Lesli Albee, CMA

## 2021-04-04 ENCOUNTER — Other Ambulatory Visit: Payer: Self-pay | Admitting: Pulmonary Disease

## 2021-04-04 LAB — C-REACTIVE PROTEIN: CRP: 8.8 mg/L — ABNORMAL HIGH (ref <8.0)

## 2021-04-04 LAB — CBC
HCT: 34.9 % — ABNORMAL LOW (ref 35.0–45.0)
Hemoglobin: 11.3 g/dL — ABNORMAL LOW (ref 11.7–15.5)
MCH: 28.3 pg (ref 27.0–33.0)
MCHC: 32.4 g/dL (ref 32.0–36.0)
MCV: 87.5 fL (ref 80.0–100.0)
MPV: 9 fL (ref 7.5–12.5)
Platelets: 274 10*3/uL (ref 140–400)
RBC: 3.99 10*6/uL (ref 3.80–5.10)
RDW: 15 % (ref 11.0–15.0)
WBC: 6.1 10*3/uL (ref 3.8–10.8)

## 2021-04-04 LAB — COMPREHENSIVE METABOLIC PANEL
AG Ratio: 1.2 (calc) (ref 1.0–2.5)
ALT: <3 U/L — ABNORMAL LOW (ref 6–29)
AST: 12 U/L (ref 10–35)
Albumin: 3.6 g/dL (ref 3.6–5.1)
Alkaline phosphatase (APISO): 102 U/L (ref 37–153)
BUN/Creatinine Ratio: 9 (calc) (ref 6–22)
BUN: 6 mg/dL — ABNORMAL LOW (ref 7–25)
CO2: 26 mmol/L (ref 20–32)
Calcium: 8.7 mg/dL (ref 8.6–10.4)
Chloride: 104 mmol/L (ref 98–110)
Creat: 0.66 mg/dL (ref 0.60–1.00)
Globulin: 3.1 g/dL (calc) (ref 1.9–3.7)
Glucose, Bld: 103 mg/dL — ABNORMAL HIGH (ref 65–99)
Potassium: 3.7 mmol/L (ref 3.5–5.3)
Sodium: 140 mmol/L (ref 135–146)
Total Bilirubin: 0.4 mg/dL (ref 0.2–1.2)
Total Protein: 6.7 g/dL (ref 6.1–8.1)

## 2021-04-07 ENCOUNTER — Telehealth: Payer: Self-pay

## 2021-04-07 NOTE — Telephone Encounter (Signed)
No i didnt order any such thing. Back brace issue should be addressed by her neurosurgeon  Thanks

## 2021-04-07 NOTE — Telephone Encounter (Signed)
Rosanne Ashing from [facility] called because patient said she was ordered by Dr Renold Don to wear a back brace. Asking for order stating how long and when and what kind of brace. Also asking who handles pain management for patient to control pain.

## 2021-04-09 ENCOUNTER — Ambulatory Visit (HOSPITAL_COMMUNITY)
Admission: RE | Admit: 2021-04-09 | Discharge: 2021-04-09 | Disposition: A | Discharge status: Home / Self Care | Payer: Medicare PPO | Source: Ambulatory Visit | Attending: Internal Medicine | Admitting: Internal Medicine

## 2021-04-09 ENCOUNTER — Other Ambulatory Visit: Payer: Self-pay

## 2021-04-09 DIAGNOSIS — K6812 Psoas muscle abscess: Secondary | ICD-10-CM | POA: Insufficient documentation

## 2021-04-09 DIAGNOSIS — M4620 Osteomyelitis of vertebra, site unspecified: Secondary | ICD-10-CM | POA: Diagnosis present

## 2021-04-09 MED ORDER — GADOBUTROL 1 MMOL/ML IV SOLN
9.0000 mL | Freq: Once | INTRAVENOUS | Status: AC | PRN
  Administered 2021-04-09: 9 mL via INTRAVENOUS

## 2021-05-01 ENCOUNTER — Encounter: Payer: Self-pay | Admitting: Internal Medicine

## 2021-05-01 ENCOUNTER — Other Ambulatory Visit: Payer: Self-pay

## 2021-05-01 ENCOUNTER — Ambulatory Visit (INDEPENDENT_AMBULATORY_CARE_PROVIDER_SITE_OTHER): Payer: Medicare PPO | Admitting: Internal Medicine

## 2021-05-01 VITALS — BP 116/75 | HR 85 | Temp 97.4°F | Resp 16 | Wt 175.2 lb

## 2021-05-01 DIAGNOSIS — M4626 Osteomyelitis of vertebra, lumbar region: Secondary | ICD-10-CM

## 2021-05-01 NOTE — Patient Instructions (Signed)
You are doing very well  Will repeat blood test today. If this is normal, and repeat labs in 1 month is also normal, will then repeat mri around that time. If mri appears to be without abscess/fluid collection, will get you off antibiotics and see how you do. If you continue to do well off antibiotics, that is the only definitive way to tell if infection is completely treated  Follow up with me in around 4-5 weeks  Continue cefadroxil at this time

## 2021-05-01 NOTE — Progress Notes (Signed)
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   MRN: [id number]  Chief Complaint  Patient presents with   Follow-up    Vertebral osteomyelitis     HPI:  Haley Singh is a 70 y.o. female here for hospital f/u mssa bacteremia/epidural spinal abscess   She is discharged on 8/26 to [facility] rehab A week ago her picc has to be replaced. She said she missed "8" doses of cefazolin.   Labs were not sent today 02/08/21 visit  No n/v/diarrhea  --------- 10/07 id clinic f/u No labs accompanied patient from the [facility] Back pain is minimal Spasm in legs much better No f/c/n/v/diarrhea/rash She is to be on cefazolin till 11/02  10/31 id clinic f/u Crp was trending up slightly last visit Patient's current tentative plan to stop iv abx was 11/02, which she has been taking since 8/18  No issue with picc No n/v/diarrhea No fever chill No back pain sitting or lying down. Tingling in her legs gone Can't stand up straight "can't just extend her back." And there is some bilateral buttock increasing pain if she stands for a "long time." She didn't come with any paper work at all from [facility]  11/28 id clinic f/u Wearing brace of the lower back with activity No f/c/n/v/diarrhea/rash Has been taking cefadroxil  Allergies  Allergen Reactions   Oxycontin [Oxycodone] Itching   Tetracyclines & Related Itching      Outpatient Medications Prior to Visit  Medication Sig Dispense Refill    amLODipine (NORVASC) 10 MG tablet Take 1 tablet (10 mg total) by mouth daily.     atorvastatin (LIPITOR) 40 MG tablet Take 40 mg by mouth daily.      BREO ELLIPTA 100-25 MCG/INH AEPB INHALE 1 PUFFS INTO THE LUNGS ONCE DAILY (Patient taking differently: Inhale 1 puff into the lungs every morning.) 60 each 5   cefadroxil (DURICEF) 1 g tablet Take 1 tablet (1 g total) by mouth 2 (two) times daily. 60 tablet 1   cetirizine (ZYRTEC) 10 MG tablet Take 10 mg by mouth every morning.     fluticasone (FLONASE) 50 MCG/ACT nasal spray Place 1 spray into both nostrils 2 (two) times daily.      gabapentin (NEURONTIN) 300 MG capsule Take 600 mg by mouth at bedtime.     hydrALAZINE (APRESOLINE) 25 MG tablet Take 1 tablet (25 mg total) by mouth every 8 (eight) hours.     losartan (COZAAR) 100 MG tablet Take 1 tablet (100 mg total) by mouth daily.     montelukast (SINGULAIR) 10 MG tablet Take 10 mg by mouth daily.      traZODone (DESYREL) 150 MG tablet Take 150 mg by mouth at bedtime.      venlafaxine XR (EFFEXOR-XR) 150 MG 24 hr capsule Take 150 mg by mouth every morning.      baclofen (LIORESAL) 10 MG tablet Take 1 tablet (10 mg total) by mouth every 8 (  eight) hours as needed for muscle spasms. 30 each 0   methocarbamol (ROBAXIN) 500 MG tablet Take 1 tablet (500 mg total) by mouth every 6 (six) hours as needed for muscle spasms. 20 tablet 0   oxyCODONE-acetaminophen (PERCOCET/ROXICET) 5-325 MG tablet Take 1 tablet by mouth every 6 (six) hours as needed for severe pain. 15 tablet 0   polyethylene glycol (MIRALAX / GLYCOLAX) 17 g packet Take 17 g by mouth daily as needed. 14 each 0   potassium chloride SA (KLOR-CON) 20 MEQ tablet Take 2 tablets (40 mEq total) by mouth daily for 7 days. 14 tablet 0   promethazine (PHENERGAN) 12.5 MG tablet Take 12.5 mg by mouth every 6 (six) hours as needed for nausea or vomiting.     senna-docusate (SENOKOT-S) 8.6-50 MG tablet Take 1 tablet by mouth 2 (two) times daily. 20 tablet 0    SUMAtriptan (IMITREX) 50 MG tablet Take 50 mg by mouth See admin instructions. Take one tablet (50 mg) by mouth at onset of migraine headache, may repeat 1 dose in an hour.     No facility-administered medications prior to visit.     Social History   Socioeconomic History   Marital status: Single    Spouse name: Not on file   Number of children: Not on file   Years of education: Not on file   Highest education level: Not on file  Occupational History   Occupation: Retired    Fish farm manager: RETIRED  Tobacco Use   Smoking status: Never   Smokeless tobacco: Never  Vaping Use   Vaping Use: Never used  Substance and Sexual Activity   Alcohol use: Yes    Alcohol/week: 7.0 standard drinks    Types: 7 Standard drinks or equivalent per week    Comment: scotch or wine maybe 1 a night   Drug use: Never   Sexual activity: Not on file  Other Topics Concern   Not on file  Social History Narrative   Single, lives alone   No children   OCCUPATION: retired, IT support   Social Determinants of Radio broadcast assistant Strain: Not on file  Food Insecurity: Not on file  Transportation Needs: Not on file  Physical Activity: Not on file  Stress: Not on file  Social Connections: Not on file  Intimate Partner Violence: Not on file      Review of Systems    Other ros negative   Objective:    BP 116/75   Pulse 85   Temp (!) 97.4 F (36.3 C) (Temporal)   Resp 16   Wt 175 lb 3.2 oz (79.5 kg)   SpO2 96%   BMI 28.28 kg/m  Nursing note and vital signs reviewed.  Physical Exam  General/constitutional: no distress, pleasant HEENT: Normocephalic, PER, Conj Clear, EOMI, Oropharynx clear Neck supple CV: rrr no mrg Lungs: clear to auscultation, normal respiratory effort Abd: Soft, Nontender Ext: no edema Skin: No Rash Neuro: nonfocal MSK: wearing brack brace.      Labs: Lab Results  Component Value Date   WBC 6.1 04/03/2021   HGB 11.3 (L) 04/03/2021   HCT 34.9 (L)  04/03/2021   MCV 87.5 04/03/2021   PLT 274 27/11/2374   Last metabolic panel Lab Results  Component Value Date   GLUCOSE 103 (H) 04/03/2021   NA 140 04/03/2021   K 3.7 04/03/2021   CL 104 04/03/2021   CO2 26 04/03/2021   BUN 6 (L) 04/03/2021   CREATININE 0.66 04/03/2021  GFRNONAA >60 02/27/2021   GFRAA 60 (L) 08/12/2019   CALCIUM 8.7 04/03/2021   PROT 6.7 04/03/2021   ALBUMIN 3.4 (L) 02/19/2021   BILITOT 0.4 04/03/2021   ALKPHOS 108 02/19/2021   AST 12 04/03/2021   ALT <3 (L) 04/03/2021   ANIONGAP 7 02/27/2021   10/4 cr 0.8; lft 12/5/0.2; alkphos slightly up at 132; cbc 7/11/385 10/4 crp  10.21 (<0.5); esr 107 (<25)  Crp: 8/26       9 (<1) 9/21       2.3 (<1) 10/04    10.21 (<0.5); esr 107 10/31      8.8 (<10)  Micro:  Serology:  Imaging: 8/16 mri lumbar spine 1. Increased T2 signal about and disruption of the left L4-L5 facets, with increased T2 signal in posterior L5 vertebral body, left pedicle, and surrounding soft tissues. Edema and osseous destruction cause mild spinal canal narrowing. This could represent acute fracture or infection. Correlate with history of trauma and consider CT for further osseous evaluation. 2. L2-L3 mild canal, moderate left neural foraminal narrowing and mild-to-moderate right neural foraminal narrowing.   8/18 tte  1. Left ventricular ejection fraction, by estimation, is 60 to 65%. The  left ventricle has normal function. The left ventricle has no regional  wall motion abnormalities. There is moderate asymmetric left ventricular  hypertrophy of the basal-septal  segment. Left ventricular diastolic parameters were normal.   2. Right ventricular systolic function is normal. The right ventricular  size is normal. There is normal pulmonary artery systolic pressure. The  estimated right ventricular systolic pressure is 50.5 mmHg.   3. The mitral valve is normal in structure. Trivial mitral valve  regurgitation. No evidence of  mitral stenosis.   4. The aortic valve was not well visualized. Aortic valve regurgitation  is not visualized. No aortic stenosis is present.   5. The inferior vena cava is normal in size with <50% respiratory  variability, suggesting right atrial pressure of 8 mmHg.   Conclusion(s)/Recommendation(s): No vegetation seen. If high clinical  suspicion for endocarditis, recommend TEE.    8/22 abdpelv ct with contrast 1. Progressive osteomyelitis at the left L4-5 facet joint, with evidence of ascending infection and probable epidural abscess and paraspinal phlegmon from L1 through L4. Follow-up MRI lumbar spine with and without contrast is recommended for more detailed evaluation. 2. Bilateral nonobstructing renal calculi. No evidence of obstructive uropathy within either kidney. 3. Indeterminate hypodensities right kidney, possibly hyperdense cysts, slightly increased since prior study. Follow-up renal MRI or CT could be performed if not previously evaluated. 4. Minimal gallbladder sludge.  No cholelithiasis or cholecystitis. 5.  Aortic Atherosclerosis  8/24 tee no endocarditis  9/19 mri lumbar spine 1. Severe discitis/osteomyelitis at L2-L3 (detailed above) with enhancing epidural phlegmon spanning from L1-L2 to L4-L5 (up to 1 cm thick ventrally) and into bilateral L2-L3 foramina. When superimposed on degenerative change, resulting severe canal stenosis at L2-L3 and moderate canal stenosis at the L2 and L3 levels. 2. Surrounding paraspinal myositis and phlegmon with 8 mm abscess in the right psoas at the L2-L3 level. 3. Mildly improved but persistent edema and enhancement about the left greater than right L4-L5 facet joints, possibly septic arthritis given the above findings.  11/06 mri lumbar spine 1. L2-3 discitis/osteomyelitis with progressive L2 deformity but positive treatment response with improved bone marrow and paravertebral inflammation. There is notable disc space fluid  of indeterminate sterility, especially where undermining a ventral osteophyte at the level of L2. 2. Generalized lumbar  spine degeneration with scoliosis and listheses. Up to moderate spinal stenosis at L1-2 and L2-3.  Assessment & Plan:   Problem List Items Addressed This Visit   None Visit Diagnoses     Acute osteomyelitis of lumbar spine ([place])    -  Primary        No orders of the defined types were placed in this encounter.    Mssa bacteremia Lumbar spine osteomyelitis Psoas abscess right side      Abx: 11/02-c cefadroxil  8/18-11/02 cefazolin   8/16-18 vanc                                                        Assessment: 70 yo female lumbago, OA, htn/hlp, asthma, admitted 8/16 with progressive 7 day acute on chronic lower back pain, found to have mri imaging L4-5 vertebral om/discitis and mssa bacteremia   8/17 & 8/19 bcx mssa; 8/22 bcx ngtd 8/17 IR aspirate (left L4-5 facet joint) cx ngtd   Source usually unclear on these patients. But so far no obvious metastatic site of infection. Presumably this is hematogenous spread of mssa bacteremia.    8/18 tte no obvious valve vegetation; 8/24 tee negative for endocarditis   8/22 Ct of abd/pelv showed worsening lumbar spine epidural abscess. Clinically no LE neurological deficit or bladder/bowel incontinence. Nsg reviewed cases and deemed no surgical intervention needed at this time   Overall ipmroving. Back pain minimal. LE spasm much improved. Pending disposition   ------------- 09/7 id assessment Doing well Some issues with nursing home timing of abx administration/or actual administration itself Awaiting labs from them Tenative date of stopping abx (iv) is 10/05 -- will see based on labs if needing to extend coverage with pills  There is no spinal abscess or other occult abscess   10/07 id assessment Mri 9/19 showed psoas abscess and phlegmon epidural space Abscess very small On 11/02 when finished  with planned cefazolin will transition to cefadroxil for another 4 weeks and reevaluate with mri imaging prior to stopping abx all together  10/31 id assessment Doing well. Still wearing back brace from hospital. Many question but more appropriate for NSG. Need mri to see  resolution of abscess and determine duration of further abx. Crp was high, will repeat, but clinically appears infection suppressed at this time  11/28 id assessment Patient doing well on oral abx started last visit Due for labs today Reviewed the mri again from early 04/2021 -- some fluid collection mentioned but overall better than previous mri. No further psoas abscess or paraspinal myositis/phlegmon.   At this point continuing oral abx due to fluid collection. Plans to dc if normalized crp and mri without further fluid collection  No hardware present at surgical site. Right ankle does have orif but not involved.  Plan: Continue cefadroxil Labs today Please follow up with me in 4 weeks   Follow-up: Return in about 4 weeks (around 05/29/2021).       Jabier Mutton, [facility] [phone]  pager   [phone] cell 05/01/2021, 2:04 PM

## 2021-05-02 LAB — COMPREHENSIVE METABOLIC PANEL
AG Ratio: 1.5 (calc) (ref 1.0–2.5)
ALT: 10 U/L (ref 6–29)
AST: 14 U/L (ref 10–35)
Albumin: 3.8 g/dL (ref 3.6–5.1)
Alkaline phosphatase (APISO): 88 U/L (ref 37–153)
BUN: 11 mg/dL (ref 7–25)
CO2: 28 mmol/L (ref 20–32)
Calcium: 8.8 mg/dL (ref 8.6–10.4)
Chloride: 103 mmol/L (ref 98–110)
Creat: 0.83 mg/dL (ref 0.60–1.00)
Globulin: 2.6 g/dL (calc) (ref 1.9–3.7)
Glucose, Bld: 75 mg/dL (ref 65–99)
Potassium: 4.5 mmol/L (ref 3.5–5.3)
Sodium: 139 mmol/L (ref 135–146)
Total Bilirubin: 0.6 mg/dL (ref 0.2–1.2)
Total Protein: 6.4 g/dL (ref 6.1–8.1)

## 2021-05-02 LAB — C-REACTIVE PROTEIN: CRP: 4.1 mg/L (ref <8.0)

## 2021-06-12 ENCOUNTER — Ambulatory Visit: Payer: Medicare Other | Admitting: Internal Medicine

## 2021-06-12 ENCOUNTER — Encounter: Payer: Self-pay | Admitting: Internal Medicine

## 2021-06-12 ENCOUNTER — Other Ambulatory Visit: Payer: Self-pay

## 2021-06-12 VITALS — BP 127/80 | HR 90 | Temp 98.2°F | Wt 181.0 lb

## 2021-06-12 DIAGNOSIS — G062 Extradural and subdural abscess, unspecified: Secondary | ICD-10-CM | POA: Diagnosis not present

## 2021-06-12 DIAGNOSIS — M4620 Osteomyelitis of vertebra, site unspecified: Secondary | ICD-10-CM | POA: Diagnosis not present

## 2021-06-12 MED ORDER — CEFADROXIL 500 MG PO CAPS: 1000.0000 mg | ORAL_CAPSULE | Freq: Two times a day (BID) | ORAL | 0 refills | Status: DC

## 2021-06-12 NOTE — Progress Notes (Signed)
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   MRN: [id number]  Chief Complaint  Patient presents with   Follow-up    HPI:  Haley Singh is a 71 y.o. female here for hospital f/u mssa bacteremia/epidural spinal abscess   She is discharged on 8/26 to [facility] rehab A week ago her picc has to be replaced. She said she missed "8" doses of cefazolin.   Labs were not sent today 02/08/21 visit  No n/v/diarrhea  --------- 10/07 id clinic f/u No labs accompanied patient from the [facility] Back pain is minimal Spasm in legs much better No f/c/n/v/diarrhea/rash She is to be on cefazolin till 11/02  10/31 id clinic f/u Crp was trending up slightly last visit Patient's current tentative plan to stop iv abx was 11/02, which she has been taking since 8/18  No issue with picc No n/v/diarrhea No fever chill No back pain sitting or lying down. Tingling in her legs gone Can't stand up straight "can't just extend her back." And there is some bilateral buttock increasing pain if she stands for a "long time." She didn't come with any paper work at all from [facility]  11/28 id clinic f/u Wearing brace of the lower back with activity No f/c/n/v/diarrhea/rash Has been taking cefadroxil  06/12/21 id clinic f/u Continues to improve in pain. Walks good distance, pain mostly with prolonged walking improved quickly with rest Not yet f/u nsg (next month) No f/c No neurologic deficit Tolerating  cefadroxil  Allergies  Allergen Reactions   Oxycontin [Oxycodone] Itching   Tetracyclines & Related Itching      Outpatient Medications Prior to Visit  Medication Sig Dispense Refill   amLODipine (NORVASC) 10 MG tablet Take 1 tablet (10 mg total) by mouth daily.     atorvastatin (LIPITOR) 40 MG tablet Take 40 mg by mouth daily.      BREO ELLIPTA 100-25 MCG/INH AEPB INHALE 1 PUFFS INTO THE LUNGS ONCE DAILY (Patient taking differently: Inhale 1 puff into the lungs every morning.) 60 each 5   cetirizine (ZYRTEC) 10 MG tablet Take 10 mg by mouth every morning.     fluticasone (FLONASE) 50 MCG/ACT nasal spray Place 1 spray into both nostrils 2 (two) times daily.      gabapentin (NEURONTIN) 300 MG capsule Take 600 mg by mouth at bedtime.     hydrALAZINE (APRESOLINE) 25 MG tablet Take 1 tablet (25 mg total) by mouth every 8 (eight) hours.     losartan (COZAAR) 100 MG tablet Take 1 tablet (100 mg total) by mouth daily.     montelukast (SINGULAIR) 10 MG tablet Take 10 mg by mouth daily.      traZODone (DESYREL) 150 MG tablet Take 150 mg by mouth at bedtime.      venlafaxine XR (EFFEXOR-XR) 150 MG 24 hr capsule Take 150 mg by mouth every morning.      baclofen (LIORESAL) 10 MG tablet Take 1  tablet (10 mg total) by mouth every 8 (eight) hours as needed for muscle spasms. 30 each 0   methocarbamol (ROBAXIN) 500 MG tablet Take 1 tablet (500 mg total) by mouth every 6 (six) hours as needed for muscle spasms. 20 tablet 0   oxyCODONE-acetaminophen (PERCOCET/ROXICET) 5-325 MG tablet Take 1 tablet by mouth every 6 (six) hours as needed for severe pain. 15 tablet 0   polyethylene glycol (MIRALAX / GLYCOLAX) 17 g packet Take 17 g by mouth daily as needed. 14 each 0   potassium chloride SA (KLOR-CON) 20 MEQ tablet Take 2 tablets (40 mEq total) by mouth daily for 7 days. 14 tablet 0   promethazine (PHENERGAN) 12.5 MG tablet Take 12.5 mg by mouth every 6 (six) hours as needed for nausea or vomiting.      senna-docusate (SENOKOT-S) 8.6-50 MG tablet Take 1 tablet by mouth 2 (two) times daily. 20 tablet 0   SUMAtriptan (IMITREX) 50 MG tablet Take 50 mg by mouth See admin instructions. Take one tablet (50 mg) by mouth at onset of migraine headache, may repeat 1 dose in an hour.     No facility-administered medications prior to visit.     Social History   Socioeconomic History   Marital status: Single    Spouse name: Not on file   Number of children: Not on file   Years of education: Not on file   Highest education level: Not on file  Occupational History   Occupation: Retired    Fish farm manager: RETIRED  Tobacco Use   Smoking status: Never   Smokeless tobacco: Never  Vaping Use   Vaping Use: Never used  Substance and Sexual Activity   Alcohol use: Yes    Alcohol/week: 7.0 standard drinks    Types: 7 Standard drinks or equivalent per week    Comment: scotch or wine maybe 1 a night   Drug use: Never   Sexual activity: Not on file  Other Topics Concern   Not on file  Social History Narrative   Single, lives alone   No children   OCCUPATION: retired, IT support   Social Determinants of Radio broadcast assistant Strain: Not on file  Food Insecurity: Not on file  Transportation Needs: Not on file  Physical Activity: Not on file  Stress: Not on file  Social Connections: Not on file  Intimate Partner Violence: Not on file      Review of Systems    Other ros negative   Objective:    BP 127/80    Pulse 90    Temp 98.2 F (36.8 C) (Temporal)    Wt 181 lb (82.1 kg)    BMI 29.21 kg/m  Nursing note and vital signs reviewed.  Physical Exam General/constitutional: no distress, pleasant HEENT: Normocephalic, PER, Conj Clear, EOMI, Oropharynx clear Neck supple CV: rrr no mrg Lungs: clear to auscultation, normal respiratory effort Abd: Soft, Nontender Ext: no edema Skin: No Rash Neuro: nonfocal  MSK: wearing brack brace.      Labs: Lab Results  Component Value  Date   WBC 6.1 04/03/2021   HGB 11.3 (L) 04/03/2021   HCT 34.9 (L) 04/03/2021   MCV 87.5 04/03/2021   PLT 274 01/74/9449   Last metabolic panel Lab Results  Component Value Date   GLUCOSE 75 05/01/2021   NA 139 05/01/2021   K 4.5 05/01/2021   CL 103 05/01/2021   CO2 28 05/01/2021   BUN 11 05/01/2021   CREATININE 0.83 05/01/2021  GFRNONAA >60 02/27/2021   GFRAA 60 (L) 08/12/2019   CALCIUM 8.8 05/01/2021   PROT 6.4 05/01/2021   ALBUMIN 3.4 (L) 02/19/2021   BILITOT 0.6 05/01/2021   ALKPHOS 108 02/19/2021   AST 14 05/01/2021   ALT 10 05/01/2021   ANIONGAP 7 02/27/2021     Crp: 8/26       9 (<1) 9/21       2.3 (<1) 10/04    10.21 (<0.5); esr 107 10/31      8.8 (<10) 11/28     4.1 (<8)  Micro:  Serology:  Imaging: 8/16 mri lumbar spine 1. Increased T2 signal about and disruption of the left L4-L5 facets, with increased T2 signal in posterior L5 vertebral body, left pedicle, and surrounding soft tissues. Edema and osseous destruction cause mild spinal canal narrowing. This could represent acute fracture or infection. Correlate with history of trauma and consider CT for further osseous evaluation. 2. L2-L3 mild canal, moderate left neural foraminal narrowing and mild-to-moderate right neural foraminal narrowing.   8/18 tte  1. Left ventricular ejection fraction, by estimation, is 60 to 65%. The  left ventricle has normal function. The left ventricle has no regional  wall motion abnormalities. There is moderate asymmetric left ventricular  hypertrophy of the basal-septal  segment. Left ventricular diastolic parameters were normal.   2. Right ventricular systolic function is normal. The right ventricular  size is normal. There is normal pulmonary artery systolic pressure. The  estimated right ventricular systolic pressure is 88.4 mmHg.   3. The mitral valve is normal in structure. Trivial mitral valve  regurgitation. No evidence of mitral stenosis.   4. The  aortic valve was not well visualized. Aortic valve regurgitation  is not visualized. No aortic stenosis is present.   5. The inferior vena cava is normal in size with <50% respiratory  variability, suggesting right atrial pressure of 8 mmHg.   Conclusion(s)/Recommendation(s): No vegetation seen. If high clinical  suspicion for endocarditis, recommend TEE.    8/22 abdpelv ct with contrast 1. Progressive osteomyelitis at the left L4-5 facet joint, with evidence of ascending infection and probable epidural abscess and paraspinal phlegmon from L1 through L4. Follow-up MRI lumbar spine with and without contrast is recommended for more detailed evaluation. 2. Bilateral nonobstructing renal calculi. No evidence of obstructive uropathy within either kidney. 3. Indeterminate hypodensities right kidney, possibly hyperdense cysts, slightly increased since prior study. Follow-up renal MRI or CT could be performed if not previously evaluated. 4. Minimal gallbladder sludge.  No cholelithiasis or cholecystitis. 5.  Aortic Atherosclerosis  8/24 tee no endocarditis  9/19 mri lumbar spine 1. Severe discitis/osteomyelitis at L2-L3 (detailed above) with enhancing epidural phlegmon spanning from L1-L2 to L4-L5 (up to 1 cm thick ventrally) and into bilateral L2-L3 foramina. When superimposed on degenerative change, resulting severe canal stenosis at L2-L3 and moderate canal stenosis at the L2 and L3 levels. 2. Surrounding paraspinal myositis and phlegmon with 8 mm abscess in the right psoas at the L2-L3 level. 3. Mildly improved but persistent edema and enhancement about the left greater than right L4-L5 facet joints, possibly septic arthritis given the above findings.  11/06 mri lumbar spine 1. L2-3 discitis/osteomyelitis with progressive L2 deformity but positive treatment response with improved bone marrow and paravertebral inflammation. There is notable disc space fluid of indeterminate  sterility, especially where undermining a ventral osteophyte at the level of L2. 2. 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 Up to moderate spinal stenosis at L1-2  and L2-3.  Assessment & Plan:   Problem List Items Addressed This Visit       Nervous and Auditory   Epidural abscess   Relevant Orders   CBC   COMPLETE METABOLIC PANEL WITH GFR   C-reactive protein   MR LUMBAR SPINE WO CONTRAST     Musculoskeletal and Integument   Vertebral osteomyelitis ([place]) - Primary   Relevant Medications   cefadroxil (DURICEF) 500 MG capsule   Other Relevant Orders   CBC   COMPLETE METABOLIC PANEL WITH GFR   C-reactive protein   MR LUMBAR SPINE WO CONTRAST    No orders of the defined types were placed in this encounter.    Mssa bacteremia Lumbar spine osteomyelitis Psoas abscess right side      Abx: 11/02-c cefadroxil  8/18-11/02 cefazolin   8/16-18 vanc                                                        Assessment: 71 yo female lumbago, OA, htn/hlp, asthma, admitted 8/16 with progressive 7 day acute on chronic lower back pain, found to have mri imaging L4-5 vertebral om/discitis and mssa bacteremia   8/17 & 8/19 bcx mssa; 8/22 bcx ngtd 8/17 IR aspirate (left L4-5 facet joint) cx ngtd   Source usually unclear on these patients. But so far no obvious metastatic site of infection. Presumably this is hematogenous spread of mssa bacteremia.    8/18 tte no obvious valve vegetation; 8/24 tee negative for endocarditis   8/22 Ct of abd/pelv showed worsening lumbar spine epidural abscess. Clinically no LE neurological deficit or bladder/bowel incontinence. Nsg reviewed cases and deemed no surgical intervention needed at this time   Overall ipmroving. Back pain minimal. LE spasm much improved. Pending disposition   ------------- 09/7 id assessment Doing well Some issues with nursing home timing of abx administration/or actual administration  itself Awaiting labs from them Tenative date of stopping abx (iv) is 10/05 -- will see based on labs if needing to extend coverage with pills  There is no spinal abscess or other occult abscess   10/07 id assessment Mri 9/19 showed psoas abscess and phlegmon epidural space Abscess very small On 11/02 when finished with planned cefazolin will transition to cefadroxil for another 4 weeks and reevaluate with mri imaging prior to stopping abx all together  10/31 id assessment Doing well. Still wearing back brace from hospital. Many question but more appropriate for NSG. Need mri to see  resolution of abscess and determine duration of further abx. Crp was high, will repeat, but clinically appears infection suppressed at this time  11/28 id assessment Patient doing well on oral abx started last visit Due for labs today Reviewed the mri again from early 04/2021 -- some fluid collection mentioned but overall better than previous mri. No further psoas abscess or paraspinal myositis/phlegmon.   At this point continuing oral abx due to fluid collection. Plans to dc if normalized crp and mri without further fluid collection  No hardware present at surgical site. Right ankle does have orif but not involved.  06/12/2021 id assessment Clinically improving, less pain medication use Yet to see NSG Repeat labs today Mri l-spine repeat in 3-4 weeks See me in 5-6 weeks Continue cefadroxil   Follow-up: Return in about 6 weeks (around 07/24/2021).  Jabier Mutton, [facility] [phone]  pager   [phone] cell 06/12/2021, 2:01 PM

## 2021-06-12 NOTE — Patient Instructions (Signed)
Continue cefadroxil 1000 mg twice a day  Mri lumbar spine to be done next 3-4 weeks  See me 5-6 weeks

## 2021-06-13 LAB — COMPLETE METABOLIC PANEL WITH GFR
AG Ratio: 1.7 (calc) (ref 1.0–2.5)
ALT: 13 U/L (ref 6–29)
AST: 11 U/L (ref 10–35)
Albumin: 4.1 g/dL (ref 3.6–5.1)
Alkaline phosphatase (APISO): 97 U/L (ref 37–153)
BUN: 16 mg/dL (ref 7–25)
CO2: 24 mmol/L (ref 20–32)
Calcium: 9.6 mg/dL (ref 8.6–10.4)
Chloride: 105 mmol/L (ref 98–110)
Creat: 0.85 mg/dL (ref 0.60–1.00)
Globulin: 2.4 g/dL (calc) (ref 1.9–3.7)
Glucose, Bld: 133 mg/dL — ABNORMAL HIGH (ref 65–99)
Potassium: 4.3 mmol/L (ref 3.5–5.3)
Sodium: 139 mmol/L (ref 135–146)
Total Bilirubin: 0.6 mg/dL (ref 0.2–1.2)
Total Protein: 6.5 g/dL (ref 6.1–8.1)
eGFR: 73 mL/min/{1.73_m2} (ref >=60)

## 2021-06-13 LAB — CBC
HCT: 38 % (ref 35.0–45.0)
Hemoglobin: 12.9 g/dL (ref 11.7–15.5)
MCH: 30.3 pg (ref 27.0–33.0)
MCHC: 33.9 g/dL (ref 32.0–36.0)
MCV: 89.2 fL (ref 80.0–100.0)
MPV: 8.7 fL (ref 7.5–12.5)
Platelets: 239 10*3/uL (ref 140–400)
RBC: 4.26 10*6/uL (ref 3.80–5.10)
RDW: 12.3 % (ref 11.0–15.0)
WBC: 4.5 10*3/uL (ref 3.8–10.8)

## 2021-06-13 LAB — C-REACTIVE PROTEIN: CRP: 0.7 mg/L (ref <8.0)

## 2021-06-19 ENCOUNTER — Ambulatory Visit (HOSPITAL_COMMUNITY): Payer: Medicare Other

## 2021-07-03 ENCOUNTER — Other Ambulatory Visit: Payer: Self-pay

## 2021-07-03 ENCOUNTER — Ambulatory Visit (HOSPITAL_COMMUNITY)
Admission: RE | Admit: 2021-07-03 | Discharge: 2021-07-03 | Disposition: A | Discharge status: Home / Self Care | Payer: Medicare Other | Source: Ambulatory Visit | Attending: Internal Medicine | Admitting: Internal Medicine

## 2021-07-03 DIAGNOSIS — G062 Extradural and subdural abscess, unspecified: Secondary | ICD-10-CM | POA: Insufficient documentation

## 2021-07-03 DIAGNOSIS — M4620 Osteomyelitis of vertebra, site unspecified: Secondary | ICD-10-CM | POA: Diagnosis present

## 2021-07-09 ENCOUNTER — Other Ambulatory Visit: Payer: Self-pay | Admitting: Internal Medicine

## 2021-07-10 NOTE — Telephone Encounter (Signed)
Okay to refill? Patient has appt 3/1 - thanks!

## 2021-08-02 ENCOUNTER — Encounter: Payer: Self-pay | Admitting: Internal Medicine

## 2021-08-02 ENCOUNTER — Other Ambulatory Visit: Payer: Self-pay

## 2021-08-02 ENCOUNTER — Ambulatory Visit: Payer: Medicare Other | Admitting: Internal Medicine

## 2021-08-02 VITALS — BP 111/64 | HR 76 | Temp 98.3°F | Wt 194.0 lb

## 2021-08-02 DIAGNOSIS — M4626 Osteomyelitis of vertebra, lumbar region: Secondary | ICD-10-CM

## 2021-08-02 DIAGNOSIS — G062 Extradural and subdural abscess, unspecified: Secondary | ICD-10-CM

## 2021-08-02 NOTE — Patient Instructions (Signed)
Stop antibiotics ? ? ?Monitor for worsening back pain/fever, chill. If these happen please call our clinic.  ?Failure of treatment/relapse of infection usually happen within first few weeks of stopping antibiotics ? ? ? ?

## 2021-08-02 NOTE — Progress Notes (Signed)
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   MRN: [id number]  Chief Complaint  Patient presents with   Follow-up    HPI:  Haley Singh is a 71 y.o. female here for hospital f/u mssa bacteremia/epidural spinal abscess   She is discharged on 8/26 to [facility] rehab A week ago her picc has to be replaced. She said she missed "8" doses of cefazolin.   Labs were not sent today 02/08/21 visit  No n/v/diarrhea  --------- 10/07 id clinic f/u No labs accompanied patient from the [facility] Back pain is minimal Spasm in legs much better No f/c/n/v/diarrhea/rash She is to be on cefazolin till 11/02  10/31 id clinic f/u Crp was trending up slightly last visit Patient's current tentative plan to stop iv abx was 11/02, which she has been taking since 8/18  No issue with picc No n/v/diarrhea No fever chill No back pain sitting or lying down. Tingling in her legs gone Can't stand up straight "can't just extend her back." And there is some bilateral buttock increasing pain if she stands for a "long time." She didn't come with any paper work at all from [facility]  11/28 id clinic f/u Wearing brace of the lower back with activity No f/c/n/v/diarrhea/rash Has been taking cefadroxil  06/12/21 id clinic f/u Continues to improve in pain. Walks good distance, pain mostly with prolonged walking improved quickly with rest Not yet f/u nsg (next month) No f/c No neurologic deficit Tolerating  cefadroxil  3/1 id clinic f/u Still on antibiotics Mri reviewed again Feels like her back pain 2/10 seems "external/musculoskeletal" only if prolonged walking No f/c  Saw her nsg and ok without wearing the back brace Will see her nsg again in 3 months  Allergies  Allergen Reactions   Oxycontin [Oxycodone] Itching   Tetracyclines & Related Itching      Outpatient Medications Prior to Visit  Medication Sig Dispense Refill   amLODipine (NORVASC) 10 MG tablet Take 1 tablet (10 mg total) by mouth daily.     atorvastatin (LIPITOR) 40 MG tablet Take 40 mg by mouth daily.      BREO ELLIPTA 100-25 MCG/INH AEPB INHALE 1 PUFFS INTO THE LUNGS ONCE DAILY (Patient taking differently: Inhale 1 puff into the lungs every morning.) 60 each 5   cefadroxil (DURICEF) 500 MG capsule TAKE TWO CAPSULES BY MOUTH TWICE A DAY 120 capsule 0   cetirizine (ZYRTEC) 10 MG tablet Take 10 mg by mouth every morning.     fluticasone (FLONASE) 50 MCG/ACT nasal spray Place 1 spray into both nostrils 2 (two) times daily.      gabapentin (NEURONTIN) 300 MG capsule Take 600 mg by mouth at bedtime.     hydrALAZINE (APRESOLINE) 25 MG tablet Take 1 tablet (25 mg total) by mouth every 8 (eight) hours.     losartan (COZAAR) 100 MG tablet Take 1 tablet (100 mg total) by mouth daily.  montelukast (SINGULAIR) 10 MG tablet Take 10 mg by mouth daily.      traZODone (DESYREL) 150 MG tablet Take 150 mg by mouth at bedtime.      venlafaxine XR (EFFEXOR-XR) 150 MG 24 hr capsule Take 150 mg by mouth every morning.      baclofen (LIORESAL) 10 MG tablet Take 1 tablet (10 mg total) by mouth every 8 (eight) hours as needed for muscle spasms. 30 each 0   methocarbamol (ROBAXIN) 500 MG tablet Take 1 tablet (500 mg total) by mouth every 6 (six) hours as needed for muscle spasms. 20 tablet 0   oxyCODONE-acetaminophen (PERCOCET/ROXICET) 5-325 MG tablet Take 1 tablet by mouth every 6 (six) hours as needed for severe pain. 15 tablet 0    polyethylene glycol (MIRALAX / GLYCOLAX) 17 g packet Take 17 g by mouth daily as needed. 14 each 0   potassium chloride SA (KLOR-CON) 20 MEQ tablet Take 2 tablets (40 mEq total) by mouth daily for 7 days. 14 tablet 0   promethazine (PHENERGAN) 12.5 MG tablet Take 12.5 mg by mouth every 6 (six) hours as needed for nausea or vomiting.     senna-docusate (SENOKOT-S) 8.6-50 MG tablet Take 1 tablet by mouth 2 (two) times daily. 20 tablet 0   SUMAtriptan (IMITREX) 50 MG tablet Take 50 mg by mouth See admin instructions. Take one tablet (50 mg) by mouth at onset of migraine headache, may repeat 1 dose in an hour.     No facility-administered medications prior to visit.     Social History   Socioeconomic History   Marital status: Single    Spouse name: Not on file   Number of children: Not on file   Years of education: Not on file   Highest education level: Not on file  Occupational History   Occupation: Retired    Fish farm manager: RETIRED  Tobacco Use   Smoking status: Never   Smokeless tobacco: Never  Vaping Use   Vaping Use: Never used  Substance and Sexual Activity   Alcohol use: Yes    Alcohol/week: 7.0 standard drinks    Types: 7 Standard drinks or equivalent per week    Comment: scotch or wine maybe 1 a night   Drug use: Never   Sexual activity: Not on file  Other Topics Concern   Not on file  Social History Narrative   Single, lives alone   No children   OCCUPATION: retired, IT support   Social Determinants of Radio broadcast assistant Strain: Not on file  Food Insecurity: Not on file  Transportation Needs: Not on file  Physical Activity: Not on file  Stress: Not on file  Social Connections: Not on file  Intimate Partner Violence: Not on file      Review of Systems    Other ros negative   Objective:    BP 111/64    Pulse 76    Temp 98.3 F (36.8 C) (Temporal)    Wt 194 lb (88 kg)    BMI 31.31 kg/m  Nursing note and vital signs reviewed.  Physical  Exam General/constitutional: no distress, pleasant HEENT: Normocephalic, PER, Conj Clear, EOMI, Oropharynx clear Neck supple CV: rrr no mrg Lungs: clear to auscultation, normal respiratory effort Abd: Soft, Nontender Ext: no edema Skin: No Rash Neuro: nonfocal MSK: no peripheral joint swelling/tenderness/warmth; back spines nontender       Labs: Lab Results  Component Value Date   WBC 4.5 06/12/2021   HGB 12.9 06/12/2021  HCT 38.0 06/12/2021   MCV 89.2 06/12/2021   PLT 239 33/54/5625   Last metabolic panel Lab Results  Component Value Date   GLUCOSE 133 (H) 06/12/2021   NA 139 06/12/2021   K 4.3 06/12/2021   CL 105 06/12/2021   CO2 24 06/12/2021   BUN 16 06/12/2021   CREATININE 0.85 06/12/2021   GFRNONAA >60 02/27/2021   GFRAA 60 (L) 08/12/2019   CALCIUM 9.6 06/12/2021   PROT 6.5 06/12/2021   ALBUMIN 3.4 (L) 02/19/2021   BILITOT 0.6 06/12/2021   ALKPHOS 108 02/19/2021   AST 11 06/12/2021   ALT 13 06/12/2021   ANIONGAP 7 02/27/2021     Crp: 8/26       9 (<1) 9/21       2.3 (<1) 10/04    10.21 (<0.5); esr 107 10/31      8.8 (<10) 11/28     4.1 (<8) 01/09     0.7 (<8)  Micro:  Serology:  Imaging: 8/16 mri lumbar spine 1. Increased T2 signal about and disruption of the left L4-L5 facets, with increased T2 signal in posterior L5 vertebral body, left pedicle, and surrounding soft tissues. Edema and osseous destruction cause mild spinal canal narrowing. This could represent acute fracture or infection. Correlate with history of trauma and consider CT for further osseous evaluation. 2. L2-L3 mild canal, moderate left neural foraminal narrowing and mild-to-moderate right neural foraminal narrowing.   8/18 tte  1. Left ventricular ejection fraction, by estimation, is 60 to 65%. The  left ventricle has normal function. The left ventricle has no regional  wall motion abnormalities. There is moderate asymmetric left ventricular  hypertrophy of the  basal-septal  segment. Left ventricular diastolic parameters were normal.   2. Right ventricular systolic function is normal. The right ventricular  size is normal. There is normal pulmonary artery systolic pressure. The  estimated right ventricular systolic pressure is 63.8 mmHg.   3. The mitral valve is normal in structure. Trivial mitral valve  regurgitation. No evidence of mitral stenosis.   4. The aortic valve was not well visualized. Aortic valve regurgitation  is not visualized. No aortic stenosis is present.   5. The inferior vena cava is normal in size with <50% respiratory  variability, suggesting right atrial pressure of 8 mmHg.   Conclusion(s)/Recommendation(s): No vegetation seen. If high clinical  suspicion for endocarditis, recommend TEE.    8/22 abdpelv ct with contrast 1. Progressive osteomyelitis at the left L4-5 facet joint, with evidence of ascending infection and probable epidural abscess and paraspinal phlegmon from L1 through L4. Follow-up MRI lumbar spine with and without contrast is recommended for more detailed evaluation. 2. Bilateral nonobstructing renal calculi. No evidence of obstructive uropathy within either kidney. 3. Indeterminate hypodensities right kidney, possibly hyperdense cysts, slightly increased since prior study. Follow-up renal MRI or CT could be performed if not previously evaluated. 4. Minimal gallbladder sludge.  No cholelithiasis or cholecystitis. 5.  Aortic Atherosclerosis  8/24 tee no endocarditis  9/19 mri lumbar spine 1. Severe discitis/osteomyelitis at L2-L3 (detailed above) with enhancing epidural phlegmon spanning from L1-L2 to L4-L5 (up to 1 cm thick ventrally) and into bilateral L2-L3 foramina. When superimposed on degenerative change, resulting severe canal stenosis at L2-L3 and moderate canal stenosis at the L2 and L3 levels. 2. Surrounding paraspinal myositis and phlegmon with 8 mm abscess in the right psoas at the L2-L3  level. 3. Mildly improved but persistent edema and enhancement about the left greater than right  L4-L5 facet joints, possibly septic arthritis given the above findings.  11/06 mri lumbar spine 1. L2-3 discitis/osteomyelitis with progressive L2 deformity but positive treatment response with improved bone marrow and paravertebral inflammation. There is notable disc space fluid of indeterminate sterility, especially where undermining a ventral osteophyte at the level of L2. 2. 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 Up to moderate spinal stenosis at L1-2 and L2-3.   07/03/21 mri lumbar spine 1. Further improvement in the bone marrow edema and paraspinal inflammatory changes associated with previously demonstrated L2-3 discitis and osteomyelitis. No progressive vertebral body collapse or recurrent fluid collection identified. 2. No new findings. 3. Stable multilevel spondylosis as detailed above    Assessment & Plan:   Problem List Items Addressed This Visit   None  No orders of the defined types were placed in this encounter.    Mssa bacteremia Lumbar spine osteomyelitis Psoas abscess right side      Abx: 11/02-c cefadroxil  8/18-11/02 cefazolin   8/16-18 vanc                                                        Assessment: 71 yo female lumbago, OA, htn/hlp, asthma, admitted 8/16 with progressive 7 day acute on chronic lower back pain, found to have mri imaging L4-5 vertebral om/discitis and mssa bacteremia   8/17 & 8/19 bcx mssa; 8/22 bcx ngtd 8/17 IR aspirate (left L4-5 facet joint) cx ngtd   Source usually unclear on these patients. But so far no obvious metastatic site of infection. Presumably this is hematogenous spread of mssa bacteremia.    8/18 tte no obvious valve vegetation; 8/24 tee negative for endocarditis   8/22 Ct of abd/pelv showed worsening lumbar spine epidural abscess. Clinically no LE neurological deficit or  bladder/bowel incontinence. Nsg reviewed cases and deemed no surgical intervention needed at this time   Overall ipmroving. Back pain minimal. LE spasm much improved. Pending disposition   ------------- 09/7 id assessment Doing well Some issues with nursing home timing of abx administration/or actual administration itself Awaiting labs from them Tenative date of stopping abx (iv) is 10/05 -- will see based on labs if needing to extend coverage with pills  There is no spinal abscess or other occult abscess   10/07 id assessment Mri 9/19 showed psoas abscess and phlegmon epidural space Abscess very small On 11/02 when finished with planned cefazolin will transition to cefadroxil for another 4 weeks and reevaluate with mri imaging prior to stopping abx all together  10/31 id assessment Doing well. Still wearing back brace from hospital. Many question but more appropriate for NSG. Need mri to see  resolution of abscess and determine duration of further abx. Crp was high, will repeat, but clinically appears infection suppressed at this time  11/28 id assessment Patient doing well on oral abx started last visit Due for labs today Reviewed the mri again from early 04/2021 -- some fluid collection mentioned but overall better than previous mri. No further psoas abscess or paraspinal myositis/phlegmon.   At this point continuing oral abx due to fluid collection. Plans to dc if normalized crp and mri without further fluid collection  No hardware present at surgical site. Right ankle does have orif but not involved.  06/12/2021 id assessment Clinically improving, less pain medication use Yet  to see NSG Repeat labs today Mri l-spine repeat in 3-4 weeks See me in 5-6 weeks Continue cefadroxil  08/02/21 id assessment With mri finding, previously normal crp, and continued clinical sx improvement, at this time it is safe to observe off antibiotics  Advise patient to monitor for worsening back  pain/fever, chill then to call our clinic as needed  Sx of relapse can happen up to several months after stopping abx but usually within the first few weeks   Follow-up: Return if symptoms worsen or fail to improve.       Jabier Mutton, [facility] [phone]  pager   [phone] cell 08/02/2021, 2:43 PM

## 2021-08-19 ENCOUNTER — Other Ambulatory Visit: Payer: Self-pay | Admitting: Internal Medicine

## 2021-08-21 ENCOUNTER — Telehealth: Payer: Self-pay

## 2021-08-21 NOTE — Telephone Encounter (Signed)
Left voicemail with patient requesting call back to discuss refill.  ?

## 2021-08-21 NOTE — Telephone Encounter (Signed)
Attempted to call patient regarding refill request for  cefadroxil. Per last note patient was supposed to stop antibiotics and monitor how she feels while off of them . ?Left voicemail requesting she call back office with update.  ?Leatrice Jewels, RMA ? ?

## 2021-08-24 ENCOUNTER — Telehealth: Payer: Self-pay | Admitting: Pulmonary Disease

## 2021-08-24 ENCOUNTER — Other Ambulatory Visit: Payer: Self-pay | Admitting: Pulmonary Disease

## 2021-08-25 MED ORDER — FLUTICASONE FUROATE-VILANTEROL 100-25 MCG/ACT IN AEPB: 1.0000 | INHALATION_SPRAY | Freq: Every day | RESPIRATORY_TRACT | 0 refills | Status: DC

## 2021-08-25 NOTE — Telephone Encounter (Signed)
I called the patient and let her know to keep the follow up to get further refills. Nothing further needed.  ?

## 2021-09-05 ENCOUNTER — Encounter: Payer: Self-pay | Admitting: Pulmonary Disease

## 2021-09-05 ENCOUNTER — Ambulatory Visit: Payer: Medicare Other | Admitting: Pulmonary Disease

## 2021-09-05 VITALS — BP 124/66 | HR 74 | Ht 66.0 in | Wt 197.8 lb

## 2021-09-05 DIAGNOSIS — J4530 Mild persistent asthma, uncomplicated: Secondary | ICD-10-CM

## 2021-09-05 MED ORDER — FLUTICASONE FUROATE-VILANTEROL 100-25 MCG/ACT IN AEPB: 1.0000 | INHALATION_SPRAY | Freq: Every day | RESPIRATORY_TRACT | 5 refills | Status: DC

## 2021-09-05 NOTE — Addendum Note (Signed)
Addended by: Jacquiline Doe on: 09/05/2021 10:20 AM ? ? Modules accepted: Orders ? ?

## 2021-09-05 NOTE — Patient Instructions (Signed)
I am glad you are doing well with your breathing ?We will renew the [facility] and Singulair ?Follow-up in 1 year. ?

## 2021-09-05 NOTE — Progress Notes (Signed)
? ?      ?[name]    [id number]    [birth year] ? ?Primary Care Physician:Briscoe, Sharrie Rothman, Singh ? ?Referring Physician: Macy Mis, Singh ?[address] ?[address] ?[place],  [place] [address] ? ?Chief complaint:   ?Follow up for Mild persistent asthma. ? ?HPI: ?Haley Singh is 71 year old with past medical history of asthma, hypertension, hyperlipidemia, depression. She has been diagnosed with asthma about 15 years ago. She was initially on Advair which was then switched to Qvar.  She was then changed to [facility] in 2018 as symptoms are not well controlled. ?She saw an allergist asked about 15 years ago and was told that she had sensitivity to dust, dogs, cats, weeds. ? ?Did not tolerate coming off of Breo in 2020. ?Also taken off Benzapril and started on losartan in 2020 with improvement in cough ? ?She used to work with IT support. She does not have any exposures at work or at home. She does not have any mold issues of pets. She is a nonsmoker. ? ?Interim History: ?Returns to clinic after an interval of 2 years.  She has been dealing with issues related to osteomyelitis of her lumbar spine with MSSA bacteremia with hospitalizations in 2022.  She was on prolonged antibiotic therapy as an outpatient and follows with infectious disease ? ?Overall breathing is stable.  Continues on Breo inhaler.  Hardly needs to use her rescue inhaler ? ?Outpatient Encounter Medications as of 09/05/2021  ?Medication Sig  ? amLODipine (NORVASC) 10 MG tablet Take 1 tablet (10 mg total) by mouth daily.  ? amLODipine (NORVASC) 5 MG tablet Take 5 mg by mouth daily.  ? atorvastatin (LIPITOR) 40 MG tablet Take 40 mg by mouth daily.   ? celecoxib (CELEBREX) 200 MG capsule Take by mouth.  ? cetirizine (ZYRTEC) 10 MG tablet Take 10 mg by mouth every morning.  ? fluticasone (FLONASE) 50 MCG/ACT nasal spray Place 1 spray into both nostrils 2 (two) times daily.   ? fluticasone furoate-vilanterol (BREO ELLIPTA) 100-25 MCG/ACT AEPB Inhale 1  puff into the lungs daily.  ? gabapentin (NEURONTIN) 300 MG capsule Take 600 mg by mouth at bedtime.  ? gabapentin (NEURONTIN) 400 MG capsule Take by mouth.  ? hydrALAZINE (APRESOLINE) 25 MG tablet Take 1 tablet (25 mg total) by mouth every 8 (eight) hours.  ? losartan (COZAAR) 100 MG tablet Take 1 tablet (100 mg total) by mouth daily.  ? montelukast (SINGULAIR) 10 MG tablet Take 10 mg by mouth daily.   ? traZODone (DESYREL) 150 MG tablet Take 150 mg by mouth at bedtime.   ? venlafaxine XR (EFFEXOR-XR) 150 MG 24 hr capsule Take 150 mg by mouth every morning.   ? cefadroxil (DURICEF) 500 MG capsule TAKE TWO CAPSULES BY MOUTH TWICE A DAY  ? potassium chloride SA (KLOR-CON) 20 MEQ tablet Take 2 tablets (40 mEq total) by mouth daily for 7 days.  ? [DISCONTINUED] polyethylene glycol (MIRALAX / GLYCOLAX) 17 g packet Take 17 g by mouth daily as needed.  ? ?No facility-administered encounter medications on file as of 09/05/2021.  ? ?Physical Exam: ?Blood pressure 124/66, pulse 74, height 5\' 6"  (1.676 m), weight 197 lb 12.8 oz (89.7 kg), SpO2 97 %. ?Gen:      No acute distress ?HEENT:  EOMI, sclera anicteric ?Neck:     No masses; no thyromegaly ?Lungs:    Clear to auscultation bilaterally; normal respiratory effort ?CV:         Regular  rate and rhythm; no murmurs ?Abd:      + bowel sounds; soft, non-tender; no palpable masses, no distension ?Ext:    No edema; adequate peripheral perfusion ?Skin:      Warm and dry; no rash ?Neuro: alert and oriented x 3 ?Psych: normal mood and affect  ? ?Data Reviewed: ?Sleep Study 05/19/13- This study did not show evidence for sleep disordered breathing. ? ?Chest x-ray 12/16/2020-patchy interstitial prominence with peribronchial cuffing ?I reviewed the images personally. ? ?PFTs: ?Spirometry 01/16/16 ?FVC 2.56 (74%), FEV1 1.88 [71%), F/F 73.3 ?No obstruction, restriction possible. ?                                                        ?PFTs 07/06/16 ?FVC 2.81 (84%), FEV1 2.37 (93%), F/F 84, TLC  113%, DLCO 64% ?Isolated reduction in diffusion capacity. ? ?FENO 05/17/16- 31 ?FENO 2/2/118- 23   ?FENO 05/10/17- 18 ?FENO 11/28/17-22 ? ?ACT score 09/05/2021- 23 ? ?CBC 02/22/2021-WBC 9.9, eos 0% ?Blood allergy profile 05/17/16- Postive for ragweed allergy. IgE 36 ? ?Assessment:  ?Chronic cough, secondary to ACE inhibitor.   ?Off Benzapril and on losartan improvement in symptoms. ? ?Mild persistent asthma ?Continue Breo, albuterol as needed ?Continue Singulair ? ?Allergies, postnasal drip ?Zyrtec, Flonase over-the-counter ? ?Plan/Recommendations: ?- Breo, albuterol as needed ?- Zyrtec, Singulair, Flonase. ? ?Haley Singh ?[facility] ?09/05/2021, 10:01 AM ? ?CC: Macy Mis, Singh  ? ? ? ? ? ?

## 2021-10-04 ENCOUNTER — Other Ambulatory Visit: Payer: Self-pay

## 2021-10-04 ENCOUNTER — Ambulatory Visit: Payer: Medicare Other | Admitting: Internal Medicine

## 2021-10-04 VITALS — BP 138/76 | HR 74 | Wt 205.0 lb

## 2021-10-04 DIAGNOSIS — A4901 Methicillin susceptible Staphylococcus aureus infection, unspecified site: Secondary | ICD-10-CM | POA: Diagnosis not present

## 2021-10-04 DIAGNOSIS — M4626 Osteomyelitis of vertebra, lumbar region: Secondary | ICD-10-CM

## 2021-10-04 DIAGNOSIS — K6812 Psoas muscle abscess: Secondary | ICD-10-CM

## 2021-10-04 NOTE — Patient Instructions (Addendum)
We will repeat blood test today -- if this is normal will discharge you from this clinic ? ? ?Continue to monitor for fever/chill, malaise, and increasing back pain for the next few weeks. If present please call our clinic ? ?Also if increasing more persistent shooting shock or anything out of the normal with your lower back or your lower extremities or urinary/bowel control, call us as well ? ? ? ?Thank you ?

## 2021-10-04 NOTE — Progress Notes (Addendum)
?  ? ? ? ? ?[name] ? ?Patient Active Problem List  ? Diagnosis Date Noted  ? Penicillin allergy   ? 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 Intractable nausea and vomiting 02/18/2021  ? Hypokalemia 02/18/2021  ? MSSA bacteremia   ? Discitis of lumbar region   ? Osteomyelitis ([place]) 01/17/2021  ? Mild persistent asthma without complication 03/50/0938  ? OSA (obstructive sleep apnea) 03/03/2014  ? ? ? ? ?Subjective:  ? ? Patient ID: Haley Singh, female    DOB: June 23, 1950, 71 y.o.   MRN: [id number] ? ?Chief Complaint  ?Patient presents with  ? Follow-up  ?  Acute osteomyelitis of lumbar spine  ? ? ?HPI: ? ?Haley Singh is a 71 y.o. female here for hospital f/u mssa bacteremia/epidural spinal abscess ? ? ?She is discharged on 8/26 to [facility] ?A week ago her picc has to be replaced. She said she missed "8" doses of cefazolin.  ? ?Labs were not sent today 02/08/21 visit ? ?No n/v/diarrhea ? ?--------- ?10/07 [facility] clinic f/u ?No labs accompanied patient from the [facility] ?Back pain is minimal ?Spasm in legs much better ?No f/c/n/v/diarrhea/rash ?She is to be on cefazolin till 11/02 ? ?10/31 id clinic f/u ?Crp was trending up slightly last visit ?Patient's current tentative plan to stop iv abx was 11/02, which she has been taking since 8/18  ?No issue with picc ?No n/v/diarrhea ?No fever chill ?No back pain sitting or lying down. Tingling in her legs gone ?Can't stand up straight "can't just extend her back." And there is some bilateral buttock increasing pain if she stands for a "long time." ?She didn't come with any paper work at all from [facility] ? ?11/28 [facility] clinic f/u ?Wearing brace of the lower back with activity ?No f/c/n/v/diarrhea/rash ?Has been taking cefadroxil ? ?06/12/21 [facility] clinic f/u ?Continues to improve in pain. Walks good distance, pain mostly with prolonged walking improved quickly with rest ?Not yet f/u nsg (next month) ?No  f/c ?No neurologic deficit ?Tolerating cefadroxil ? ?3/1 [facility] clinic f/u ?Still on antibiotics ?Mri reviewed again ?Feels like her back pain 2/10 seems "external/musculoskeletal" only if prolonged walking ?No f/c ? ?Saw her nsg and ok without wearing the back brace ?Will see her nsg again in 3 months ? ?10/04/21 [facility] clinic f/u ?Patient was stopped with abx on 08/02/21 id visit due to improved mri with and clinical findings/crp ?She is here for off-abx follow up ?She reports when she coughs she notices shooting electric shock front of both thighs. No back pain. Able to walk with a cane without worsening -- gradually better. When bending forward still have some trouble with tightness ?No fever/chill ?Eating well ? ? ? ?Allergies  ?Allergen Reactions  ? Oxycontin [Oxycodone] Itching  ? Tetracyclines & Related Itching  ? ? ? ? ?Outpatient Medications Prior to Visit  ?Medication Sig Dispense Refill  ? amLODipine (NORVASC) 10 MG tablet Take 1 tablet (10 mg total) by mouth daily.    ? amLODipine (NORVASC) 5 MG tablet Take 5 mg by mouth daily.    ? atorvastatin (LIPITOR) 40 MG tablet Take 40 mg by mouth daily.     ? cefadroxil (DURICEF) 500 MG capsule TAKE TWO CAPSULES BY MOUTH TWICE A DAY 120 capsule 0  ? celecoxib (CELEBREX) 200 MG capsule Take by mouth.    ? cetirizine (ZYRTEC) 10 MG tablet Take 10 mg by mouth every morning.    ?  fluticasone (FLONASE) 50 MCG/ACT nasal spray Place 1 spray into both nostrils 2 (two) times daily.     ? fluticasone furoate-vilanterol (BREO ELLIPTA) 100-25 MCG/ACT AEPB Inhale 1 puff into the lungs daily. 60 each 5  ? gabapentin (NEURONTIN) 300 MG capsule Take 600 mg by mouth at bedtime.    ? gabapentin (NEURONTIN) 400 MG capsule Take by mouth.    ? hydrALAZINE (APRESOLINE) 25 MG tablet Take 1 tablet (25 mg total) by mouth every 8 (eight) hours.    ? losartan (COZAAR) 100 MG tablet Take 1 tablet (100 mg total) by mouth daily.    ? montelukast (SINGULAIR) 10 MG tablet Take 10 mg by mouth daily.     ?  potassium chloride SA (KLOR-CON) 20 MEQ tablet Take 2 tablets (40 mEq total) by mouth daily for 7 days. 14 tablet 0  ? traZODone (DESYREL) 150 MG tablet Take 150 mg by mouth at bedtime.     ? venlafaxine XR (EFFEXOR-XR) 150 MG 24 hr capsule Take 150 mg by mouth every morning.     ? ?No facility-administered medications prior to visit.  ? ? ? ?Social History  ? ?Socioeconomic History  ? Marital status: Single  ?  Spouse name: Not on file  ? Number of children: Not on file  ? Years of education: Not on file  ? Highest education level: Not on file  ?Occupational History  ? Occupation: Retired  ?  Employer: RETIRED  ?Tobacco Use  ? Smoking status: Never  ? Smokeless tobacco: Never  ?Vaping Use  ? Vaping Use: Never used  ?Substance and Sexual Activity  ? Alcohol use: Yes  ?  Alcohol/week: 7.0 standard drinks  ?  Types: 7 Standard drinks or equivalent per week  ?  Comment: scotch or wine maybe 1 a night  ? Drug use: Never  ? Sexual activity: Not on file  ?Other Topics Concern  ? Not on file  ?Social History Narrative  ? Single, lives alone  ? No children  ? OCCUPATION: retired, IT support  ? ?Social Determinants of Health  ? ?Financial Resource Strain: Not on file  ?Food Insecurity: Not on file  ?Transportation Needs: Not on file  ?Physical Activity: Not on file  ?Stress: Not on file  ?Social Connections: Not on file  ?Intimate Partner Violence: Not on file  ? ? ? ? ?Review of Systems ?   ?Other ros negative ? ? ?Objective:  ?  ?There were no vitals taken for this visit. ?Nursing note and vital signs reviewed. ? ?Physical Exam ?General/constitutional: no distress, pleasant ?HEENT: Normocephalic, PER, Conj Clear, EOMI, Oropharynx clear ?Neck supple ?CV: rrr no mrg ?Lungs: clear to auscultation, normal respiratory effort ?Abd: Soft, Nontender ?Ext: no edema ?Skin: No Rash ?Neuro: nonfocal -- negative straight leg raise bilaterally; strength symmetric 5/5 bilateral LE ?MSK: no peripheral joint swelling/tenderness/warmth;  back spines nontender ? ? ? ?Labs: ?Lab Results  ?Component Value Date  ? WBC 4.5 06/12/2021  ? HGB 12.9 06/12/2021  ? HCT 38.0 06/12/2021  ? MCV 89.2 06/12/2021  ? PLT 239 06/12/2021  ? ?Last metabolic panel ?Lab Results  ?Component Value Date  ? GLUCOSE 133 (H) 06/12/2021  ? NA 139 06/12/2021  ? K 4.3 06/12/2021  ? CL 105 06/12/2021  ? CO2 24 06/12/2021  ? BUN 16 06/12/2021  ? CREATININE 0.85 06/12/2021  ? GFRNONAA >60 02/27/2021  ? GFRAA 60 (L) 08/12/2019  ? CALCIUM 9.6 06/12/2021  ? PROT 6.5 06/12/2021  ? ALBUMIN  3.4 (L) 02/19/2021  ? BILITOT 0.6 06/12/2021  ? ALKPHOS 108 02/19/2021  ? AST 11 06/12/2021  ? ALT 13 06/12/2021  ? ANIONGAP 7 02/27/2021  ? ? ? ?Crp: ?8/26       9 (<1) ?9/21       2.3 (<1) ?10/04    10.21 (<0.5); esr 107 ?10/31      8.8 (<10) ?11/28     4.1 (<8) ?01/09     0.7 (<8) ? ?Micro: ? ?Serology: ? ?Imaging: ?8/16 mri lumbar spine ?1. Increased T2 signal about and disruption of the left L4-L5 ?facets, with increased T2 signal in posterior L5 vertebral body, ?left pedicle, and surrounding soft tissues. Edema and osseous ?destruction cause mild spinal canal narrowing. This could represent ?acute fracture or infection. Correlate with history of trauma and ?consider CT for further osseous evaluation. ?2. L2-L3 mild canal, moderate left neural foraminal narrowing and ?mild-to-moderate right neural foraminal narrowing. ?  ?8/18 tte ? 1. Left ventricular ejection fraction, by estimation, is 60 to 65%. The  ?left ventricle has normal function. The left ventricle has no regional  ?wall motion abnormalities. There is moderate asymmetric left ventricular  ?hypertrophy of the basal-septal  ?segment. Left ventricular diastolic parameters were normal.  ? 2. Right ventricular systolic function is normal. The right ventricular  ?size is normal. There is normal pulmonary artery systolic pressure. The  ?estimated right ventricular systolic pressure is 70.4 mmHg.  ? 3. The mitral valve is normal in structure.  Trivial mitral valve  ?regurgitation. No evidence of mitral stenosis.  ? 4. The aortic valve was not well visualized. Aortic valve regurgitation  ?is not visualized. No aortic stenosis is present.  ? 5. The i

## 2021-10-05 ENCOUNTER — Telehealth: Payer: Self-pay

## 2021-10-05 LAB — COMPLETE METABOLIC PANEL WITH GFR
AG Ratio: 1.5 (calc) (ref 1.0–2.5)
ALT: 15 U/L (ref 6–29)
AST: 10 U/L (ref 10–35)
Albumin: 3.7 g/dL (ref 3.6–5.1)
Alkaline phosphatase (APISO): 104 U/L (ref 37–153)
BUN/Creatinine Ratio: 13 (calc) (ref 6–22)
BUN: 15 mg/dL (ref 7–25)
CO2: 26 mmol/L (ref 20–32)
Calcium: 8.3 mg/dL — ABNORMAL LOW (ref 8.6–10.4)
Chloride: 107 mmol/L (ref 98–110)
Creat: 1.13 mg/dL — ABNORMAL HIGH (ref 0.60–1.00)
Globulin: 2.5 g/dL (calc) (ref 1.9–3.7)
Glucose, Bld: 99 mg/dL (ref 65–99)
Potassium: 4.1 mmol/L (ref 3.5–5.3)
Sodium: 142 mmol/L (ref 135–146)
Total Bilirubin: 0.4 mg/dL (ref 0.2–1.2)
Total Protein: 6.2 g/dL (ref 6.1–8.1)
eGFR: 51 mL/min/{1.73_m2} — ABNORMAL LOW (ref >=60)

## 2021-10-05 LAB — C-REACTIVE PROTEIN: CRP: 14.9 mg/L — ABNORMAL HIGH (ref <8.0)

## 2021-10-05 LAB — CBC
HCT: 36.2 % (ref 35.0–45.0)
Hemoglobin: 12.2 g/dL (ref 11.7–15.5)
MCH: 30 pg (ref 27.0–33.0)
MCHC: 33.7 g/dL (ref 32.0–36.0)
MCV: 88.9 fL (ref 80.0–100.0)
MPV: 8.3 fL (ref 7.5–12.5)
Platelets: 304 10*3/uL (ref 140–400)
RBC: 4.07 10*6/uL (ref 3.80–5.10)
RDW: 12.4 % (ref 11.0–15.0)
WBC: 7.9 10*3/uL (ref 3.8–10.8)

## 2021-10-05 NOTE — Telephone Encounter (Signed)
Spoke with patient, she states she was on cirpo for one week and finished the course Monday (5/1). ? ?Sandie Ano, RN ? ?

## 2021-10-05 NOTE — Telephone Encounter (Signed)
Thanks Megan!

## 2021-11-16 ENCOUNTER — Encounter: Payer: Self-pay | Admitting: Physical Medicine & Rehabilitation

## 2021-12-22 ENCOUNTER — Encounter: Payer: Medicare Other | Admitting: Physical Medicine & Rehabilitation

## 2022-01-31 ENCOUNTER — Encounter: Payer: Medicare Other | Attending: Physical Medicine & Rehabilitation | Admitting: Physical Medicine and Rehabilitation

## 2022-01-31 ENCOUNTER — Encounter: Payer: Self-pay | Admitting: Physical Medicine and Rehabilitation

## 2022-01-31 ENCOUNTER — Other Ambulatory Visit: Payer: Self-pay | Admitting: Physical Medicine and Rehabilitation

## 2022-01-31 VITALS — BP 132/80 | HR 94 | Ht 66.0 in | Wt 216.8 lb

## 2022-01-31 DIAGNOSIS — M5489 Other dorsalgia: Secondary | ICD-10-CM | POA: Insufficient documentation

## 2022-01-31 DIAGNOSIS — M4620 Osteomyelitis of vertebra, site unspecified: Secondary | ICD-10-CM | POA: Diagnosis not present

## 2022-01-31 DIAGNOSIS — R635 Abnormal weight gain: Secondary | ICD-10-CM | POA: Diagnosis not present

## 2022-01-31 DIAGNOSIS — T733XXA Exhaustion due to excessive exertion, initial encounter: Secondary | ICD-10-CM | POA: Insufficient documentation

## 2022-01-31 NOTE — Progress Notes (Unsigned)
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 She states when Dr. Earnest Bailey made the referral, she was in pain but that is no longer the case. She has persistent fatigue, which is the most bothersome thing for her. She also has occassional muscle spasms, but these self resolved.    She has been getting massages every few weeks to stretch her Psoas, which is helping with her posture.   Her osteomyelitis was at L2-3, and she also has a bulging disc at L2. She was taking oxycodone at the time 2-3x daily, but has been off of it since January 2023.   Function/social: Lives in 1 3 story town home by [address]. Used to work part time at [employer], currently unemployed. She used to do woodworking and photography, but does not anymore because heat tends to make her fatigue worse.   She is on venlafaxane for anger and it is very effect.   Pain Inventory Average Pain 0 Pain Right Now 0 My pain is constant and dull  In the last 24 hours, has pain interfered with the following? General activity 0 Relation with others 0 Enjoyment of life 0 What TIME of day is your pain at its worst? varies Sleep (in general) Good  Pain is worse with: walking and standing Pain improves with: rest and heat/ice Relief from Meds: 0  walk with assistance use a cane how many minutes can you walk?  10 ability to climb steps?  yes do you drive?  yes transfers alone  not employed: date last employed .  numbness  Any changes since last visit?  no  Any changes since last visit?  no    Family History  Problem Relation Age of Onset   Breast cancer Maternal Aunt    Heart disease Father    Heart Problems Father    Social History   Socioeconomic History   Marital status: Single    Spouse name: Not on file   Number of children: Not on file   Years of education: Not on file   Highest education level: Not on file  Occupational History   Occupation: Retired    Associate Professor: RETIRED  Tobacco Use   Smoking status: Never   Smokeless tobacco: Never  Vaping Use   Vaping Use: Never used  Substance and Sexual Activity   Alcohol use: Yes    Alcohol/week: 7.0 standard drinks of alcohol    Types: 7 Standard drinks or equivalent per week    Comment: scotch or wine maybe 1 a night   Drug use: Never   Sexual activity: Not on file  Other Topics Concern   Not on file  Social History Narrative   Single, lives alone   No children  OCCUPATION: retired, IT support   Social Determinants of Corporate investment banker Strain: Not on file  Food Insecurity: Not on file  Transportation Needs: Not on file  Physical Activity: Not on file  Stress: Not on file  Social Connections: Not on file   Past Surgical History:  Procedure Laterality Date   BUBBLE STUDY  01/25/2021   Procedure: BUBBLE STUDY;  Surgeon: Jodelle Red, MD;  Location: [facility] END[facility]COPY;  Service: Cardiovascular;;   CATARACT EXTRACTION W/ INTRAOCULAR LENS  IMPLANT, BILATERAL  2009; 2010   CYSTOSCOPY/URETEROSCOPY/HOLMIUM LASER/STENT PLACEMENT Right 08/26/2019   Procedure: CYSTOSCOPY/RETROGRADE/URETEROSCOPY/HOLMIUM LASER/STENT PLACEMENT;  Surgeon: Rene Paci, MD;  Location: [facility];  Service: Urology;  Laterality: Right;   CYSTOSCOPY/URETEROSCOPY/HOLMIUM LASER/STENT PLACEMENT Left  11/17/2019   Procedure: CYSTOSCOPY/URETEROSCOPY, RETROGRADE,LASER LITHOTRIPSY, STENT PLACEMENT;  Surgeon: Rene Paci, MD;  Location: [facility];  Service: Urology;  Laterality: Left;   CYSTOSCOPY/URETEROSCOPY/HOLMIUM LASER/STENT PLACEMENT Left 02/19/2021   Procedure: CYSTOSCOPY/URETEROSCOPY/HOLMIUM LASER/STENT PLACEMENT;  Surgeon: Crista Elliot, MD;  Location: [facility];  Service: Urology;  Laterality: Left;   EXPLORATORY LAPAROTOMY W/ BOWEL RESECTION  01-15-2004  @[facility]    small bowel resection for stricture/ benzoar   EXTRACORPOREAL SHOCK WAVE LITHOTRIPSY Right 03/27/2018   Procedure: EXTRACORPOREAL SHOCK WAVE LITHOTRIPSY (ESWL);  Surgeon: 03/29/2018, MD;  Location: [facility];  Service: Urology;  Laterality: Right;   EXTRACORPOREAL SHOCK WAVE LITHOTRIPSY  x2 2008;  2009;  2015   HOLMIUM LASER APPLICATION Left 11/17/2019   Procedure: HOLMIUM LASER APPLICATION;  Surgeon: 11/19/2019, MD;  Location[facility];  Service: Urology;  Laterality: Left;   IR FLUORO GUIDED NEEDLE PLC ASPIRATION/INJECTION LOC  02/22/2021   ORIF ANKLE FRACTURE Right 09/ 2008 @[facility]    per pt has retained hardware   TEE WITHOUT CARDIOVERSION N/A 01/25/2021   Procedure: TRANSESOPHAGEAL ECHOCARDIOGRAM (TEE);  Surgeon: , MD;  Location: [facility] ENDOSCOPY;  Service: Cardiovascular;  Laterality: N/A;   TONSILLECTOMY  age 32   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   Hyperlipidemia    Hypertension    followed by pcp  (11-10-2019 pt stated never had a stress test)   Insomnia    MDD (major depressive disorder)    Mild persistent asthma    followed by pcp and dr 02/2004 (pulmonologist)   RLS  (restless legs syndrome)    Vertebral osteomyelitis ([facility]) 02/21/2021   Ht 5\' 6"  (1.676 m)   Wt 216 lb 12.8 oz (98.3 kg)   BMI 34.99 kg/m   Opioid Risk Score:   Fall Risk Score:  `1  Depression screen [facility] 2/9     01/31/2022    2:19 PM 10/04/2021    9:08 AM 08/02/2021    2:28 PM 03/10/2021   11:39 AM  Depression screen PHQ 2/9  Decreased Interest 0 0 0 0  Down, Depressed, Hopeless 0 0 0 0  PHQ - 2 Score 0 0 0 0  Altered sleeping 0     Tired, decreased energy 3     Change in appetite 0     Feeling bad or failure about yourself  0     Trouble concentrating 0  Moving slowly or fidgety/restless 0     Suicidal thoughts 0     PHQ-9 Score 3     Difficult doing work/chores Somewhat difficult

## 2022-01-31 NOTE — Patient Instructions (Signed)
You have been prescribed PT for improving your endurance.   You also have been given a script or bloodwork, which can be completed at time of check out. You will be called with results when they are available.  You will follow up in 2-3 months.

## 2022-02-01 LAB — CBC WITH DIFFERENTIAL/PLATELET
Basophils Absolute: 0 10*3/uL (ref 0.0–0.2)
Basos: 0 %
EOS (ABSOLUTE): 0 10*3/uL (ref 0.0–0.4)
Eos: 0 %
Hematocrit: 42 % (ref 34.0–46.6)
Hemoglobin: 14.1 g/dL (ref 11.1–15.9)
Immature Grans (Abs): 0.1 10*3/uL (ref 0.0–0.1)
Immature Granulocytes: 1 %
Lymphocytes Absolute: 0.8 10*3/uL (ref 0.7–3.1)
Lymphs: 9 %
MCH: 30 pg (ref 26.6–33.0)
MCHC: 33.6 g/dL (ref 31.5–35.7)
MCV: 89 fL (ref 79–97)
Monocytes Absolute: 0.7 10*3/uL (ref 0.1–0.9)
Monocytes: 8 %
Neutrophils Absolute: 7.4 10*3/uL — ABNORMAL HIGH (ref 1.4–7.0)
Neutrophils: 82 %
Platelets: 194 10*3/uL (ref 150–450)
RBC: 4.7 x10E6/uL (ref 3.77–5.28)
RDW: 12.7 % (ref 11.7–15.4)
WBC: 9 10*3/uL (ref 3.4–10.8)

## 2022-02-01 LAB — COMPREHENSIVE METABOLIC PANEL
ALT: 17 IU/L (ref 0–32)
AST: 14 IU/L (ref 0–40)
Albumin/Globulin Ratio: 1.8 (ref 1.2–2.2)
Albumin: 4.5 g/dL (ref 3.8–4.8)
Alkaline Phosphatase: 125 IU/L — ABNORMAL HIGH (ref 44–121)
BUN/Creatinine Ratio: 11 — ABNORMAL LOW (ref 12–28)
BUN: 12 mg/dL (ref 8–27)
Bilirubin Total: 0.5 mg/dL (ref 0.0–1.2)
CO2: 22 mmol/L (ref 20–29)
Calcium: 9.4 mg/dL (ref 8.7–10.3)
Chloride: 104 mmol/L (ref 96–106)
Creatinine, Ser: 1.09 mg/dL — ABNORMAL HIGH (ref 0.57–1.00)
Globulin, Total: 2.5 g/dL (ref 1.5–4.5)
Glucose: 122 mg/dL — ABNORMAL HIGH (ref 70–99)
Potassium: 4.2 mmol/L (ref 3.5–5.2)
Sodium: 142 mmol/L (ref 134–144)
Total Protein: 7 g/dL (ref 6.0–8.5)
eGFR: 54 mL/min/{1.73_m2} — ABNORMAL LOW (ref >59)

## 2022-02-01 LAB — B12 AND FOLATE PANEL
Folate: 15.2 ng/mL (ref >3.0)
Vitamin B-12: 422 pg/mL (ref 232–1245)

## 2022-02-01 LAB — TSH: TSH: 1.65 u[IU]/mL (ref 0.450–4.500)

## 2022-02-01 LAB — T4, FREE: Free T4: 1.36 ng/dL (ref 0.82–1.77)

## 2022-02-01 LAB — MAGNESIUM: Magnesium: 1.9 mg/dL (ref 1.6–2.3)

## 2022-02-01 NOTE — Assessment & Plan Note (Signed)
Likely d/t inactivity, debility. Labwork ordered as above.

## 2022-02-01 NOTE — Assessment & Plan Note (Signed)
Likely secondary to deconditioning from prolonged illness related to osteomyelitis  Ordered script for OP PT for endurance, core strengthening, and improving back ROM   Labs ordered CBC, CMP, mag, TSH/T4, B12 and Folate for generalized fatigue and weight gain workup  Follow up in 2 months. If no improvement in symptoms with PT, will consider additional neuroimaging and medication adjustments.

## 2022-02-01 NOTE — Assessment & Plan Note (Signed)
Has finished treatment and currently following with Dr. Malon Kindle, Dced from [facility] 10/2021  Initially had persistent myalgias but currently denies any pain.  No medication changes indicated at this time.

## 2022-02-02 ENCOUNTER — Telehealth: Payer: Self-pay | Admitting: Physical Medicine and Rehabilitation

## 2022-02-02 DIAGNOSIS — T733XXA Exhaustion due to excessive exertion, initial encounter: Secondary | ICD-10-CM

## 2022-02-02 NOTE — Telephone Encounter (Signed)
Left VM of lab results; discussed mild increase in Cr potentially from dehydration but all other labs look unconcerning.   Angelina Sheriff, DO 02/02/2022

## 2022-02-19 ENCOUNTER — Ambulatory Visit: Payer: Medicare Other | Admitting: Physical Medicine & Rehabilitation

## 2022-02-19 NOTE — Therapy (Unsigned)
OUTPATIENT PHYSICAL THERAPY THORACOLUMBAR EVALUATION   Patient Name: Haley Singh MRN: [id number] DOB:07/11/1950, 71 y.o., female Today's Date: 02/20/2022   PT End of Session - 02/20/22 1445     Visit Number 1    Number of Visits 12    Date for PT Re-Evaluation 04/17/22    Authorization Type [employer]    PT Start Time 1400    PT Stop Time [id number]    PT Time Calculation (min) 45 min    Activity Tolerance Patient tolerated treatment well    Behavior During Therapy [facility] for tasks assessed/performed             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   Hyperlipidemia    Hypertension    followed by pcp  (11-10-2019 pt stated never had a stress test)   Insomnia    MDD (major depressive disorder)    Mild persistent asthma    followed by pcp and dr Bernita Raisin (pulmonologist)   RLS (restless legs syndrome)    Vertebral osteomyelitis ([place]) 02/21/2021   Past Surgical History:  Procedure Laterality Date   BUBBLE STUDY  01/25/2021   Procedure: BUBBLE STUDY;  Surgeon: Haley Dresser, MD;  Location: [facility];  Service: Cardiovascular;;   CATARACT EXTRACTION W/ INTRAOCULAR LENS  IMPLANT, BILATERAL  2009; 2010   CYSTOSCOPY/URETEROSCOPY/HOLMIUM LASER/STENT PLACEMENT Right 08/26/2019   Procedure: CYSTOSCOPY/RETROGRADE/URETEROSCOPY/HOLMIUM LASER/STENT PLACEMENT;  Surgeon: Haley Mons, MD;  Location: [facility];  Service: Urology;  Laterality: Right;   CYSTOSCOPY/URETEROSCOPY/HOLMIUM LASER/STENT PLACEMENT Left 11/17/2019   Procedure: CYSTOSCOPY/URETEROSCOPY, RETROGRADE,LASER LITHOTRIPSY, STENT PLACEMENT;  Surgeon: Haley Mons, MD;  Location: [facility];  Service: Urology;   Laterality: Left;   CYSTOSCOPY/URETEROSCOPY/HOLMIUM LASER/STENT PLACEMENT Left 02/19/2021   Procedure: CYSTOSCOPY/URETEROSCOPY/HOLMIUM LASER/STENT PLACEMENT;  Surgeon: Haley Mallow, MD;  Location: [facility];  Service: Urology;  Laterality: Left;   EXPLORATORY LAPAROTOMY W/ BOWEL RESECTION  01-15-2004  @[facility]    small bowel resection for stricture/ benzoar   EXTRACORPOREAL SHOCK WAVE LITHOTRIPSY Right 03/27/2018   Procedure: EXTRACORPOREAL SHOCK WAVE LITHOTRIPSY (ESWL);  Surgeon: Haley Hughs, MD;  Location: [facility];  Service: Urology;  Laterality: Right;   EXTRACORPOREAL SHOCK WAVE LITHOTRIPSY  x2 2008;  2009;  2015   HOLMIUM LASER APPLICATION Left [id number]   Procedure: HOLMIUM LASER APPLICATION;  Surgeon: Haley Mons, MD;  Location: [facility];  Service: Urology;  Laterality: Left;   IR FLUORO GUIDED NEEDLE PLC ASPIRATION/INJECTION LOC  02/22/2021   ORIF ANKLE FRACTURE Right 09/ 2008 @[facility]    per pt has retained hardware   TEE WITHOUT CARDIOVERSION N/A 01/25/2021   Procedure: TRANSESOPHAGEAL ECHOCARDIOGRAM (TEE);  Surgeon: Haley Dresser, MD;  Location: [facility];  Service: Cardiovascular;  Laterality: N/A;   TONSILLECTOMY  age 71   Patient Active Problem List   Diagnosis Date Noted   Fatigue due to excessive exertion 01/31/2022   Weight gain 01/31/2022   Penicillin allergy    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   Vertebral osteomyelitis ([facility]) 02/21/2021   Intractable nausea and vomiting 02/18/2021   Hypokalemia 02/18/2021   MSSA bacteremia    Discitis of lumbar region    Osteomyelitis ([place]) 01/17/2021   Mild  persistent asthma without complication [id number]   OSA (obstructive sleep apnea) 03/03/2014    PCP: Haley Mires, MD  REFERRING PROVIDER: Gertie Gowda, DO  REFERRING DIAG: [facility].3XXA (ICD-10-CM) - Fatigue due to excessive exertion, initial encounter M54.89 (ICD-10-CM) - Midline back pain, unspecified back location, unspecified  chronicity   Rationale for Evaluation and Treatment Rehabilitation  THERAPY DIAG: Midline back pain, unspecified back location, unspecified chronicity   ONSET DATE: chronic  SUBJECTIVE:                                                                                                                                                                                           SUBJECTIVE STATEMENT: Reports inability to maintain an upright posture due to tight hip flexors, has gotten progressively weaker over the past 6 months, low back pain has resolved PERTINENT HISTORY:  Haley Singh is a 71 y/o female presenting to PM&R clinic as a referral from her PCP [facility] for pain s/p osteomyelitis . She states when they made the referral, she was in pain but that is no longer the case. She has persistent fatigue, which is the most bothersome thing for her. She also has occassional muscle spasms, but these self resolved.    Fatigue: Has been ongoing and debilitating over the past year. She has not gone to PT, had any medications, or other treatments specifically for fatigue. Does endorse family Hx hypothyroidism, but no personal Hx. Has had some mild weight gain with inactivity. She also endorses poor tolerance of hot weather, but denies any fatigue or pain associated with it. She denies any associated weakness, sensory changes, pain, diplopia or pruritis.    Pain: Her osteomyelitis was at L2-3, and she also has a bulging disc at L2. She was taking oxycodone at the time 2-3x daily in acute phase, but has been off of it since January 2023. She has been getting massages every few weeks to stretch her Psoas, which is helping with her posture.    Function/social: Lives in a 3 story town home by [employer]. Used to work part time at [employer], currently unemployed. She used to do woodworking and photography, but does not anymore because heat tends to make her fatigue worse. She used to exercise in her complex pool,  but has also stopped doing that due to heat. She is independent of mobility and ADLs.    She is on venlafaxane for anger and it is very effect.   PAIN:  Are you having pain? no  PRECAUTIONS: Other: osteomyelitis  WEIGHT BEARING RESTRICTIONS No  FALLS:  Has patient fallen in last 6 months? No  LIVING ENVIRONMENT: Lives with: lives alone  OCCUPATION: not working  PLOF: Independent  PATIENT GOALS To return to ADLs and wood working   OBJECTIVE:   DIAGNOSTIC FINDINGS:  EXAM: MRI LUMBAR SPINE WITHOUT CONTRAST   TECHNIQUE: Multiplanar, multisequence MR imaging of the lumbar spine was performed. No intravenous contrast was administered.   COMPARISON:  MR lumbar 04/09/2021 and 02/20/2021; CT lumbar 01/17/2021; X-ray lumbar 05/23/2011.   FINDINGS: Despite efforts by the technologist and patient, mild motion artifact is present on today's exam and could not be eliminated. This reduces exam sensitivity and specificity.   Segmentation: Conventional anatomy assumed, with the last open disc space designated L5-S1.Concordant with previous imaging.   Alignment: Stable chronic degenerative grade 1 anterolisthesis at L4-5 and mild convex right scoliosis. Mild retrolisthesis at L1-2 is unchanged.   Vertebrae: Again demonstrated are chronic sequela of discitis and osteomyelitis at L2-3 with partial collapse of the L2 and L3 vertebral bodies and irregularity of the intervening disc space. The associated marrow edema has improved, and no progressive bone destruction identified. Stable chronic endplate degenerative changes at L1-2. No new foci of osteomyelitis identified. Stable mild degenerative changes of the sacroiliac joints.   Conus medullaris: Extends to the L1-2 level and appears normal. Stable sacral Tarlov cysts.   Paraspinal and other soft tissues: Improved paraspinal inflammatory changes at L2-3. No focal paraspinal or epidural fluid collection.   Disc levels:   No  significant disc space findings from T9-10 through T12-L1 aside from anterior osteophytes and scattered perineural cysts.   L1-2: Stable chronic degenerative disc disease with loss of disc height, annular disc bulging and endplate osteophytes. There is moderate facet and ligamentous hypertrophy contributing to stable mild spinal stenosis and mild narrowing of the lateral recesses and foramina bilaterally.   L2-3: As above, improving changes of diskitis/osteomyelitis. Stable annular disc bulging, endplate osteophytes, facet and ligamentous hypertrophy contributing to mild-to-moderate spinal stenosis. There is stable lateral recess and foraminal narrowing bilaterally. No epidural fluid collection identified.   L3-4: Stable annular disc bulging, facet and ligamentous hypertrophy contributing to mild spinal stenosis.   L4-5: Chronic loss of disc height with annular disc bulging and advanced facet hypertrophy, accounting for the grade 1 anterolisthesis. Stable mild foraminal narrowing bilaterally.   L5-S1: Stable chronic degenerative disc disease with posterior osteophytes and mild facet hypertrophy. No spinal stenosis or nerve root encroachment.   IMPRESSION: 1. Further improvement in the bone marrow edema and paraspinal inflammatory changes associated with previously demonstrated L2-3 discitis and osteomyelitis. No progressive vertebral body collapse or recurrent fluid collection identified. 2. No new findings. 3. Stable multilevel spondylosis as detailed above.     Electronically Signed   By: Richardean Sale M.D.   On: 07/04/2021 10:51  PATIENT SURVEYS:  FOTO 55(64 predicted)  SCREENING FOR RED FLAGS: negative  COGNITION:  Overall cognitive status: Within functional limits for tasks assessed     SENSATION: Not tested  MUSCLE LENGTH: Hamstrings: Right 90 deg; Left 90 deg Thomas test: -20d from neutral B  POSTURE:  patient demos a forward flexed  posture  PALPATION: Not tested  LUMBAR ROM:   Active  A/PROM  eval  Flexion WNL  Extension -25% from neutral  Right lateral flexion   Left lateral flexion   Right rotation   Left rotation    (Blank rows = not tested)  LOWER EXTREMITY ROM:     Active  Right eval Left eval  Hip flexion -20d -20d  Hip extension    Hip  abduction    Hip adduction    Hip internal rotation    Hip external rotation    Knee flexion    Knee extension    Ankle dorsiflexion    Ankle plantarflexion    Ankle inversion    Ankle eversion     (Blank rows = not tested)  LOWER EXTREMITY MMT:    MMT Right eval Left eval  Hip flexion    Hip extension    Hip abduction    Hip adduction    Hip internal rotation    Hip external rotation    Knee flexion    Knee extension    Ankle dorsiflexion    Ankle plantarflexion    Ankle inversion    Ankle eversion    Core/trunk flexion 3 3   (Blank rows = not tested)  LUMBAR SPECIAL TESTS:  Straight leg raise test: Negative, Slump test: Negative, Single leg stance test: Negative, FABER test: Positive, and Thomas test: Positive  FUNCTIONAL TESTS:  5 times sit to stand: 16s with arms crossed  GAIT: Distance walked: 56ft x2 Assistive device utilized: None Level of assistance: Complete Independence Comments: antalgic     TODAY'S TREATMENT  Eval and HEP   PATIENT EDUCATION:  Education details: Discussed eval findings, rehab rationale and POC and patient is in agreement  Person educated: Patient Education method: Explanation Education comprehension: verbalized understanding and needs further education   HOME EXERCISE PROGRAM: Access Code: [email or web address] URL: [email or web address] Date: 02/20/2022 Prepared by: Sharlynn Oliphant  Exercises - Hip Flexor Stretch at [facility] of Bed  - 2 x daily - 5 x weekly - 1 sets - 3 reps - 30s hold - Supine Posterior Pelvic Tilt  - 2 x daily - 5 x weekly - 1 sets - 15 reps - Supine Bridge  - 2 x daily -  5 x weekly - 1 sets - 15 reps  ASSESSMENT:  CLINICAL IMPRESSION: Patient is a 71 y.o. female who was seen today for physical therapy evaluation and treatment for low back pain and postural dysfunction. Patient presents with tight hip flexors and marked weakness of trunk and core musculature.  Undergoes bi-weekly massages which provide temporary relief. Unable to lie prone due to discomfort.    OBJECTIVE IMPAIRMENTS Abnormal gait, decreased activity tolerance, decreased balance, decreased endurance, decreased knowledge of condition, decreased mobility, difficulty walking, decreased ROM, increased muscle spasms, improper body mechanics, postural dysfunction, and pain.   ACTIVITY LIMITATIONS carrying, lifting, standing, and bed mobility  PERSONAL FACTORS Age, Past/current experiences, Time since onset of injury/illness/exacerbation, and 1 comorbidity: osteomyelitis  are also affecting patient's functional outcome.   REHAB POTENTIAL: Good  CLINICAL DECISION MAKING: Evolving/moderate complexity  EVALUATION COMPLEXITY: Moderate   GOALS: Goals reviewed with patient? Yes  SHORT TERM GOALS: Target date: 03/13/2022  Patient to demonstrate independence in HEP  Baseline:[id number] Goal status: INITIAL  2.  Increase B hip extension to -10d from neutral Baseline: -20d from neutral Goal status: INITIAL  3.  Patient to perform 10 curl ups Baseline: 1 performed at eval Goal status: INITIAL    LONG TERM GOALS: Target date: 04/03/2022  0d hip extension  Baseline: -20d from neutral Goal status: INITIAL  2.  Decrease 5x STS time to <15s Baseline: 16s arms crossed Goal status: INITIAL  3.  Increase core strength to 3+/5 Baseline: 3/5 as evidenced by inability to perform curl up Goal status: INITIAL  4.  Increase FOTO score to 64 Baseline: 55 Goal status: INITIAL  PLAN: PT FREQUENCY: 2x/week  PT DURATION: 6 weeks  PLANNED INTERVENTIONS: Therapeutic exercises, Therapeutic  activity, Neuromuscular re-education, Balance training, Gait training, Patient/Family education, Self Care, Joint mobilization, Stair training, DME instructions, Aquatic Therapy, Dry Needling, Spinal mobilization, Manual therapy, and Re-evaluation.  PLAN FOR NEXT SESSION: HEP review and update, core strength, hip stretch, body mechanics training   Lanice Shirts, PT 02/20/2022, 3:47 PM

## 2022-02-20 ENCOUNTER — Ambulatory Visit: Payer: Medicare Other | Attending: Physical Medicine and Rehabilitation

## 2022-02-20 DIAGNOSIS — T733XXA Exhaustion due to excessive exertion, initial encounter: Secondary | ICD-10-CM | POA: Insufficient documentation

## 2022-02-20 DIAGNOSIS — M791 Myalgia, unspecified site: Secondary | ICD-10-CM | POA: Insufficient documentation

## 2022-02-20 DIAGNOSIS — R5381 Other malaise: Secondary | ICD-10-CM | POA: Diagnosis present

## 2022-02-20 DIAGNOSIS — M5489 Other dorsalgia: Secondary | ICD-10-CM | POA: Insufficient documentation

## 2022-02-20 DIAGNOSIS — M5459 Other low back pain: Secondary | ICD-10-CM | POA: Insufficient documentation

## 2022-02-20 DIAGNOSIS — M868X8 Other osteomyelitis, other site: Secondary | ICD-10-CM | POA: Insufficient documentation

## 2022-02-20 NOTE — Patient Instructions (Signed)
Aquatic Therapy at [facility]-  What to Expect!  Where:   [facility] [address] [facility] [place], [place] [address] Rehab phone [phone]  NOTE:  You will receive an automated phone message reminding you of your appt and it will say the appointment is at the [address] [facility] [address] [facility] clinic.          How to Prepare: Please make sure you drink 8 ounces of water about one hour prior to your pool session A caregiver may attend if needed with the patient to help assist as needed. A caregiver can sit in the pool room on chair. Please arrive IN YOUR SUIT and 15 minutes prior to your appointment - this helps to avoid delays in starting your session. Please make sure to attend to any toileting needs prior to entering the pool Locker rooms for changing are provided.   There is direct access to the pool deck form the locker room.  You can lock your belongings in a locker with lock provided. Once on the pool deck your therapist will ask if you have signed the Patient  Consent and Assignment of Benefits form before beginning treatment Your therapist may take your blood pressure prior to, during and after your session if indicated We usually try and create a home exercise program based on activities we do in the pool.  Please be thinking about who might be able to assist you in the pool should you need to participate in an aquatic home exercise program at the time of discharge if you need assistance.  Some patients do not want to or do not have the ability to participate in an aquatic home program - this is not a barrier in any way to you participating in aquatic therapy as part of your current therapy plan! After Discharge from PT, you can continue using home program at  the [facility]/, there is a drop-in fee for $5 ($45 a month)or for 60 years  or older $4.00 ($40 a month for seniors ) or any local [facility] pool.  Memberships for purchase are  available for gym/pool at [facility]  IT IS VERY IMPORTANT THAT YOUR LAST VISIT BE IN THE CLINIC AT [address] AFTER YOUR LAST AQUATIC VISIT.  PLEASE MAKE SURE THAT YOU HAVE A [address]  APPOINTMENT SCHEDULED.   About the pool: Pool is located approximately 500 FT from the entrance of the building.  Please bring a support person if you need assistance traveling this      distance.   Your therapist will assist you in entering the water; there are two ways to           enter: stairs with railings, and a mechanical lift. Your therapist will determine the most appropriate way for you.  Water temperature is usually between 88-90 degrees  There may be up to 2 other swimmers in the pool at the same time  The pool deck is tile, please wear shoes with good traction if you prefer not to be barefoot.    Contact Info:  For appointment scheduling and cancellations:         Please call the [facility]  PH:[phone]              Aquatic Therapy  Outpatient [facility]       All sessions are 45 minutes                                                    

## 2022-02-23 ENCOUNTER — Ambulatory Visit: Payer: Medicare Other

## 2022-02-23 DIAGNOSIS — M5459 Other low back pain: Secondary | ICD-10-CM | POA: Diagnosis not present

## 2022-02-23 DIAGNOSIS — M868X8 Other osteomyelitis, other site: Secondary | ICD-10-CM

## 2022-02-23 DIAGNOSIS — R5381 Other malaise: Secondary | ICD-10-CM

## 2022-02-23 DIAGNOSIS — M791 Myalgia, unspecified site: Secondary | ICD-10-CM

## 2022-02-23 NOTE — Therapy (Signed)
OUTPATIENT PHYSICAL THERAPY TREATMENT NOTE   Patient Name: Haley Singh MRN: [id number] DOB:Aug 29, 1950, 71 y.o., female Today's Date: 02/23/2022  PCP: Katherina Mires, MD REFERRING PROVIDER: Gertie Gowda, DO  END OF SESSION:   PT End of Session - 02/23/22 1409     Visit Number 2    Number of Visits 12    Date for PT Re-Evaluation 04/17/22    Authorization Type [employer]    PT Start Time 1410    PT Stop Time 2119    PT Time Calculation (min) 45 min    Activity Tolerance Patient tolerated treatment well    Behavior During Therapy WFL for tasks assessed/performed             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   Hyperlipidemia    Hypertension    followed by pcp  (11-10-2019 pt stated never had a stress test)   Insomnia    MDD (major depressive disorder)    Mild persistent asthma    followed by pcp and dr Bernita Raisin (pulmonologist)   RLS (restless legs syndrome)    Vertebral osteomyelitis ([place]) 02/21/2021   Past Surgical History:  Procedure Laterality Date   BUBBLE STUDY  01/25/2021   Procedure: BUBBLE STUDY;  Surgeon: Buford Dresser, MD;  Location: [place];  Service: Cardiovascular;;   CATARACT EXTRACTION W/ INTRAOCULAR LENS  IMPLANT, BILATERAL  2009; 2010   CYSTOSCOPY/URETEROSCOPY/HOLMIUM LASER/STENT PLACEMENT Right 08/26/2019   Procedure: CYSTOSCOPY/RETROGRADE/URETEROSCOPY/HOLMIUM LASER/STENT PLACEMENT;  Surgeon: Ceasar Mons, MD;  Location: [facility];  Service: Urology;  Laterality: Right;   CYSTOSCOPY/URETEROSCOPY/HOLMIUM LASER/STENT PLACEMENT Left 11/17/2019   Procedure: CYSTOSCOPY/URETEROSCOPY, RETROGRADE,LASER LITHOTRIPSY, STENT PLACEMENT;  Surgeon: Ceasar Mons, MD;  Location: [facility];  Service: Urology;  Laterality: Left;   CYSTOSCOPY/URETEROSCOPY/HOLMIUM LASER/STENT PLACEMENT Left 02/19/2021   Procedure: CYSTOSCOPY/URETEROSCOPY/HOLMIUM LASER/STENT PLACEMENT;  Surgeon: Lucas Mallow, MD;  Location: [facility];  Service: Urology;  Laterality: Left;   EXPLORATORY LAPAROTOMY W/ BOWEL RESECTION  01-15-2004  @[facility]    small bowel resection for stricture/ benzoar   EXTRACORPOREAL SHOCK WAVE LITHOTRIPSY Right 03/27/2018   Procedure: EXTRACORPOREAL SHOCK WAVE LITHOTRIPSY (ESWL);  Surgeon: Ardis Hughs, MD;  Location: [facility];  Service: Urology;  Laterality: Right;   EXTRACORPOREAL SHOCK WAVE LITHOTRIPSY  x2 2008;  2009;  2015   HOLMIUM LASER APPLICATION Left 09/19/4079   Procedure: HOLMIUM LASER APPLICATION;  Surgeon: Ceasar Mons, MD;  Location: [facility];  Service: Urology;  Laterality: Left;   IR FLUORO GUIDED NEEDLE PLC ASPIRATION/INJECTION LOC  02/22/2021   ORIF ANKLE FRACTURE Right 09/ 2008 @[facility]    per pt has retained hardware   TEE WITHOUT CARDIOVERSION N/A 01/25/2021   Procedure: TRANSESOPHAGEAL ECHOCARDIOGRAM (TEE);  Surgeon: Buford Dresser, MD;  Location: [facility];  Service: Cardiovascular;  Laterality: N/A;   TONSILLECTOMY  age 53   Patient Active Problem List   Diagnosis Date Noted   Fatigue due to excessive exertion 01/31/2022   Weight gain 01/31/2022   Penicillin allergy    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   Vertebral osteomyelitis ([place]) 02/21/2021   Intractable nausea and vomiting 02/18/2021   Hypokalemia 02/18/2021   MSSA bacteremia  Discitis of lumbar region    Osteomyelitis ([place]) 01/17/2021   Mild persistent asthma without complication [id number]   OSA (obstructive sleep apnea) 03/03/2014    REFERRING DIAG: [id number] (ICD-10-CM) - Fatigue due to excessive exertion, initial encounter M54.89 (ICD-10-CM) - Midline back pain, unspecified back location,  unspecified chronicity   THERAPY DIAG:  Other low back pain  Myalgia, unspecified site  Physical deconditioning  Other osteomyelitis, other site [facility])  Rationale for Evaluation and Treatment Rehabilitation  PERTINENT HISTORY: Ms. Stachowicz is a 71 y/o female presenting to PM&R clinic as a referral from her PCP Dr. Reed Breech for pain s/p osteomyelitis . She states when they made the referral, she was in pain but that is no longer the case. She has persistent fatigue, which is the most bothersome thing for her. She also has occassional muscle spasms, but these self resolved.    Fatigue: Has been ongoing and debilitating over the past year. She has not gone to PT, had any medications, or other treatments specifically for fatigue. Does endorse family Hx hypothyroidism, but no personal Hx. Has had some mild weight gain with inactivity. She also endorses poor tolerance of hot weather, but denies any fatigue or pain associated with it. She denies any associated weakness, sensory changes, pain, diplopia or pruritis.    Pain: Her osteomyelitis was at L2-3, and she also has a bulging disc at L2. She was taking oxycodone at the time 2-3x daily in acute phase, but has been off of it since January 2023. She has been getting massages every few weeks to stretch her Psoas, which is helping with her posture.    Function/social: Lives in a 3 story town home by [employer]. Used to work part time at [employer], currently unemployed. She used to do woodworking and photography, but does not anymore because heat tends to make her fatigue worse. She used to exercise in her complex pool, but has also stopped doing that due to heat. She is independent of mobility and ADLs.    She is on venlafaxane for anger and it is very effect.  PRECAUTIONS: Other: osteomyelitis  SUBJECTIVE: Patient reports no pain today and that she did her HEP this morning.  PAIN:  Are you having pain? No   OBJECTIVE: (objective measures completed  at initial evaluation unless otherwise dated)   DIAGNOSTIC FINDINGS:  EXAM: MRI LUMBAR SPINE WITHOUT CONTRAST   TECHNIQUE: Multiplanar, multisequence MR imaging of the lumbar spine was performed. No intravenous contrast was administered.   COMPARISON:  MR lumbar 04/09/2021 and 02/20/2021; CT lumbar 01/17/2021; X-ray lumbar 05/23/2011.   FINDINGS: Despite efforts by the technologist and patient, mild motion artifact is present on today's exam and could not be eliminated. This reduces exam sensitivity and specificity.   Segmentation: Conventional anatomy assumed, with the last open disc space designated L5-S1.Concordant with previous imaging.   Alignment: Stable chronic degenerative grade 1 anterolisthesis at L4-5 and mild convex right scoliosis. Mild retrolisthesis at L1-2 is unchanged.   Vertebrae: Again demonstrated are chronic sequela of discitis and osteomyelitis at L2-3 with partial collapse of the L2 and L3 vertebral bodies and irregularity of the intervening disc space. The associated marrow edema has improved, and no progressive bone destruction identified. Stable chronic endplate degenerative changes at L1-2. No new foci of osteomyelitis identified. Stable mild degenerative changes of the sacroiliac joints.   Conus medullaris: Extends to the L1-2 level and appears normal. Stable sacral Tarlov cysts.   Paraspinal and other soft tissues: Improved paraspinal inflammatory  changes at L2-3. No focal paraspinal or epidural fluid collection.   Disc levels:   No significant disc space findings from T9-10 through T12-L1 aside from anterior osteophytes and scattered perineural cysts.   L1-2: Stable chronic degenerative disc disease with loss of disc height, annular disc bulging and endplate osteophytes. There is moderate facet and ligamentous hypertrophy contributing to stable mild spinal stenosis and mild narrowing of the lateral recesses and foramina bilaterally.    L2-3: As above, improving changes of diskitis/osteomyelitis. Stable annular disc bulging, endplate osteophytes, facet and ligamentous hypertrophy contributing to mild-to-moderate spinal stenosis. There is stable lateral recess and foraminal narrowing bilaterally. No epidural fluid collection identified.   L3-4: Stable annular disc bulging, facet and ligamentous hypertrophy contributing to mild spinal stenosis.   L4-5: Chronic loss of disc height with annular disc bulging and advanced facet hypertrophy, accounting for the grade 1 anterolisthesis. Stable mild foraminal narrowing bilaterally.   L5-S1: Stable chronic degenerative disc disease with posterior osteophytes and mild facet hypertrophy. No spinal stenosis or nerve root encroachment.   IMPRESSION: 1. Further improvement in the bone marrow edema and paraspinal inflammatory changes associated with previously demonstrated L2-3 discitis and osteomyelitis. No progressive vertebral body collapse or recurrent fluid collection identified. 2. No new findings. 3. Stable multilevel spondylosis as detailed above.     Electronically Signed   By: Richardean Sale M.D.   On: 07/04/2021 10:51   PATIENT SURVEYS:  FOTO 55(64 predicted)   SCREENING FOR RED FLAGS: negative   COGNITION:           Overall cognitive status: Within functional limits for tasks assessed                          SENSATION: Not tested   MUSCLE LENGTH: Hamstrings: Right 90 deg; Left 90 deg Thomas test: -20d from neutral B   POSTURE:  patient demos a forward flexed posture   PALPATION: Not tested   LUMBAR ROM:    Active  A/PROM  eval  Flexion WNL  Extension -25% from neutral  Right lateral flexion    Left lateral flexion    Right rotation    Left rotation     (Blank rows = not tested)   LOWER EXTREMITY ROM:      Active  Right eval Left eval  Hip flexion -20d -20d  Hip extension      Hip abduction      Hip adduction      Hip internal  rotation      Hip external rotation      Knee flexion      Knee extension      Ankle dorsiflexion      Ankle plantarflexion      Ankle inversion      Ankle eversion       (Blank rows = not tested)   LOWER EXTREMITY MMT:     MMT Right eval Left eval  Hip flexion      Hip extension      Hip abduction      Hip adduction      Hip internal rotation      Hip external rotation      Knee flexion      Knee extension      Ankle dorsiflexion      Ankle plantarflexion      Ankle inversion      Ankle eversion      Core/trunk flexion 3 3   (  Blank rows = not tested)   LUMBAR SPECIAL TESTS:  Straight leg raise test: Negative, Slump test: Negative, Single leg stance test: Negative, FABER test: Positive, and Thomas test: Positive   FUNCTIONAL TESTS:  5 times sit to stand: 16s with arms crossed   GAIT: Distance walked: 44ft x2 Assistive device utilized: None Level of assistance: Complete Independence Comments: antalgic        TODAY'S TREATMENT  [facility] Adult PT Treatment:                                                DATE: 02/23/2022 Aquatic therapy at [address] - therapeutic pool temp 92 degrees Pt enters building ambulating with SPC. Treatment took place in water 3.8 to  4 ft 8 in.feet deep depending upon activity.  Pt entered and exited the pool via stair and handrails independently  Pt pain level 0/10 at initiation of water walking.  Patient entered water for aquatic therapy for first time and was introduced to principles and therapeutic effects of water as they ambulated and acclimated to pool.  Therapeutic Exercise: Walking forward/backwards/side stepping x 2 laps each Marching walk x2 laps with colorful dumbbells Side stepping with shoulder ab/adduction colorful dumbbells x2 laps Runners stretch on bottom step x30" BIL Hamstring stretch on bottom step x30" BIL Figure 4 squat stretch, BIL UE support 2x30" BIL STS from 3rd step from bottom 2x10 At edge  of pool, pt performed LE exercise: Hip abd/add x20 BIL Hip ext/flex with knee straight x 20 BIL Hip Circles CC/CCW x10 each BIL Marching hip flexion to knee extension 2x10 BIL Hamstring curl x20 BIL Squats 2x20 Sitting on bench in water: Bicycle kicks x1' Reverse bicycle kicks x1' Flutter kicks x1' Scissor kicks x1'  Pt requires the buoyancy of water for active assisted exercises with buoyancy supported for strengthening and AROM exercises. Hydrostatic pressure also supports joints by unweighting joint load by at least 50 % in 3-4 feet depth water. 80% in chest to neck deep water. Water will provide assistance with movement using the current and laminar flow while the buoyancy reduces weight bearing. Pt requires the viscosity of the water for resistance with strengthening exercises.  02/20/2022: Eval and HEP     PATIENT EDUCATION:  Education details: Discussed eval findings, rehab rationale and POC and patient is in agreement  Person educated: Patient Education method: Explanation Education comprehension: verbalized understanding and needs further education     HOME EXERCISE PROGRAM: Access Code: [email or web address] URL: [email or web address] Date: 02/20/2022 Prepared by: Sharlynn Oliphant   Exercises - Hip Flexor Stretch at [facility] of Bed  - 2 x daily - 5 x weekly - 1 sets - 3 reps - 30s hold - Supine Posterior Pelvic Tilt  - 2 x daily - 5 x weekly - 1 sets - 15 reps - Supine Bridge  - 2 x daily - 5 x weekly - 1 sets - 15 reps   ASSESSMENT:   CLINICAL IMPRESSION: Patient presents to first aquatic PT session with no current pain, forward flexed trunk posture, and reports HEP compliance. Patient stated at beginning of session that 86 therapy pool would not increase her fatigue, stating that it is mostly outdoor heat that fatigues her quickly. Session today focused on BIL LE and core strengthening, stretching for hip flexors, and general conditioning in the aquatic environment for  use of buoyancy to offload joints and the viscosity of water as resistance during therapeutic exercise. Patient was able to tolerate all prescribed exercises in the aquatic environment with no adverse effects. Patient continues to benefit from skilled PT services on land and aquatic based and should be progressed as able to improve functional independence.     OBJECTIVE IMPAIRMENTS Abnormal gait, decreased activity tolerance, decreased balance, decreased endurance, decreased knowledge of condition, decreased mobility, difficulty walking, decreased ROM, increased muscle spasms, improper body mechanics, postural dysfunction, and pain.    ACTIVITY LIMITATIONS carrying, lifting, standing, and bed mobility   PERSONAL FACTORS Age, Past/current experiences, Time since onset of injury/illness/exacerbation, and 1 comorbidity: osteomyelitis  are also affecting patient's functional outcome.    REHAB POTENTIAL: Good   CLINICAL DECISION MAKING: Evolving/moderate complexity   EVALUATION COMPLEXITY: Moderate     GOALS: Goals reviewed with patient? Yes   SHORT TERM GOALS: Target date: 03/13/2022   Patient to demonstrate independence in HEP  Baseline:[id number] Goal status: INITIAL   2.  Increase B hip extension to -10d from neutral Baseline: -20d from neutral Goal status: INITIAL   3.  Patient to perform 10 curl ups Baseline: 1 performed at eval Goal status: INITIAL       LONG TERM GOALS: Target date: 04/03/2022   0d hip extension  Baseline: -20d from neutral Goal status: INITIAL   2.  Decrease 5x STS time to <15s Baseline: 16s arms crossed Goal status: INITIAL   3.  Increase core strength to 3+/5 Baseline: 3/5 as evidenced by inability to perform curl up Goal status: INITIAL   4.  Increase FOTO score to 64 Baseline: 55 Goal status: INITIAL         PLAN: PT FREQUENCY: 2x/week   PT DURATION: 6 weeks   PLANNED INTERVENTIONS: Therapeutic exercises, Therapeutic activity,  Neuromuscular re-education, Balance training, Gait training, Patient/Family education, Self Care, Joint mobilization, Stair training, DME instructions, Aquatic Therapy, Dry Needling, Spinal mobilization, Manual therapy, and Re-evaluation.   PLAN FOR NEXT SESSION: HEP review and update, core strength, hip stretch, body mechanics training    Margarette Canada, PTA 02/23/2022, 2:59 PM

## 2022-02-27 ENCOUNTER — Ambulatory Visit: Payer: Medicare Other

## 2022-02-27 DIAGNOSIS — M791 Myalgia, unspecified site: Secondary | ICD-10-CM

## 2022-02-27 DIAGNOSIS — R5381 Other malaise: Secondary | ICD-10-CM

## 2022-02-27 DIAGNOSIS — M5459 Other low back pain: Secondary | ICD-10-CM | POA: Diagnosis not present

## 2022-02-27 NOTE — Therapy (Signed)
OUTPATIENT PHYSICAL THERAPY TREATMENT NOTE   Patient Name: Haley Singh MRN: [id number] DOB:1951/01/20, 71 y.o., female Today's Date: 02/27/2022  PCP: Haley Mires, MD REFERRING PROVIDER: Gertie Gowda, DO  END OF SESSION:   PT End of Session - 02/27/22 1400     Visit Number 3    Number of Visits 12    Date for PT Re-Evaluation 04/17/22    Authorization Type [employer]    PT Start Time 1400    PT Stop Time 1440    PT Time Calculation (min) 40 min    Activity Tolerance Patient tolerated treatment well    Behavior During Therapy WFL for tasks assessed/performed             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   Hyperlipidemia    Hypertension    followed by pcp  (11-10-2019 pt stated never had a stress test)   Insomnia    MDD (major depressive disorder)    Mild persistent asthma    followed by pcp and dr Haley Singh (pulmonologist)   RLS (restless legs syndrome)    Vertebral osteomyelitis ([place]) 02/21/2021   Past Surgical History:  Procedure Laterality Date   BUBBLE STUDY  01/25/2021   Procedure: BUBBLE STUDY;  Surgeon: Haley Dresser, MD;  Location: [facility];  Service: Cardiovascular;;   CATARACT EXTRACTION W/ INTRAOCULAR LENS  IMPLANT, BILATERAL  2009; 2010   CYSTOSCOPY/URETEROSCOPY/HOLMIUM LASER/STENT PLACEMENT Right 08/26/2019   Procedure: CYSTOSCOPY/RETROGRADE/URETEROSCOPY/HOLMIUM LASER/STENT PLACEMENT;  Surgeon: Haley Mons, MD;  Location: [facility];  Service: Urology;  Laterality: Right;   CYSTOSCOPY/URETEROSCOPY/HOLMIUM LASER/STENT PLACEMENT Left 11/17/2019   Procedure: CYSTOSCOPY/URETEROSCOPY, RETROGRADE,LASER LITHOTRIPSY, STENT PLACEMENT;  Surgeon: Haley Mons, MD;  Location: [facility];  Service: Urology;  Laterality: Left;   CYSTOSCOPY/URETEROSCOPY/HOLMIUM LASER/STENT PLACEMENT Left 02/19/2021   Procedure: CYSTOSCOPY/URETEROSCOPY/HOLMIUM LASER/STENT PLACEMENT;  Surgeon: Haley Mallow, MD;  Location: [facility];  Service: Urology;  Laterality: Left;   EXPLORATORY LAPAROTOMY W/ BOWEL RESECTION  01-15-2004  @[facility]    small bowel resection for stricture/ benzoar   EXTRACORPOREAL SHOCK WAVE LITHOTRIPSY Right 03/27/2018   Procedure: EXTRACORPOREAL SHOCK WAVE LITHOTRIPSY (ESWL);  Surgeon: Haley Hughs, MD;  Location: [facility];  Service: Urology;  Laterality: Right;   EXTRACORPOREAL SHOCK WAVE LITHOTRIPSY  x2 2008;  2009;  2015   HOLMIUM LASER APPLICATION Left [id number]   Procedure: HOLMIUM LASER APPLICATION;  Surgeon: Haley Mons, MD;  Location: [facility];  Service: Urology;  Laterality: Left;   IR FLUORO GUIDED NEEDLE PLC ASPIRATION/INJECTION LOC  02/22/2021   ORIF ANKLE FRACTURE Right 09/ 2008 @[facility]    per pt has retained hardware   TEE WITHOUT CARDIOVERSION N/A 01/25/2021   Procedure: TRANSESOPHAGEAL ECHOCARDIOGRAM (TEE);  Surgeon: Haley Dresser, MD;  Location: [facility] ENDOSCOPY;  Service: Cardiovascular;  Laterality: N/A;   TONSILLECTOMY  age 74   Patient Active Problem List   Diagnosis Date Noted   Fatigue due to excessive exertion 01/31/2022   Weight gain 01/31/2022   Penicillin allergy    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   Vertebral osteomyelitis ([place]) 02/21/2021   Intractable nausea and vomiting 02/18/2021   Hypokalemia 02/18/2021   MSSA bacteremia  Discitis of lumbar region    Osteomyelitis ([facility]) 01/17/2021   Mild persistent asthma without complication 05/17/2016   OSA (obstructive sleep apnea) 03/03/2014    REFERRING DIAG: Z61.3XXA (ICD-10-CM) - Fatigue due to excessive exertion, initial encounter M54.89 (ICD-10-CM) - Midline back pain, unspecified back location,  unspecified chronicity   THERAPY DIAG:  Other low back pain  Myalgia, unspecified site  Physical deconditioning  Rationale for Evaluation and Treatment Rehabilitation  PERTINENT HISTORY: Haley Singh is a 71 y/o female presenting to PM&R clinic as a referral from her PCP Dr. Skip Singh for pain s/p osteomyelitis . She states when they made the referral, she was in pain but that is no longer the case. She has persistent fatigue, which is the most bothersome thing for her. She also has occassional muscle spasms, but these self resolved.    Fatigue: Has been ongoing and debilitating over the past year. She has not gone to PT, had any medications, or other treatments specifically for fatigue. Does endorse family Hx hypothyroidism, but no personal Hx. Has had some mild weight gain with inactivity. She also endorses poor tolerance of hot weather, but denies any fatigue or pain associated with it. She denies any associated weakness, sensory changes, pain, diplopia or pruritis.    Pain: Her osteomyelitis was at L2-3, and she also has a bulging disc at L2. She was taking oxycodone at the time 2-3x daily in acute phase, but has been off of it since January 2023. She has been getting massages every few weeks to stretch her Psoas, which is helping with her posture.    Function/social: Lives in a 3 story town home by [employer]. Used to work part time at [employer], currently unemployed. She used to do woodworking and photography, but does not anymore because heat tends to make her fatigue worse. She used to exercise in her complex pool, but has also stopped doing that due to heat. She is independent of mobility and ADLs.    She is on venlafaxane for anger and it is very effect.  PRECAUTIONS: Other: osteomyelitis  SUBJECTIVE: No pain to report, has had one aquatic session and felt it was helpful  PAIN:  Are you having pain? No   OBJECTIVE: (objective measures completed at initial evaluation unless otherwise  dated)   DIAGNOSTIC FINDINGS:  EXAM: MRI LUMBAR SPINE WITHOUT CONTRAST   TECHNIQUE: Multiplanar, multisequence MR imaging of the lumbar spine was performed. No intravenous contrast was administered.   COMPARISON:  MR lumbar 04/09/2021 and 02/20/2021; CT lumbar 01/17/2021; X-ray lumbar 05/23/2011.   FINDINGS: Despite efforts by the technologist and patient, mild motion artifact is present on today's exam and could not be eliminated. This reduces exam sensitivity and specificity.   Segmentation: Conventional anatomy assumed, with the last open disc space designated L5-S1.Concordant with previous imaging.   Alignment: Stable chronic degenerative grade 1 anterolisthesis at L4-5 and mild convex right scoliosis. Mild retrolisthesis at L1-2 is unchanged.   Vertebrae: Again demonstrated are chronic sequela of discitis and osteomyelitis at L2-3 with partial collapse of the L2 and L3 vertebral bodies and irregularity of the intervening disc space. The associated marrow edema has improved, and no progressive bone destruction identified. Stable chronic endplate degenerative changes at L1-2. No new foci of osteomyelitis identified. Stable mild degenerative changes of the sacroiliac joints.   Conus medullaris: Extends to the L1-2 level and appears normal. Stable sacral Tarlov cysts.   Paraspinal and other soft tissues: Improved paraspinal inflammatory changes at L2-3. No focal  paraspinal or epidural fluid collection.   Disc levels:   No significant disc space findings from T9-10 through T12-L1 aside from anterior osteophytes and scattered perineural cysts.   L1-2: Stable chronic degenerative disc disease with loss of disc height, annular disc bulging and endplate osteophytes. There is moderate facet and ligamentous hypertrophy contributing to stable mild spinal stenosis and mild narrowing of the lateral recesses and foramina bilaterally.   L2-3: As above, improving changes of  diskitis/osteomyelitis. Stable annular disc bulging, endplate osteophytes, facet and ligamentous hypertrophy contributing to mild-to-moderate spinal stenosis. There is stable lateral recess and foraminal narrowing bilaterally. No epidural fluid collection identified.   L3-4: Stable annular disc bulging, facet and ligamentous hypertrophy contributing to mild spinal stenosis.   L4-5: Chronic loss of disc height with annular disc bulging and advanced facet hypertrophy, accounting for the grade 1 anterolisthesis. Stable mild foraminal narrowing bilaterally.   L5-S1: Stable chronic degenerative disc disease with posterior osteophytes and mild facet hypertrophy. No spinal stenosis or nerve root encroachment.   IMPRESSION: 1. Further improvement in the bone marrow edema and paraspinal inflammatory changes associated with previously demonstrated L2-3 discitis and osteomyelitis. No progressive vertebral body collapse or recurrent fluid collection identified. 2. No new findings. 3. Stable multilevel spondylosis as detailed above.     Electronically Signed   By: Richardean Sale M.D.   On: 07/04/2021 10:51   PATIENT SURVEYS:  FOTO 55(64 predicted)   SCREENING FOR RED FLAGS: negative   COGNITION:           Overall cognitive status: Within functional limits for tasks assessed                          SENSATION: Not tested   MUSCLE LENGTH: Hamstrings: Right 90 deg; Left 90 deg Thomas test: -20d from neutral B   POSTURE:  patient demos a forward flexed posture   PALPATION: Not tested   LUMBAR ROM:    Active  A/PROM  eval  Flexion WNL  Extension -25% from neutral  Right lateral flexion    Left lateral flexion    Right rotation    Left rotation     (Blank rows = not tested)   LOWER EXTREMITY ROM:      Active  Right eval Left eval  Hip flexion -20d -20d  Hip extension      Hip abduction      Hip adduction      Hip internal rotation      Hip external rotation       Knee flexion      Knee extension      Ankle dorsiflexion      Ankle plantarflexion      Ankle inversion      Ankle eversion       (Blank rows = not tested)   LOWER EXTREMITY MMT:     MMT Right eval Left eval  Hip flexion      Hip extension      Hip abduction      Hip adduction      Hip internal rotation      Hip external rotation      Knee flexion      Knee extension      Ankle dorsiflexion      Ankle plantarflexion      Ankle inversion      Ankle eversion      Core/trunk flexion 3 3   (Blank rows =  not tested)   LUMBAR SPECIAL TESTS:  Straight leg raise test: Negative, Slump test: Negative, Single leg stance test: Negative, FABER test: Positive, and Thomas test: Positive   FUNCTIONAL TESTS:  5 times sit to stand: 16s with arms crossed   GAIT: Distance walked: 71ft x2 Assistive device utilized: None Level of assistance: Complete Independence Comments: antalgic        TODAY'S TREATMENT  [facility] Adult PT Treatment:                                                DATE: 02/27/22 Therapeutic Exercise: Curl ups 15x PPT 3s hold 15x Alternating march in supine w/PPT 15/15 Heel slides with PPT 15/15 Bridge w/ball 15x Bridge against RTB 15x Supine hip fallouts RTB 15x Prone lie 60s followed by PA mobs to L5-1 grade II, 10x each segment Nustep L2 8 min  [facility] Adult PT Treatment:                                                DATE: 02/23/2022 Aquatic therapy at [address] - therapeutic pool temp 92 degrees Pt enters building ambulating with SPC. Treatment took place in water 3.8 to  4 ft 8 in.feet deep depending upon activity.  Pt entered and exited the pool via stair and handrails independently  Pt pain level 0/10 at initiation of water walking.  Patient entered water for aquatic therapy for first time and was introduced to principles and therapeutic effects of water as they ambulated and acclimated to pool.  Therapeutic Exercise: Walking  forward/backwards/side stepping x 2 laps each Marching walk x2 laps with colorful dumbbells Side stepping with shoulder ab/adduction colorful dumbbells x2 laps Runners stretch on bottom step x30" BIL Hamstring stretch on bottom step x30" BIL Figure 4 squat stretch, BIL UE support 2x30" BIL STS from 3rd step from bottom 2x10 At edge of pool, pt performed LE exercise: Hip abd/add x20 BIL Hip ext/flex with knee straight x 20 BIL Hip Circles CC/CCW x10 each BIL Marching hip flexion to knee extension 2x10 BIL Hamstring curl x20 BIL Squats 2x20 Sitting on bench in water: Bicycle kicks x1' Reverse bicycle kicks x1' Flutter kicks x1' Scissor kicks x1'  Pt requires the buoyancy of water for active assisted exercises with buoyancy supported for strengthening and AROM exercises. Hydrostatic pressure also supports joints by unweighting joint load by at least 50 % in 3-4 feet depth water. 80% in chest to neck deep water. Water will provide assistance with movement using the current and laminar flow while the buoyancy reduces weight bearing. Pt requires the viscosity of the water for resistance with strengthening exercises.  02/20/2022: Eval and HEP     PATIENT EDUCATION:  Education details: Discussed eval findings, rehab rationale and POC and patient is in agreement  Person educated: Patient Education method: Explanation Education comprehension: verbalized understanding and needs further education     HOME EXERCISE PROGRAM: Access Code: [email or web address] URL: [email or web address] Date: 02/27/2022 Prepared by: Sharlynn Oliphant  Exercises - Hip Flexor Stretch at [facility] of Bed  - 2 x daily - 5 x weekly - 1 sets - 3 reps - 30s hold - Supine Posterior Pelvic Tilt  - 2 x daily - 5  x weekly - 1 sets - 15 reps - Supine Bridge  - 2 x daily - 5 x weekly - 1 sets - 15 reps - Sidelying Open Book Thoracic Lumbar Rotation and Extension  - 2 x daily - 5 x weekly - 1 sets - 10 reps   ASSESSMENT:    CLINICAL IMPRESSION: Today's session focused on core and abdominal work, hip flexor stretching, functional tasks with core engaged.  Exercises performed in supine, sidelie and prone.  Poor tolerance of prone due to soft tissue restrictions, added lumbar mobs while in prone to maximize benefit of position.     OBJECTIVE IMPAIRMENTS Abnormal gait, decreased activity tolerance, decreased balance, decreased endurance, decreased knowledge of condition, decreased mobility, difficulty walking, decreased ROM, increased muscle spasms, improper body mechanics, postural dysfunction, and pain.    ACTIVITY LIMITATIONS carrying, lifting, standing, and bed mobility   PERSONAL FACTORS Age, Past/current experiences, Time since onset of injury/illness/exacerbation, and 1 comorbidity: osteomyelitis  are also affecting patient's functional outcome.    REHAB POTENTIAL: Good   CLINICAL DECISION MAKING: Evolving/moderate complexity   EVALUATION COMPLEXITY: Moderate     GOALS: Goals reviewed with patient? Yes   SHORT TERM GOALS: Target date: 03/13/2022   Patient to demonstrate independence in HEP  Baseline:[id number] Goal status: INITIAL   2.  Increase B hip extension to -10d from neutral Baseline: -20d from neutral Goal status: INITIAL   3.  Patient to perform 10 curl ups Baseline: 1 performed at eval Goal status: INITIAL       LONG TERM GOALS: Target date: 04/03/2022   0d hip extension  Baseline: -20d from neutral Goal status: INITIAL   2.  Decrease 5x STS time to <15s Baseline: 16s arms crossed Goal status: INITIAL   3.  Increase core strength to 3+/5 Baseline: 3/5 as evidenced by inability to perform curl up Goal status: INITIAL   4.  Increase FOTO score to 64 Baseline: 55 Goal status: INITIAL         PLAN: PT FREQUENCY: 2x/week   PT DURATION: 6 weeks   PLANNED INTERVENTIONS: Therapeutic exercises, Therapeutic activity, Neuromuscular re-education, Balance training, Gait  training, Patient/Family education, Self Care, Joint mobilization, Stair training, DME instructions, Aquatic Therapy, Dry Needling, Spinal mobilization, Manual therapy, and Re-evaluation.   PLAN FOR NEXT SESSION: HEP review and update, core strength, hip stretch, body mechanics training    Lanice Shirts, PT 02/27/2022, 2:40 PM

## 2022-03-02 ENCOUNTER — Ambulatory Visit: Payer: Medicare Other

## 2022-03-02 DIAGNOSIS — M5459 Other low back pain: Secondary | ICD-10-CM

## 2022-03-02 DIAGNOSIS — R5381 Other malaise: Secondary | ICD-10-CM

## 2022-03-02 DIAGNOSIS — M791 Myalgia, unspecified site: Secondary | ICD-10-CM

## 2022-03-02 NOTE — Therapy (Signed)
OUTPATIENT PHYSICAL THERAPY TREATMENT NOTE   Patient Name: Haley Singh MRN: [id number] DOB:1950-06-26, 71 y.o., female Today's Date: 03/02/2022  PCP: Haley Mires, MD REFERRING PROVIDER: Gertie Gowda, DO  END OF SESSION:   PT End of Session - 03/02/22 1128     Visit Number 4    Number of Visits 12    Date for PT Re-Evaluation 04/17/22    Authorization Type [employer]    PT Start Time 1130    PT Stop Time 1215    PT Time Calculation (min) 45 min    Activity Tolerance Patient tolerated treatment well    Behavior During Therapy WFL for tasks assessed/performed              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   Hyperlipidemia    Hypertension    followed by pcp  (11-10-2019 pt stated never had a stress test)   Insomnia    MDD (major depressive disorder)    Mild persistent asthma    followed by pcp and dr Haley Singh (pulmonologist)   RLS (restless legs syndrome)    Vertebral osteomyelitis (Haley Singh) 02/21/2021   Past Surgical History:  Procedure Laterality Date   BUBBLE STUDY  01/25/2021   Procedure: BUBBLE STUDY;  Surgeon: Haley Dresser, MD;  Location: [facility];  Service: Cardiovascular;;   CATARACT EXTRACTION W/ INTRAOCULAR LENS  IMPLANT, BILATERAL  2009; 2010   CYSTOSCOPY/URETEROSCOPY/HOLMIUM LASER/STENT PLACEMENT Right 08/26/2019   Procedure: CYSTOSCOPY/RETROGRADE/URETEROSCOPY/HOLMIUM LASER/STENT PLACEMENT;  Surgeon: Haley Mons, MD;  Location: [facility];  Service: Urology;  Laterality: Right;   CYSTOSCOPY/URETEROSCOPY/HOLMIUM LASER/STENT PLACEMENT Left 11/17/2019   Procedure: CYSTOSCOPY/URETEROSCOPY, RETROGRADE,LASER LITHOTRIPSY, STENT PLACEMENT;  Surgeon: Haley Mons, MD;  Location: [facility];  Service: Urology;  Laterality: Left;   CYSTOSCOPY/URETEROSCOPY/HOLMIUM LASER/STENT PLACEMENT Left 02/19/2021   Procedure: CYSTOSCOPY/URETEROSCOPY/HOLMIUM LASER/STENT PLACEMENT;  Surgeon: Haley Mallow, MD;  Location: [facility];  Service: Urology;  Laterality: Left;   EXPLORATORY LAPAROTOMY W/ BOWEL RESECTION  01-15-2004  @[facility]    small bowel resection for stricture/ benzoar   EXTRACORPOREAL SHOCK WAVE LITHOTRIPSY Right 03/27/2018   Procedure: EXTRACORPOREAL SHOCK WAVE LITHOTRIPSY (ESWL);  Surgeon: Haley Hughs, MD;  Location: [facility];  Service: Urology;  Laterality: Right;   EXTRACORPOREAL SHOCK WAVE LITHOTRIPSY  x2 2008;  2009;  2015   HOLMIUM LASER APPLICATION Left [id number]   Procedure: HOLMIUM LASER APPLICATION;  Surgeon: Haley Mons, MD;  Location: [facility];  Service: Urology;  Laterality: Left;   IR FLUORO GUIDED NEEDLE PLC ASPIRATION/INJECTION LOC  02/22/2021   ORIF ANKLE FRACTURE Right 09/ 2008 @[facility]    per pt has retained hardware   TEE WITHOUT CARDIOVERSION N/A 01/25/2021   Procedure: TRANSESOPHAGEAL ECHOCARDIOGRAM (TEE);  Surgeon: Haley Dresser, MD;  Location: [facility];  Service: Cardiovascular;  Laterality: N/A;   TONSILLECTOMY  age 8   Patient Active Problem List   Diagnosis Date Noted   Fatigue due to excessive exertion 01/31/2022   Weight gain 01/31/2022   Penicillin allergy    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   Vertebral osteomyelitis ([place]) 02/21/2021   Intractable nausea and vomiting 02/18/2021   Hypokalemia 02/18/2021   MSSA  bacteremia    Discitis of lumbar region    Osteomyelitis ([place]) 01/17/2021   Mild persistent asthma without complication [id number]   OSA (obstructive sleep apnea) 03/03/2014    REFERRING DIAG: [id number] (ICD-10-CM) - Fatigue due to excessive exertion, initial encounter M54.89 (ICD-10-CM) - Midline back pain, unspecified back location,  unspecified chronicity   THERAPY DIAG:  Other low back pain  Myalgia, unspecified site  Physical deconditioning  Rationale for Evaluation and Treatment Rehabilitation  PERTINENT HISTORY: [name] is a 71 y/o female presenting to PM&R clinic as a referral from her PCP Haley Singh for pain s/p osteomyelitis . She states when they made the referral, she was in pain but that is no longer the case. She has persistent fatigue, which is the most bothersome thing for her. She also has occassional muscle spasms, but these self resolved.    Fatigue: Has been ongoing and debilitating over the past year. She has not gone to PT, had any medications, or other treatments specifically for fatigue. Does endorse family Hx hypothyroidism, but no personal Hx. Has had some mild weight gain with inactivity. She also endorses poor tolerance of hot weather, but denies any fatigue or pain associated with it. She denies any associated weakness, sensory changes, pain, diplopia or pruritis.    Pain: Her osteomyelitis was at L2-3, and she also has a bulging disc at L2. She was taking oxycodone at the time 2-3x daily in acute phase, but has been off of it since January 2023. She has been getting massages every few weeks to stretch her Psoas, which is helping with her posture.    Function/social: Lives in a 3 story town home by [employer]. Used to work part time at [employer], currently unemployed. She used to do woodworking and photography, but does not anymore because heat tends to make her fatigue worse. She used to exercise in her complex pool, but has also stopped doing that due to heat. She is independent of mobility and ADLs.    She is on venlafaxane for anger and it is very effect.  PRECAUTIONS: Other: osteomyelitis  SUBJECTIVE: Patient reports some soreness and fatigue after land session, but it only lasted a day and she feels ok today.   PAIN:  Are you having pain? No   OBJECTIVE: (objective measures  completed at initial evaluation unless otherwise dated)   DIAGNOSTIC FINDINGS:  EXAM: MRI LUMBAR SPINE WITHOUT CONTRAST   TECHNIQUE: Multiplanar, multisequence MR imaging of the lumbar spine was performed. No intravenous contrast was administered.   COMPARISON:  MR lumbar 04/09/2021 and 02/20/2021; CT lumbar 01/17/2021; X-ray lumbar 05/23/2011.   FINDINGS: Despite efforts by the technologist and patient, mild motion artifact is present on today's exam and could not be eliminated. This reduces exam sensitivity and specificity.   Segmentation: Conventional anatomy assumed, with the last open disc space designated L5-S1.Concordant with previous imaging.   Alignment: Stable chronic degenerative grade 1 anterolisthesis at L4-5 and mild convex right scoliosis. Mild retrolisthesis at L1-2 is unchanged.   Vertebrae: Again demonstrated are chronic sequela of discitis and osteomyelitis at L2-3 with partial collapse of the L2 and L3 vertebral bodies and irregularity of the intervening disc space. The associated marrow edema has improved, and no progressive bone destruction identified. Stable chronic endplate degenerative changes at L1-2. No new foci of osteomyelitis identified. Stable mild degenerative changes of the sacroiliac joints.   Conus medullaris: Extends to the L1-2 level and appears normal. Stable sacral Tarlov cysts.   Paraspinal and  other soft tissues: Improved paraspinal inflammatory changes at L2-3. No focal paraspinal or epidural fluid collection.   Disc levels:   No significant disc space findings from T9-10 through T12-L1 aside from anterior osteophytes and scattered perineural cysts.   L1-2: Stable chronic degenerative disc disease with loss of disc height, annular disc bulging and endplate osteophytes. There is moderate facet and ligamentous hypertrophy contributing to stable mild spinal stenosis and mild narrowing of the lateral recesses and foramina  bilaterally.   L2-3: As above, improving changes of diskitis/osteomyelitis. Stable annular disc bulging, endplate osteophytes, facet and ligamentous hypertrophy contributing to mild-to-moderate spinal stenosis. There is stable lateral recess and foraminal narrowing bilaterally. No epidural fluid collection identified.   L3-4: Stable annular disc bulging, facet and ligamentous hypertrophy contributing to mild spinal stenosis.   L4-5: Chronic loss of disc height with annular disc bulging and advanced facet hypertrophy, accounting for the grade 1 anterolisthesis. Stable mild foraminal narrowing bilaterally.   L5-S1: Stable chronic degenerative disc disease with posterior osteophytes and mild facet hypertrophy. No spinal stenosis or nerve root encroachment.   IMPRESSION: 1. Further improvement in the bone marrow edema and paraspinal inflammatory changes associated with previously demonstrated L2-3 discitis and osteomyelitis. No progressive vertebral body collapse or recurrent fluid collection identified. 2. No new findings. 3. Stable multilevel spondylosis as detailed above.     Electronically Signed   By: Richardean Sale M.D.   On: 07/04/2021 10:51   PATIENT SURVEYS:  FOTO 55(64 predicted)   SCREENING FOR RED FLAGS: negative   COGNITION:           Overall cognitive status: Within functional limits for tasks assessed                          SENSATION: Not tested   MUSCLE LENGTH: Hamstrings: Right 90 deg; Left 90 deg Thomas test: -20d from neutral B   POSTURE:  patient demos a forward flexed posture   PALPATION: Not tested   LUMBAR ROM:    Active  A/PROM  eval  Flexion WNL  Extension -25% from neutral  Right lateral flexion    Left lateral flexion    Right rotation    Left rotation     (Blank rows = not tested)   LOWER EXTREMITY ROM:      Active  Right eval Left eval  Hip flexion -20d -20d  Hip extension      Hip abduction      Hip adduction       Hip internal rotation      Hip external rotation      Knee flexion      Knee extension      Ankle dorsiflexion      Ankle plantarflexion      Ankle inversion      Ankle eversion       (Blank rows = not tested)   LOWER EXTREMITY MMT:     MMT Right eval Left eval  Hip flexion      Hip extension      Hip abduction      Hip adduction      Hip internal rotation      Hip external rotation      Knee flexion      Knee extension      Ankle dorsiflexion      Ankle plantarflexion      Ankle inversion      Ankle eversion  Core/trunk flexion 3 3   (Blank rows = not tested)   LUMBAR SPECIAL TESTS:  Straight leg raise test: Negative, Slump test: Negative, Single leg stance test: Negative, FABER test: Positive, and Thomas test: Positive   FUNCTIONAL TESTS:  5 times sit to stand: 16s with arms crossed   GAIT: Distance walked: 2ft x2 Assistive device utilized: None Level of assistance: Complete Independence Comments: antalgic        TODAY'S TREATMENT  [facility] Adult PT Treatment:                                                DATE: 03/02/2022 Aquatic therapy at [address] - therapeutic pool temp 92 degrees Pt enters building ambulating with SPC. Treatment took place in water 3.8 to  4 ft 8 in.feet deep depending upon activity.  Pt entered and exited the pool via stair and handrails independently  Pt pain level 0/10 at initiation of water walking.  Therapeutic Exercise: Walking forward/backwards/side stepping x 2 laps each Marching walk x2 laps with yellow dumbbells Side stepping with shoulder ab/adduction colorful dumbbells x2 laps Runners stretch on bottom step x30" BIL Hamstring stretch on bottom step x30" BIL Figure 4 squat stretch, BIL UE support x30" BIL Warrior I with blue square noodle rows x20 BIL At edge of pool, pt performed LE exercise: Hip abd/add x20 BIL Hip ext/flex with knee straight x 20 BIL Hip Circles CC/CCW x10 each BIL Marching hip  flexion to knee extension 2x10 BIL Hamstring curl x20 BIL Squats 2x20 Sitting on bench in water: Bicycle kicks x1' Flutter kicks x1' Scissor kicks x1'  Pt requires the buoyancy of water for active assisted exercises with buoyancy supported for strengthening and AROM exercises. Hydrostatic pressure also supports joints by unweighting joint load by at least 50 % in 3-4 feet depth water. 80% in chest to neck deep water. Water will provide assistance with movement using the current and laminar flow while the buoyancy reduces weight bearing. Pt requires the viscosity of the water for resistance with strengthening exercises.   [facility] Adult PT Treatment:                                                DATE: 02/27/22 Therapeutic Exercise: Curl ups 15x PPT 3s hold 15x Alternating march in supine w/PPT 15/15 Heel slides with PPT 15/15 Bridge w/ball 15x Bridge against RTB 15x Supine hip fallouts RTB 15x Prone lie 60s followed by PA mobs to L5-1 grade II, 10x each segment Nustep L2 8 min  [facility] Adult PT Treatment:                                                DATE: 02/23/2022 Aquatic therapy at [address] - therapeutic pool temp 92 degrees Pt enters building ambulating with SPC. Treatment took place in water 3.8 to  4 ft 8 in.feet deep depending upon activity.  Pt entered and exited the pool via stair and handrails independently  Pt pain level 0/10 at initiation of water walking.  Patient entered water for aquatic therapy  for first time and was introduced to principles and therapeutic effects of water as they ambulated and acclimated to pool.  Therapeutic Exercise: Walking forward/backwards/side stepping x 2 laps each Marching walk x2 laps with colorful dumbbells Side stepping with shoulder ab/adduction colorful dumbbells x2 laps Runners stretch on bottom step x30" BIL Hamstring stretch on bottom step x30" BIL Figure 4 squat stretch, BIL UE support 2x30" BIL STS from 3rd step  from bottom 2x10 At edge of pool, pt performed LE exercise: Hip abd/add x20 BIL Hip ext/flex with knee straight x 20 BIL Hip Circles CC/CCW x10 each BIL Marching hip flexion to knee extension 2x10 BIL Hamstring curl x20 BIL Squats 2x20 Sitting on bench in water: Bicycle kicks x1' Reverse bicycle kicks x1' Flutter kicks x1' Scissor kicks x1'  Pt requires the buoyancy of water for active assisted exercises with buoyancy supported for strengthening and AROM exercises. Hydrostatic pressure also supports joints by unweighting joint load by at least 50 % in 3-4 feet depth water. 80% in chest to neck deep water. Water will provide assistance with movement using the current and laminar flow while the buoyancy reduces weight bearing. Pt requires the viscosity of the water for resistance with strengthening exercises.    PATIENT EDUCATION:  Education details: Discussed eval findings, rehab rationale and POC and patient is in agreement  Person educated: Patient Education method: Explanation Education comprehension: verbalized understanding and needs further education     HOME EXERCISE PROGRAM: Access Code: [email or web address] URL: [email or web address] Date: 02/27/2022 Prepared by: Sharlynn Oliphant  Exercises - Hip Flexor Stretch at [facility] of Bed  - 2 x daily - 5 x weekly - 1 sets - 3 reps - 30s hold - Supine Posterior Pelvic Tilt  - 2 x daily - 5 x weekly - 1 sets - 15 reps - Supine Bridge  - 2 x daily - 5 x weekly - 1 sets - 15 reps - Sidelying Open Book Thoracic Lumbar Rotation and Extension  - 2 x daily - 5 x weekly - 1 sets - 10 reps   ASSESSMENT:   CLINICAL IMPRESSION: Patient presents to aquatic PT session with no current pain and reports mild soreness on the L side of her lower back after land session. Session today focused on BIL LE strengthening, general conditioning, and hip flexor stretching in the aquatic environment for use of buoyancy to offload joints and the viscosity of  water as resistance during therapeutic exercise. Patient was able to tolerate all prescribed exercises in the aquatic environment with no adverse effects. Patient continues to benefit from skilled PT services on land and aquatic based and should be progressed as able to improve functional independence.    OBJECTIVE IMPAIRMENTS Abnormal gait, decreased activity tolerance, decreased balance, decreased endurance, decreased knowledge of condition, decreased mobility, difficulty walking, decreased ROM, increased muscle spasms, improper body mechanics, postural dysfunction, and pain.    ACTIVITY LIMITATIONS carrying, lifting, standing, and bed mobility   PERSONAL FACTORS Age, Past/current experiences, Time since onset of injury/illness/exacerbation, and 1 comorbidity: osteomyelitis  are also affecting patient's functional outcome.    REHAB POTENTIAL: Good   CLINICAL DECISION MAKING: Evolving/moderate complexity   EVALUATION COMPLEXITY: Moderate     GOALS: Goals reviewed with patient? Yes   SHORT TERM GOALS: Target date: 03/13/2022   Patient to demonstrate independence in HEP  Baseline:[id number] Goal status: INITIAL   2.  Increase B hip extension to -10d from neutral Baseline: -20d from neutral Goal status: INITIAL   3.  Patient  to perform 10 curl ups Baseline: 1 performed at eval Goal status: INITIAL       LONG TERM GOALS: Target date: 04/03/2022   0d hip extension  Baseline: -20d from neutral Goal status: INITIAL   2.  Decrease 5x STS time to <15s Baseline: 16s arms crossed Goal status: INITIAL   3.  Increase core strength to 3+/5 Baseline: 3/5 as evidenced by inability to perform curl up Goal status: INITIAL   4.  Increase FOTO score to 64 Baseline: 55 Goal status: INITIAL         PLAN: PT FREQUENCY: 2x/week   PT DURATION: 6 weeks   PLANNED INTERVENTIONS: Therapeutic exercises, Therapeutic activity, Neuromuscular re-education, Balance training, Gait training,  Patient/Family education, Self Care, Joint mobilization, Stair training, DME instructions, Aquatic Therapy, Dry Needling, Spinal mobilization, Manual therapy, and Re-evaluation.   PLAN FOR NEXT SESSION: HEP review and update, core strength, hip stretch, body mechanics training    Margarette Canada, PTA 03/02/2022, 11:31 AM

## 2022-03-06 ENCOUNTER — Ambulatory Visit: Payer: Medicare Other | Attending: Physical Medicine and Rehabilitation

## 2022-03-06 DIAGNOSIS — M5459 Other low back pain: Secondary | ICD-10-CM | POA: Insufficient documentation

## 2022-03-06 DIAGNOSIS — M868X8 Other osteomyelitis, other site: Secondary | ICD-10-CM | POA: Insufficient documentation

## 2022-03-06 DIAGNOSIS — R5381 Other malaise: Secondary | ICD-10-CM | POA: Insufficient documentation

## 2022-03-06 DIAGNOSIS — M791 Myalgia, unspecified site: Secondary | ICD-10-CM | POA: Insufficient documentation

## 2022-03-06 NOTE — Therapy (Signed)
OUTPATIENT PHYSICAL THERAPY TREATMENT NOTE   Patient Name: Haley Singh MRN: [id number] DOB:01-03-1951, 71 y.o., female Today's Date: 03/06/2022  PCP: Haley Mires, MD REFERRING PROVIDER: Gertie Gowda, DO  END OF SESSION:   PT End of Session - 03/06/22 1402     Visit Number 5    Number of Visits 12    Date for PT Re-Evaluation 04/17/22    Authorization Type [employer]    PT Start Time 1400    PT Stop Time 1440    PT Time Calculation (min) 40 min    Activity Tolerance Patient tolerated treatment well    Behavior During Therapy WFL for tasks assessed/performed              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   Hyperlipidemia    Hypertension    followed by pcp  (11-10-2019 pt stated never had a stress test)   Insomnia    MDD (major depressive disorder)    Mild persistent asthma    followed by pcp and dr Haley Singh (pulmonologist)   RLS (restless legs syndrome)    Vertebral osteomyelitis ([place]) 02/21/2021   Past Surgical History:  Procedure Laterality Date   BUBBLE STUDY  01/25/2021   Procedure: BUBBLE STUDY;  Surgeon: Haley Dresser, MD;  Location: [place];  Service: Cardiovascular;;   CATARACT EXTRACTION W/ INTRAOCULAR LENS  IMPLANT, BILATERAL  2009; 2010   CYSTOSCOPY/URETEROSCOPY/HOLMIUM LASER/STENT PLACEMENT Right 08/26/2019   Procedure: CYSTOSCOPY/RETROGRADE/URETEROSCOPY/HOLMIUM LASER/STENT PLACEMENT;  Surgeon: Haley Mons, MD;  Location: [facility];  Service: Urology;  Laterality: Right;   CYSTOSCOPY/URETEROSCOPY/HOLMIUM LASER/STENT PLACEMENT Left 11/17/2019   Procedure: CYSTOSCOPY/URETEROSCOPY, RETROGRADE,LASER LITHOTRIPSY, STENT PLACEMENT;  Surgeon: Haley Mons, MD;  Location: [facility];  Service: Urology;  Laterality: Left;   CYSTOSCOPY/URETEROSCOPY/HOLMIUM LASER/STENT PLACEMENT Left 02/19/2021   Procedure: CYSTOSCOPY/URETEROSCOPY/HOLMIUM LASER/STENT PLACEMENT;  Surgeon: Haley Mallow, MD;  Location: [facility];  Service: Urology;  Laterality: Left;   EXPLORATORY LAPAROTOMY W/ BOWEL RESECTION  01-15-2004  @[facility]    small bowel resection for stricture/ benzoar   EXTRACORPOREAL SHOCK WAVE LITHOTRIPSY Right 03/27/2018   Procedure: EXTRACORPOREAL SHOCK WAVE LITHOTRIPSY (ESWL);  Surgeon: Haley Hughs, MD;  Location: [facility];  Service: Urology;  Laterality: Right;   EXTRACORPOREAL SHOCK WAVE LITHOTRIPSY  x2 2008;  2009;  2015   HOLMIUM LASER APPLICATION Left 01/26/538   Procedure: HOLMIUM LASER APPLICATION;  Surgeon: Haley Mons, MD;  Location[facility];  Service: Urology;  Laterality: Left;   IR FLUORO GUIDED NEEDLE PLC ASPIRATION/INJECTION LOC  02/22/2021   ORIF ANKLE FRACTURE Right 09/ 2008 @[facility]    per pt has retained hardware   TEE WITHOUT CARDIOVERSION N/A 01/25/2021   Procedure: TRANSESOPHAGEAL ECHOCARDIOGRAM (TEE);  Surgeon: Haley Dresser, MD;  Location: [facility];  Service: Cardiovascular;  Laterality: N/A;   TONSILLECTOMY  age 46   Patient Active Problem List   Diagnosis Date Noted   Fatigue due to excessive exertion 01/31/2022   Weight gain 01/31/2022   Penicillin allergy    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   Vertebral osteomyelitis ([place]) 02/21/2021   Intractable nausea and vomiting 02/18/2021   Hypokalemia 02/18/2021   MSSA  bacteremia    Discitis of lumbar region    Osteomyelitis ([place]) 01/17/2021   Mild persistent asthma without complication [id number]   OSA (obstructive sleep apnea) 03/03/2014    REFERRING DIAG: [id number] (ICD-10-CM) - Fatigue due to excessive exertion, initial encounter M54.89 (ICD-10-CM) - Midline back pain, unspecified back location,  unspecified chronicity   THERAPY DIAG:  Other low back pain  Myalgia, unspecified site  Physical deconditioning  Rationale for Evaluation and Treatment Rehabilitation  PERTINENT HISTORY: Haley Singh is a 71 y/o female presenting to PM&R clinic as a referral from her PCP Dr. Reed Singh for pain s/p osteomyelitis . She states when they made the referral, she was in pain but that is no longer the case. She has persistent fatigue, which is the most bothersome thing for her. She also has occassional muscle spasms, but these self resolved.    Fatigue: Has been ongoing and debilitating over the past year. She has not gone to PT, had any medications, or other treatments specifically for fatigue. Does endorse family Hx hypothyroidism, but no personal Hx. Has had some mild weight gain with inactivity. She also endorses poor tolerance of hot weather, but denies any fatigue or pain associated with it. She denies any associated weakness, sensory changes, pain, diplopia or pruritis.    Pain: Her osteomyelitis was at L2-3, and she also has a bulging disc at L2. She was taking oxycodone at the time 2-3x daily in acute phase, but has been off of it since January 2023. She has been getting massages every few weeks to stretch her Psoas, which is helping with her posture.    Function/social: Lives in a 3 story town home by [employer]. Used to work part time at [employer], currently unemployed. She used to do woodworking and photography, but does not anymore because heat tends to make her fatigue worse. She used to exercise in her complex pool, but has also stopped doing that due to heat. She is independent of mobility and ADLs.    She is on venlafaxane for anger and it is very effect.  PRECAUTIONS: Other: osteomyelitis  SUBJECTIVE: Feels she is walking taller and has been told she is walking straighter by others.    PAIN:  Are you having pain? No   OBJECTIVE: (objective measures completed at initial evaluation  unless otherwise dated)   DIAGNOSTIC FINDINGS:  EXAM: MRI LUMBAR SPINE WITHOUT CONTRAST   TECHNIQUE: Multiplanar, multisequence MR imaging of the lumbar spine was performed. No intravenous contrast was administered.   COMPARISON:  MR lumbar 04/09/2021 and 02/20/2021; CT lumbar 01/17/2021; X-ray lumbar 05/23/2011.   FINDINGS: Despite efforts by the technologist and patient, mild motion artifact is present on today's exam and could not be eliminated. This reduces exam sensitivity and specificity.   Segmentation: Conventional anatomy assumed, with the last open disc space designated L5-S1.Concordant with previous imaging.   Alignment: Stable chronic degenerative grade 1 anterolisthesis at L4-5 and mild convex right scoliosis. Mild retrolisthesis at L1-2 is unchanged.   Vertebrae: Again demonstrated are chronic sequela of discitis and osteomyelitis at L2-3 with partial collapse of the L2 and L3 vertebral bodies and irregularity of the intervening disc space. The associated marrow edema has improved, and no progressive bone destruction identified. Stable chronic endplate degenerative changes at L1-2. No new foci of osteomyelitis identified. Stable mild degenerative changes of the sacroiliac joints.   Conus medullaris: Extends to the L1-2 level and appears normal. Stable sacral Tarlov cysts.   Paraspinal and other soft tissues: Improved  paraspinal inflammatory changes at L2-3. No focal paraspinal or epidural fluid collection.   Disc levels:   No significant disc space findings from T9-10 through T12-L1 aside from anterior osteophytes and scattered perineural cysts.   L1-2: Stable chronic degenerative disc disease with loss of disc height, annular disc bulging and endplate osteophytes. There is moderate facet and ligamentous hypertrophy contributing to stable mild spinal stenosis and mild narrowing of the lateral recesses and foramina bilaterally.   L2-3: As above,  improving changes of diskitis/osteomyelitis. Stable annular disc bulging, endplate osteophytes, facet and ligamentous hypertrophy contributing to mild-to-moderate spinal stenosis. There is stable lateral recess and foraminal narrowing bilaterally. No epidural fluid collection identified.   L3-4: Stable annular disc bulging, facet and ligamentous hypertrophy contributing to mild spinal stenosis.   L4-5: Chronic loss of disc height with annular disc bulging and advanced facet hypertrophy, accounting for the grade 1 anterolisthesis. Stable mild foraminal narrowing bilaterally.   L5-S1: Stable chronic degenerative disc disease with posterior osteophytes and mild facet hypertrophy. No spinal stenosis or nerve root encroachment.   IMPRESSION: 1. Further improvement in the bone marrow edema and paraspinal inflammatory changes associated with previously demonstrated L2-3 discitis and osteomyelitis. No progressive vertebral body collapse or recurrent fluid collection identified. 2. No new findings. 3. Stable multilevel spondylosis as detailed above.     Electronically Signed   By: Richardean Sale M.D.   On: 07/04/2021 10:51   PATIENT SURVEYS:  FOTO 55(64 predicted)   SCREENING FOR RED FLAGS: negative   COGNITION:           Overall cognitive status: Within functional limits for tasks assessed                          SENSATION: Not tested   MUSCLE LENGTH: Hamstrings: Right 90 deg; Left 90 deg Thomas test: -20d from neutral B   POSTURE:  patient demos a forward flexed posture   PALPATION: Not tested   LUMBAR ROM:    Active  A/PROM  eval  Flexion WNL  Extension -25% from neutral  Right lateral flexion    Left lateral flexion    Right rotation    Left rotation     (Blank rows = not tested)   LOWER EXTREMITY ROM:      Active  Right eval Left eval  Hip flexion -20d -20d  Hip extension      Hip abduction      Hip adduction      Hip internal rotation      Hip  external rotation      Knee flexion      Knee extension      Ankle dorsiflexion      Ankle plantarflexion      Ankle inversion      Ankle eversion       (Blank rows = not tested)   LOWER EXTREMITY MMT:     MMT Right eval Left eval  Hip flexion      Hip extension      Hip abduction      Hip adduction      Hip internal rotation      Hip external rotation      Knee flexion      Knee extension      Ankle dorsiflexion      Ankle plantarflexion      Ankle inversion      Ankle eversion      Core/trunk flexion  3 3   (Blank rows = not tested)   LUMBAR SPECIAL TESTS:  Straight leg raise test: Negative, Slump test: Negative, Single leg stance test: Negative, FABER test: Positive, and Thomas test: Positive   FUNCTIONAL TESTS:  5 times sit to stand: 16s with arms crossed   GAIT: Distance walked: 35ft x2 Assistive device utilized: None Level of assistance: Complete Independence Comments: antalgic        TODAY'S TREATMENT  [facility] Adult PT Treatment:                                                DATE: 03/06/22 Therapeutic Exercise: Curl ups 15x PPT 3s hold 15x Heel slides with PPT 15/15 Bridge w/ball 15x Bridge against RTB 15x Supine hip fallouts RTB 15x Prone lie 12s followed by PA mobs to L5-1 grade II, 10x each segment Nustep L3 8 min SLR w/PPT 15/15 1/2 roll  Standing at wall  opposite shoulder flexion/hip extension 5/5  [facility] Adult PT Treatment:                                                DATE: 03/02/2022 Aquatic therapy at [facility]- [address] - therapeutic pool temp 92 degrees Pt enters building ambulating with SPC. Treatment took place in water 3.8 to  4 ft 8 in.feet deep depending upon activity.  Pt entered and exited the pool via stair and handrails independently  Pt pain level 0/10 at initiation of water walking.  Therapeutic Exercise: Walking forward/backwards/side stepping x 2 laps each Marching walk x2 laps with yellow dumbbells Side stepping  with shoulder ab/adduction colorful dumbbells x2 laps Runners stretch on bottom step x30" BIL Hamstring stretch on bottom step x30" BIL Figure 4 squat stretch, BIL UE support x30" BIL Warrior I with blue square noodle rows x20 BIL At edge of pool, pt performed LE exercise: Hip abd/add x20 BIL Hip ext/flex with knee straight x 20 BIL Hip Circles CC/CCW x10 each BIL Marching hip flexion to knee extension 2x10 BIL Hamstring curl x20 BIL Squats 2x20 Sitting on bench in water: Bicycle kicks x1' Flutter kicks x1' Scissor kicks x1'  Pt requires the buoyancy of water for active assisted exercises with buoyancy supported for strengthening and AROM exercises. Hydrostatic pressure also supports joints by unweighting joint load by at least 50 % in 3-4 feet depth water. 80% in chest to neck deep water. Water will provide assistance with movement using the current and laminar flow while the buoyancy reduces weight bearing. Pt requires the viscosity of the water for resistance with strengthening exercises.   [facility] Adult PT Treatment:                                                DATE: 02/27/22 Therapeutic Exercise: Curl ups 15x PPT 3s hold 15x Alternating march in supine w/PPT 15/15 Heel slides with PPT 15/15 Bridge w/ball 15x Bridge against RTB 15x Supine hip fallouts RTB 15x Prone lie 60s followed by PA mobs to L5-1 grade II, 10x each segment Nustep L2 8 min  [facility] Adult PT Treatment:  DATE: 02/23/2022 Aquatic therapy at [address] - therapeutic pool temp 92 degrees Pt enters building ambulating with SPC. Treatment took place in water 3.8 to  4 ft 8 in.feet deep depending upon activity.  Pt entered and exited the pool via stair and handrails independently  Pt pain level 0/10 at initiation of water walking.  Patient entered water for aquatic therapy for first time and was introduced to principles and therapeutic effects of water as  they ambulated and acclimated to pool.  Therapeutic Exercise: Walking forward/backwards/side stepping x 2 laps each Marching walk x2 laps with colorful dumbbells Side stepping with shoulder ab/adduction colorful dumbbells x2 laps Runners stretch on bottom step x30" BIL Hamstring stretch on bottom step x30" BIL Figure 4 squat stretch, BIL UE support 2x30" BIL STS from 3rd step from bottom 2x10 At edge of pool, pt performed LE exercise: Hip abd/add x20 BIL Hip ext/flex with knee straight x 20 BIL Hip Circles CC/CCW x10 each BIL Marching hip flexion to knee extension 2x10 BIL Hamstring curl x20 BIL Squats 2x20 Sitting on bench in water: Bicycle kicks x1' Reverse bicycle kicks x1' Flutter kicks x1' Scissor kicks x1'  Pt requires the buoyancy of water for active assisted exercises with buoyancy supported for strengthening and AROM exercises. Hydrostatic pressure also supports joints by unweighting joint load by at least 50 % in 3-4 feet depth water. 80% in chest to neck deep water. Water will provide assistance with movement using the current and laminar flow while the buoyancy reduces weight bearing. Pt requires the viscosity of the water for resistance with strengthening exercises.    PATIENT EDUCATION:  Education details: Discussed eval findings, rehab rationale and POC and patient is in agreement  Person educated: Patient Education method: Explanation Education comprehension: verbalized understanding and needs further education     HOME EXERCISE PROGRAM: Access Code: [email or web address] URL: [email or web address] Date: 02/27/2022 Prepared by: Sharlynn Oliphant  Exercises - Hip Flexor Stretch at [facility] of Bed  - 2 x daily - 5 x weekly - 1 sets - 3 reps - 30s hold - Supine Posterior Pelvic Tilt  - 2 x daily - 5 x weekly - 1 sets - 15 reps - Supine Bridge  - 2 x daily - 5 x weekly - 1 sets - 15 reps - Sidelying Open Book Thoracic Lumbar Rotation and Extension  - 2 x daily - 5 x  weekly - 1 sets - 10 reps   ASSESSMENT:   CLINICAL IMPRESSION: Showing improved posture and ability to maintain upright position.  Continued core tasks and functional activities to facilitate hip flexor relaxation.  Added wall activity to activate posterior chain musculature and promote extension.    OBJECTIVE IMPAIRMENTS Abnormal gait, decreased activity tolerance, decreased balance, decreased endurance, decreased knowledge of condition, decreased mobility, difficulty walking, decreased ROM, increased muscle spasms, improper body mechanics, postural dysfunction, and pain.    ACTIVITY LIMITATIONS carrying, lifting, standing, and bed mobility   PERSONAL FACTORS Age, Past/current experiences, Time since onset of injury/illness/exacerbation, and 1 comorbidity: osteomyelitis  are also affecting patient's functional outcome.    REHAB POTENTIAL: Good   CLINICAL DECISION MAKING: Evolving/moderate complexity   EVALUATION COMPLEXITY: Moderate     GOALS: Goals reviewed with patient? Yes   SHORT TERM GOALS: Target date: 03/13/2022   Patient to demonstrate independence in HEP  Baseline:[id number] Goal status: INITIAL   2.  Increase B hip extension to -10d from neutral Baseline: -20d from neutral Goal status: INITIAL   3.  Patient to perform 10 curl ups Baseline: 1 performed at eval Goal status: INITIAL       LONG TERM GOALS: Target date: 04/03/2022   0d hip extension  Baseline: -20d from neutral Goal status: INITIAL   2.  Decrease 5x STS time to <15s Baseline: 16s arms crossed Goal status: INITIAL   3.  Increase core strength to 3+/5 Baseline: 3/5 as evidenced by inability to perform curl up Goal status: INITIAL   4.  Increase FOTO score to 64 Baseline: 55 Goal status: INITIAL         PLAN: PT FREQUENCY: 2x/week   PT DURATION: 6 weeks   PLANNED INTERVENTIONS: Therapeutic exercises, Therapeutic activity, Neuromuscular re-education, Balance training, Gait training,  Patient/Family education, Self Care, Joint mobilization, Stair training, DME instructions, Aquatic Therapy, Dry Needling, Spinal mobilization, Manual therapy, and Re-evaluation.   PLAN FOR NEXT SESSION: HEP review and update, core strength, hip stretch, body mechanics training    Lanice Shirts, PT 03/06/2022, 2:48 PM

## 2022-03-08 ENCOUNTER — Encounter: Payer: Self-pay | Admitting: Physical Therapy

## 2022-03-08 ENCOUNTER — Ambulatory Visit: Payer: Medicare Other | Admitting: Physical Therapy

## 2022-03-08 DIAGNOSIS — M5459 Other low back pain: Secondary | ICD-10-CM | POA: Diagnosis not present

## 2022-03-08 DIAGNOSIS — M791 Myalgia, unspecified site: Secondary | ICD-10-CM

## 2022-03-08 DIAGNOSIS — R5381 Other malaise: Secondary | ICD-10-CM

## 2022-03-08 NOTE — Therapy (Signed)
OUTPATIENT PHYSICAL THERAPY TREATMENT NOTE   Patient Name: Haley Singh MRN: [id number] DOB:[birth year], 71 y.o., female Today's Date: 03/08/2022  PCP: Haley Mires, MD REFERRING PROVIDER: Gertie Gowda, DO  END OF SESSION:   PT End of Session - 03/08/22 1521     Visit Number 6    Number of Visits 12    Date for PT Re-Evaluation 04/17/22    Authorization Type [employer]    PT Start Time 0330    PT Stop Time 0412    PT Time Calculation (min) 42 min    Activity Tolerance Patient tolerated treatment well    Behavior During Therapy [facility] for tasks assessed/performed               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   Hyperlipidemia    Hypertension    followed by pcp  (11-10-2019 pt stated never had a stress test)   Insomnia    MDD (major depressive disorder)    Mild persistent asthma    followed by pcp and dr Haley Singh (pulmonologist)   RLS (restless legs syndrome)    Vertebral osteomyelitis (Haley Singh) 02/21/2021   Past Surgical History:  Procedure Laterality Date   BUBBLE STUDY  01/25/2021   Procedure: BUBBLE STUDY;  Surgeon: Haley Dresser, MD;  Location: [facility];  Service: Cardiovascular;;   CATARACT EXTRACTION W/ INTRAOCULAR LENS  IMPLANT, BILATERAL  2009; 2010   CYSTOSCOPY/URETEROSCOPY/HOLMIUM LASER/STENT PLACEMENT Right 08/26/2019   Procedure: CYSTOSCOPY/RETROGRADE/URETEROSCOPY/HOLMIUM LASER/STENT PLACEMENT;  Surgeon: Haley Mons, MD;  Location: [facility];  Service: Urology;  Laterality: Right;   CYSTOSCOPY/URETEROSCOPY/HOLMIUM LASER/STENT PLACEMENT Left 11/17/2019   Procedure: CYSTOSCOPY/URETEROSCOPY, RETROGRADE,LASER LITHOTRIPSY, STENT PLACEMENT;  Surgeon: Haley Mons, MD;  Location: [facility];  Service: Urology;  Laterality: Left;   CYSTOSCOPY/URETEROSCOPY/HOLMIUM LASER/STENT PLACEMENT Left 02/19/2021   Procedure: CYSTOSCOPY/URETEROSCOPY/HOLMIUM LASER/STENT PLACEMENT;  Surgeon: Haley Mallow, MD;  Location: [facility];  Service: Urology;  Laterality: Left;   EXPLORATORY LAPAROTOMY W/ BOWEL RESECTION  01-15-2004  @[facility]    small bowel resection for stricture/ benzoar   EXTRACORPOREAL SHOCK WAVE LITHOTRIPSY Right 03/27/2018   Procedure: EXTRACORPOREAL SHOCK WAVE LITHOTRIPSY (ESWL);  Surgeon: Haley Hughs, MD;  Location: [facility];  Service: Urology;  Laterality: Right;   EXTRACORPOREAL SHOCK WAVE LITHOTRIPSY  x2 2008;  2009;  2015   HOLMIUM LASER APPLICATION Left [id number]   Procedure: HOLMIUM LASER APPLICATION;  Surgeon: Haley Mons, MD;  [facility];  Service: Urology;  Laterality: Left;   IR FLUORO GUIDED NEEDLE PLC ASPIRATION/INJECTION LOC  02/22/2021   ORIF ANKLE FRACTURE Right 09/ 2008 @[facility]    per pt has retained hardware   TEE WITHOUT CARDIOVERSION N/A 01/25/2021   Procedure: TRANSESOPHAGEAL ECHOCARDIOGRAM (TEE);  Surgeon: Haley Dresser, MD;  Location: [facility];  Service: Cardiovascular;  Laterality: N/A;   TONSILLECTOMY  age 71   Patient Active Problem List   Diagnosis Date Noted   Fatigue due to excessive exertion 01/31/2022   Weight gain 01/31/2022   Penicillin allergy    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   Vertebral osteomyelitis ([place]) 02/21/2021   Intractable nausea and vomiting 02/18/2021   Hypokalemia 02/18/2021  MSSA bacteremia    Discitis of lumbar region    Osteomyelitis ([place]) 01/17/2021   Mild persistent asthma without complication [id number]   OSA (obstructive sleep apnea) 03/03/2014    REFERRING DIAG: [id number] (ICD-10-CM) - Fatigue due to excessive exertion, initial encounter M54.89 (ICD-10-CM) - Midline back pain, unspecified back location,  unspecified chronicity   THERAPY DIAG:  Other low back pain  Myalgia, unspecified site  Physical deconditioning  Rationale for Evaluation and Treatment Rehabilitation  PERTINENT HISTORY: Ms. Haley Singh is a 71 y/o female presenting to PM&R clinic as a referral from her PCP Haley Singh for pain s/p osteomyelitis . She states when they made the referral, she was in pain but that is no longer the case. She has persistent fatigue, which is the most bothersome thing for her. She also has occassional muscle spasms, but these self resolved.    Fatigue: Has been ongoing and debilitating over the past year. She has not gone to PT, had any medications, or other treatments specifically for fatigue. Does endorse family Hx hypothyroidism, but no personal Hx. Has had some mild weight gain with inactivity. She also endorses poor tolerance of hot weather, but denies any fatigue or pain associated with it. She denies any associated weakness, sensory changes, pain, diplopia or pruritis.    Pain: Her osteomyelitis was at L2-3, and she also has a bulging disc at L2. She was taking oxycodone at the time 2-3x daily in acute phase, but has been off of it since January 2023. She has been getting massages every few weeks to stretch her Psoas, which is helping with her posture.    Function/social: Lives in a 3 story town home by [employer]. Used to work part time at [employer], currently unemployed. She used to do woodworking and photography, but does not anymore because heat tends to make her fatigue worse. She used to exercise in her complex pool, but has also stopped doing that due to heat. She is independent of mobility and ADLs.    She is on venlafaxane for anger and it is very effect.  PRECAUTIONS: Other: osteomyelitis  SUBJECTIVE: Pt reports that she is sore (DOMs) after last PT session and she felt like she had "run a marathon".  She reports she is recovered now.    PAIN:  Are you having pain? No   OBJECTIVE:  (objective measures completed at initial evaluation unless otherwise dated)   DIAGNOSTIC FINDINGS:  EXAM: MRI LUMBAR SPINE WITHOUT CONTRAST   TECHNIQUE: Multiplanar, multisequence MR imaging of the lumbar spine was performed. No intravenous contrast was administered.   COMPARISON:  MR lumbar 04/09/2021 and 02/20/2021; CT lumbar 01/17/2021; X-ray lumbar 05/23/2011.   FINDINGS: Despite efforts by the technologist and patient, mild motion artifact is present on today's exam and could not be eliminated. This reduces exam sensitivity and specificity.   Segmentation: Conventional anatomy assumed, with the last open disc space designated L5-S1.Concordant with previous imaging.   Alignment: Stable chronic degenerative grade 1 anterolisthesis at L4-5 and mild convex right scoliosis. Mild retrolisthesis at L1-2 is unchanged.   Vertebrae: Again demonstrated are chronic sequela of discitis and osteomyelitis at L2-3 with partial collapse of the L2 and L3 vertebral bodies and irregularity of the intervening disc space. The associated marrow edema has improved, and no progressive bone destruction identified. Stable chronic endplate degenerative changes at L1-2. No new foci of osteomyelitis identified. Stable mild degenerative changes of the sacroiliac joints.   Conus medullaris: Extends to the L1-2 level and appears  normal. Stable sacral Tarlov cysts.   Paraspinal and other soft tissues: Improved paraspinal inflammatory changes at L2-3. No focal paraspinal or epidural fluid collection.   Disc levels:   No significant disc space findings from T9-10 through T12-L1 aside from anterior osteophytes and scattered perineural cysts.   L1-2: Stable chronic degenerative disc disease with loss of disc height, annular disc bulging and endplate osteophytes. There is moderate facet and ligamentous hypertrophy contributing to stable mild spinal stenosis and mild narrowing of the lateral recesses  and foramina bilaterally.   L2-3: As above, improving changes of diskitis/osteomyelitis. Stable annular disc bulging, endplate osteophytes, facet and ligamentous hypertrophy contributing to mild-to-moderate spinal stenosis. There is stable lateral recess and foraminal narrowing bilaterally. No epidural fluid collection identified.   L3-4: Stable annular disc bulging, facet and ligamentous hypertrophy contributing to mild spinal stenosis.   L4-5: Chronic loss of disc height with annular disc bulging and advanced facet hypertrophy, accounting for the grade 1 anterolisthesis. Stable mild foraminal narrowing bilaterally.   L5-S1: Stable chronic degenerative disc disease with posterior osteophytes and mild facet hypertrophy. No spinal stenosis or nerve root encroachment.   IMPRESSION: 1. Further improvement in the bone marrow edema and paraspinal inflammatory changes associated with previously demonstrated L2-3 discitis and osteomyelitis. No progressive vertebral body collapse or recurrent fluid collection identified. 2. No new findings. 3. Stable multilevel spondylosis as detailed above.     Electronically Signed   By: Richardean Sale M.D.   On: 07/04/2021 10:51   PATIENT SURVEYS:  FOTO 55(64 predicted)   SCREENING FOR RED FLAGS: negative   COGNITION:           Overall cognitive status: Within functional limits for tasks assessed                          SENSATION: Not tested   MUSCLE LENGTH: Hamstrings: Right 90 deg; Left 90 deg Thomas test: -20d from neutral B   POSTURE:  patient demos a forward flexed posture   PALPATION: Not tested   LUMBAR ROM:    Active  A/PROM  eval  Flexion WNL  Extension -25% from neutral  Right lateral flexion    Left lateral flexion    Right rotation    Left rotation     (Blank rows = not tested)   LOWER EXTREMITY ROM:      Active  Right eval Left eval  Hip flexion -20d -20d  Hip extension      Hip abduction      Hip  adduction      Hip internal rotation      Hip external rotation      Knee flexion      Knee extension      Ankle dorsiflexion      Ankle plantarflexion      Ankle inversion      Ankle eversion       (Blank rows = not tested)   LOWER EXTREMITY MMT:     MMT Right eval Left eval  Hip flexion      Hip extension      Hip abduction      Hip adduction      Hip internal rotation      Hip external rotation      Knee flexion      Knee extension      Ankle dorsiflexion      Ankle plantarflexion      Ankle inversion  Ankle eversion      Core/trunk flexion 3 3   (Blank rows = not tested)   LUMBAR SPECIAL TESTS:  Straight leg raise test: Negative, Slump test: Negative, Single leg stance test: Negative, FABER test: Positive, and Thomas test: Positive   FUNCTIONAL TESTS:  5 times sit to stand: 16s with arms crossed   GAIT: Distance walked: 15ft x2 Assistive device utilized: None Level of assistance: Complete Independence Comments: antalgic        TODAY'S TREATMENT   [facility] Adult PT Treatment:                                                DATE: 03/08/2022 Aquatic therapy at [address] - therapeutic pool temp 92 degrees Pt enters building ambulating with SPC. Treatment took place in water 3.8 to  4 ft 8 in.feet deep depending upon activity.  Pt entered and exited the pool via stair and handrails independently   Therapeutic Exercise: Walking forward/backwards/side stepping x 3 laps each Marching walk x3 laps with yellow dumbbells Side stepping with shoulder ab/adduction colorful dumbbells x3 laps Runners stretch on bottom step x30" BIL Hamstring stretch on bottom step x30" BIL Hip flexor stretch - 45'' Figure 4 squat stretch, BIL UE support x30" BIL At edge of pool, pt performed LE exercise: Hip abd/add x20 BIL Noodle stop - yellow - 20x ea Hip ext/flex with knee straight x 20 BIL Hip Circles CC/CCW x10 each BIL Marching hip flexion to knee extension  2x10 BIL Hamstring curl x20 BIL Squats 2x20 Sitting on bench in water: Bicycle kicks x1' Flutter kicks x1' Scissor kicks x1'  Pt requires the buoyancy of water for active assisted exercises with buoyancy supported for strengthening and AROM exercises. Hydrostatic pressure also supports joints by unweighting joint load by at least 50 % in 3-4 feet depth water. 80% in chest to neck deep water. Water will provide assistance with movement using the current and laminar flow while the buoyancy reduces weight bearing. Pt requires the viscosity of the water for resistance with strengthening exercises.  [place] Adult PT Treatment:                                                DATE: 03/06/22 Therapeutic Exercise: Curl ups 15x PPT 3s hold 15x Heel slides with PPT 15/15 Bridge w/ball 15x Bridge against RTB 15x Supine hip fallouts RTB 15x Prone lie 12s followed by PA mobs to L5-1 grade II, 10x each segment Nustep L3 8 min SLR w/PPT 15/15 1/2 roll  Standing at wall  opposite shoulder flexion/hip extension 5/5  [facility] Adult PT Treatment:                                                DATE: 03/02/2022 Aquatic therapy at [address] - therapeutic pool temp 92 degrees Pt enters building ambulating with SPC. Treatment took place in water 3.8 to  4 ft 8 in.feet deep depending upon activity.  Pt entered and exited the pool via stair and handrails independently  Pt pain level 0/10 at initiation  of water walking.  Therapeutic Exercise: Walking forward/backwards/side stepping x 2 laps each Marching walk x2 laps with yellow dumbbells Side stepping with shoulder ab/adduction colorful dumbbells x2 laps Runners stretch on bottom step x30" BIL Hamstring stretch on bottom step x30" BIL Figure 4 squat stretch, BIL UE support x30" BIL Warrior I with blue square noodle rows x20 BIL At edge of pool, pt performed LE exercise: Hip abd/add x20 BIL Hip ext/flex with knee straight x 20 BIL Hip Circles  CC/CCW x10 each BIL Marching hip flexion to knee extension 2x10 BIL Hamstring curl x20 BIL Squats 2x20 Sitting on bench in water: Bicycle kicks x1' Flutter kicks x1' Scissor kicks x1'  Pt requires the buoyancy of water for active assisted exercises with buoyancy supported for strengthening and AROM exercises. Hydrostatic pressure also supports joints by unweighting joint load by at least 50 % in 3-4 feet depth water. 80% in chest to neck deep water. Water will provide assistance with movement using the current and laminar flow while the buoyancy reduces weight bearing. Pt requires the viscosity of the water for resistance with strengthening exercises.   [place] Adult PT Treatment:                                                DATE: 02/27/22 Therapeutic Exercise: Curl ups 15x PPT 3s hold 15x Alternating march in supine w/PPT 15/15 Heel slides with PPT 15/15 Bridge w/ball 15x Bridge against RTB 15x Supine hip fallouts RTB 15x Prone lie 60s followed by PA mobs to L5-1 grade II, 10x each segment Nustep L2 8 min  [facility] Adult PT Treatment:                                                DATE: 02/23/2022 Aquatic therapy at [address] - therapeutic pool temp 92 degrees Pt enters building ambulating with SPC. Treatment took place in water 3.8 to  4 ft 8 in.feet deep depending upon activity.  Pt entered and exited the pool via stair and handrails independently  Pt pain level 0/10 at initiation of water walking.  Patient entered water for aquatic therapy for first time and was introduced to principles and therapeutic effects of water as they ambulated and acclimated to pool.  Therapeutic Exercise: Walking forward/backwards/side stepping x 2 laps each Marching walk x2 laps with colorful dumbbells Side stepping with shoulder ab/adduction colorful dumbbells x2 laps Runners stretch on bottom step x30" BIL Hamstring stretch on bottom step x30" BIL Figure 4 squat stretch, BIL UE  support 2x30" BIL STS from 3rd step from bottom 2x10 At edge of pool, pt performed LE exercise: Hip abd/add x20 BIL Hip ext/flex with knee straight x 20 BIL Hip Circles CC/CCW x10 each BIL Straight leg flexion to ext - 20x ea Resisted rotation with board - 20x ea Y noodle push down - 2x10 Kickboard push/pull - 2x10 Squats 2x20 Sitting on bench in water: Bicycle kicks x1' Reverse bicycle kicks x1' Flutter kicks x1' Scissor kicks x1'  Pt requires the buoyancy of water for active assisted exercises with buoyancy supported for strengthening and AROM exercises. Hydrostatic pressure also supports joints by unweighting joint load by at least 50 % in 3-4 feet depth water.  80% in chest to neck deep water. Water will provide assistance with movement using the current and laminar flow while the buoyancy reduces weight bearing. Pt requires the viscosity of the water for resistance with strengthening exercises.    PATIENT EDUCATION:  Education details: Discussed eval findings, rehab rationale and POC and patient is in agreement  Person educated: Patient Education method: Explanation Education comprehension: verbalized understanding and needs further education     HOME EXERCISE PROGRAM: Access Code: [email or web address] URL: [email or web address] Date: 02/27/2022 Prepared by: Sharlynn Oliphant  Exercises - Hip Flexor Stretch at [facility] of Bed  - 2 x daily - 5 x weekly - 1 sets - 3 reps - 30s hold - Supine Posterior Pelvic Tilt  - 2 x daily - 5 x weekly - 1 sets - 15 reps - Supine Bridge  - 2 x daily - 5 x weekly - 1 sets - 15 reps - Sidelying Open Book Thoracic Lumbar Rotation and Extension  - 2 x daily - 5 x weekly - 1 sets - 10 reps   ASSESSMENT:   CLINICAL IMPRESSION: Session today focused on hip and core strengthening in the aquatic environment for use of buoyancy to offload joints and the viscosity of water as resistance during therapeutic exercise.  Patient was able to tolerate all  prescribed exercises in the aquatic environment with no adverse effects and reports 0/10 pain at the end of the session. Patient continues to benefit from skilled PT services on land and aquatic based and should be progressed as able to improve functional independence.    OBJECTIVE IMPAIRMENTS Abnormal gait, decreased activity tolerance, decreased balance, decreased endurance, decreased knowledge of condition, decreased mobility, difficulty walking, decreased ROM, increased muscle spasms, improper body mechanics, postural dysfunction, and pain.    ACTIVITY LIMITATIONS carrying, lifting, standing, and bed mobility   PERSONAL FACTORS Age, Past/current experiences, Time since onset of injury/illness/exacerbation, and 1 comorbidity: osteomyelitis  are also affecting patient's functional outcome.    REHAB POTENTIAL: Good   CLINICAL DECISION MAKING: Evolving/moderate complexity   EVALUATION COMPLEXITY: Moderate     GOALS: Goals reviewed with patient? Yes   SHORT TERM GOALS: Target date: 03/13/2022   Patient to demonstrate independence in HEP  Baseline:[id number] Goal status: INITIAL   2.  Increase B hip extension to -10d from neutral Baseline: -20d from neutral Goal status: INITIAL   3.  Patient to perform 10 curl ups Baseline: 1 performed at eval Goal status: INITIAL       LONG TERM GOALS: Target date: 04/03/2022   0d hip extension  Baseline: -20d from neutral Goal status: INITIAL   2.  Decrease 5x STS time to <15s Baseline: 16s arms crossed Goal status: INITIAL   3.  Increase core strength to 3+/5 Baseline: 3/5 as evidenced by inability to perform curl up Goal status: INITIAL   4.  Increase FOTO score to 64 Baseline: 55 Goal status: INITIAL         PLAN: PT FREQUENCY: 2x/week   PT DURATION: 6 weeks   PLANNED INTERVENTIONS: Therapeutic exercises, Therapeutic activity, Neuromuscular re-education, Balance training, Gait training, Patient/Family education, Self Care,  Joint mobilization, Stair training, DME instructions, Aquatic Therapy, Dry Needling, Spinal mobilization, Manual therapy, and Re-evaluation.   PLAN FOR NEXT SESSION: HEP review and update, core strength, hip stretch, body mechanics training    Mathis Dad, PT 03/08/2022, 4:17 PM

## 2022-03-13 ENCOUNTER — Ambulatory Visit: Payer: Medicare Other

## 2022-03-13 DIAGNOSIS — M791 Myalgia, unspecified site: Secondary | ICD-10-CM

## 2022-03-13 DIAGNOSIS — R5381 Other malaise: Secondary | ICD-10-CM

## 2022-03-13 DIAGNOSIS — M5459 Other low back pain: Secondary | ICD-10-CM

## 2022-03-13 NOTE — Therapy (Signed)
OUTPATIENT PHYSICAL THERAPY TREATMENT NOTE   Patient Name: Haley Singh MRN: [id number] DOB:12/23/1950, 71 y.o., female Today's Date: 03/13/2022  PCP: Katherina Mires, MD REFERRING PROVIDER: Gertie Gowda, DO  END OF SESSION:   PT End of Session - 03/13/22 1405     Visit Number 7    Number of Visits 12    Date for PT Re-Evaluation 04/17/22    Authorization Type U[employer]    PT Start Time 1405    Activity Tolerance Patient tolerated treatment well    Behavior During Therapy [facility] for tasks assessed/performed               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   Hyperlipidemia    Hypertension    followed by pcp  (11-10-2019 pt stated never had a stress test)   Insomnia    MDD (major depressive disorder)    Mild persistent asthma    followed by pcp and dr Bernita Raisin (pulmonologist)   RLS (restless legs syndrome)    Vertebral osteomyelitis ([place]) 02/21/2021   Past Surgical History:  Procedure Laterality Date   BUBBLE STUDY  01/25/2021   Procedure: BUBBLE STUDY;  Surgeon: Buford Dresser, MD;  Location: [place];  Service: Cardiovascular;;   CATARACT EXTRACTION W/ INTRAOCULAR LENS  IMPLANT, BILATERAL  2009; 2010   CYSTOSCOPY/URETEROSCOPY/HOLMIUM LASER/STENT PLACEMENT Right 08/26/2019   Procedure: CYSTOSCOPY/RETROGRADE/URETEROSCOPY/HOLMIUM LASER/STENT PLACEMENT;  Surgeon: Ceasar Mons, MD;  Location: [facility];  Service: Urology;  Laterality: Right;   CYSTOSCOPY/URETEROSCOPY/HOLMIUM LASER/STENT PLACEMENT Left 11/17/2019   Procedure: CYSTOSCOPY/URETEROSCOPY, RETROGRADE,LASER LITHOTRIPSY, STENT PLACEMENT;  Surgeon: Ceasar Mons, MD;  Location: [facility];   Service: Urology;  Laterality: Left;   CYSTOSCOPY/URETEROSCOPY/HOLMIUM LASER/STENT PLACEMENT Left 02/19/2021   Procedure: CYSTOSCOPY/URETEROSCOPY/HOLMIUM LASER/STENT PLACEMENT;  Surgeon: Lucas Mallow, MD;  Location: [facility];  Service: Urology;  Laterality: Left;   EXPLORATORY LAPAROTOMY W/ BOWEL RESECTION  01-15-2004  @[facility]    small bowel resection for stricture/ benzoar   EXTRACORPOREAL SHOCK WAVE LITHOTRIPSY Right 03/27/2018   Procedure: EXTRACORPOREAL SHOCK WAVE LITHOTRIPSY (ESWL);  Surgeon: Ardis Hughs, MD;  Location: [facility];  Service: Urology;  Laterality: Right;   EXTRACORPOREAL SHOCK WAVE LITHOTRIPSY  x2 2008;  2009;  2015   HOLMIUM LASER APPLICATION Left 0/35/0093   Procedure: HOLMIUM LASER APPLICATION;  Surgeon: Ceasar Mons, MD;  Location: [facility];  Service: Urology;  Laterality: Left;   IR FLUORO GUIDED NEEDLE PLC ASPIRATION/INJECTION LOC  02/22/2021   ORIF ANKLE FRACTURE Right 09/ 2008 @[facility]    per pt has retained hardware   TEE WITHOUT CARDIOVERSION N/A 01/25/2021   Procedure: TRANSESOPHAGEAL ECHOCARDIOGRAM (TEE);  Surgeon: Buford Dresser, MD;  Location: [facility];  Service: Cardiovascular;  Laterality: N/A;   TONSILLECTOMY  age 1   Patient Active Problem List   Diagnosis Date Noted   Fatigue due to excessive exertion 01/31/2022   Weight gain 01/31/2022   Penicillin allergy    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   Vertebral osteomyelitis ([place]) 02/21/2021   Intractable nausea and vomiting 02/18/2021   Hypokalemia 02/18/2021   MSSA bacteremia    Discitis of lumbar region    Osteomyelitis ([place]) 01/17/2021  Mild persistent asthma without complication 10/93/2355   OSA (obstructive sleep apnea) 03/03/2014    REFERRING DIAG: D32.3XXA (ICD-10-CM) - Fatigue due to excessive exertion, initial encounter M54.89 (ICD-10-CM) - Midline back pain, unspecified back location, unspecified chronicity   THERAPY DIAG:  Other low back  pain  Myalgia, unspecified site  Physical deconditioning  Rationale for Evaluation and Treatment Rehabilitation  PERTINENT HISTORY: Haley Singh is a 71 y/o female presenting to PM&R clinic as a referral from her PCP Dr. Reed Breech for pain s/p osteomyelitis . She states when they made the referral, she was in pain but that is no longer the case. She has persistent fatigue, which is the most bothersome thing for her. She also has occassional muscle spasms, but these self resolved.    Fatigue: Has been ongoing and debilitating over the past year. She has not gone to PT, had any medications, or other treatments specifically for fatigue. Does endorse family Hx hypothyroidism, but no personal Hx. Has had some mild weight gain with inactivity. She also endorses poor tolerance of hot weather, but denies any fatigue or pain associated with it. She denies any associated weakness, sensory changes, pain, diplopia or pruritis.    Pain: Her osteomyelitis was at L2-3, and she also has a bulging disc at L2. She was taking oxycodone at the time 2-3x daily in acute phase, but has been off of it since January 2023. She has been getting massages every few weeks to stretch her Psoas, which is helping with her posture.    Function/social: Lives in a 3 story town home by [employer]. Used to work part time at [employer], currently unemployed. She used to do woodworking and photography, but does not anymore because heat tends to make her fatigue worse. She used to exercise in her complex pool, but has also stopped doing that due to heat. She is independent of mobility and ADLs.    She is on venlafaxane for anger and it is very effect.  PRECAUTIONS: Other: osteomyelitis  SUBJECTIVE: Reports improved posture and tolerance to upright tasks.  Able to mobilize w/o need of cane for longer periods of time.  PAIN:  Are you having pain? No   OBJECTIVE: (objective measures completed at initial evaluation unless otherwise  dated)   DIAGNOSTIC FINDINGS:  EXAM: MRI LUMBAR SPINE WITHOUT CONTRAST   TECHNIQUE: Multiplanar, multisequence MR imaging of the lumbar spine was performed. No intravenous contrast was administered.   COMPARISON:  MR lumbar 04/09/2021 and 02/20/2021; CT lumbar 01/17/2021; X-ray lumbar 05/23/2011.   FINDINGS: Despite efforts by the technologist and patient, mild motion artifact is present on today's exam and could not be eliminated. This reduces exam sensitivity and specificity.   Segmentation: Conventional anatomy assumed, with the last open disc space designated L5-S1.Concordant with previous imaging.   Alignment: Stable chronic degenerative grade 1 anterolisthesis at L4-5 and mild convex right scoliosis. Mild retrolisthesis at L1-2 is unchanged.   Vertebrae: Again demonstrated are chronic sequela of discitis and osteomyelitis at L2-3 with partial collapse of the L2 and L3 vertebral bodies and irregularity of the intervening disc space. The associated marrow edema has improved, and no progressive bone destruction identified. Stable chronic endplate degenerative changes at L1-2. No new foci of osteomyelitis identified. Stable mild degenerative changes of the sacroiliac joints.   Conus medullaris: Extends to the L1-2 level and appears normal. Stable sacral Tarlov cysts.   Paraspinal and other soft tissues: Improved paraspinal inflammatory changes at L2-3. No focal paraspinal or epidural fluid collection.  Disc levels:   No significant disc space findings from T9-10 through T12-L1 aside from anterior osteophytes and scattered perineural cysts.   L1-2: Stable chronic degenerative disc disease with loss of disc height, annular disc bulging and endplate osteophytes. There is moderate facet and ligamentous hypertrophy contributing to stable mild spinal stenosis and mild narrowing of the lateral recesses and foramina bilaterally.   L2-3: As above, improving changes of  diskitis/osteomyelitis. Stable annular disc bulging, endplate osteophytes, facet and ligamentous hypertrophy contributing to mild-to-moderate spinal stenosis. There is stable lateral recess and foraminal narrowing bilaterally. No epidural fluid collection identified.   L3-4: Stable annular disc bulging, facet and ligamentous hypertrophy contributing to mild spinal stenosis.   L4-5: Chronic loss of disc height with annular disc bulging and advanced facet hypertrophy, accounting for the grade 1 anterolisthesis. Stable mild foraminal narrowing bilaterally.   L5-S1: Stable chronic degenerative disc disease with posterior osteophytes and mild facet hypertrophy. No spinal stenosis or nerve root encroachment.   IMPRESSION: 1. Further improvement in the bone marrow edema and paraspinal inflammatory changes associated with previously demonstrated L2-3 discitis and osteomyelitis. No progressive vertebral body collapse or recurrent fluid collection identified. 2. No new findings. 3. Stable multilevel spondylosis as detailed above.     Electronically Signed   By: Richardean Sale M.D.   On: 07/04/2021 10:51   PATIENT SURVEYS:  FOTO 55(64 predicted); 03/13/22 45   SCREENING FOR RED FLAGS: negative   COGNITION:           Overall cognitive status: Within functional limits for tasks assessed                          SENSATION: Not tested   MUSCLE LENGTH: Hamstrings: Right 90 deg; Left 90 deg Thomas test: -20d from neutral B   POSTURE:  patient demos a forward flexed posture   PALPATION: Not tested   LUMBAR ROM:    Active  A/PROM  eval  Flexion WNL  Extension -25% from neutral  Right lateral flexion    Left lateral flexion    Right rotation    Left rotation     (Blank rows = not tested)   LOWER EXTREMITY ROM:      Active  Right eval Left eval  Hip flexion -20d -20d  Hip extension      Hip abduction      Hip adduction      Hip internal rotation      Hip external  rotation      Knee flexion      Knee extension      Ankle dorsiflexion      Ankle plantarflexion      Ankle inversion      Ankle eversion       (Blank rows = not tested)   LOWER EXTREMITY MMT:     MMT Right eval Left eval  Hip flexion      Hip extension      Hip abduction      Hip adduction      Hip internal rotation      Hip external rotation      Knee flexion      Knee extension      Ankle dorsiflexion      Ankle plantarflexion      Ankle inversion      Ankle eversion      Core/trunk flexion 3 3   (Blank rows = not tested)   LUMBAR  SPECIAL TESTS:  Straight leg raise test: Negative, Slump test: Negative, Single leg stance test: Negative, FABER test: Positive, and Thomas test: Positive   FUNCTIONAL TESTS:  5 times sit to stand: 16s with arms crossed   GAIT: Distance walked: 77f x2 Assistive device utilized: None Level of assistance: Complete Independence Comments: antalgic        TODAY'S TREATMENT  [facility] Adult PT Treatment:                                                DATE: 03/13/22 Therapeutic Exercise: Curl ups 15x PPT 3s hold 15x Heel slides with PPT 15/15 Bridge w/ball 2000g 15x Bridge against GTB 15x Supine hip fallouts GTB 15x Prone lie 120s followed by PA mobs to L5-1 grade II, 10x each segment Nustep L4 8 min SLR w/PPT 15/15 1/2 roll  Standing at wall  opposite shoulder flexion/hip extension 7/7   [facility] Adult PT Treatment:                                                DATE: 03/08/2022 Aquatic therapy at [address] - therapeutic pool temp 92 degrees Pt enters building ambulating with SPC. Treatment took place in water 3.8 to  4 ft 8 in.feet deep depending upon activity.  Pt entered and exited the pool via stair and handrails independently   Therapeutic Exercise: Walking forward/backwards/side stepping x 3 laps each Marching walk x3 laps with yellow dumbbells Side stepping with shoulder ab/adduction colorful dumbbells x3  laps Runners stretch on bottom step x30" BIL Hamstring stretch on bottom step x30" BIL Hip flexor stretch - 45'' Figure 4 squat stretch, BIL UE support x30" BIL At edge of pool, pt performed LE exercise: Hip abd/add x20 BIL Noodle stop - yellow - 20x ea Hip ext/flex with knee straight x 20 BIL Hip Circles CC/CCW x10 each BIL Marching hip flexion to knee extension 2x10 BIL Hamstring curl x20 BIL Squats 2x20 Sitting on bench in water: Bicycle kicks x1' Flutter kicks x1' Scissor kicks x1'  Pt requires the buoyancy of water for active assisted exercises with buoyancy supported for strengthening and AROM exercises. Hydrostatic pressure also supports joints by unweighting joint load by at least 50 % in 3-4 feet depth water. 80% in chest to neck deep water. Water will provide assistance with movement using the current and laminar flow while the buoyancy reduces weight bearing. Pt requires the viscosity of the water for resistance with strengthening exercises.  [facility] Treatment:                                                DATE: 03/06/22 Therapeutic Exercise: Curl ups 15x PPT 3s hold 15x Heel slides with PPT 15/15 Bridge w/ball 15x Bridge against RTB 15x Supine hip fallouts RTB 15x Prone lie 12s followed by PA mobs to L5-1 grade II, 10x each segment Nustep L3 8 min SLR w/PPT 15/15 1/2 roll  Standing at wall  opposite shoulder flexion/hip extension 5/5  [facility] Adult PT Treatment:  DATE: 03/02/2022 Aquatic therapy at [address] - therapeutic pool temp 92 degrees Pt enters building ambulating with SPC. Treatment took place in water 3.8 to  4 ft 8 in.feet deep depending upon activity.  Pt entered and exited the pool via stair and handrails independently  Pt pain level 0/10 at initiation of water walking.  Therapeutic Exercise: Walking forward/backwards/side stepping x 2 laps each Marching walk x2 laps with yellow  dumbbells Side stepping with shoulder ab/adduction colorful dumbbells x2 laps Runners stretch on bottom step x30" BIL Hamstring stretch on bottom step x30" BIL Figure 4 squat stretch, BIL UE support x30" BIL Warrior I with blue square noodle rows x20 BIL At edge of pool, pt performed LE exercise: Hip abd/add x20 BIL Hip ext/flex with knee straight x 20 BIL Hip Circles CC/CCW x10 each BIL Marching hip flexion to knee extension 2x10 BIL Hamstring curl x20 BIL Squats 2x20 Sitting on bench in water: Bicycle kicks x1' Flutter kicks x1' Scissor kicks x1'  Pt requires the buoyancy of water for active assisted exercises with buoyancy supported for strengthening and AROM exercises. Hydrostatic pressure also supports joints by unweighting joint load by at least 50 % in 3-4 feet depth water. 80% in chest to neck deep water. Water will provide assistance with movement using the current and laminar flow while the buoyancy reduces weight bearing. Pt requires the viscosity of the water for resistance with strengthening exercises.      PATIENT EDUCATION:  Education details: Discussed eval findings, rehab rationale and POC and patient is in agreement  Person educated: Patient Education method: Explanation Education comprehension: verbalized understanding and needs further education     HOME EXERCISE PROGRAM: Access Code: [email or web address] URL: [email or web address] Date: 02/27/2022 Prepared by: Sharlynn Oliphant  Exercises - Hip Flexor Stretch at [facility] of Bed  - 2 x daily - 5 x weekly - 1 sets - 3 reps - 30s hold - Supine Posterior Pelvic Tilt  - 2 x daily - 5 x weekly - 1 sets - 15 reps - Supine Bridge  - 2 x daily - 5 x weekly - 1 sets - 15 reps - Sidelying Open Book Thoracic Lumbar Rotation and Extension  - 2 x daily - 5 x weekly - 1 sets - 10 reps   ASSESSMENT:   CLINICAL IMPRESSION: Patient consistently ambulating with more upright posture, pain has been minimal to none and has had  to rely on cane less during ambulation.  FOTO score shows a decline despite improved function.  Increased resistance/difficulty as noted on exercises.  Ambulated into clinic w/o need of cane.   OBJECTIVE IMPAIRMENTS Abnormal gait, decreased activity tolerance, decreased balance, decreased endurance, decreased knowledge of condition, decreased mobility, difficulty walking, decreased ROM, increased muscle spasms, improper body mechanics, postural dysfunction, and pain.    ACTIVITY LIMITATIONS carrying, lifting, standing, and bed mobility   PERSONAL FACTORS Age, Past/current experiences, Time since onset of injury/illness/exacerbation, and 1 comorbidity: osteomyelitis  are also affecting patient's functional outcome.    REHAB POTENTIAL: Good   CLINICAL DECISION MAKING: Evolving/moderate complexity   EVALUATION COMPLEXITY: Moderate     GOALS: Goals reviewed with patient? Yes   SHORT TERM GOALS: Target date: 03/13/2022   Patient to demonstrate independence in HEP  Baseline:[id number] Goal status: Met   2.  Increase B hip extension to -10d from neutral Baseline: -20d from neutral; 03/13/22 -10d from neutral observed during treatment. Goal status: Met   3.  Patient to perform 10 curl ups Baseline:  1 performed at eval; 03/13/22 10-15 reps Goal status: Met       LONG TERM GOALS: Target date: 04/03/2022   0d hip extension  Baseline: -20d from neutral Goal status: INITIAL   2.  Decrease 5x STS time to <15s Baseline: 16s arms crossed Goal status: INITIAL   3.  Increase core strength to 3+/5 Baseline: 3/5 as evidenced by inability to perform curl up Goal status: INITIAL   4.  Increase FOTO score to 64 Baseline: 55 Goal status: INITIAL         PLAN: PT FREQUENCY: 2x/week   PT DURATION: 6 weeks   PLANNED INTERVENTIONS: Therapeutic exercises, Therapeutic activity, Neuromuscular re-education, Balance training, Gait training, Patient/Family education, Self Care, Joint  mobilization, Stair training, DME instructions, Aquatic Therapy, Dry Needling, Spinal mobilization, Manual therapy, and Re-evaluation.   PLAN FOR NEXT SESSION: HEP review and update, core strength, hip stretch, body mechanics training    Lanice Shirts, PT 03/13/2022, 2:06 PM

## 2022-03-15 ENCOUNTER — Ambulatory Visit: Payer: Medicare Other | Admitting: Physical Therapy

## 2022-03-15 ENCOUNTER — Encounter: Payer: Self-pay | Admitting: Physical Therapy

## 2022-03-15 DIAGNOSIS — M868X8 Other osteomyelitis, other site: Secondary | ICD-10-CM

## 2022-03-15 DIAGNOSIS — M5459 Other low back pain: Secondary | ICD-10-CM | POA: Diagnosis not present

## 2022-03-15 DIAGNOSIS — R5381 Other malaise: Secondary | ICD-10-CM

## 2022-03-15 DIAGNOSIS — M791 Myalgia, unspecified site: Secondary | ICD-10-CM

## 2022-03-15 NOTE — Therapy (Signed)
OUTPATIENT PHYSICAL THERAPY TREATMENT NOTE   Patient Name: Haley Singh MRN: [id number] DOB:January 09, 1951, 71 y.o., female Today's Date: 03/15/2022  PCP: Katherina Mires, MD REFERRING PROVIDER: Gertie Gowda, DO  END OF SESSION:   PT End of Session - 03/15/22 1353     Visit Number 8    Number of Visits 12    Date for PT Re-Evaluation 04/17/22    Authorization Type [employer]    PT Start Time 1355    PT Stop Time 4270    PT Time Calculation (min) 44 min    Activity Tolerance Patient tolerated treatment well    Behavior During Therapy WFL for tasks assessed/performed                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   Hyperlipidemia    Hypertension    followed by pcp  (11-10-2019 pt stated never had a stress test)   Insomnia    MDD (major depressive disorder)    Mild persistent asthma    followed by pcp and dr Bernita Raisin (pulmonologist)   RLS (restless legs syndrome)    Vertebral osteomyelitis ([place]) 02/21/2021   Past Surgical History:  Procedure Laterality Date   BUBBLE STUDY  01/25/2021   Procedure: BUBBLE STUDY;  Surgeon: Buford Dresser, MD;  Location: [place];  Service: Cardiovascular;;   CATARACT EXTRACTION W/ INTRAOCULAR LENS  IMPLANT, BILATERAL  2009; 2010   CYSTOSCOPY/URETEROSCOPY/HOLMIUM LASER/STENT PLACEMENT Right 08/26/2019   Procedure: CYSTOSCOPY/RETROGRADE/URETEROSCOPY/HOLMIUM LASER/STENT PLACEMENT;  Surgeon: Ceasar Mons, MD;  Location: [facility];  Service: Urology;  Laterality: Right;   CYSTOSCOPY/URETEROSCOPY/HOLMIUM LASER/STENT PLACEMENT Left 11/17/2019   Procedure: CYSTOSCOPY/URETEROSCOPY, RETROGRADE,LASER LITHOTRIPSY, STENT PLACEMENT;  Surgeon: Ceasar Mons, MD;  Location: [facility];  Service: Urology;  Laterality: Left;   CYSTOSCOPY/URETEROSCOPY/HOLMIUM LASER/STENT PLACEMENT Left 02/19/2021   Procedure: CYSTOSCOPY/URETEROSCOPY/HOLMIUM LASER/STENT PLACEMENT;  Surgeon: Lucas Mallow, MD;  Location: [facility];  Service: Urology;  Laterality: Left;   EXPLORATORY LAPAROTOMY W/ BOWEL RESECTION  01-15-2004  @[facility]    small bowel resection for stricture/ benzoar   EXTRACORPOREAL SHOCK WAVE LITHOTRIPSY Right 03/27/2018   Procedure: EXTRACORPOREAL SHOCK WAVE LITHOTRIPSY (ESWL);  Surgeon: Ardis Hughs, MD;  Location: [facility];  Service: Urology;  Laterality: Right;   EXTRACORPOREAL SHOCK WAVE LITHOTRIPSY  x2 2008;  2009;  2015   HOLMIUM LASER APPLICATION Left 11/25/7626   Procedure: HOLMIUM LASER APPLICATION;  Surgeon: Ceasar Mons, MD;  Location: [facility];  Service: Urology;  Laterality: Left;   IR FLUORO GUIDED NEEDLE PLC ASPIRATION/INJECTION LOC  02/22/2021   ORIF ANKLE FRACTURE Right 09/ 2008 @[facility]    per pt has retained hardware   TEE WITHOUT CARDIOVERSION N/A 01/25/2021   Procedure: TRANSESOPHAGEAL ECHOCARDIOGRAM (TEE);  Surgeon: Buford Dresser, MD;  Location: [facility];  Service: Cardiovascular;  Laterality: N/A;   TONSILLECTOMY  age 34   Patient Active Problem List   Diagnosis Date Noted   Fatigue due to excessive exertion 01/31/2022   Weight gain 01/31/2022   Penicillin allergy    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   Vertebral osteomyelitis ([place]) 02/21/2021   Intractable nausea and vomiting 02/18/2021   Hypokalemia 02/18/2021  MSSA bacteremia    Discitis of lumbar region    Osteomyelitis ([place]) 01/17/2021   Mild persistent asthma without complication 16/03/9603   OSA (obstructive sleep apnea) 03/03/2014    REFERRING DIAG: V40.3XXA (ICD-10-CM) - Fatigue due to excessive exertion, initial encounter M54.89 (ICD-10-CM) - Midline back pain, unspecified back location,  unspecified chronicity   THERAPY DIAG:  Other low back pain  Myalgia, unspecified site  Physical deconditioning  Other osteomyelitis, other site [facility])  Rationale for Evaluation and Treatment Rehabilitation  PERTINENT HISTORY: [name] is a 71 y/o female presenting to PM&R clinic as a referral from her PCP Dr. Reed Breech for pain s/p osteomyelitis . She states when they made the referral, she was in pain but that is no longer the case. She has persistent fatigue, which is the most bothersome thing for her. She also has occassional muscle spasms, but these self resolved.    Fatigue: Has been ongoing and debilitating over the past year. She has not gone to PT, had any medications, or other treatments specifically for fatigue. Does endorse family Hx hypothyroidism, but no personal Hx. Has had some mild weight gain with inactivity. She also endorses poor tolerance of hot weather, but denies any fatigue or pain associated with it. She denies any associated weakness, sensory changes, pain, diplopia or pruritis.    Pain: Her osteomyelitis was at L2-3, and she also has a bulging disc at L2. She was taking oxycodone at the time 2-3x daily in acute phase, but has been off of it since January 2023. She has been getting massages every few weeks to stretch her Psoas, which is helping with her posture.    Function/social: Lives in a 3 story town home by [employer]. Used to work part time at [employer], currently unemployed. She used to do woodworking and photography, but does not anymore because heat tends to make her fatigue worse. She used to exercise in her complex pool, but has also stopped doing that due to heat. She is independent of mobility and ADLs.    She is on venlafaxane for anger and it is very effect.  PRECAUTIONS: Other: osteomyelitis  SUBJECTIVE:  Pt reports that she is doing well overall but is a bit fatigued following PT yesterday.  PAIN:  Are you having pain? No   OBJECTIVE:  (objective measures completed at initial evaluation unless otherwise dated)   DIAGNOSTIC FINDINGS:  EXAM: MRI LUMBAR SPINE WITHOUT CONTRAST   TECHNIQUE: Multiplanar, multisequence MR imaging of the lumbar spine was performed. No intravenous contrast was administered.   COMPARISON:  MR lumbar 04/09/2021 and 02/20/2021; CT lumbar 01/17/2021; X-ray lumbar 05/23/2011.   FINDINGS: Despite efforts by the technologist and patient, mild motion artifact is present on today's exam and could not be eliminated. This reduces exam sensitivity and specificity.   Segmentation: Conventional anatomy assumed, with the last open disc space designated L5-S1.Concordant with previous imaging.   Alignment: Stable chronic degenerative grade 1 anterolisthesis at L4-5 and mild convex right scoliosis. Mild retrolisthesis at L1-2 is unchanged.   Vertebrae: Again demonstrated are chronic sequela of discitis and osteomyelitis at L2-3 with partial collapse of the L2 and L3 vertebral bodies and irregularity of the intervening disc space. The associated marrow edema has improved, and no progressive bone destruction identified. Stable chronic endplate degenerative changes at L1-2. No new foci of osteomyelitis identified. Stable mild degenerative changes of the sacroiliac joints.   Conus medullaris: Extends to the L1-2 level and appears normal. Stable sacral Tarlov cysts.  Paraspinal and other soft tissues: Improved paraspinal inflammatory changes at L2-3. No focal paraspinal or epidural fluid collection.   Disc levels:   No significant disc space findings from T9-10 through T12-L1 aside from anterior osteophytes and scattered perineural cysts.   L1-2: Stable chronic degenerative disc disease with loss of disc height, annular disc bulging and endplate osteophytes. There is moderate facet and ligamentous hypertrophy contributing to stable mild spinal stenosis and mild narrowing of the lateral recesses  and foramina bilaterally.   L2-3: As above, improving changes of diskitis/osteomyelitis. Stable annular disc bulging, endplate osteophytes, facet and ligamentous hypertrophy contributing to mild-to-moderate spinal stenosis. There is stable lateral recess and foraminal narrowing bilaterally. No epidural fluid collection identified.   L3-4: Stable annular disc bulging, facet and ligamentous hypertrophy contributing to mild spinal stenosis.   L4-5: Chronic loss of disc height with annular disc bulging and advanced facet hypertrophy, accounting for the grade 1 anterolisthesis. Stable mild foraminal narrowing bilaterally.   L5-S1: Stable chronic degenerative disc disease with posterior osteophytes and mild facet hypertrophy. No spinal stenosis or nerve root encroachment.   IMPRESSION: 1. Further improvement in the bone marrow edema and paraspinal inflammatory changes associated with previously demonstrated L2-3 discitis and osteomyelitis. No progressive vertebral body collapse or recurrent fluid collection identified. 2. No new findings. 3. Stable multilevel spondylosis as detailed above.     Electronically Signed   By: Richardean Sale M.D.   On: 07/04/2021 10:51   PATIENT SURVEYS:  FOTO 55(64 predicted); 03/13/22 45   SCREENING FOR RED FLAGS: negative   COGNITION:           Overall cognitive status: Within functional limits for tasks assessed                          SENSATION: Not tested   MUSCLE LENGTH: Hamstrings: Right 90 deg; Left 90 deg Thomas test: -20d from neutral B   POSTURE:  patient demos a forward flexed posture   PALPATION: Not tested   LUMBAR ROM:    Active  A/PROM  eval  Flexion WNL  Extension -25% from neutral  Right lateral flexion    Left lateral flexion    Right rotation    Left rotation     (Blank rows = not tested)   LOWER EXTREMITY ROM:      Active  Right eval Left eval  Hip flexion -20d -20d  Hip extension      Hip abduction       Hip adduction      Hip internal rotation      Hip external rotation      Knee flexion      Knee extension      Ankle dorsiflexion      Ankle plantarflexion      Ankle inversion      Ankle eversion       (Blank rows = not tested)   LOWER EXTREMITY MMT:     MMT Right eval Left eval  Hip flexion      Hip extension      Hip abduction      Hip adduction      Hip internal rotation      Hip external rotation      Knee flexion      Knee extension      Ankle dorsiflexion      Ankle plantarflexion      Ankle inversion      Ankle  eversion      Core/trunk flexion 3 3   (Blank rows = not tested)   LUMBAR SPECIAL TESTS:  Straight leg raise test: Negative, Slump test: Negative, Single leg stance test: Negative, FABER test: Positive, and Thomas test: Positive   FUNCTIONAL TESTS:  5 times sit to stand: 16s with arms crossed   GAIT: Distance walked: 3f x2 Assistive device utilized: None Level of assistance: Complete Independence Comments: antalgic        TODAY'S TREATMENT   [facility] Adult PT Treatment:                                                DATE: 03/15/2022 [address] therapeutic pool temp 92 degrees Pt enters building ambulating with SPC. Treatment took place in water 3.8 to  4 ft 8 in.feet deep depending upon activity.  Pt entered and exited the pool via stair and handrails independently   Therapeutic Exercise: Walking forward/backwards/side stepping x 3 laps each Marching walk x3 laps with yellow dumbbells Side stepping with shoulder ab/adduction colorful dumbbells x3 laps Runners stretch on bottom step x30" BIL Hamstring stretch on bottom step x30" BIL Hip flexor stretch - 45'' Figure 4 squat stretch, BIL UE support x30" BIL Riding yellow pool noodle and breast stroke across pool - 2 laps At with yellow DB pt performed LE exercise: Hip abd/add x20 BIL Noodle stomp - yellow - 2x20 ea Hip ext/flex with knee straight x  20 BIL Hip Circles CC/CCW x20 each BIL  Pt requires the buoyancy of water for active assisted exercises with buoyancy supported for strengthening and AROM exercises. Hydrostatic pressure also supports joints by unweighting joint load by at least 50 % in 3-4 feet depth water. 80% in chest to neck deep water. Water will provide assistance with movement using the current and laminar flow while the buoyancy reduces weight bearing. Pt requires the viscosity of the water for resistance with strengthening exercises.  [facility]:                                                DATE: 03/13/22 Therapeutic Exercise: Curl ups 15x PPT 3s hold 15x Heel slides with PPT 15/15 Bridge w/ball 2000g 15x Bridge against GTB 15x Supine hip fallouts GTB 15x Prone lie 120s followed by PA mobs to L5-1 grade II, 10x each segment Nustep L4 8 min SLR w/PPT 15/15 1/2 roll  Standing at wall  opposite shoulder flexion/hip extension 7/7   [facility] Adult PT Treatment:                                                DATE: 03/08/2022 [address] therapeutic pool temp 92 degrees Pt enters building ambulating with SPC. Treatment took place in water 3.8 to  4 ft 8 in.feet deep depending upon activity.  Pt entered and exited the pool via stair and handrails independently   Therapeutic Exercise: Walking forward/backwards/side stepping x 3 laps each Marching walk x3 laps with yellow dumbbells Side stepping with shoulder  ab/adduction colorful dumbbells x3 laps Runners stretch on bottom step x30" BIL Hamstring stretch on bottom step x30" BIL Hip flexor stretch - 45'' Figure 4 squat stretch, BIL UE support x30" BIL At edge of pool, pt performed LE exercise: Hip abd/add x20 BIL Noodle stop - yellow - 20x ea Hip ext/flex with knee straight x 20 BIL Hip Circles CC/CCW x10 each BIL Marching hip flexion to knee extension 2x10 BIL Hamstring curl x20 BIL Squats 2x20 Sitting on bench  in water: Bicycle kicks x1' Flutter kicks x1' Scissor kicks x1'  Pt requires the buoyancy of water for active assisted exercises with buoyancy supported for strengthening and AROM exercises. Hydrostatic pressure also supports joints by unweighting joint load by at least 50 % in 3-4 feet depth water. 80% in chest to neck deep water. Water will provide assistance with movement using the current and laminar flow while the buoyancy reduces weight bearing. Pt requires the viscosity of the water for resistance with strengthening exercises.  [facility] Adult PT Treatment:                                                DATE: 03/06/22 Therapeutic Exercise: Curl ups 15x PPT 3s hold 15x Heel slides with PPT 15/15 Bridge w/ball 15x Bridge against RTB 15x Supine hip fallouts RTB 15x Prone lie 12s followed by PA mobs to L5-1 grade II, 10x each segment Nustep L3 8 min SLR w/PPT 15/15 1/2 roll  Standing at wall  opposite shoulder flexion/hip extension 5/5  [facility] Adult PT Treatment:                                                DATE: 03/02/2022 Aquatic therapy at [address] - therapeutic pool temp 92 degrees Pt enters building ambulating with SPC. Treatment took place in water 3.8 to  4 ft 8 in.feet deep depending upon activity.  Pt entered and exited the pool via stair and handrails independently  Pt pain level 0/10 at initiation of water walking.  Therapeutic Exercise: Walking forward/backwards/side stepping x 2 laps each Marching walk x2 laps with yellow dumbbells Side stepping with shoulder ab/adduction colorful dumbbells x2 laps Runners stretch on bottom step x30" BIL Hamstring stretch on bottom step x30" BIL Figure 4 squat stretch, BIL UE support x30" BIL Warrior I with blue square noodle rows x20 BIL At edge of pool, pt performed LE exercise: Hip abd/add x20 BIL Hip ext/flex with knee straight x 20 BIL Hip Circles CC/CCW x10 each BIL Marching hip flexion to knee extension 2x10  BIL Hamstring curl x20 BIL Squats 2x20 Sitting on bench in water: Bicycle kicks x1' Flutter kicks x1' Scissor kicks x1'  Pt requires the buoyancy of water for active assisted exercises with buoyancy supported for strengthening and AROM exercises. Hydrostatic pressure also supports joints by unweighting joint load by at least 50 % in 3-4 feet depth water. 80% in chest to neck deep water. Water will provide assistance with movement using the current and laminar flow while the buoyancy reduces weight bearing. Pt requires the viscosity of the water for resistance with strengthening exercises.      PATIENT EDUCATION:  Education details: Discussed eval findings, rehab rationale and POC and  patient is in agreement  Person educated: Patient Education method: Explanation Education comprehension: verbalized understanding and needs further education     HOME EXERCISE PROGRAM: Access Code: [email or web address] URL: [email or web address] Date: 02/27/2022 Prepared by: Sharlynn Oliphant  Exercises - Hip Flexor Stretch at [facility] of Bed  - 2 x daily - 5 x weekly - 1 sets - 3 reps - 30s hold - Supine Posterior Pelvic Tilt  - 2 x daily - 5 x weekly - 1 sets - 15 reps - Supine Bridge  - 2 x daily - 5 x weekly - 1 sets - 15 reps - Sidelying Open Book Thoracic Lumbar Rotation and Extension  - 2 x daily - 5 x weekly - 1 sets - 10 reps   ASSESSMENT:   CLINICAL IMPRESSION: Session today focused on core and hip strength as well as balance in the aquatic environment for use of buoyancy to offload joints and the viscosity of water as resistance during therapeutic exercise.  Patient was able to tolerate all prescribed exercises in the aquatic environment with no adverse effects and reports 0/10 pain at the end of the session.  We were able to advance to use of yellow dumbells during standing exercises instead of edge of pool for reduced support; [place] tolerates this well. Patient continues to benefit from skilled  PT services on land and aquatic based and should be progressed as able to improve functional independence.     OBJECTIVE IMPAIRMENTS Abnormal gait, decreased activity tolerance, decreased balance, decreased endurance, decreased knowledge of condition, decreased mobility, difficulty walking, decreased ROM, increased muscle spasms, improper body mechanics, postural dysfunction, and pain.    ACTIVITY LIMITATIONS carrying, lifting, standing, and bed mobility   PERSONAL FACTORS Age, Past/current experiences, Time since onset of injury/illness/exacerbation, and 1 comorbidity: osteomyelitis  are also affecting patient's functional outcome.    REHAB POTENTIAL: Good   CLINICAL DECISION MAKING: Evolving/moderate complexity   EVALUATION COMPLEXITY: Moderate     GOALS: Goals reviewed with patient? Yes   SHORT TERM GOALS: Target date: 03/13/2022   Patient to demonstrate independence in HEP  Baseline:[id number] Goal status: Met   2.  Increase B hip extension to -10d from neutral Baseline: -20d from neutral; 03/13/22 -10d from neutral observed during treatment. Goal status: Met   3.  Patient to perform 10 curl ups Baseline: 1 performed at eval; 03/13/22 10-15 reps Goal status: Met       LONG TERM GOALS: Target date: 04/03/2022   0d hip extension  Baseline: -20d from neutral Goal status: INITIAL   2.  Decrease 5x STS time to <15s Baseline: 16s arms crossed Goal status: INITIAL   3.  Increase core strength to 3+/5 Baseline: 3/5 as evidenced by inability to perform curl up Goal status: INITIAL   4.  Increase FOTO score to 64 Baseline: 55 Goal status: INITIAL         PLAN: PT FREQUENCY: 2x/week   PT DURATION: 6 weeks   PLANNED INTERVENTIONS: Therapeutic exercises, Therapeutic activity, Neuromuscular re-education, Balance training, Gait training, Patient/Family education, Self Care, Joint mobilization, Stair training, DME instructions, Aquatic Therapy, Dry Needling, Spinal  mobilization, Manual therapy, and Re-evaluation.   PLAN FOR NEXT SESSION: HEP review and update, core strength, hip stretch, body mechanics training    Mathis Dad, PT 03/15/2022, 2:43 PM

## 2022-03-17 ENCOUNTER — Other Ambulatory Visit: Payer: Self-pay | Admitting: Pulmonary Disease

## 2022-03-20 ENCOUNTER — Ambulatory Visit: Payer: Medicare Other

## 2022-03-20 DIAGNOSIS — R5381 Other malaise: Secondary | ICD-10-CM

## 2022-03-20 DIAGNOSIS — M791 Myalgia, unspecified site: Secondary | ICD-10-CM

## 2022-03-20 DIAGNOSIS — M5459 Other low back pain: Secondary | ICD-10-CM | POA: Diagnosis not present

## 2022-03-20 NOTE — Therapy (Signed)
OUTPATIENT PHYSICAL THERAPY TREATMENT NOTE   Patient Name: Haley Singh MRN: [id number] DOB:March 21, 1951, 71 y.o., female Today's Date: 03/20/2022  PCP: Katherina Mires, MD REFERRING PROVIDER: Gertie Gowda, DO  END OF SESSION:   PT End of Session - 03/20/22 1401     Visit Number 9    Number of Visits 12    Date for PT Re-Evaluation 04/17/22    Authorization Type [employer]    PT Start Time 1400    PT Stop Time 1440    PT Time Calculation (min) 40 min    Activity Tolerance Patient tolerated treatment well    Behavior During Therapy WFL for tasks assessed/performed                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   Hyperlipidemia    Hypertension    followed by pcp  (11-10-2019 pt stated never had a stress test)   Insomnia    MDD (major depressive disorder)    Mild persistent asthma    followed by pcp and dr Bernita Raisin (pulmonologist)   RLS (restless legs syndrome)    Vertebral osteomyelitis ([place]) 02/21/2021   Past Surgical History:  Procedure Laterality Date   BUBBLE STUDY  01/25/2021   Procedure: BUBBLE STUDY;  Surgeon: Buford Dresser, MD;  Location: [facility];  Service: Cardiovascular;;   CATARACT EXTRACTION W/ INTRAOCULAR LENS  IMPLANT, BILATERAL  2009; 2010   CYSTOSCOPY/URETEROSCOPY/HOLMIUM LASER/STENT PLACEMENT Right 08/26/2019   Procedure: CYSTOSCOPY/RETROGRADE/URETEROSCOPY/HOLMIUM LASER/STENT PLACEMENT;  Surgeon: Ceasar Mons, MD;  Location: [facility];  Service: Urology;  Laterality: Right;   CYSTOSCOPY/URETEROSCOPY/HOLMIUM LASER/STENT PLACEMENT Left 11/17/2019   Procedure: CYSTOSCOPY/URETEROSCOPY, RETROGRADE,LASER LITHOTRIPSY, STENT PLACEMENT;  Surgeon: Ceasar Mons, MD;  Location: [facility];  Service: Urology;  Laterality: Left;   CYSTOSCOPY/URETEROSCOPY/HOLMIUM LASER/STENT PLACEMENT Left 02/19/2021   Procedure: CYSTOSCOPY/URETEROSCOPY/HOLMIUM LASER/STENT PLACEMENT;  Surgeon: Lucas Mallow, MD;  Location: [facility];  Service: Urology;  Laterality: Left;   EXPLORATORY LAPAROTOMY W/ BOWEL RESECTION  01-15-2004  @[facility]    small bowel resection for stricture/ benzoar   EXTRACORPOREAL SHOCK WAVE LITHOTRIPSY Right 03/27/2018   Procedure: EXTRACORPOREAL SHOCK WAVE LITHOTRIPSY (ESWL);  Surgeon: Ardis Hughs, MD;  Location: [facility];  Service: Urology;  Laterality: Right;   EXTRACORPOREAL SHOCK WAVE LITHOTRIPSY  x2 2008;  2009;  2015   HOLMIUM LASER APPLICATION Left 10/29/7822   Procedure: HOLMIUM LASER APPLICATION;  Surgeon: Ceasar Mons, MD;  Location: [facility];  Service: Urology;  Laterality: Left;   IR FLUORO GUIDED NEEDLE PLC ASPIRATION/INJECTION LOC  02/22/2021   ORIF ANKLE FRACTURE Right 09/ 2008 @[facility]    per pt has retained hardware   TEE WITHOUT CARDIOVERSION N/A 01/25/2021   Procedure: TRANSESOPHAGEAL ECHOCARDIOGRAM (TEE);  Surgeon: Buford Dresser, MD;  Location: [facility]OSCOPY;  Service: Cardiovascular;  Laterality: N/A;   TONSILLECTOMY  age 56   Patient Active Problem List   Diagnosis Date Noted   Fatigue due to excessive exertion 01/31/2022   Weight gain 01/31/2022   Penicillin allergy    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   Vertebral osteomyelitis ([place]) 02/21/2021   Intractable nausea and vomiting 02/18/2021   Hypokalemia 02/18/2021  MSSA bacteremia    Discitis of lumbar region    Osteomyelitis ([place]) 01/17/2021   Mild persistent asthma without complication 94/17/4081   OSA (obstructive sleep apnea) 03/03/2014    REFERRING DIAG: K48.3XXA (ICD-10-CM) - Fatigue due to excessive exertion, initial encounter M54.89 (ICD-10-CM) - Midline back pain, unspecified back location,  unspecified chronicity   THERAPY DIAG:  Other low back pain  Myalgia, unspecified site  Physical deconditioning  Rationale for Evaluation and Treatment Rehabilitation  PERTINENT HISTORY: Haley Singh is a 71 y/o female presenting to PM&R clinic as a referral from her PCP Dr. Reed Breech for pain s/p osteomyelitis . She states when they made the referral, she was in pain but that is no longer the case. She has persistent fatigue, which is the most bothersome thing for her. She also has occassional muscle spasms, but these self resolved.    Fatigue: Has been ongoing and debilitating over the past year. She has not gone to PT, had any medications, or other treatments specifically for fatigue. Does endorse family Hx hypothyroidism, but no personal Hx. Has had some mild weight gain with inactivity. She also endorses poor tolerance of hot weather, but denies any fatigue or pain associated with it. She denies any associated weakness, sensory changes, pain, diplopia or pruritis.    Pain: Her osteomyelitis was at L2-3, and she also has a bulging disc at L2. She was taking oxycodone at the time 2-3x daily in acute phase, but has been off of it since January 2023. She has been getting massages every few weeks to stretch her Psoas, which is helping with her posture.    Function/social: Lives in a 3 story town home by [employer]. Used to work part time at [employer], currently unemployed. She used to do woodworking and photography, but does not anymore because heat tends to make her fatigue worse. She used to exercise in her complex pool, but has also stopped doing that due to heat. She is independent of mobility and ADLs.    She is on venlafaxane for anger and it is very effect.  PRECAUTIONS: Other: osteomyelitis  SUBJECTIVE:  Cites some stiffness in quads and hamstrings as she spent several hours pulling up tomato plants and cages.  Still feels she is using her cane less for mobility  PAIN:  Are you having  pain? No   OBJECTIVE: (objective measures completed at initial evaluation unless otherwise dated)   DIAGNOSTIC FINDINGS:  EXAM: MRI LUMBAR SPINE WITHOUT CONTRAST   TECHNIQUE: Multiplanar, multisequence MR imaging of the lumbar spine was performed. No intravenous contrast was administered.   COMPARISON:  MR lumbar 04/09/2021 and 02/20/2021; CT lumbar 01/17/2021; X-ray lumbar 05/23/2011.   FINDINGS: Despite efforts by the technologist and patient, mild motion artifact is present on today's exam and could not be eliminated. This reduces exam sensitivity and specificity.   Segmentation: Conventional anatomy assumed, with the last open disc space designated L5-S1.Concordant with previous imaging.   Alignment: Stable chronic degenerative grade 1 anterolisthesis at L4-5 and mild convex right scoliosis. Mild retrolisthesis at L1-2 is unchanged.   Vertebrae: Again demonstrated are chronic sequela of discitis and osteomyelitis at L2-3 with partial collapse of the L2 and L3 vertebral bodies and irregularity of the intervening disc space. The associated marrow edema has improved, and no progressive bone destruction identified. Stable chronic endplate degenerative changes at L1-2. No new foci of osteomyelitis identified. Stable mild degenerative changes of the sacroiliac joints.   Conus medullaris: Extends to the L1-2 level and  appears normal. Stable sacral Tarlov cysts.   Paraspinal and other soft tissues: Improved paraspinal inflammatory changes at L2-3. No focal paraspinal or epidural fluid collection.   Disc levels:   No significant disc space findings from T9-10 through T12-L1 aside from anterior osteophytes and scattered perineural cysts.   L1-2: Stable chronic degenerative disc disease with loss of disc height, annular disc bulging and endplate osteophytes. There is moderate facet and ligamentous hypertrophy contributing to stable mild spinal stenosis and mild narrowing  of the lateral recesses and foramina bilaterally.   L2-3: As above, improving changes of diskitis/osteomyelitis. Stable annular disc bulging, endplate osteophytes, facet and ligamentous hypertrophy contributing to mild-to-moderate spinal stenosis. There is stable lateral recess and foraminal narrowing bilaterally. No epidural fluid collection identified.   L3-4: Stable annular disc bulging, facet and ligamentous hypertrophy contributing to mild spinal stenosis.   L4-5: Chronic loss of disc height with annular disc bulging and advanced facet hypertrophy, accounting for the grade 1 anterolisthesis. Stable mild foraminal narrowing bilaterally.   L5-S1: Stable chronic degenerative disc disease with posterior osteophytes and mild facet hypertrophy. No spinal stenosis or nerve root encroachment.   IMPRESSION: 1. Further improvement in the bone marrow edema and paraspinal inflammatory changes associated with previously demonstrated L2-3 discitis and osteomyelitis. No progressive vertebral body collapse or recurrent fluid collection identified. 2. No new findings. 3. Stable multilevel spondylosis as detailed above.     Electronically Signed   By: Richardean Sale M.D.   On: 07/04/2021 10:51   PATIENT SURVEYS:  FOTO 55(64 predicted); 03/13/22 45   SCREENING FOR RED FLAGS: negative   COGNITION:           Overall cognitive status: Within functional limits for tasks assessed                          SENSATION: Not tested   MUSCLE LENGTH: Hamstrings: Right 90 deg; Left 90 deg Thomas test: -20d from neutral B   POSTURE:  patient demos a forward flexed posture   PALPATION: Not tested   LUMBAR ROM:    Active  A/PROM  eval  Flexion WNL  Extension -25% from neutral  Right lateral flexion    Left lateral flexion    Right rotation    Left rotation     (Blank rows = not tested)   LOWER EXTREMITY ROM:      Active  Right eval Left eval  Hip flexion -20d -20d  Hip  extension      Hip abduction      Hip adduction      Hip internal rotation      Hip external rotation      Knee flexion      Knee extension      Ankle dorsiflexion      Ankle plantarflexion      Ankle inversion      Ankle eversion       (Blank rows = not tested)   LOWER EXTREMITY MMT:     MMT Right eval Left eval  Hip flexion      Hip extension      Hip abduction      Hip adduction      Hip internal rotation      Hip external rotation      Knee flexion      Knee extension      Ankle dorsiflexion      Ankle plantarflexion  Ankle inversion      Ankle eversion      Core/trunk flexion 3 3   (Blank rows = not tested)   LUMBAR SPECIAL TESTS:  Straight leg raise test: Negative, Slump test: Negative, Single leg stance test: Negative, FABER test: Positive, and Thomas test: Positive   FUNCTIONAL TESTS:  5 times sit to stand: 16s with arms crossed   GAIT: Distance walked: 37f x2 Assistive device utilized: None Level of assistance: Complete Independence Comments: antalgic        TODAY'S TREATMENT  [facility] Adult PT Treatment:                                                DATE: 03/20/22 Therapeutic Exercise: Curl ups 15x Bridge w/ball 15x Supine alternating march 15/15 Prone lie PA mobs to L5-1 grade II, 10x each segment Manual ER stretch in prone 30s x2 B Nustep L4 8 min SLR 15/15  Standing at wall  opposite shoulder flexion/hip extension 7/7 Prone quad stretch 30s x2 B 15 PKBs in b/t stretches  [facility] Adult PT Treatment:                                                DATE: 03/15/2022 Aquatic therapy at [address] - therapeutic pool temp 92 degrees Pt enters building ambulating with SPC. Treatment took place in water 3.8 to  4 ft 8 in.feet deep depending upon activity.  Pt entered and exited the pool via stair and handrails independently   Therapeutic Exercise: Walking forward/backwards/side stepping x 3 laps each Marching walk x3 laps with  yellow dumbbells Side stepping with shoulder ab/adduction colorful dumbbells x3 laps Runners stretch on bottom step x30" BIL Hamstring stretch on bottom step x30" BIL Hip flexor stretch - 45'' Figure 4 squat stretch, BIL UE support x30" BIL Riding yellow pool noodle and breast stroke across pool - 2 laps At with yellow DB pt performed LE exercise: Hip abd/add x20 BIL Noodle stomp - yellow - 2x20 ea Hip ext/flex with knee straight x 20 BIL Hip Circles CC/CCW x20 each BIL  Pt requires the buoyancy of water for active assisted exercises with buoyancy supported for strengthening and AROM exercises. Hydrostatic pressure also supports joints by unweighting joint load by at least 50 % in 3-4 feet depth water. 80% in chest to neck deep water. Water will provide assistance with movement using the current and laminar flow while the buoyancy reduces weight bearing. Pt requires the viscosity of the water for resistance with strengthening exercises.  [facility]:                                                DATE: 03/13/22 Therapeutic Exercise: Curl ups 15x PPT 3s hold 15x Heel slides with PPT 15/15 Bridge w/ball 2000g 15x Bridge against GTB 15x Supine hip fallouts GTB 15x Prone lie 120s followed by PA mobs to L5-1 grade II, 10x each segment Nustep L4 8 min SLR w/PPT 15/15 1/2 roll  Standing at wall  opposite shoulder flexion/hip extension 7/7   [facility] Adult PT  Treatment:                                                DATE: 03/08/2022 Aquatic therapy at [address] - therapeutic pool temp 92 degrees Pt enters building ambulating with SPC. Treatment took place in water 3.8 to  4 ft 8 in.feet deep depending upon activity.  Pt entered and exited the pool via stair and handrails independently   Therapeutic Exercise: Walking forward/backwards/side stepping x 3 laps each Marching walk x3 laps with yellow dumbbells Side stepping with shoulder ab/adduction colorful  dumbbells x3 laps Runners stretch on bottom step x30" BIL Hamstring stretch on bottom step x30" BIL Hip flexor stretch - 45'' Figure 4 squat stretch, BIL UE support x30" BIL At edge of pool, pt performed LE exercise: Hip abd/add x20 BIL Noodle stop - yellow - 20x ea Hip ext/flex with knee straight x 20 BIL Hip Circles CC/CCW x10 each BIL Marching hip flexion to knee extension 2x10 BIL Hamstring curl x20 BIL Squats 2x20 Sitting on bench in water: Bicycle kicks x1' Flutter kicks x1' Scissor kicks x1'  Pt requires the buoyancy of water for active assisted exercises with buoyancy supported for strengthening and AROM exercises. Hydrostatic pressure also supports joints by unweighting joint load by at least 50 % in 3-4 feet depth water. 80% in chest to neck deep water. Water will provide assistance with movement using the current and laminar flow while the buoyancy reduces weight bearing. Pt requires the viscosity of the water for resistance with strengthening exercises.  [facility] Adult PT Treatment:                                                DATE: 03/06/22 Therapeutic Exercise: Curl ups 15x PPT 3s hold 15x Heel slides with PPT 15/15 Bridge w/ball 15x Bridge against RTB 15x Supine hip fallouts RTB 15x Prone lie 12s followed by PA mobs to L5-1 grade II, 10x each segment Nustep L3 8 min SLR w/PPT 15/15 1/2 roll  Standing at wall  opposite shoulder flexion/hip extension 5/5  [facility] Adult PT Treatment:                                                DATE: 03/02/2022 Aquatic therapy at [address] - therapeutic pool temp 92 degrees Pt enters building ambulating with SPC. Treatment took place in water 3.8 to  4 ft 8 in.feet deep depending upon activity.  Pt entered and exited the pool via stair and handrails independently  Pt pain level 0/10 at initiation of water walking.  Therapeutic Exercise: Walking forward/backwards/side stepping x 2 laps each Marching walk x2 laps with  yellow dumbbells Side stepping with shoulder ab/adduction colorful dumbbells x2 laps Runners stretch on bottom step x30" BIL Hamstring stretch on bottom step x30" BIL Figure 4 squat stretch, BIL UE support x30" BIL Warrior I with blue square noodle rows x20 BIL At edge of pool, pt performed LE exercise: Hip abd/add x20 BIL Hip ext/flex with knee straight x 20 BIL Hip Circles CC/CCW x10 each BIL Marching hip  flexion to knee extension 2x10 BIL Hamstring curl x20 BIL Squats 2x20 Sitting on bench in water: Bicycle kicks x1' Flutter kicks x1' Scissor kicks x1'  Pt requires the buoyancy of water for active assisted exercises with buoyancy supported for strengthening and AROM exercises. Hydrostatic pressure also supports joints by unweighting joint load by at least 50 % in 3-4 feet depth water. 80% in chest to neck deep water. Water will provide assistance with movement using the current and laminar flow while the buoyancy reduces weight bearing. Pt requires the viscosity of the water for resistance with strengthening exercises.      PATIENT EDUCATION:  Education details: Discussed eval findings, rehab rationale and POC and patient is in agreement  Person educated: Patient Education method: Explanation Education comprehension: verbalized understanding and needs further education     HOME EXERCISE PROGRAM: Access Code: [email or web address] URL: [email or web address] Date: 02/27/2022 Prepared by: Sharlynn Oliphant  Exercises - Hip Flexor Stretch at [facility] of Bed  - 2 x daily - 5 x weekly - 1 sets - 3 reps - 30s hold - Supine Posterior Pelvic Tilt  - 2 x daily - 5 x weekly - 1 sets - 15 reps - Supine Bridge  - 2 x daily - 5 x weekly - 1 sets - 15 reps - Sidelying Open Book Thoracic Lumbar Rotation and Extension  - 2 x daily - 5 x weekly - 1 sets - 10 reps   ASSESSMENT:   CLINICAL IMPRESSION: Today's session incorporated additional prone tasks including PKBs, manual stretching or quads  and ERs from prone position.  Able to lay LE's flat with SLR indicating improved flexibility.    OBJECTIVE IMPAIRMENTS Abnormal gait, decreased activity tolerance, decreased balance, decreased endurance, decreased knowledge of condition, decreased mobility, difficulty walking, decreased ROM, increased muscle spasms, improper body mechanics, postural dysfunction, and pain.    ACTIVITY LIMITATIONS carrying, lifting, standing, and bed mobility   PERSONAL FACTORS Age, Past/current experiences, Time since onset of injury/illness/exacerbation, and 1 comorbidity: osteomyelitis  are also affecting patient's functional outcome.    REHAB POTENTIAL: Good   CLINICAL DECISION MAKING: Evolving/moderate complexity   EVALUATION COMPLEXITY: Moderate     GOALS: Goals reviewed with patient? Yes   SHORT TERM GOALS: Target date: 03/13/2022   Patient to demonstrate independence in HEP  Baseline:[id number] Goal status: Met   2.  Increase B hip extension to -10d from neutral Baseline: -20d from neutral; 03/13/22 -10d from neutral observed during treatment. Goal status: Met   3.  Patient to perform 10 curl ups Baseline: 1 performed at eval; 03/13/22 10-15 reps Goal status: Met       LONG TERM GOALS: Target date: 04/03/2022   0d hip extension  Baseline: -20d from neutral Goal status: INITIAL   2.  Decrease 5x STS time to <15s Baseline: 16s arms crossed Goal status: INITIAL   3.  Increase core strength to 3+/5 Baseline: 3/5 as evidenced by inability to perform curl up Goal status: INITIAL   4.  Increase FOTO score to 64 Baseline: 55; 10/10 45 Goal status: Ongoing         PLAN: PT FREQUENCY: 2x/week   PT DURATION: 6 weeks   PLANNED INTERVENTIONS: Therapeutic exercises, Therapeutic activity, Neuromuscular re-education, Balance training, Gait training, Patient/Family education, Self Care, Joint mobilization, Stair training, DME instructions, Aquatic Therapy, Dry Needling, Spinal  mobilization, Manual therapy, and Re-evaluation.   PLAN FOR NEXT SESSION: HEP review and update, core strength, hip stretch, body mechanics training, review benefit of tsrtching  Lanice Shirts, PT 03/20/2022, 2:46 PM

## 2022-03-22 ENCOUNTER — Encounter: Payer: Self-pay | Admitting: Physical Therapy

## 2022-03-22 ENCOUNTER — Ambulatory Visit: Payer: Medicare Other | Admitting: Physical Therapy

## 2022-03-22 DIAGNOSIS — R5381 Other malaise: Secondary | ICD-10-CM

## 2022-03-22 DIAGNOSIS — M791 Myalgia, unspecified site: Secondary | ICD-10-CM

## 2022-03-22 DIAGNOSIS — M868X8 Other osteomyelitis, other site: Secondary | ICD-10-CM

## 2022-03-22 DIAGNOSIS — M5459 Other low back pain: Secondary | ICD-10-CM

## 2022-03-22 NOTE — Therapy (Signed)
OUTPATIENT PHYSICAL THERAPY TREATMENT NOTE   Patient Name: Haley Singh MRN: [id number] DOB:[birth year], 71 y.o., female Today's Date: 03/22/2022  PCP: Katherina Mires, MD REFERRING PROVIDER: Gertie Gowda, DO  END OF SESSION:      PT End of Session - 03/22/22 1350     Visit Number 10   discussed progress note with evaluating therapist; will complete PN at next land visit   Number of Visits 12    Date for PT Re-Evaluation 04/17/22    Authorization Type [employer]    PT Start Time 1355    PT Stop Time 1440    PT Time Calculation (min) 45 min    Activity Tolerance Patient tolerated treatment well    Behavior During Therapy [facility] for tasks assessed/performed                 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   Hyperlipidemia    Hypertension    followed by pcp  (11-10-2019 pt stated never had a stress test)   Insomnia    MDD (major depressive disorder)    Mild persistent asthma    followed by pcp and dr Bernita Raisin (pulmonologist)   RLS (restless legs syndrome)    Vertebral osteomyelitis ([place]) 02/21/2021   Past Surgical History:  Procedure Laterality Date   BUBBLE STUDY  01/25/2021   Procedure: BUBBLE STUDY;  Surgeon: Buford Dresser, MD;  Location: [place];  Service: Cardiovascular;;   CATARACT EXTRACTION W/ INTRAOCULAR LENS  IMPLANT, BILATERAL  2009; 2010   CYSTOSCOPY/URETEROSCOPY/HOLMIUM LASER/STENT PLACEMENT Right 08/26/2019   Procedure: CYSTOSCOPY/RETROGRADE/URETEROSCOPY/HOLMIUM LASER/STENT PLACEMENT;  Surgeon: Ceasar Mons, MD;  Location: [facility];  Service: Urology;  Laterality: Right;   CYSTOSCOPY/URETEROSCOPY/HOLMIUM LASER/STENT PLACEMENT Left 11/17/2019    Procedure: CYSTOSCOPY/URETEROSCOPY, RETROGRADE,LASER LITHOTRIPSY, STENT PLACEMENT;  Surgeon: Ceasar Mons, MD;  Location: [facility];  Service: Urology;  Laterality: Left;   CYSTOSCOPY/URETEROSCOPY/HOLMIUM LASER/STENT PLACEMENT Left 02/19/2021   Procedure: CYSTOSCOPY/URETEROSCOPY/HOLMIUM LASER/STENT PLACEMENT;  Surgeon: Lucas Mallow, MD;  Location: [facility];  Service: Urology;  Laterality: Left;   EXPLORATORY LAPAROTOMY W/ BOWEL RESECTION  01-15-2004  @[facility]    small bowel resection for stricture/ benzoar   EXTRACORPOREAL SHOCK WAVE LITHOTRIPSY Right 03/27/2018   Procedure: EXTRACORPOREAL SHOCK WAVE LITHOTRIPSY (ESWL);  Surgeon: Ardis Hughs, MD;  Location: [facility];  Service: Urology;  Laterality: Right;   EXTRACORPOREAL SHOCK WAVE LITHOTRIPSY  x2 2008;  2009;  2015   HOLMIUM LASER APPLICATION Left 07/06/3341   Procedure: HOLMIUM LASER APPLICATION;  Surgeon: Ceasar Mons, MD;  Location: [facility];  Service: Urology;  Laterality: Left;   IR FLUORO GUIDED NEEDLE PLC ASPIRATION/INJECTION LOC  02/22/2021   ORIF ANKLE FRACTURE Right 09/ 2008 @[facility]    per pt has retained hardware   TEE WITHOUT CARDIOVERSION N/A 01/25/2021   Procedure: TRANSESOPHAGEAL ECHOCARDIOGRAM (TEE);  Surgeon: Buford Dresser, MD;  Location: [facility];  Service: Cardiovascular;  Laterality: N/A;   TONSILLECTOMY  age 69   Patient Active Problem List   Diagnosis Date Noted   Fatigue due to excessive exertion 01/31/2022   Weight gain 01/31/2022   Penicillin allergy    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  Vertebral osteomyelitis ([place]) 02/21/2021   Intractable nausea and vomiting 02/18/2021   Hypokalemia 02/18/2021   MSSA bacteremia    Discitis of lumbar region    Osteomyelitis ([place]) 01/17/2021   Mild persistent asthma without complication 16/96/7893   OSA (obstructive sleep apnea) 03/03/2014    REFERRING DIAG: Y10.3XXA (ICD-10-CM) - Fatigue due to excessive  exertion, initial encounter M54.89 (ICD-10-CM) - Midline back pain, unspecified back location, unspecified chronicity   THERAPY DIAG:  Other low back pain  Myalgia, unspecified site  Physical deconditioning  Other osteomyelitis, other site [facility])  Rationale for Evaluation and Treatment Rehabilitation  PERTINENT HISTORY: Ms. Bader is a 71 y/o female presenting to PM&R clinic as a referral from her PCP Dr. Reed Breech for pain s/p osteomyelitis . She states when they made the referral, she was in pain but that is no longer the case. She has persistent fatigue, which is the most bothersome thing for her. She also has occassional muscle spasms, but these self resolved.    Fatigue: Has been ongoing and debilitating over the past year. She has not gone to PT, had any medications, or other treatments specifically for fatigue. Does endorse family Hx hypothyroidism, but no personal Hx. Has had some mild weight gain with inactivity. She also endorses poor tolerance of hot weather, but denies any fatigue or pain associated with it. She denies any associated weakness, sensory changes, pain, diplopia or pruritis.    Pain: Her osteomyelitis was at L2-3, and she also has a bulging disc at L2. She was taking oxycodone at the time 2-3x daily in acute phase, but has been off of it since January 2023. She has been getting massages every few weeks to stretch her Psoas, which is helping with her posture.    Function/social: Lives in a 3 story town home by [employer]. Used to work part time at [employer], currently unemployed. She used to do woodworking and photography, but does not anymore because heat tends to make her fatigue worse. She used to exercise in her complex pool, but has also stopped doing that due to heat. She is independent of mobility and ADLs.    She is on venlafaxane for anger and it is very effect.  PRECAUTIONS: Other: osteomyelitis  SUBJECTIVE:  Pt reports continued, but slowly improving, fatigue  and no pain.  PAIN:  Are you having pain? No   OBJECTIVE: (objective measures completed at initial evaluation unless otherwise dated)   DIAGNOSTIC FINDINGS:  EXAM: MRI LUMBAR SPINE WITHOUT CONTRAST   TECHNIQUE: Multiplanar, multisequence MR imaging of the lumbar spine was performed. No intravenous contrast was administered.   COMPARISON:  MR lumbar 04/09/2021 and 02/20/2021; CT lumbar 01/17/2021; X-ray lumbar 05/23/2011.   FINDINGS: Despite efforts by the technologist and patient, mild motion artifact is present on today's exam and could not be eliminated. This reduces exam sensitivity and specificity.   Segmentation: Conventional anatomy assumed, with the last open disc space designated L5-S1.Concordant with previous imaging.   Alignment: Stable chronic degenerative grade 1 anterolisthesis at L4-5 and mild convex right scoliosis. Mild retrolisthesis at L1-2 is unchanged.   Vertebrae: Again demonstrated are chronic sequela of discitis and osteomyelitis at L2-3 with partial collapse of the L2 and L3 vertebral bodies and irregularity of the intervening disc space. The associated marrow edema has improved, and no progressive bone destruction identified. Stable chronic endplate degenerative changes at L1-2. No new foci of osteomyelitis identified. Stable mild degenerative changes of the sacroiliac joints.   Conus medullaris: Extends to  the L1-2 level and appears normal. Stable sacral Tarlov cysts.   Paraspinal and other soft tissues: Improved paraspinal inflammatory changes at L2-3. No focal paraspinal or epidural fluid collection.   Disc levels:   No significant disc space findings from T9-10 through T12-L1 aside from anterior osteophytes and scattered perineural cysts.   L1-2: Stable chronic degenerative disc disease with loss of disc height, annular disc bulging and endplate osteophytes. There is moderate facet and ligamentous hypertrophy contributing to  stable mild spinal stenosis and mild narrowing of the lateral recesses and foramina bilaterally.   L2-3: As above, improving changes of diskitis/osteomyelitis. Stable annular disc bulging, endplate osteophytes, facet and ligamentous hypertrophy contributing to mild-to-moderate spinal stenosis. There is stable lateral recess and foraminal narrowing bilaterally. No epidural fluid collection identified.   L3-4: Stable annular disc bulging, facet and ligamentous hypertrophy contributing to mild spinal stenosis.   L4-5: Chronic loss of disc height with annular disc bulging and advanced facet hypertrophy, accounting for the grade 1 anterolisthesis. Stable mild foraminal narrowing bilaterally.   L5-S1: Stable chronic degenerative disc disease with posterior osteophytes and mild facet hypertrophy. No spinal stenosis or nerve root encroachment.   IMPRESSION: 1. Further improvement in the bone marrow edema and paraspinal inflammatory changes associated with previously demonstrated L2-3 discitis and osteomyelitis. No progressive vertebral body collapse or recurrent fluid collection identified. 2. No new findings. 3. Stable multilevel spondylosis as detailed above.     Electronically Signed   By: Richardean Sale M.D.   On: 07/04/2021 10:51   PATIENT SURVEYS:  FOTO 55(64 predicted); 03/13/22 45   SCREENING FOR RED FLAGS: negative   COGNITION:           Overall cognitive status: Within functional limits for tasks assessed                          SENSATION: Not tested   MUSCLE LENGTH: Hamstrings: Right 90 deg; Left 90 deg Thomas test: -20d from neutral B   POSTURE:  patient demos a forward flexed posture   PALPATION: Not tested   LUMBAR ROM:    Active  A/PROM  eval  Flexion WNL  Extension -25% from neutral  Right lateral flexion    Left lateral flexion    Right rotation    Left rotation     (Blank rows = not tested)   LOWER EXTREMITY ROM:      Active   Right eval Left eval  Hip flexion -20d -20d  Hip extension      Hip abduction      Hip adduction      Hip internal rotation      Hip external rotation      Knee flexion      Knee extension      Ankle dorsiflexion      Ankle plantarflexion      Ankle inversion      Ankle eversion       (Blank rows = not tested)   LOWER EXTREMITY MMT:     MMT Right eval Left eval  Hip flexion      Hip extension      Hip abduction      Hip adduction      Hip internal rotation      Hip external rotation      Knee flexion      Knee extension      Ankle dorsiflexion      Ankle plantarflexion  Ankle inversion      Ankle eversion      Core/trunk flexion 3 3   (Blank rows = not tested)   LUMBAR SPECIAL TESTS:  Straight leg raise test: Negative, Slump test: Negative, Single leg stance test: Negative, FABER test: Positive, and Thomas test: Positive   FUNCTIONAL TESTS:  5 times sit to stand: 16s with arms crossed   GAIT: Distance walked: 59f x2 Assistive device utilized: None Level of assistance: Complete Independence Comments: antalgic        TODAY'S TREATMENT    [facility] Adult PT Treatment:                                                DATE: 03/22/2022 [address] therapeutic pool temp 92 degrees Pt enters building ambulating with SPC. Treatment took place in water 3.8 to  4 ft 8 in.feet deep depending upon activity.  Pt entered and exited the pool via stair and handrails independently   Therapeutic Exercise: Walking forward/backwards/side stepping x 3 laps each Marching walk x3 laps with rainbow dumbbells Side stepping with shoulder ab/adduction colorful dumbbells x3 laps Runners stretch on bottom step x30" BIL Hamstring stretch on bottom step x30" BIL Hip flexor stretch - 45'' Figure 4 squat stretch, BIL UE support x30" BIL Riding yellow pool noodle and breast stroke across pool - 2 laps Sit to stand - 3x10 - step 4 At with yellow  DB pt performed LE exercise: Hip abd/add 1x20 BIL Noodle stomp - yellow - 2x20 ea (NT) Hip ext/flex with knee straight 1x 20 BIL Squat 1x20 Walking lunge - 2 laps Hip Circles CC/CCW x20 each BIL  Pt requires the buoyancy of water for active assisted exercises with buoyancy supported for strengthening and AROM exercises. Hydrostatic pressure also supports joints by unweighting joint load by at least 50 % in 3-4 feet depth water. 80% in chest to neck deep water. Water will provide assistance with movement using the current and laminar flow while the buoyancy reduces weight bearing. Pt requires the viscosity of the water for resistance with strengthening exercises.  [facility]                                                [facility]: [facility] Therapeutic Exercise: Curl ups 15x Bridge w/ball 15x Supine alternating march 15/15 Prone lie PA mobs to L5-1 grade II, 10x each segment Manual ER stretch in prone 30s x2 B Nustep L4 8 min SLR 15/15  Standing at wall  opposite shoulder flexion/hip extension 7/7 Prone quad stretch 30s x2 B 15 PKBs in b/t stretches  [facility] Adult PT Treatment:                                                DATE: 03/15/2022 [address] therapeutic pool temp 92 degrees Pt enters building ambulating with SPC. Treatment took place in water 3.8 to  4 ft 8 in.feet deep depending upon activity.  Pt entered and exited the pool via stair and handrails independently  Therapeutic Exercise: Walking forward/backwards/side stepping x 3 laps each Marching walk x3 laps with yellow dumbbells Side stepping with shoulder ab/adduction colorful dumbbells x3 laps Runners stretch on bottom step x30" BIL Hamstring stretch on bottom step x30" BIL Hip flexor stretch - 45'' Figure 4 squat stretch, BIL UE support x30" BIL Riding yellow pool noodle and breast stroke across pool - 2 laps At with yellow DB pt performed LE exercise: Hip abd/add x20  BIL Noodle stomp - yellow - 2x20 ea Hip ext/flex with knee straight x 20 BIL Hip Circles CC/CCW x20 each BIL  Pt requires the buoyancy of water for active assisted exercises with buoyancy supported for strengthening and AROM exercises. Hydrostatic pressure also supports joints by unweighting joint load by at least 50 % in 3-4 feet depth water. 80% in chest to neck deep water. Water will provide assistance with movement using the current and laminar flow while the buoyancy reduces weight bearing. Pt requires the viscosity of the water for resistance with strengthening exercises.  [place] Adult PT Treatment:                                                DATE: 03/13/22 Therapeutic Exercise: Curl ups 15x PPT 3s hold 15x Heel slides with PPT 15/15 Bridge w/ball 2000g 15x Bridge against GTB 15x Supine hip fallouts GTB 15x Prone lie 120s followed by PA mobs to L5-1 grade II, 10x each segment Nustep L4 8 min SLR w/PPT 15/15 1/2 roll  Standing at wall  opposite shoulder flexion/hip extension 7/7   [facility] Adult PT Treatment:                                                DATE: 03/08/2022 Aquatic therapy at [address] - therapeutic pool temp 92 degrees Pt enters building ambulating with SPC. Treatment took place in water 3.8 to  4 ft 8 in.feet deep depending upon activity.  Pt entered and exited the pool via stair and handrails independently   Therapeutic Exercise: Walking forward/backwards/side stepping x 3 laps each Marching walk x3 laps with yellow dumbbells Side stepping with shoulder ab/adduction colorful dumbbells x3 laps Runners stretch on bottom step x30" BIL Hamstring stretch on bottom step x30" BIL Hip flexor stretch - 45'' Figure 4 squat stretch, BIL UE support x30" BIL At edge of pool, pt performed LE exercise: Hip abd/add x20 BIL Noodle stop - yellow - 20x ea Hip ext/flex with knee straight x 20 BIL Hip Circles CC/CCW x10 each BIL Marching hip flexion to knee  extension 2x10 BIL Hamstring curl x20 BIL Squats 2x20 Sitting on bench in water: Bicycle kicks x1' Flutter kicks x1' Scissor kicks x1'  Pt requires the buoyancy of water for active assisted exercises with buoyancy supported for strengthening and AROM exercises. Hydrostatic pressure also supports joints by unweighting joint load by at least 50 % in 3-4 feet depth water. 80% in chest to neck deep water. Water will provide assistance with movement using the current and laminar flow while the buoyancy reduces weight bearing. Pt requires the viscosity of the water for resistance with strengthening exercises.  [facility] Adult PT Treatment:  DATE: 03/06/22 Therapeutic Exercise: Curl ups 15x PPT 3s hold 15x Heel slides with PPT 15/15 Bridge w/ball 15x Bridge against RTB 15x Supine hip fallouts RTB 15x Prone lie 12s followed by PA mobs to L5-1 grade II, 10x each segment Nustep L3 8 min SLR w/PPT 15/15 1/2 roll  Standing at wall  opposite shoulder flexion/hip extension 5/5  [facility] Adult PT Treatment:                                                DATE: 03/02/2022 Aquatic therapy at [address] - therapeutic pool temp 92 degrees Pt enters building ambulating with SPC. Treatment took place in water 3.8 to  4 ft 8 in.feet deep depending upon activity.  Pt entered and exited the pool via stair and handrails independently  Pt pain level 0/10 at initiation of water walking.  Therapeutic Exercise: Walking forward/backwards/side stepping x 2 laps each Marching walk x2 laps with yellow dumbbells Side stepping with shoulder ab/adduction colorful dumbbells x2 laps Runners stretch on bottom step x30" BIL Hamstring stretch on bottom step x30" BIL Figure 4 squat stretch, BIL UE support x30" BIL Warrior I with blue square noodle rows x20 BIL At edge of pool, pt performed LE exercise: Hip abd/add x20 BIL Hip ext/flex with knee straight x 20 BIL Hip  Circles CC/CCW x10 each BIL Marching hip flexion to knee extension 2x10 BIL Hamstring curl x20 BIL Squats 2x20 Sitting on bench in water: Bicycle kicks x1' Flutter kicks x1' Scissor kicks x1'  Pt requires the buoyancy of water for active assisted exercises with buoyancy supported for strengthening and AROM exercises. Hydrostatic pressure also supports joints by unweighting joint load by at least 50 % in 3-4 feet depth water. 80% in chest to neck deep water. Water will provide assistance with movement using the current and laminar flow while the buoyancy reduces weight bearing. Pt requires the viscosity of the water for resistance with strengthening exercises.     HOME EXERCISE PROGRAM: Access Code: [email or web address] URL: [email or web address] Date: 02/27/2022 Prepared by: Sharlynn Oliphant  Exercises - Hip Flexor Stretch at [facility] of Bed  - 2 x daily - 5 x weekly - 1 sets - 3 reps - 30s hold - Supine Posterior Pelvic Tilt  - 2 x daily - 5 x weekly - 1 sets - 15 reps - Supine Bridge  - 2 x daily - 5 x weekly - 1 sets - 15 reps - Sidelying Open Book Thoracic Lumbar Rotation and Extension  - 2 x daily - 5 x weekly - 1 sets - 10 reps   ASSESSMENT:   CLINICAL IMPRESSION: Session today focused on building exercise capacity in the aquatic environment for use of buoyancy to offload joints and the viscosity of water as resistance during therapeutic exercise.  Patient was able to tolerate all prescribed exercises in the aquatic environment with no adverse effects and reports 0/10 pain at the end of the session. Patient continues to benefit from skilled PT services on land and aquatic based and should be progressed as able to improve functional independence.      OBJECTIVE IMPAIRMENTS Abnormal gait, decreased activity tolerance, decreased balance, decreased endurance, decreased knowledge of condition, decreased mobility, difficulty walking, decreased ROM, increased muscle spasms, improper body  mechanics, postural dysfunction, and pain.    ACTIVITY LIMITATIONS carrying, lifting, standing, and bed  mobility   PERSONAL FACTORS Age, Past/current experiences, Time since onset of injury/illness/exacerbation, and 1 comorbidity: osteomyelitis  are also affecting patient's functional outcome.    REHAB POTENTIAL: Good   CLINICAL DECISION MAKING: Evolving/moderate complexity   EVALUATION COMPLEXITY: Moderate     GOALS: Goals reviewed with patient? Yes   SHORT TERM GOALS: Target date: 03/13/2022   Patient to demonstrate independence in HEP  Baseline:[id number] Goal status: Met   2.  Increase B hip extension to -10d from neutral Baseline: -20d from neutral; 03/13/22 -10d from neutral observed during treatment. Goal status: Met   3.  Patient to perform 10 curl ups Baseline: 1 performed at eval; 03/13/22 10-15 reps Goal status: Met       LONG TERM GOALS: Target date: 04/03/2022   0d hip extension  Baseline: -20d from neutral Goal status: INITIAL   2.  Decrease 5x STS time to <15s Baseline: 16s arms crossed Goal status: INITIAL   3.  Increase core strength to 3+/5 Baseline: 3/5 as evidenced by inability to perform curl up Goal status: INITIAL   4.  Increase FOTO score to 64 Baseline: 55; 10/10 45 Goal status: Ongoing         PLAN: PT FREQUENCY: 2x/week   PT DURATION: 6 weeks   PLANNED INTERVENTIONS: Therapeutic exercises, Therapeutic activity, Neuromuscular re-education, Balance training, Gait training, Patient/Family education, Self Care, Joint mobilization, Stair training, DME instructions, Aquatic Therapy, Dry Needling, Spinal mobilization, Manual therapy, and Re-evaluation.   PLAN FOR NEXT SESSION: HEP review and update, core strength, hip stretch, body mechanics training, review benefit of tsrtching    Mathis Dad, PT 03/22/2022, 2:41 PM

## 2022-03-27 ENCOUNTER — Ambulatory Visit: Payer: Medicare Other

## 2022-03-27 DIAGNOSIS — M5459 Other low back pain: Secondary | ICD-10-CM

## 2022-03-27 DIAGNOSIS — R5381 Other malaise: Secondary | ICD-10-CM

## 2022-03-27 DIAGNOSIS — M791 Myalgia, unspecified site: Secondary | ICD-10-CM

## 2022-03-27 NOTE — Therapy (Signed)
OUTPATIENT PHYSICAL THERAPY TREATMENT NOTE/10th visit progress note   Patient Name: Haley Singh MRN: [id number] DOB:Mar 21, 1951, 71 y.o., female Today's Date: 03/27/2022  PCP: Katherina Mires, MD REFERRING PROVIDER: Gertie Gowda, DO  END OF SESSION:      PT End of Session - 03/27/22 1400     Visit Number 11    Number of Visits 12    Date for PT Re-Evaluation 04/17/22    Authorization Type [employer]    PT Start Time 1400    PT Stop Time 7673    PT Time Calculation (min) 45 min    Activity Tolerance Patient tolerated treatment well    Behavior During Therapy WFL for tasks assessed/performed                 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   Hyperlipidemia    Hypertension    followed by pcp  (11-10-2019 pt stated never had a stress test)   Insomnia    MDD (major depressive disorder)    Mild persistent asthma    followed by pcp and dr Bernita Raisin (pulmonologist)   RLS (restless legs syndrome)    Vertebral osteomyelitis ([place]) 02/21/2021   Past Surgical History:  Procedure Laterality Date   BUBBLE STUDY  01/25/2021   Procedure: BUBBLE STUDY;  Surgeon: Buford Dresser, MD;  Location: [facility];  Service: Cardiovascular;;   CATARACT EXTRACTION W/ INTRAOCULAR LENS  IMPLANT, BILATERAL  2009; 2010   CYSTOSCOPY/URETEROSCOPY/HOLMIUM LASER/STENT PLACEMENT Right 08/26/2019   Procedure: CYSTOSCOPY/RETROGRADE/URETEROSCOPY/HOLMIUM LASER/STENT PLACEMENT;  Surgeon: Ceasar Mons, MD;  Location: [facility];  Service: Urology;  Laterality: Right;   CYSTOSCOPY/URETEROSCOPY/HOLMIUM LASER/STENT PLACEMENT Left 11/17/2019   Procedure: CYSTOSCOPY/URETEROSCOPY, RETROGRADE,LASER LITHOTRIPSY,  STENT PLACEMENT;  Surgeon: Ceasar Mons, MD;  Location: [facility];  Service: Urology;  Laterality: Left;   CYSTOSCOPY/URETEROSCOPY/HOLMIUM LASER/STENT PLACEMENT Left 02/19/2021   Procedure: CYSTOSCOPY/URETEROSCOPY/HOLMIUM LASER/STENT PLACEMENT;  Surgeon: Lucas Mallow, MD;  Location: [facility];  Service: Urology;  Laterality: Left;   EXPLORATORY LAPAROTOMY W/ BOWEL RESECTION  01-15-2004  _0    small bowel resection for stricture/ benzoar   EXTRACORPOREAL SHOCK WAVE LITHOTRIPSY Right 03/27/2018   Procedure: EXTRACORPOREAL SHOCK WAVE LITHOTRIPSY (ESWL);  Surgeon: Ardis Hughs, MD;  Location: [facility];  Service: Urology;  Laterality: Right;   EXTRACORPOREAL SHOCK WAVE LITHOTRIPSY  x2 2008;  2009;  2015   HOLMIUM LASER APPLICATION Left 09/20/3788   Procedure: HOLMIUM LASER APPLICATION;  Surgeon: Ceasar Mons, MD;  Location: [facility];  Service: Urology;  Laterality: Left;   IR FLUORO GUIDED NEEDLE PLC ASPIRATION/INJECTION LOC  02/22/2021   ORIF ANKLE FRACTURE Right 09/ 2008 _1    per pt has retained hardware   TEE WITHOUT CARDIOVERSION N/A 01/25/2021   Procedure: TRANSESOPHAGEAL ECHOCARDIOGRAM (TEE);  Surgeon: Buford Dresser, MD;  Location[facility];  Service: Cardiovascular;  Laterality: N/A;   TONSILLECTOMY  age 53   Patient Active Problem List   Diagnosis Date Noted   Fatigue due to excessive exertion 01/31/2022   Weight gain 01/31/2022   Penicillin allergy    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   Vertebral osteomyelitis ([place]) 02/21/2021   Intractable nausea and  vomiting 02/18/2021   Hypokalemia 02/18/2021   MSSA bacteremia    Discitis of lumbar region    Osteomyelitis ([facility]) 01/17/2021   Mild persistent asthma without complication 05/17/2016   OSA (obstructive sleep apnea) 03/03/2014    REFERRING DIAG: T73.3XXA (ICD-10-CM) - Fatigue due to excessive exertion, initial encounter M54.89 (ICD-10-CM) - Midline back pain,  unspecified back location, unspecified chronicity   THERAPY DIAG:  Other low back pain  Myalgia, unspecified site  Physical deconditioning  Rationale for Evaluation and Treatment Rehabilitation  PERTINENT HISTORY: Haley Singh is a 71 y/o female presenting to PM&R clinic as a referral from her PCP Dr. Brescoe for pain s/p osteomyelitis . She states when they made the referral, she was in pain but that is no longer the case. She has persistent fatigue, which is the most bothersome thing for her. She also has occassional muscle spasms, but these self resolved.    Fatigue: Has been ongoing and debilitating over the past year. She has not gone to PT, had any medications, or other treatments specifically for fatigue. Does endorse family Hx hypothyroidism, but no personal Hx. Has had some mild weight gain with inactivity. She also endorses poor tolerance of hot weather, but denies any fatigue or pain associated with it. She denies any associated weakness, sensory changes, pain, diplopia or pruritis.    Pain: Her osteomyelitis was at L2-3, and she also has a bulging disc at L2. She was taking oxycodone at the time 2-3x daily in acute phase, but has been off of it since January 2023. She has been getting massages every few weeks to stretch her Psoas, which is helping with her posture.    Function/social: Lives in a 3 story town home by herelf. Used to work part time at [employer], currently unemployed. She used to do woodworking and photography, but does not anymore because heat tends to make her fatigue worse. She used to exercise in her complex pool, but has also stopped doing that due to heat. She is independent of mobility and ADLs.    She is on venlafaxane for anger and it is very effect.  PRECAUTIONS: Other: osteomyelitis  SUBJECTIVE: Reporting an increase in symptoms over the past several days, has requested blood work to rule out any return of osteomyelitis.  Experiencing pains in LE's  described as "electricity".  Patient will return to Dr. Engler for f/u on 04/02/22   PAIN:  Are you having pain? No   OBJECTIVE: (objective measures completed at initial evaluation unless otherwise dated)   DIAGNOSTIC FINDINGS:  EXAM: MRI LUMBAR SPINE WITHOUT CONTRAST   TECHNIQUE: Multiplanar, multisequence MR imaging of the lumbar spine was performed. No intravenous contrast was administered.   COMPARISON:  MR lumbar 04/09/2021 and 02/20/2021; CT lumbar 01/17/2021; X-ray lumbar 05/23/2011.   FINDINGS: Despite efforts by the technologist and patient, mild motion artifact is present on today's exam and could not be eliminated. This reduces exam sensitivity and specificity.   Segmentation: Conventional anatomy assumed, with the last open disc space designated L5-S1.Concordant with previous imaging.   Alignment: Stable chronic degenerative grade 1 anterolisthesis at L4-5 and mild convex right scoliosis. Mild retrolisthesis at L1-2 is unchanged.   Vertebrae: Again demonstrated are chronic sequela of discitis and osteomyelitis at L2-3 with partial collapse of the L2 and L3 vertebral bodies and irregularity of the intervening disc space. The associated marrow edema has improved, and no progressive bone destruction identified. Stable chronic endplate degenerative changes at L1-2. No new foci of osteomyelitis   identified. Stable mild degenerative changes of the sacroiliac joints.   Conus medullaris: Extends to the L1-2 level and appears normal. Stable sacral Tarlov cysts.   Paraspinal and other soft tissues: Improved paraspinal inflammatory changes at L2-3. No focal paraspinal or epidural fluid collection.   Disc levels:   No significant disc space findings from T9-10 through T12-L1 aside from anterior osteophytes and scattered perineural cysts.   L1-2: Stable chronic degenerative disc disease with loss of disc height, annular disc bulging and endplate osteophytes. There  is moderate facet and ligamentous hypertrophy contributing to stable mild spinal stenosis and mild narrowing of the lateral recesses and foramina bilaterally.   L2-3: As above, improving changes of diskitis/osteomyelitis. Stable annular disc bulging, endplate osteophytes, facet and ligamentous hypertrophy contributing to mild-to-moderate spinal stenosis. There is stable lateral recess and foraminal narrowing bilaterally. No epidural fluid collection identified.   L3-4: Stable annular disc bulging, facet and ligamentous hypertrophy contributing to mild spinal stenosis.   L4-5: Chronic loss of disc height with annular disc bulging and advanced facet hypertrophy, accounting for the grade 1 anterolisthesis. Stable mild foraminal narrowing bilaterally.   L5-S1: Stable chronic degenerative disc disease with posterior osteophytes and mild facet hypertrophy. No spinal stenosis or nerve root encroachment.   IMPRESSION: 1. Further improvement in the bone marrow edema and paraspinal inflammatory changes associated with previously demonstrated L2-3 discitis and osteomyelitis. No progressive vertebral body collapse or recurrent fluid collection identified. 2. No new findings. 3. Stable multilevel spondylosis as detailed above.     Electronically Signed   By: William  Veazey M.D.   On: 07/04/2021 10:51   PATIENT SURVEYS:  FOTO 55(64 predicted); 03/13/22 45; 03/27/22 42%   SCREENING FOR RED FLAGS: negative   COGNITION:           Overall cognitive status: Within functional limits for tasks assessed                          SENSATION: Not tested   MUSCLE LENGTH: Hamstrings: Right 90 deg; Left 90 deg Thomas test: -20d from neutral B   POSTURE:  patient demos a forward flexed posture   PALPATION: Not tested   LUMBAR ROM:    Active  A/PROM  eval  Flexion WNL  Extension -25% from neutral  Right lateral flexion    Left lateral flexion    Right rotation    Left rotation      (Blank rows = not tested)   LOWER EXTREMITY ROM:      Active  Right eval Left eval  Hip flexion -20d -20d  Hip extension      Hip abduction      Hip adduction      Hip internal rotation      Hip external rotation      Knee flexion      Knee extension      Ankle dorsiflexion      Ankle plantarflexion      Ankle inversion      Ankle eversion       (Blank rows = not tested)   LOWER EXTREMITY MMT:     MMT Right eval Left eval  Hip flexion      Hip extension      Hip abduction      Hip adduction      Hip internal rotation      Hip external rotation      Knee flexion        Knee extension      Ankle dorsiflexion      Ankle plantarflexion      Ankle inversion      Ankle eversion      Core/trunk flexion 3 3   (Blank rows = not tested)   LUMBAR SPECIAL TESTS:  Straight leg raise test: Negative, Slump test: Negative, Single leg stance test: Negative, FABER test: Positive, and Thomas test: Positive   FUNCTIONAL TESTS:  5 times sit to stand: 16s with arms crossed   GAIT: Distance walked: 75ft x2 Assistive device utilized: None Level of assistance: Complete Independence Comments: antalgic        TODAY'S TREATMENT  [facility] Adult PT Treatment:                                                DATE: 03/27/22 Therapeutic Exercise: Curl ups 15x Bridge w/ball 15x Bridge 15x Supine alternating march 15/15 Manual hip flexor stretch in prone 30s x1 B Nustep L4 8 min PPT 3s hold 15x Prone quad stretch 30s x2 B 15 PKBs in b/t stretches Seated hamstring stretch 30s x1 B    [facility] Adult PT Treatment:                                                DATE: 03/22/2022 Aquatic therapy at [facility]- [address] - therapeutic pool temp 92 degrees Pt enters building ambulating with SPC. Treatment took place in water 3.8 to  4 ft 8 in.feet deep depending upon activity.  Pt entered and exited the pool via stair and handrails independently   Therapeutic Exercise: Walking  forward/backwards/side stepping x 3 laps each Marching walk x3 laps with rainbow dumbbells Side stepping with shoulder ab/adduction colorful dumbbells x3 laps Runners stretch on bottom step x30" BIL Hamstring stretch on bottom step x30" BIL Hip flexor stretch - 45'' Figure 4 squat stretch, BIL UE support x30" BIL Riding yellow pool noodle and breast stroke across pool - 2 laps Sit to stand - 3x10 - step 4 At with yellow DB pt performed LE exercise: Hip abd/add 1x20 BIL Noodle stomp - yellow - 2x20 ea (NT) Hip ext/flex with knee straight 1x 20 BIL Squat 1x20 Walking lunge - 2 laps Hip Circles CC/CCW x20 each BIL  Pt requires the buoyancy of water for active assisted exercises with buoyancy supported for strengthening and AROM exercises. Hydrostatic pressure also supports joints by unweighting joint load by at least 50 % in 3-4 feet depth water. 80% in chest to neck deep water. Water will provide assistance with movement using the current and laminar flow while the buoyancy reduces weight bearing. Pt requires the viscosity of the water for resistance with strengthening exercises.  [facility] Adult PT Treatment:                                                DATE: 03/20/22 Therapeutic Exercise: Curl ups 15x Bridge w/ball 15x Supine alternating march 15/15 Prone lie PA mobs to L5-1 grade II, 10x each segment Manual ER stretch in prone 30s x2 B Nustep L4 8 min SLR   15/15  Standing at wall  opposite shoulder flexion/hip extension 7/7 Prone quad stretch 30s x2 B 15 PKBs in b/t stretches      HOME EXERCISE PROGRAM: Access Code: [email or web address] URL: [email or web address] Date: 02/27/2022 Prepared by: Sharlynn Oliphant  Exercises - Hip Flexor Stretch at [facility] of Bed  - 2 x daily - 5 x weekly - 1 sets - 3 reps - 30s hold - Supine Posterior Pelvic Tilt  - 2 x daily - 5 x weekly - 1 sets - 15 reps - Supine Bridge  - 2 x daily - 5 x weekly - 1 sets - 15 reps - Sidelying Open Book Thoracic  Lumbar Rotation and Extension  - 2 x daily - 5 x weekly - 1 sets - 10 reps   ASSESSMENT:   CLINICAL IMPRESSION: Patient relating an exacerbation of symptoms w/o inciting cause.  She has requested blood work to r/o any exacerbation of her osteomyelitis.  FOTO scores hs declined, hip ROM has regressed since flare-up and paresthesias have increased in intensity.  Marked tightness remain in hip flexors/psoas group and symptoms appear to have a mechanical spinal component.  Patient may be a candidate for trigger point injections if conservative stretching does not yield results.      OBJECTIVE IMPAIRMENTS Abnormal gait, decreased activity tolerance, decreased balance, decreased endurance, decreased knowledge of condition, decreased mobility, difficulty walking, decreased ROM, increased muscle spasms, improper body mechanics, postural dysfunction, and pain.    ACTIVITY LIMITATIONS carrying, lifting, standing, and bed mobility   PERSONAL FACTORS Age, Past/current experiences, Time since onset of injury/illness/exacerbation, and 1 comorbidity: osteomyelitis  are also affecting patient's functional outcome.    REHAB POTENTIAL: Good   CLINICAL DECISION MAKING: Evolving/moderate complexity   EVALUATION COMPLEXITY: Moderate     GOALS: Goals reviewed with patient? Yes   SHORT TERM GOALS: Target date: 03/13/2022   Patient to demonstrate independence in HEP  Baseline:[id number] Goal status: Met   2.  Increase B hip extension to -10d from neutral Baseline: -20d from neutral; 03/13/22 -10d from neutral observed during treatment. Goal status: Met   3.  Patient to perform 10 curl ups Baseline: 1 performed at eval; 03/13/22 10-15 reps Goal status: Met       LONG TERM GOALS: Target date: 04/03/2022   0d hip extension  Baseline: -20d from neutral; 03/27/22 -25d B Goal status: Ongoing   2.  Decrease 5x STS time to <15s Baseline: 16s arms crossed; 03/27/22 18s Goal status: INITIAL   3.   Increase core strength to 3+/5 Baseline: 3/5 as evidenced by inability to perform curl up Goal status: Ongoing   4.  Increase FOTO score to 64 Baseline: 55; 10/10 45; 03/27/22 42 Goal status: Ongoing         PLAN: PT FREQUENCY: 2x/week   PT DURATION: 6 weeks   PLANNED INTERVENTIONS: Therapeutic exercises, Therapeutic activity, Neuromuscular re-education, Balance training, Gait training, Patient/Family education, Self Care, Joint mobilization, Stair training, DME instructions, Aquatic Therapy, Dry Needling, Spinal mobilization, Manual therapy, and Re-evaluation.   PLAN FOR NEXT SESSION: HEP review and update, core strength, hip stretch, body mechanics training, review benefit of stretching, f/u on 10/30 MD visit    Lanice Shirts, PT 03/27/2022, 3:25 PM

## 2022-03-29 ENCOUNTER — Ambulatory Visit: Payer: Medicare Other | Admitting: Physical Therapy

## 2022-03-29 ENCOUNTER — Encounter: Payer: Self-pay | Admitting: Physical Therapy

## 2022-03-29 DIAGNOSIS — M5459 Other low back pain: Secondary | ICD-10-CM

## 2022-03-29 DIAGNOSIS — M791 Myalgia, unspecified site: Secondary | ICD-10-CM

## 2022-03-29 DIAGNOSIS — M868X8 Other osteomyelitis, other site: Secondary | ICD-10-CM

## 2022-03-29 DIAGNOSIS — R5381 Other malaise: Secondary | ICD-10-CM

## 2022-03-29 NOTE — Therapy (Signed)
OUTPATIENT PHYSICAL THERAPY TREATMENT NOTE/10th visit progress note   Patient Name: Haley Singh MRN: [id number] DOB:[birth year], 71 y.o., female Today's Date: 03/29/2022  PCP: Katherina Mires, MD REFERRING PROVIDER: Gertie Gowda, DO  END OF SESSION:      PT End of Session - 03/29/22 1355     Visit Number 12    Number of Visits 12    Date for PT Re-Evaluation 04/17/22    Authorization Type [employer]    PT Start Time 1355    PT Stop Time 1440    PT Time Calculation (min) 45 min    Activity Tolerance Patient tolerated treatment well    Behavior During Therapy WFL for tasks assessed/performed                 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   Hyperlipidemia    Hypertension    followed by pcp  (11-10-2019 pt stated never had a stress test)   Insomnia    MDD (major depressive disorder)    Mild persistent asthma    followed by pcp and dr Bernita Raisin (pulmonologist)   RLS (restless legs syndrome)    Vertebral osteomyelitis ([place]) 02/21/2021   Past Surgical History:  Procedure Laterality Date   BUBBLE STUDY  01/25/2021   Procedure: BUBBLE STUDY;  Surgeon: Buford Dresser, MD;  Location: [place];  Service: Cardiovascular;;   CATARACT EXTRACTION W/ INTRAOCULAR LENS  IMPLANT, BILATERAL  2009; 2010   CYSTOSCOPY/URETEROSCOPY/HOLMIUM LASER/STENT PLACEMENT Right 08/26/2019   Procedure: CYSTOSCOPY/RETROGRADE/URETEROSCOPY/HOLMIUM LASER/STENT PLACEMENT;  Surgeon: Ceasar Mons, MD;  Location[facility];  Service: Urology;  Laterality: Right;   CYSTOSCOPY/URETEROSCOPY/HOLMIUM LASER/STENT PLACEMENT Left 11/17/2019   Procedure: CYSTOSCOPY/URETEROSCOPY, RETROGRADE,LASER LITHOTRIPSY,  STENT PLACEMENT;  Surgeon: Ceasar Mons, MD;  Location: [facility];  Service: Urology;  Laterality: Left;   CYSTOSCOPY/URETEROSCOPY/HOLMIUM LASER/STENT PLACEMENT Left 02/19/2021   Procedure: CYSTOSCOPY/URETEROSCOPY/HOLMIUM LASER/STENT PLACEMENT;  Surgeon: Lucas Mallow, MD;  Location: [facility];  Service: Urology;  Laterality: Left;   EXPLORATORY LAPAROTOMY W/ BOWEL RESECTION  01-15-2004  _0    small bowel resection for stricture/ benzoar   EXTRACORPOREAL SHOCK WAVE LITHOTRIPSY Right 03/27/2018   Procedure: EXTRACORPOREAL SHOCK WAVE LITHOTRIPSY (ESWL);  Surgeon: Ardis Hughs, MD;  Location: [facility];  Service: Urology;  Laterality: Right;   EXTRACORPOREAL SHOCK WAVE LITHOTRIPSY  x2 2008;  2009;  2015   HOLMIUM LASER APPLICATION Left 2/70/6237   Procedure: HOLMIUM LASER APPLICATION;  Surgeon: Ceasar Mons, MD;  Location: [facility];  Service: Urology;  Laterality: Left;   IR FLUORO GUIDED NEEDLE PLC ASPIRATION/INJECTION LOC  02/22/2021   ORIF ANKLE FRACTURE Right 09/ 2008 _1    per pt has retained hardware   TEE WITHOUT CARDIOVERSION N/A 01/25/2021   Procedure: TRANSESOPHAGEAL ECHOCARDIOGRAM (TEE);  Surgeon: Buford Dresser, MD;  Location: [place] [facility];  Service: Cardiovascular;  Laterality: N/A;   TONSILLECTOMY  age 29   Patient Active Problem List   Diagnosis Date Noted   Fatigue due to excessive exertion 01/31/2022   Weight gain 01/31/2022   Penicillin allergy    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   Vertebral osteomyelitis ([place]) 02/21/2021   Intractable nausea and  vomiting 02/18/2021   Hypokalemia 02/18/2021   MSSA bacteremia    Discitis of lumbar region    Osteomyelitis ([place]) 01/17/2021   Mild persistent asthma without complication 30/13/1438   OSA (obstructive sleep apnea) 03/03/2014    REFERRING DIAG: O87.3XXA (ICD-10-CM) - Fatigue due to excessive exertion, initial encounter M54.89 (ICD-10-CM) - Midline back pain,  unspecified back location, unspecified chronicity   THERAPY DIAG:  Other low back pain  Myalgia, unspecified site  Physical deconditioning  Other osteomyelitis, other site [facility])  Rationale for Evaluation and Treatment Rehabilitation  PERTINENT HISTORY: Ms. Cooperman is a 71 y/o female presenting to PM&R clinic as a referral from her PCP Dr. Reed Breech for pain s/p osteomyelitis . She states when they made the referral, she was in pain but that is no longer the case. She has persistent fatigue, which is the most bothersome thing for her. She also has occassional muscle spasms, but these self resolved.    Fatigue: Has been ongoing and debilitating over the past year. She has not gone to PT, had any medications, or other treatments specifically for fatigue. Does endorse family Hx hypothyroidism, but no personal Hx. Has had some mild weight gain with inactivity. She also endorses poor tolerance of hot weather, but denies any fatigue or pain associated with it. She denies any associated weakness, sensory changes, pain, diplopia or pruritis.    Pain: Her osteomyelitis was at L2-3, and she also has a bulging disc at L2. She was taking oxycodone at the time 2-3x daily in acute phase, but has been off of it since January 2023. She has been getting massages every few weeks to stretch her Psoas, which is helping with her posture.    Function/social: Lives in a 3 story town home by [employer]. Used to work part time at [employer], currently unemployed. She used to do woodworking and photography, but does not anymore because heat tends to make her fatigue worse. She used to exercise in her complex pool, but has also stopped doing that due to heat. She is independent of mobility and ADLs.    She is on venlafaxane for anger and it is very effect.  PRECAUTIONS: Other: osteomyelitis  SUBJECTIVE: Pt reports that her legs continue to cause her discomfort but denies pain.  She is following up with MD soon for  bloodwork.   PAIN:  Are you having pain? No   OBJECTIVE: (objective measures completed at initial evaluation unless otherwise dated)   DIAGNOSTIC FINDINGS:  EXAM: MRI LUMBAR SPINE WITHOUT CONTRAST   TECHNIQUE: Multiplanar, multisequence MR imaging of the lumbar spine was performed. No intravenous contrast was administered.   COMPARISON:  MR lumbar 04/09/2021 and 02/20/2021; CT lumbar 01/17/2021; X-ray lumbar 05/23/2011.   FINDINGS: Despite efforts by the technologist and patient, mild motion artifact is present on today's exam and could not be eliminated. This reduces exam sensitivity and specificity.   Segmentation: Conventional anatomy assumed, with the last open disc space designated L5-S1.Concordant with previous imaging.   Alignment: Stable chronic degenerative grade 1 anterolisthesis at L4-5 and mild convex right scoliosis. Mild retrolisthesis at L1-2 is unchanged.   Vertebrae: Again demonstrated are chronic sequela of discitis and osteomyelitis at L2-3 with partial collapse of the L2 and L3 vertebral bodies and irregularity of the intervening disc space. The associated marrow edema has improved, and no progressive bone destruction identified. Stable chronic endplate degenerative changes at L1-2. No new foci of osteomyelitis identified. Stable mild degenerative changes of the sacroiliac joints.  Conus medullaris: Extends to the L1-2 level and appears normal. Stable sacral Tarlov cysts.   Paraspinal and other soft tissues: Improved paraspinal inflammatory changes at L2-3. No focal paraspinal or epidural fluid collection.   Disc levels:   No significant disc space findings from T9-10 through T12-L1 aside from anterior osteophytes and scattered perineural cysts.   L1-2: Stable chronic degenerative disc disease with loss of disc height, annular disc bulging and endplate osteophytes. There is moderate facet and ligamentous hypertrophy contributing to  stable mild spinal stenosis and mild narrowing of the lateral recesses and foramina bilaterally.   L2-3: As above, improving changes of diskitis/osteomyelitis. Stable annular disc bulging, endplate osteophytes, facet and ligamentous hypertrophy contributing to mild-to-moderate spinal stenosis. There is stable lateral recess and foraminal narrowing bilaterally. No epidural fluid collection identified.   L3-4: Stable annular disc bulging, facet and ligamentous hypertrophy contributing to mild spinal stenosis.   L4-5: Chronic loss of disc height with annular disc bulging and advanced facet hypertrophy, accounting for the grade 1 anterolisthesis. Stable mild foraminal narrowing bilaterally.   L5-S1: Stable chronic degenerative disc disease with posterior osteophytes and mild facet hypertrophy. No spinal stenosis or nerve root encroachment.   IMPRESSION: 1. Further improvement in the bone marrow edema and paraspinal inflammatory changes associated with previously demonstrated L2-3 discitis and osteomyelitis. No progressive vertebral body collapse or recurrent fluid collection identified. 2. No new findings. 3. Stable multilevel spondylosis as detailed above.     Electronically Signed   By: Richardean Sale M.D.   On: 07/04/2021 10:51   PATIENT SURVEYS:  FOTO 55(64 predicted); 03/13/22 45; 03/27/22 42%   SCREENING FOR RED FLAGS: negative   COGNITION:           Overall cognitive status: Within functional limits for tasks assessed                          SENSATION: Not tested   MUSCLE LENGTH: Hamstrings: Right 90 deg; Left 90 deg Thomas test: -20d from neutral B   POSTURE:  patient demos a forward flexed posture   PALPATION: Not tested   LUMBAR ROM:    Active  A/PROM  eval  Flexion WNL  Extension -25% from neutral  Right lateral flexion    Left lateral flexion    Right rotation    Left rotation     (Blank rows = not tested)   LOWER EXTREMITY ROM:       Active  Right eval Left eval  Hip flexion -20d -20d  Hip extension      Hip abduction      Hip adduction      Hip internal rotation      Hip external rotation      Knee flexion      Knee extension      Ankle dorsiflexion      Ankle plantarflexion      Ankle inversion      Ankle eversion       (Blank rows = not tested)   LOWER EXTREMITY MMT:     MMT Right eval Left eval  Hip flexion      Hip extension      Hip abduction      Hip adduction      Hip internal rotation      Hip external rotation      Knee flexion      Knee extension      Ankle dorsiflexion  Ankle plantarflexion      Ankle inversion      Ankle eversion      Core/trunk flexion 3 3   (Blank rows = not tested)   LUMBAR SPECIAL TESTS:  Straight leg raise test: Negative, Slump test: Negative, Single leg stance test: Negative, FABER test: Positive, and Thomas test: Positive   FUNCTIONAL TESTS:  5 times sit to stand: 16s with arms crossed   GAIT: Distance walked: 33f x2 Assistive device utilized: None Level of assistance: Complete Independence Comments: antalgic      [facility] Adult PT Treatment:                                                DATE: 03/29/2022 [address] - therapeutic pool temp 92 degrees Pt enters building ambulating with SPC. Treatment took place in water 3.8 to  4 ft 8 in.feet deep depending upon activity.  Pt entered and exited the pool via stair and handrails independently   Therapeutic Exercise: Walking forward/backwards/side stepping x 3 laps each Marching walk x3 laps with rainbow dumbbells Side stepping with shoulder ab/adduction colorful dumbbells x3 laps Runners stretch on bottom step x30" BIL Hamstring stretch on bottom step x30" BIL Hip flexor stretch - 45'' Figure 4 squat stretch, BIL UE support x30" BIL Riding yellow pool noodle and breast stroke across pool - 2 laps Sit to stand - 3x10 - step 4 Rotations with hands for  resistance (increase in pain) SL w/ toe touch d/c d/t pain At with yellow DB pt performed LE exercise: Hip abd/add 1x20 BIL Noodle stomp - yellow - 2x20 ea (NT) Hip ext/flex with knee straight 1x 20 BIL Squat 1x20 Walking lunge - 2 laps  Pt requires the buoyancy of water for active assisted exercises with buoyancy supported for strengthening and AROM exercises. Hydrostatic pressure also supports joints by unweighting joint load by at least 50 % in 3-4 feet depth water. 80% in chest to neck deep water. Water will provide assistance with movement using the current and laminar flow while the buoyancy reduces weight bearing. Pt requires the viscosity of the water for resistance with strengthening exercises.   TODAY'S TREATMENT  [facility] Adult PT Treatment:                                                DATE: 03/27/22 Therapeutic Exercise: Curl ups 15x Bridge w/ball 15x Bridge 15x Supine alternating march 15/15 Manual hip flexor stretch in prone 30s x1 B Nustep L4 8 min PPT 3s hold 15x Prone quad stretch 30s x2 B 15 PKBs in b/t stretches Seated hamstring stretch 30s x1 B    [facility] Adult PT Treatment:                                                DATE: 03/22/2022 [address] therapeutic pool temp 92 degrees Pt enters building ambulating with SPC. Treatment took place in water 3.8 to  4 ft 8 in.feet deep depending upon activity.  Pt entered and exited the pool  via stair and handrails independently   Therapeutic Exercise: Walking forward/backwards/side stepping x 3 laps each Marching walk x3 laps with rainbow dumbbells Side stepping with shoulder ab/adduction colorful dumbbells x3 laps Runners stretch on bottom step x30" BIL Hamstring stretch on bottom step x30" BIL Hip flexor stretch - 45'' Figure 4 squat stretch, BIL UE support x30" BIL Riding yellow pool noodle and breast stroke across pool - 2 laps Sit to stand - 3x10 - step 4 At with yellow DB  pt performed LE exercise: Hip abd/add 1x20 BIL Noodle stomp - yellow - 2x20 ea (NT) Hip ext/flex with knee straight 1x 20 BIL Squat 1x20 Walking lunge - 2 laps Hip Circles CC/CCW x20 each BIL  Pt requires the buoyancy of water for active assisted exercises with buoyancy supported for strengthening and AROM exercises. Hydrostatic pressure also supports joints by unweighting joint load by at least 50 % in 3-4 feet depth water. 80% in chest to neck deep water. Water will provide assistance with movement using the current and laminar flow while the buoyancy reduces weight bearing. Pt requires the viscosity of the water for resistance with strengthening exercises.  [facility] Adult PT Treatment:                                                DATE: 03/20/22 Therapeutic Exercise: Curl ups 15x Bridge w/ball 15x Supine alternating march 15/15 Prone lie PA mobs to L5-1 grade II, 10x each segment Manual ER stretch in prone 30s x2 B Nustep L4 8 min SLR 15/15  Standing at wall  opposite shoulder flexion/hip extension 7/7 Prone quad stretch 30s x2 B 15 PKBs in b/t stretches      HOME EXERCISE PROGRAM: Access Code: [email or web address] URL: [email or web address] Date: 02/27/2022 Prepared by: Sharlynn Oliphant  Exercises - Hip Flexor Stretch at [facility] of Bed  - 2 x daily - 5 x weekly - 1 sets - 3 reps - 30s hold - Supine Posterior Pelvic Tilt  - 2 x daily - 5 x weekly - 1 sets - 15 reps - Supine Bridge  - 2 x daily - 5 x weekly - 1 sets - 15 reps - Sidelying Open Book Thoracic Lumbar Rotation and Extension  - 2 x daily - 5 x weekly - 1 sets - 10 reps   ASSESSMENT:   CLINICAL IMPRESSION: Session today focused on hip and cores in the aquatic environment for use of buoyancy to offload joints and the viscosity of water as resistance during therapeutic exercise.  Patient was able to tolerate all prescribed exercises in the aquatic environment with no adverse effects and reports minimal  pain at the end of  the session. Anouk does have more discomfort in her legs today this seems to be aggd with rotational movements.  These will be d/c'd moving forward..  This Patient continues to benefit from skilled PT services on land and aquatic based and should be progressed as able to improve functional independence.       OBJECTIVE IMPAIRMENTS Abnormal gait, decreased activity tolerance, decreased balance, decreased endurance, decreased knowledge of condition, decreased mobility, difficulty walking, decreased ROM, increased muscle spasms, improper body mechanics, postural dysfunction, and pain.    ACTIVITY LIMITATIONS carrying, lifting, standing, and bed mobility   PERSONAL FACTORS Age, Past/current experiences, Time since onset of injury/illness/exacerbation, and 1 comorbidity: osteomyelitis  are also affecting patient's  functional outcome.    REHAB POTENTIAL: Good   CLINICAL DECISION MAKING: Evolving/moderate complexity   EVALUATION COMPLEXITY: Moderate     GOALS: Goals reviewed with patient? Yes   SHORT TERM GOALS: Target date: 03/13/2022   Patient to demonstrate independence in HEP  Baseline:[id number] Goal status: Met   2.  Increase B hip extension to -10d from neutral Baseline: -20d from neutral; 03/13/22 -10d from neutral observed during treatment. Goal status: Met   3.  Patient to perform 10 curl ups Baseline: 1 performed at eval; 03/13/22 10-15 reps Goal status: Met       LONG TERM GOALS: Target date: 04/03/2022   0d hip extension  Baseline: -20d from neutral; 03/27/22 -25d B Goal status: Ongoing   2.  Decrease 5x STS time to <15s Baseline: 16s arms crossed; 03/27/22 18s Goal status: INITIAL   3.  Increase core strength to 3+/5 Baseline: 3/5 as evidenced by inability to perform curl up Goal status: Ongoing   4.  Increase FOTO score to 64 Baseline: 55; 10/10 45; 03/27/22 42 Goal status: Ongoing         PLAN: PT FREQUENCY: 2x/week   PT DURATION: 6 weeks    PLANNED INTERVENTIONS: Therapeutic exercises, Therapeutic activity, Neuromuscular re-education, Balance training, Gait training, Patient/Family education, Self Care, Joint mobilization, Stair training, DME instructions, Aquatic Therapy, Dry Needling, Spinal mobilization, Manual therapy, and Re-evaluation.   PLAN FOR NEXT SESSION: HEP review and update, core strength, hip stretch, body mechanics training, review benefit of stretching, f/u on 10/30 MD visit    Mathis Dad, PT 03/29/2022, 2:48 PM

## 2022-04-02 ENCOUNTER — Encounter: Payer: Self-pay | Admitting: Physical Medicine and Rehabilitation

## 2022-04-02 ENCOUNTER — Encounter: Payer: Medicare Other | Attending: Physical Medicine & Rehabilitation | Admitting: Physical Medicine and Rehabilitation

## 2022-04-02 VITALS — BP 145/89 | HR 83 | Ht 66.0 in | Wt 218.4 lb

## 2022-04-02 DIAGNOSIS — T733XXS Exhaustion due to excessive exertion, sequela: Secondary | ICD-10-CM | POA: Insufficient documentation

## 2022-04-02 DIAGNOSIS — M5440 Lumbago with sciatica, unspecified side: Secondary | ICD-10-CM | POA: Insufficient documentation

## 2022-04-02 DIAGNOSIS — M4646 Discitis, unspecified, lumbar region: Secondary | ICD-10-CM | POA: Diagnosis present

## 2022-04-02 DIAGNOSIS — M2550 Pain in unspecified joint: Secondary | ICD-10-CM | POA: Diagnosis present

## 2022-04-02 NOTE — Assessment & Plan Note (Signed)
osteomyelitis and discitis at L2-3 s/p tx, discharged from ID and Neurosurgery 10/2021  Last MRI 06/2021, showed resolving inflammation and stable multilevel spondylosis.  PCP repeated labs including CRP recently, which was normal. Low suspicion for recurrent infection.

## 2022-04-02 NOTE — Assessment & Plan Note (Addendum)
Celebrex reduced to 200 mg once daily by PCP d/t increasing BUN, Cr  Advised can continue voltaren gel on hands for intermittent joint pain.  Discussed possible alternate medications for chronic pain including tylenol,  tramadol, gabapentin, lyrica. Patient wishes to defer at this time due to prior s/e or avoidance of habit-forming medication. Advised her to schedule follow up if she ever changes her mind and wishes to pursue this further.   Provided educational material on antiinflammatory diet for pain control

## 2022-04-02 NOTE — Assessment & Plan Note (Signed)
Since last visit, new intermittent and positional radicular leg pains and spasms concerning for nerve impingement.  Last MRI 06/2021. Will order repeat w/o contrast given changes in symptoms. Will call with results once available; possible outcomes include recs to follow up with neurosurgery vs. ESI (would not perform directly at site of prior discitis)

## 2022-04-02 NOTE — Patient Instructions (Signed)
Continue Aquatherapy and PT for improving your strength, gait, posture, fatigue and endurance. Orders have been placed; if insurance does not approve more therapy days this year, we can re-attempt in January.  MRI lumbar spine without contrast ordered for shooting/burning pains and cramps that are intermittent and positional. I will call you with these results once they are available.  Continue Celebrex once daily and use PRN voltaren gel for hand arthritic pains. If you feel your symptoms are poorly controlled, you can schedule a follow up to discuss alternative medications.  Follow up with me in 3 months.

## 2022-04-02 NOTE — Assessment & Plan Note (Addendum)
Much improved with PT and aquatherapy; soon running out of therapy days but feels she is still getting significant functional benefits.  Scripts renewed today; noted if insurance does not approve, can re-try in the new year once therapy days renew.   Labs from last visit WNL, including thyroid studies, folate, B12, mag. Did have mild increase in BUN/Cr, which was discussed with patient.   Follow up in 3 months

## 2022-04-02 NOTE — Progress Notes (Signed)
Subjective:    Patient ID: Haley Singh, female    DOB: 07/27/1950, 71 y.o.   MRN: [id number]  HPI Ms. Haley Singh is a 71 y/o female presenting to PM&R clinic as a follow up after referral from her PCP Dr. Skip Estimable for pain s/p osteomyelitis, improved, with ongoing concerns for fatigue.  Per last PCP note 10/25:  Creatinine continued to be elevated with GFR of 52, normal urine microscopy. I recommended she reduce Celebrex dose to 200 mg daily, reduce famotidine from 40 to 20 mg, and we started gabapentin.  She reports she tales tylenol as needed and robaxin infrequently. Started gabapentin without excessive sedation.    Interval Hx:  - She states fatigue is improving overall. She states she can walk from the parking lot to the clinic without issue now (note: prior would get visible SOB/fatigued ambulating in the office). She states the aquatherapy especially has been very beneficial, but therapies overall have made a huge improvement.    - Pt reports she had electric shock pains going through her legs last week, similar to when she had her infection. She states any movement would exacerbate this, all the way to her ankles. She was concerned enough that she saw per PCP, who did a CRP which was normal. It stopped spontaneously a few days later and has not re-occurred.   - She does note "even when I cough, there's something there, flexing those [thigh] muscles." PT noted hip twisting in the therapy pool caused a similar sensation, and stopped. Pressure or massage on her back does not reproduce her symptoms or pain.   -  She tried increasing her gabapentin frequency but is unsure if it had any effect. She thinks it may have made her drowsy but overall just did not find it beneficial.   - She reduced her celebrex per her PCP for her renal function. She weaned herself to 1 per day. She states her PCP was asking if there were alternate medications for her. She uses voltaren gel intermittently, with  mild benefit. She states oxycodone in the past has made her feel like "ants are crawling all over me".   Pain Inventory Average Pain 0 Pain Right Now 0 My pain is intermittent and sharp  In the last 24 hours, has pain interfered with the following? General activity 0 Relation with others 0 Enjoyment of life 0 What TIME of day is your pain at its worst? varies Sleep (in general) Good  Pain is worse with: some activites Pain improves with: rest Relief from Meds:  no med  Family History  Problem Relation Age of Onset   Breast cancer Maternal Aunt    Heart disease Father    Heart Problems Father    Social History   Socioeconomic History   Marital status: Single    Spouse name: Not on file   Number of children: Not on file   Years of education: Not on file   Highest education level: Not on file  Occupational History   Occupation: Retired    Associate Professor: RETIRED  Tobacco Use   Smoking status: Never   Smokeless tobacco: Never  Vaping Use   Vaping Use: Never used  Substance and Sexual Activity   Alcohol use: Yes    Alcohol/week: 7.0 standard drinks of alcohol    Types: 7 Standard drinks or equivalent per week    Comment: scotch or wine maybe 1 a night   Drug use: Never   Sexual activity: Not on  file  Other Topics Concern   Not on file  Social History Narrative   Single, lives alone   No children   OCCUPATION: retired, IT support   Social Determinants of Corporate investment banker Strain: Not on file  Food Insecurity: Not on file  Transportation Needs: Not on file  Physical Activity: Not on file  Stress: Not on file  Social Connections: Not on file   Past Surgical History:  Procedure Laterality Date   BUBBLE STUDY  01/25/2021   Procedure: BUBBLE STUDY;  Surgeon: Jodelle Red, MD;  Location: [facility] ENDOSCOPY;  Service: Cardiovascular;;   CATARACT EXTRACTION W/ INTRAOCULAR LENS  IMPLANT, BILATERAL  2009; 2010   CYSTOSCOPY/URETEROSCOPY/HOLMIUM LASER/STENT  PLACEMENT Right 08/26/2019   Procedure: CYSTOSCOPY/RETROGRADE/URETEROSCOPY/HOLMIUM LASER/STENT PLACEMENT;  Surgeon: Rene Paci, MD;  Location: [facility];  Service: Urology;  Laterality: Right;   CYSTOSCOPY/URETEROSCOPY/HOLMIUM LASER/STENT PLACEMENT Left 11/17/2019   Procedure: CYSTOSCOPY/URETEROSCOPY, RETROGRADE,LASER LITHOTRIPSY, STENT PLACEMENT;  Surgeon: Rene Paci, MD;  Location[facility];  Service: Urology;  Laterality: Left;   CYSTOSCOPY/URETEROSCOPY/HOLMIUM LASER/STENT PLACEMENT Left 02/19/2021   Procedure: CYSTOSCOPY/URETEROSCOPY/HOLMIUM LASER/STENT PLACEMENT;  Surgeon: Crista Elliot, MD;  Location: [facility]S;  Service: Urology;  Laterality: Left;   EXPLORATORY LAPAROTOMY W/ BOWEL RESECTION  01-15-2004  @[facility]    small bowel resection for stricture/ benzoar   EXTRACORPOREAL SHOCK WAVE LITHOTRIPSY Right 03/27/2018   Procedure: EXTRACORPOREAL SHOCK WAVE LITHOTRIPSY (ESWL);  Surgeon: 03/29/2018, MD;  Location: [facility];  Service: Urology;  Laterality: Right;   EXTRACORPOREAL SHOCK WAVE LITHOTRIPSY  x2 2008;  2009;  2015   HOLMIUM LASER APPLICATION Left 11/17/2019   Procedure: HOLMIUM LASER APPLICATION;  Surgeon: 11/19/2019, MD;  Location: [facility];  Service: Urology;  Laterality: Left;   IR FLUORO GUIDED NEEDLE PLC ASPIRATION/INJECTION LOC  02/22/2021   ORIF ANKLE FRACTURE Right 09/ 2008 @[facility]    per pt has retained hardware   TEE WITHOUT CARDIOVERSION N/A 01/25/2021   Procedure: TRANSESOPHAGEAL ECHOCARDIOGRAM (TEE);  Surgeon: , MD;  Location: [facility] ENDOSCOPY;  Service: Cardiovascular;  Laterality: N/A;   TONSILLECTOMY  age 55   Past Surgical History:  Procedure Laterality Date   BUBBLE STUDY  01/25/2021   Procedure: BUBBLE STUDY;  Surgeon: 14, MD;  Location: [facility] ENDOSCOPY;  Service: Cardiovascular;;   CATARACT EXTRACTION W/ INTRAOCULAR LENS  IMPLANT,  BILATERAL  2009; 2010   CYSTOSCOPY/URETEROSCOPY/HOLMIUM LASER/STENT PLACEMENT Right 08/26/2019   Procedure: CYSTOSCOPY/RETROGRADE/URETEROSCOPY/HOLMIUM LASER/STENT PLACEMENT;  Surgeon: 2011, MD;  Location: [facility];  Service: Urology;  Laterality: Right;   CYSTOSCOPY/URETEROSCOPY/HOLMIUM LASER/STENT PLACEMENT Left 11/17/2019   Procedure: CYSTOSCOPY/URETEROSCOPY, RETROGRADE,LASER LITHOTRIPSY, STENT PLACEMENT;  Surgeon: [facility] MD;  Location: [facility];  Service: Urology;  Laterality: Left;   CYSTOSCOPY/URETEROSCOPY/HOLMIUM LASER/STENT PLACEMENT Left 02/19/2021   Procedure: CYSTOSCOPY/URETEROSCOPY/HOLMIUM LASER/STENT PLACEMENT;  Surgeon: [facility] MD;  Location: [facility];  Service: Urology;  Laterality: Left;   EXPLORATORY LAPAROTOMY W/ BOWEL RESECTION  01-15-2004  @[facility]    small bowel resection for stricture/ benzoar   EXTRACORPOREAL SHOCK WAVE LITHOTRIPSY Right 03/27/2018   Procedure: EXTRACORPOREAL SHOCK WAVE LITHOTRIPSY (ESWL);  Surgeon: 01-17-2004, MD;  Location: [facility];  Service: Urology;  Laterality: Right;   EXTRACORPOREAL SHOCK WAVE LITHOTRIPSY  x2 2008;  2009;  2015   HOLMIUM LASER APPLICATION Left 11/17/2019   Procedure: HOLMIUM LASER APPLICATION;  Surgeon: 04-23-1981, MD;  Location: [facility];  Service: Urology;  Laterality: Left;   IR FLUORO GUIDED NEEDLE PLC ASPIRATION/INJECTION LOC  02/22/2021   ORIF ANKLE FRACTURE Right 09/ 2008 @[facility]    per pt has retained hardware   TEE WITHOUT CARDIOVERSION N/A 01/25/2021   Procedure: TRANSESOPHAGEAL ECHOCARDIOGRAM (TEE);  Surgeon: 01/27/2021, MD;  Location: [facility] ENDOSCOPY;  Service: Cardiovascular;  Laterality: N/A;   TONSILLECTOMY  age 89   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   GAD (generalized anxiety disorder)    History of kidney  stones    History of small bowel obstruction 01/2004   s/p small bowel resection for benzoar/ small bowel stricture   Hyperlipidemia    Hypertension    followed by pcp  (11-10-2019 pt stated never had a stress test)   Insomnia    MDD (major depressive disorder)    Mild persistent asthma    followed by pcp and dr 01-10-2020 (pulmonologist)   RLS (restless legs syndrome)    Vertebral osteomyelitis (HCC) 02/21/2021   BP (!) 145/89   Pulse 83   Ht 5\' 6"  (1.676 m)   Wt 218 lb 6.4 oz (99.1 kg)   SpO2 94%   BMI 35.25 kg/m   Opioid Risk Score:   Fall Risk Score:  `1  Depression screen PHQ 2/9     04/02/2022    1:02 PM 01/31/2022    2:19 PM 10/04/2021    9:08 AM 08/02/2021    2:28 PM 03/10/2021   11:39 AM  Depression screen PHQ 2/9  Decreased Interest 0 0 0 0 0  Down, Depressed, Hopeless 0 0 0 0 0  PHQ - 2 Score 0 0 0 0 0  Altered sleeping  0     Tired, decreased energy  3     Change in appetite  0     Feeling bad or failure about yourself   0     Trouble concentrating  0     Moving slowly or fidgety/restless  0     Suicidal thoughts  0     PHQ-9 Score  3     Difficult doing work/chores  Somewhat difficult        Review of Systems  Constitutional: Negative.   HENT: Negative.    Eyes: Negative.   Respiratory: Negative.    Cardiovascular: Negative.   Gastrointestinal: Negative.   Endocrine: Negative.   Genitourinary: Negative.   Musculoskeletal:        Occasional pain shoots down right leg, uncertain what brings it on.  Skin: Negative.   Allergic/Immunologic: Negative.   Neurological: Negative.   Hematological: Negative.   Psychiatric/Behavioral: Negative.    All other systems reviewed and are negative.      Objective:   Physical Exam  Physical Exam: Constitution: Appropriate appearance for age. No apparent distress  HEENT: PERRL, EOMI grossly intact.  Resp: CTAB. No rales, rhonchi, or wheezing. Cardio: RRR. No mumurs, rubs, or gallops. No peripheral  edema. Abdomen: Nondistended. Nontender. +bowel sounds. Psych: Appropriate mood and affect.  Neurologic Exam:   DTRs: no clonus on ankle jerk bilaterally Sensory exam: revealed normal sensation in all dermatomal regions in bilateral  LEs. Motor exam: strength 5-/5 in bilateral Hip flexors; otherwise 5/5 in both LEs. Coordination: Fine motor coordination was normal.   Gait: Gait was stiff but otherwise normal.   MSK: Flattening of lumbar lordosis with extremely limited ROM in extension - unchanged.  No TTP throughout lumbosacral spine, paraspinals, SI joints, PSIS, or trochanteric bursa. No pain on facet loading. Unable to reproduce radicular pains.       Assessment & Plan:  Ms. Donate is a 71 y/o female presenting to PM&R clinic as a follow up for pain s/p osteomyelitis, now intermittent and positional with occasional muscle spasms in legs, along with ongoing fatigue which is improving.   Fatigue due to excessive exertion, sequela Assessment & Plan: Much improved with PT and aquatherapy; soon running out of therapy days but feels she is still getting significant functional benefits.  Scripts renewed today; noted if insurance does not approve, can re-try in the new year once therapy days renew.   Labs from last visit WNL, including thyroid studies, folate, B12, mag. Did have mild increase in BUN/Cr, which was discussed with patient.   Follow up in 3 months  Orders: -     Ambulatory referral to Physical Therapy -     Ambulatory referral to Physical Therapy  Discitis of lumbar region Assessment & Plan: osteomyelitis and discitis at L2-3 s/p tx, discharged from [facility] Neurosurgery 10/2021  Last MRI 06/2021, showed resolving inflammation and stable multilevel spondylosis.  PCP repeated labs including CRP recently, which was normal. Low suspicion for recurrent infection.   Orders: -     MR LUMBAR SPINE WO CONTRAST; Future  Back pain of lumbar region with sciatica Assessment &  Plan: Since last visit, new intermittent and positional radicular leg pains and spasms concerning for nerve impingement.  Last MRI 06/2021. Will order repeat w/o contrast given changes in symptoms. Will call with results once available; possible outcomes include recs to follow up with neurosurgery vs. ESI (would not perform directly at site of prior discitis)  Orders: -     MR [place]; Future -     Ambulatory referral to Physical Therapy -     Ambulatory referral to Physical Therapy  Arthralgia, unspecified joint Assessment & Plan: Celebrex reduced to 200 mg once daily by PCP d/t increasing BUN, Cr  Advised can continue voltaren gel on hands for intermittent joint pain.  Discussed possible alternate medications for chronic pain including tylenol,  tramadol, gabapentin, lyrica. Patient wishes to defer at this time due to prior s/e or avoidance of habit-forming medication. Advised her to schedule follow up if she ever changes her mind and wishes to pursue this further.   Provided educational material on antiinflammatory diet for pain control      Gertie Gowda, DO 04/02/2022

## 2022-04-03 ENCOUNTER — Ambulatory Visit: Payer: Medicare Other

## 2022-04-03 DIAGNOSIS — M5459 Other low back pain: Secondary | ICD-10-CM

## 2022-04-03 DIAGNOSIS — R5381 Other malaise: Secondary | ICD-10-CM

## 2022-04-03 DIAGNOSIS — M791 Myalgia, unspecified site: Secondary | ICD-10-CM

## 2022-04-03 NOTE — Therapy (Signed)
OUTPATIENT PHYSICAL THERAPY TREATMENT NOTE/10th visit progress note   Patient Name: Haley Singh MRN: [id number] DOB:06-22-1950, 71 y.o., female Today's Date: 04/03/2022  PCP: Katherina Mires, MD REFERRING PROVIDER: Gertie Gowda, DO  END OF SESSION:      PT End of Session - 04/03/22 1401     Visit Number 13    Number of Visits 16    Date for PT Re-Evaluation 04/17/22    Authorization Type [employer]    PT Start Time 1400    PT Stop Time 1440    PT Time Calculation (min) 40 min    Activity Tolerance Patient tolerated treatment well    Behavior During Therapy WFL for tasks assessed/performed                  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   Hyperlipidemia    Hypertension    followed by pcp  (11-10-2019 pt stated never had a stress test)   Insomnia    MDD (major depressive disorder)    Mild persistent asthma    followed by pcp and dr Bernita Raisin (pulmonologist)   RLS (restless legs syndrome)    Vertebral osteomyelitis ([place]) 02/21/2021   Past Surgical History:  Procedure Laterality Date   BUBBLE STUDY  01/25/2021   Procedure: BUBBLE STUDY;  Surgeon: Buford Dresser, MD;  Location: [place];  Service: Cardiovascular;;   CATARACT EXTRACTION W/ INTRAOCULAR LENS  IMPLANT, BILATERAL  2009; 2010   CYSTOSCOPY/URETEROSCOPY/HOLMIUM LASER/STENT PLACEMENT Right 08/26/2019   Procedure: CYSTOSCOPY/RETROGRADE/URETEROSCOPY/HOLMIUM LASER/STENT PLACEMENT;  Surgeon: Ceasar Mons, MD;  Location: [facility];  Service: Urology;  Laterality: Right;   CYSTOSCOPY/URETEROSCOPY/HOLMIUM LASER/STENT PLACEMENT Left 11/17/2019   Procedure: CYSTOSCOPY/URETEROSCOPY, RETROGRADE,LASER LITHOTRIPSY,  STENT PLACEMENT;  Surgeon: Ceasar Mons, MD;  Location: [facility];  Service: Urology;  Laterality: Left;   CYSTOSCOPY/URETEROSCOPY/HOLMIUM LASER/STENT PLACEMENT Left 02/19/2021   Procedure: CYSTOSCOPY/URETEROSCOPY/HOLMIUM LASER/STENT PLACEMENT;  Surgeon: Lucas Mallow, MD;  Location: [facility];  Service: Urology;  Laterality: Left;   EXPLORATORY LAPAROTOMY W/ BOWEL RESECTION  01-15-2004  _0    small bowel resection for stricture/ benzoar   EXTRACORPOREAL SHOCK WAVE LITHOTRIPSY Right 03/27/2018   Procedure: EXTRACORPOREAL SHOCK WAVE LITHOTRIPSY (ESWL);  Surgeon: Ardis Hughs, MD;  Location: [facility];  Service: Urology;  Laterality: Right;   EXTRACORPOREAL SHOCK WAVE LITHOTRIPSY  x2 2008;  2009;  2015   HOLMIUM LASER APPLICATION Left 1/88/4166   Procedure: HOLMIUM LASER APPLICATION;  Surgeon: Ceasar Mons, MD;  [facility];  Service: Urology;  Laterality: Left;   IR FLUORO GUIDED NEEDLE PLC ASPIRATION/INJECTION LOC  02/22/2021   ORIF ANKLE FRACTURE Right 09/ 2008 _1    per pt has retained hardware   TEE WITHOUT CARDIOVERSION N/A 01/25/2021   Procedure: TRANSESOPHAGEAL ECHOCARDIOGRAM (TEE);  Surgeon: Buford Dresser, MD;  Location: [facility];  Service: Cardiovascular;  Laterality: N/A;   TONSILLECTOMY  age 74   Patient Active Problem List   Diagnosis Date Noted   Back pain of lumbar region with sciatica 04/02/2022   Arthralgia 04/02/2022   Fatigue due to excessive exertion 01/31/2022   Weight gain 01/31/2022   Penicillin allergy  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   Vertebral osteomyelitis ([place]) 02/21/2021   Intractable nausea and vomiting 02/18/2021   Hypokalemia 02/18/2021   MSSA bacteremia    Discitis of lumbar region    Osteomyelitis ([place]) 01/17/2021   Mild persistent asthma without complication 54/65/0354   OSA (obstructive sleep apnea) 03/03/2014    REFERRING DIAG: S56.3XXA (ICD-10-CM) - Fatigue due to  excessive exertion, initial encounter M54.89 (ICD-10-CM) - Midline back pain, unspecified back location, unspecified chronicity   THERAPY DIAG:  Other low back pain  Myalgia, unspecified site  Physical deconditioning  Rationale for Evaluation and Treatment Rehabilitation  PERTINENT HISTORY: Haley Singh is a 71 y/o female presenting to PM&R clinic as a referral from her PCP Dr. Reed Breech for pain s/p osteomyelitis . She states when they made the referral, she was in pain but that is no longer the case. She has persistent fatigue, which is the most bothersome thing for her. She also has occassional muscle spasms, but these self resolved.    Fatigue: Has been ongoing and debilitating over the past year. She has not gone to PT, had any medications, or other treatments specifically for fatigue. Does endorse family Hx hypothyroidism, but no personal Hx. Has had some mild weight gain with inactivity. She also endorses poor tolerance of hot weather, but denies any fatigue or pain associated with it. She denies any associated weakness, sensory changes, pain, diplopia or pruritis.    Pain: Her osteomyelitis was at L2-3, and she also has a bulging disc at L2. She was taking oxycodone at the time 2-3x daily in acute phase, but has been off of it since January 2023. She has been getting massages every few weeks to stretch her Psoas, which is helping with her posture.    Function/social: Lives in a 3 story town home by [employer]. Used to work part time at [employer], currently unemployed. She used to do woodworking and photography, but does not anymore because heat tends to make her fatigue worse. She used to exercise in her complex pool, but has also stopped doing that due to heat. She is independent of mobility and ADLs.    She is on venlafaxane for anger and it is very effect.  PRECAUTIONS: Other: osteomyelitis  SUBJECTIVE: Patient saw MD for f/u, will schedule MRI to update findings.  Recommended continue  OPPT to address endurance deficits and hip flexibility deficits.   PAIN:  Are you having pain? No   OBJECTIVE: (objective measures completed at initial evaluation unless otherwise dated)   DIAGNOSTIC FINDINGS:  EXAM: MRI LUMBAR SPINE WITHOUT CONTRAST   TECHNIQUE: Multiplanar, multisequence MR imaging of the lumbar spine was performed. No intravenous contrast was administered.   COMPARISON:  MR lumbar 04/09/2021 and 02/20/2021; CT lumbar 01/17/2021; X-ray lumbar 05/23/2011.   FINDINGS: Despite efforts by the technologist and patient, mild motion artifact is present on today's exam and could not be eliminated. This reduces exam sensitivity and specificity.   Segmentation: Conventional anatomy assumed, with the last open disc space designated L5-S1.Concordant with previous imaging.   Alignment: Stable chronic degenerative grade 1 anterolisthesis at L4-5 and mild convex right scoliosis. Mild retrolisthesis at L1-2 is unchanged.   Vertebrae: Again demonstrated are chronic sequela of discitis and osteomyelitis at L2-3 with partial collapse of the L2 and L3 vertebral bodies and irregularity of the intervening disc space. The associated marrow edema has improved, and no progressive bone destruction identified. Stable chronic endplate degenerative changes at L1-2. No new foci of osteomyelitis identified. Stable mild  degenerative changes of the sacroiliac joints.   Conus medullaris: Extends to the L1-2 level and appears normal. Stable sacral Tarlov cysts.   Paraspinal and other soft tissues: Improved paraspinal inflammatory changes at L2-3. No focal paraspinal or epidural fluid collection.   Disc levels:   No significant disc space findings from T9-10 through T12-L1 aside from anterior osteophytes and scattered perineural cysts.   L1-2: Stable chronic degenerative disc disease with loss of disc height, annular disc bulging and endplate osteophytes. There is moderate facet  and ligamentous hypertrophy contributing to stable mild spinal stenosis and mild narrowing of the lateral recesses and foramina bilaterally.   L2-3: As above, improving changes of diskitis/osteomyelitis. Stable annular disc bulging, endplate osteophytes, facet and ligamentous hypertrophy contributing to mild-to-moderate spinal stenosis. There is stable lateral recess and foraminal narrowing bilaterally. No epidural fluid collection identified.   L3-4: Stable annular disc bulging, facet and ligamentous hypertrophy contributing to mild spinal stenosis.   L4-5: Chronic loss of disc height with annular disc bulging and advanced facet hypertrophy, accounting for the grade 1 anterolisthesis. Stable mild foraminal narrowing bilaterally.   L5-S1: Stable chronic degenerative disc disease with posterior osteophytes and mild facet hypertrophy. No spinal stenosis or nerve root encroachment.   IMPRESSION: 1. Further improvement in the bone marrow edema and paraspinal inflammatory changes associated with previously demonstrated L2-3 discitis and osteomyelitis. No progressive vertebral body collapse or recurrent fluid collection identified. 2. No new findings. 3. Stable multilevel spondylosis as detailed above.     Electronically Signed   By: Richardean Sale M.D.   On: 07/04/2021 10:51   PATIENT SURVEYS:  FOTO 55(64 predicted); 03/13/22 45; 03/27/22 42%   SCREENING FOR RED FLAGS: negative   COGNITION:           Overall cognitive status: Within functional limits for tasks assessed                          SENSATION: Not tested   MUSCLE LENGTH: Hamstrings: Right 90 deg; Left 90 deg Thomas test: -20d from neutral B   POSTURE:  patient demos a forward flexed posture   PALPATION: Not tested   LUMBAR ROM:    Active  A/PROM  eval 04/03/22  Flexion WNL WFL  Extension -25% from neutral -25% from neutral  Right lateral flexion     Left lateral flexion     Right rotation      Left rotation      (Blank rows = not tested)   LOWER EXTREMITY ROM:      Active  Right eval Left eval PROM L 04/03/22 PROM R 04/03/22  Hip flexion -20d -20d -30d -15d  Hip extension        Hip abduction        Hip adduction        Hip internal rotation        Hip external rotation        Knee flexion        Knee extension        Ankle dorsiflexion        Ankle plantarflexion        Ankle inversion        Ankle eversion         (Blank rows = not tested)   LOWER EXTREMITY MMT:     MMT Right eval Left eval  Hip flexion  4  4-  Hip extension      Hip  abduction  3+  3+  Hip adduction      Hip internal rotation      Hip external rotation      Knee flexion      Knee extension      Ankle dorsiflexion      Ankle plantarflexion      Ankle inversion      Ankle eversion      Core/trunk flexion 3 3   (Blank rows = not tested)   LUMBAR SPECIAL TESTS:  Straight leg raise test: Negative, Slump test: Negative, Single leg stance test: Negative, FABER test: Positive, and Thomas test: Positive   FUNCTIONAL TESTS:  5 times sit to stand: 16s with arms crossed   GAIT: Distance walked: 64f x2 Assistive device utilized: None Level of assistance: Complete Independence Comments: antalgic    [facility] Adult PT Treatment:                                                DATE: 04/03/22 Therapeutic Exercise: 6MWT 786 ft Re-Assessment   [facility]                                                [facility]: 03/29/2022 [address] therapeutic pool temp 92 degrees Pt enters building ambulating with SPC. Treatment took place in water 3.8 to  4 ft 8 in.feet deep depending upon activity.  Pt entered and exited the pool via stair and handrails independently   Therapeutic Exercise: Walking forward/backwards/side stepping x 3 laps each Marching walk x3 laps with rainbow dumbbells Side stepping with shoulder ab/adduction colorful dumbbells x3  laps Runners stretch on bottom step x30" BIL Hamstring stretch on bottom step x30" BIL Hip flexor stretch - 45'' Figure 4 squat stretch, BIL UE support x30" BIL Riding yellow pool noodle and breast stroke across pool - 2 laps Sit to stand - 3x10 - step 4 Rotations with hands for resistance (increase in pain) SL w/ toe touch d/c d/t pain At with yellow DB pt performed LE exercise: Hip abd/add 1x20 BIL Noodle stomp - yellow - 2x20 ea (NT) Hip ext/flex with knee straight 1x 20 BIL Squat 1x20 Walking lunge - 2 laps  Pt requires the buoyancy of water for active assisted exercises with buoyancy supported for strengthening and AROM exercises. Hydrostatic pressure also supports joints by unweighting joint load by at least 50 % in 3-4 feet depth water. 80% in chest to neck deep water. Water will provide assistance with movement using the current and laminar flow while the buoyancy reduces weight bearing. Pt requires the viscosity of the water for resistance with strengthening exercises.   TODAY'S TREATMENT  [facility] Adult PT Treatment:                                                DATE: 03/27/22 Therapeutic Exercise: Curl ups 15x Bridge w/ball 15x Bridge 15x Supine alternating march 15/15 Manual hip flexor stretch in prone 30s x1 B Nustep L4 8 min PPT 3s hold 15x Prone quad stretch 30s x2 B  15 PKBs in b/t stretches Seated hamstring stretch 30s x1 B    [facility] Adult PT Treatment:                                                DATE: 03/22/2022 Aquatic therapy at [address] - therapeutic pool temp 92 degrees Pt enters building ambulating with SPC. Treatment took place in water 3.8 to  4 ft 8 in.feet deep depending upon activity.  Pt entered and exited the pool via stair and handrails independently   Therapeutic Exercise: Walking forward/backwards/side stepping x 3 laps each Marching walk x3 laps with rainbow dumbbells Side stepping with shoulder ab/adduction colorful  dumbbells x3 laps Runners stretch on bottom step x30" BIL Hamstring stretch on bottom step x30" BIL Hip flexor stretch - 45'' Figure 4 squat stretch, BIL UE support x30" BIL Riding yellow pool noodle and breast stroke across pool - 2 laps Sit to stand - 3x10 - step 4 At with yellow DB pt performed LE exercise: Hip abd/add 1x20 BIL Noodle stomp - yellow - 2x20 ea (NT) Hip ext/flex with knee straight 1x 20 BIL Squat 1x20 Walking lunge - 2 laps Hip Circles CC/CCW x20 each BIL  Pt requires the buoyancy of water for active assisted exercises with buoyancy supported for strengthening and AROM exercises. Hydrostatic pressure also supports joints by unweighting joint load by at least 50 % in 3-4 feet depth water. 80% in chest to neck deep water. Water will provide assistance with movement using the current and laminar flow while the buoyancy reduces weight bearing. Pt requires the viscosity of the water for resistance with strengthening exercises.  [facility] Adult PT Treatment:                                                DATE: 03/20/22 Therapeutic Exercise: Curl ups 15x Bridge w/ball 15x Supine alternating march 15/15 Prone lie PA mobs to L5-1 grade II, 10x each segment Manual ER stretch in prone 30s x2 B Nustep L4 8 min SLR 15/15  Standing at wall  opposite shoulder flexion/hip extension 7/7 Prone quad stretch 30s x2 B 15 PKBs in b/t stretches      HOME EXERCISE PROGRAM: Access Code: [email or web address] URL: [email or web address] Date: 02/27/2022 Prepared by: Sharlynn Oliphant  Exercises - Hip Flexor Stretch at [facility] of Bed  - 2 x daily - 5 x weekly - 1 sets - 3 reps - 30s hold - Supine Posterior Pelvic Tilt  - 2 x daily - 5 x weekly - 1 sets - 15 reps - Supine Bridge  - 2 x daily - 5 x weekly - 1 sets - 15 reps - Sidelying Open Book Thoracic Lumbar Rotation and Extension  - 2 x daily - 5 x weekly - 1 sets - 10 reps   ASSESSMENT:   CLINICAL IMPRESSION: Today's session addressed  endurance deficits through 6 MWT, assessment of hip ROM and strength with patient showing continued marked deficits in hip extension due to chronically tight hip flexors.  Abduction strength deficits identified due to inability to extend hips placing her at a biomechanical disadvantage.  Recommend continue OPPT until results of MRI obtained to further identify cause of tight hip flexors.  OBJECTIVE  IMPAIRMENTS Abnormal gait, decreased activity tolerance, decreased balance, decreased endurance, decreased knowledge of condition, decreased mobility, difficulty walking, decreased ROM, increased muscle spasms, improper body mechanics, postural dysfunction, and pain.    ACTIVITY LIMITATIONS carrying, lifting, standing, and bed mobility   PERSONAL FACTORS Age, Past/current experiences, Time since onset of injury/illness/exacerbation, and 1 comorbidity: osteomyelitis  are also affecting patient's functional outcome.    REHAB POTENTIAL: Good   CLINICAL DECISION MAKING: Evolving/moderate complexity   EVALUATION COMPLEXITY: Moderate     GOALS: Goals reviewed with patient? Yes   SHORT TERM GOALS: Target date: 03/13/2022   Patient to demonstrate independence in HEP  Baseline:[id number] Goal status: Met   2.  Increase B hip extension to -10d from neutral Baseline: -20d from neutral; 03/13/22 -10d from neutral observed during treatment. Goal status: Met   3.  Patient to perform 10 curl ups Baseline: 1 performed at eval; 03/13/22 10-15 reps Goal status: Met       LONG TERM GOALS: Target date: 04/03/2022   0d hip extension  Baseline: -20d from neutral; 03/27/22 -25d B Goal status: Ongoing   2.  Decrease 5x STS time to <15s Baseline: 16s arms crossed; 03/27/22 18s Goal status: INITIAL   3.  Increase core strength to 3+/5 Baseline: 3/5 as evidenced by inability to perform curl up Goal status: Ongoing   4.  Increase FOTO score to 64 Baseline: 55; 10/10 45; 03/27/22 42 Goal status:  Ongoing         PLAN: PT FREQUENCY: 2x/week   PT DURATION: 6 weeks   PLANNED INTERVENTIONS: Therapeutic exercises, Therapeutic activity, Neuromuscular re-education, Balance training, Gait training, Patient/Family education, Self Care, Joint mobilization, Stair training, DME instructions, Aquatic Therapy, Dry Needling, Spinal mobilization, Manual therapy, and Re-evaluation.   PLAN FOR NEXT SESSION: HEP review and update, core strength, hip stretch, body mechanics training, review benefit of stretching, f/u on 10/30 MD visit    Lanice Shirts, PT 04/03/2022, 3:43 PM

## 2022-04-05 ENCOUNTER — Encounter: Payer: Self-pay | Admitting: Physical Therapy

## 2022-04-05 ENCOUNTER — Ambulatory Visit: Payer: Medicare Other | Attending: Physical Medicine and Rehabilitation | Admitting: Physical Therapy

## 2022-04-05 DIAGNOSIS — M868X8 Other osteomyelitis, other site: Secondary | ICD-10-CM | POA: Insufficient documentation

## 2022-04-05 DIAGNOSIS — M5459 Other low back pain: Secondary | ICD-10-CM | POA: Insufficient documentation

## 2022-04-05 DIAGNOSIS — M791 Myalgia, unspecified site: Secondary | ICD-10-CM | POA: Diagnosis present

## 2022-04-05 DIAGNOSIS — R5381 Other malaise: Secondary | ICD-10-CM | POA: Insufficient documentation

## 2022-04-05 NOTE — Therapy (Signed)
OUTPATIENT PHYSICAL THERAPY TREATMENT NOTE/10th visit progress note   Patient Name: Haley Singh MRN: [id number] DOB:1950/07/21, 71 y.o., female Today's Date: 04/05/2022  PCP: Haley Mires, MD REFERRING PROVIDER: Gertie Gowda, DO  END OF SESSION:      PT End of Session - 04/05/22 1353     Visit Number 14    Number of Visits 16    Date for PT Re-Evaluation 04/17/22    Authorization Type [employer]    PT Start Time 1355    PT Stop Time 1440    PT Time Calculation (min) 45 min    Activity Tolerance Patient tolerated treatment well    Behavior During Therapy WFL for tasks assessed/performed                   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   Hyperlipidemia    Hypertension    followed by pcp  (11-10-2019 pt stated never had a stress test)   Insomnia    MDD (major depressive disorder)    Mild persistent asthma    followed by pcp and dr Haley Singh (pulmonologist)   RLS (restless legs syndrome)    Vertebral osteomyelitis ([place]) 02/21/2021   Past Surgical History:  Procedure Laterality Date   BUBBLE STUDY  01/25/2021   Procedure: BUBBLE STUDY;  Surgeon: Haley Dresser, MD;  Location: [facility];  Service: Cardiovascular;;   CATARACT EXTRACTION W/ INTRAOCULAR LENS  IMPLANT, BILATERAL  2009; 2010   CYSTOSCOPY/URETEROSCOPY/HOLMIUM LASER/STENT PLACEMENT Right 08/26/2019   Procedure: CYSTOSCOPY/RETROGRADE/URETEROSCOPY/HOLMIUM LASER/STENT PLACEMENT;  Surgeon: Haley Mons, MD;  Location[facility];  Service: Urology;  Laterality: Right;   CYSTOSCOPY/URETEROSCOPY/HOLMIUM LASER/STENT PLACEMENT Left 11/17/2019   Procedure: CYSTOSCOPY/URETEROSCOPY, RETROGRADE,LASER LITHOTRIPSY,  STENT PLACEMENT;  Surgeon: Haley Mons, MD;  Location: [facility];  Service: Urology;  Laterality: Left;   CYSTOSCOPY/URETEROSCOPY/HOLMIUM LASER/STENT PLACEMENT Left 02/19/2021   Procedure: CYSTOSCOPY/URETEROSCOPY/HOLMIUM LASER/STENT PLACEMENT;  Surgeon: Haley Mallow, MD;  Location: [facility];  Service: Urology;  Laterality: Left;   EXPLORATORY LAPAROTOMY W/ BOWEL RESECTION  01-15-2004  _0    small bowel resection for stricture/ benzoar   EXTRACORPOREAL SHOCK WAVE LITHOTRIPSY Right 03/27/2018   Procedure: EXTRACORPOREAL SHOCK WAVE LITHOTRIPSY (ESWL);  Surgeon: Haley Hughs, MD;  Location: [facility];  Service: Urology;  Laterality: Right;   EXTRACORPOREAL SHOCK WAVE LITHOTRIPSY  x2 2008;  2009;  2015   HOLMIUM LASER APPLICATION Left 11/25/7626   Procedure: HOLMIUM LASER APPLICATION;  Surgeon: Haley Mons, MD;  Location[facility];  Service: Urology;  Laterality: Left;   IR FLUORO GUIDED NEEDLE PLC ASPIRATION/INJECTION LOC  02/22/2021   ORIF ANKLE FRACTURE Right 09/ 2008 _1    per pt has retained hardware   TEE WITHOUT CARDIOVERSION N/A 01/25/2021   Procedure: TRANSESOPHAGEAL ECHOCARDIOGRAM (TEE);  Surgeon: Haley Dresser, MD;  Location: [facility];  Service: Cardiovascular;  Laterality: N/A;   TONSILLECTOMY  age 74   Patient Active Problem List   Diagnosis Date Noted   Back pain of lumbar region with sciatica 04/02/2022   Arthralgia 04/02/2022   Fatigue due to excessive exertion 01/31/2022   Weight gain 01/31/2022   Penicillin allergy  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   Vertebral osteomyelitis ([place]) 02/21/2021   Intractable nausea and vomiting 02/18/2021   Hypokalemia 02/18/2021   MSSA bacteremia    Discitis of lumbar region    Osteomyelitis ([place]) 01/17/2021   Mild persistent asthma without complication 01/60/1093   OSA (obstructive sleep apnea) 03/03/2014    REFERRING DIAG: A35.3XXA (ICD-10-CM) - Fatigue due to  excessive exertion, initial encounter M54.89 (ICD-10-CM) - Midline back pain, unspecified back location, unspecified chronicity   THERAPY DIAG:  Other low back pain  Myalgia, unspecified site  Physical deconditioning  Rationale for Evaluation and Treatment Rehabilitation  PERTINENT HISTORY: Haley Singh is a 71 y/o female presenting to PM&R clinic as a referral from her PCP Dr. Reed Singh for pain s/p osteomyelitis . She states when they made the referral, she was in pain but that is no longer the case. She has persistent fatigue, which is the most bothersome thing for her. She also has occassional muscle spasms, but these self resolved.    Fatigue: Has been ongoing and debilitating over the past year. She has not gone to PT, had any medications, or other treatments specifically for fatigue. Does endorse family Hx hypothyroidism, but no personal Hx. Has had some mild weight gain with inactivity. She also endorses poor tolerance of hot weather, but denies any fatigue or pain associated with it. She denies any associated weakness, sensory changes, pain, diplopia or pruritis.    Pain: Her osteomyelitis was at L2-3, and she also has a bulging disc at L2. She was taking oxycodone at the time 2-3x daily in acute phase, but has been off of it since January 2023. She has been getting massages every few weeks to stretch her Psoas, which is helping with her posture.    Function/social: Lives in a 3 story town home by [employer]. Used to work part time at [employer], currently unemployed. She used to do woodworking and photography, but does not anymore because heat tends to make her fatigue worse. She used to exercise in her complex pool, but has also stopped doing that due to heat. She is independent of mobility and ADLs.    She is on venlafaxane for anger and it is very effect.  PRECAUTIONS: Other: osteomyelitis  SUBJECTIVE: Haley Singh reports that she is quite stiff today.  Today will be her last aquatic visit as  she will concentrate on land based therapy moving forward.   PAIN:  Are you having pain? No   OBJECTIVE: (objective measures completed at initial evaluation unless otherwise dated)   DIAGNOSTIC FINDINGS:  EXAM: MRI LUMBAR SPINE WITHOUT CONTRAST   TECHNIQUE: Multiplanar, multisequence MR imaging of the lumbar spine was performed. No intravenous contrast was administered.   COMPARISON:  MR lumbar 04/09/2021 and 02/20/2021; CT lumbar 01/17/2021; X-ray lumbar 05/23/2011.   FINDINGS: Despite efforts by the technologist and patient, mild motion artifact is present on today's exam and could not be eliminated. This reduces exam sensitivity and specificity.   Segmentation: Conventional anatomy assumed, with the last open disc space designated L5-S1.Concordant with previous imaging.   Alignment: Stable chronic degenerative grade 1 anterolisthesis at L4-5 and mild convex right scoliosis. Mild retrolisthesis at L1-2 is unchanged.   Vertebrae: Again demonstrated are chronic sequela of discitis and osteomyelitis at L2-3 with partial collapse of the L2 and L3 vertebral bodies and irregularity of the intervening disc space. The associated marrow edema has improved, and no progressive bone destruction identified. Stable chronic endplate degenerative changes at L1-2. No new foci of osteomyelitis  identified. Stable mild degenerative changes of the sacroiliac joints.   Conus medullaris: Extends to the L1-2 level and appears normal. Stable sacral Tarlov cysts.   Paraspinal and other soft tissues: Improved paraspinal inflammatory changes at L2-3. No focal paraspinal or epidural fluid collection.   Disc levels:   No significant disc space findings from T9-10 through T12-L1 aside from anterior osteophytes and scattered perineural cysts.   L1-2: Stable chronic degenerative disc disease with loss of disc height, annular disc bulging and endplate osteophytes. There is moderate facet and  ligamentous hypertrophy contributing to stable mild spinal stenosis and mild narrowing of the lateral recesses and foramina bilaterally.   L2-3: As above, improving changes of diskitis/osteomyelitis. Stable annular disc bulging, endplate osteophytes, facet and ligamentous hypertrophy contributing to mild-to-moderate spinal stenosis. There is stable lateral recess and foraminal narrowing bilaterally. No epidural fluid collection identified.   L3-4: Stable annular disc bulging, facet and ligamentous hypertrophy contributing to mild spinal stenosis.   L4-5: Chronic loss of disc height with annular disc bulging and advanced facet hypertrophy, accounting for the grade 1 anterolisthesis. Stable mild foraminal narrowing bilaterally.   L5-S1: Stable chronic degenerative disc disease with posterior osteophytes and mild facet hypertrophy. No spinal stenosis or nerve root encroachment.   IMPRESSION: 1. Further improvement in the bone marrow edema and paraspinal inflammatory changes associated with previously demonstrated L2-3 discitis and osteomyelitis. No progressive vertebral body collapse or recurrent fluid collection identified. 2. No new findings. 3. Stable multilevel spondylosis as detailed above.     Electronically Signed   By: Richardean Sale M.D.   On: 07/04/2021 10:51   PATIENT SURVEYS:  FOTO 55(64 predicted); 03/13/22 45; 03/27/22 42%   SCREENING FOR RED FLAGS: negative   COGNITION:           Overall cognitive status: Within functional limits for tasks assessed                          SENSATION: Not tested   MUSCLE LENGTH: Hamstrings: Right 90 deg; Left 90 deg Thomas test: -20d from neutral B   POSTURE:  patient demos a forward flexed posture   PALPATION: Not tested   LUMBAR ROM:    Active  A/PROM  eval 04/03/22  Flexion WNL WFL  Extension -25% from neutral -25% from neutral  Right lateral flexion     Left lateral flexion     Right rotation     Left  rotation      (Blank rows = not tested)   LOWER EXTREMITY ROM:      Active  Right eval Left eval PROM L 04/03/22 PROM R 04/03/22  Hip flexion -20d -20d -30d -15d  Hip extension        Hip abduction        Hip adduction        Hip internal rotation        Hip external rotation        Knee flexion        Knee extension        Ankle dorsiflexion        Ankle plantarflexion        Ankle inversion        Ankle eversion         (Blank rows = not tested)   LOWER EXTREMITY MMT:     MMT Right eval Left eval  Hip flexion  4  4-  Hip extension  Hip abduction  3+  3+  Hip adduction      Hip internal rotation      Hip external rotation      Knee flexion      Knee extension      Ankle dorsiflexion      Ankle plantarflexion      Ankle inversion      Ankle eversion      Core/trunk flexion 3 3   (Blank rows = not tested)   LUMBAR SPECIAL TESTS:  Straight leg raise test: Negative, Slump test: Negative, Single leg stance test: Negative, FABER test: Positive, and Thomas test: Positive   FUNCTIONAL TESTS:  5 times sit to stand: 16s with arms crossed   GAIT: Distance walked: 93f x2 Assistive device utilized: None Level of assistance: Complete Independence Comments: antalgic     [facility] Adult PT Treatment:                                                DATE: [address]/05/20[address] therapeutic pool temp 92 degrees Pt enters building ambulating with SPC. Treatment took place in water 3.8 to  4 ft 8 in.feet deep depending upon activity.  Pt entered and exited the pool via stair and handrails independently   Therapeutic Exercise: Walking forward/backwards/side stepping x 3 laps each Marching walk x3 laps with rainbow dumbbells Side stepping with shoulder ab/adduction colorful dumbbells x3 laps Runners stretch on bottom step x30" BIL Hamstring stretch on bottom step x30" BIL Hip flexor stretch - 45'' Figure 4 squat stretch, BIL UE  support x30" BIL Riding yellow pool noodle and breast stroke across pool - 2 laps Sit to stand - 3x10 - step 4  At with yellow DB pt performed LE exercise: Hip abd/add 1x20 BIL Noodle stomp - yellow - 2x20 ea (NT) Hip ext/flex with knee straight 1x 20 BIL Squat 2x20 Walking lunge - 2 laps  Pt requires the buoyancy of water for active assisted exercises with buoyancy supported for strengthening and AROM exercises. Hydrostatic pressure also supports joints by unweighting joint load by at least 50 % in 3-4 feet depth water. 80% in chest to neck deep water. Water will provide assistance with movement using the current and laminar flow while the buoyancy reduces weight bearing. Pt requires the viscosity of the water for resistance with strengthening exercises.   TODAY'S TREATMENT  [facility] Adult PT Treatment:                                                DATE: 03/27/22 Therapeutic Exercise: Curl ups 15x Bridge w/ball 15x Bridge 15x Supine alternating march 15/15 Manual hip flexor stretch in prone 30s x1 B Nustep L4 8 min PPT 3s hold 15x Prone quad stretch 30s x2 B 15 PKBs in b/t stretches Seated hamstring stretch 30s x1 B    [facility] Adult PT Treatment:                                                DATE: 03/22/2022 [address] therapeutic pool temp 92 degrees Pt enters building ambulating with SPC. Treatment took place in water 3.8 to  4 ft 8 in.feet deep depending upon activity.  Pt entered and exited the pool via stair and handrails independently   Therapeutic Exercise: Walking forward/backwards/side stepping x 3 laps each Marching walk x3 laps with rainbow dumbbells Side stepping with shoulder ab/adduction colorful dumbbells x3 laps Runners stretch on bottom step x30" BIL Hamstring stretch on bottom step x30" BIL Hip flexor stretch - 45'' Figure 4 squat stretch, BIL UE support x30" BIL Riding yellow pool noodle and breast stroke across pool - 2  laps Sit to stand - 3x10 - step 4 At with yellow DB pt performed LE exercise: Hip abd/add 1x20 BIL Noodle stomp - yellow - 2x20 ea (NT) Hip ext/flex with knee straight 1x 20 BIL Squat 1x20 Walking lunge - 2 laps Hip Circles CC/CCW x20 each BIL  Pt requires the buoyancy of water for active assisted exercises with buoyancy supported for strengthening and AROM exercises. Hydrostatic pressure also supports joints by unweighting joint load by at least 50 % in 3-4 feet depth water. 80% in chest to neck deep water. Water will provide assistance with movement using the current and laminar flow while the buoyancy reduces weight bearing. Pt requires the viscosity of the water for resistance with strengthening exercises.  [facility] Adult PT Treatment:                                                DATE: 03/20/22 Therapeutic Exercise: Curl ups 15x Bridge w/ball 15x Supine alternating march 15/15 Prone lie PA mobs to L5-1 grade II, 10x each segment Manual ER stretch in prone 30s x2 B Nustep L4 8 min SLR 15/15  Standing at wall  opposite shoulder flexion/hip extension 7/7 Prone quad stretch 30s x2 B 15 PKBs in b/t stretches      HOME EXERCISE PROGRAM: Access Code: [email or web address] URL: [email or web address] Date: 02/27/2022 Prepared by: Sharlynn Oliphant  Exercises - Hip Flexor Stretch at [facility] of Bed  - 2 x daily - 5 x weekly - 1 sets - 3 reps - 30s hold - Supine Posterior Pelvic Tilt  - 2 x daily - 5 x weekly - 1 sets - 15 reps - Supine Bridge  - 2 x daily - 5 x weekly - 1 sets - 15 reps - Sidelying Open Book Thoracic Lumbar Rotation and Extension  - 2 x daily - 5 x weekly - 1 sets - 10 reps   ASSESSMENT:   CLINICAL IMPRESSION: Session today focused on hip stretching/mobility combined with core and hip strengthening in the aquatic environment for use of buoyancy to offload joints and the viscosity of water as resistance during therapeutic exercise.  Patient was able to tolerate all  prescribed exercises in the aquatic environment with no adverse effects and reports 0/10 pain at the end of the session. Patient continues to benefit from skilled PT services on land and aquatic based and should be progressed as able to improve functional independence.    OBJECTIVE IMPAIRMENTS Abnormal gait, decreased activity tolerance, decreased balance, decreased endurance, decreased knowledge of condition, decreased mobility, difficulty walking, decreased ROM, increased muscle spasms, improper body mechanics, postural dysfunction, and pain.    ACTIVITY LIMITATIONS carrying, lifting, standing, and bed mobility   PERSONAL FACTORS Age, Past/current experiences, Time since onset  of injury/illness/exacerbation, and 1 comorbidity: osteomyelitis  are also affecting patient's functional outcome.    REHAB POTENTIAL: Good   CLINICAL DECISION MAKING: Evolving/moderate complexity   EVALUATION COMPLEXITY: Moderate     GOALS: Goals reviewed with patient? Yes   SHORT TERM GOALS: Target date: 03/13/2022   Patient to demonstrate independence in HEP  Baseline:[id number] Goal status: Met   2.  Increase B hip extension to -10d from neutral Baseline: -20d from neutral; 03/13/22 -10d from neutral observed during treatment. Goal status: Met   3.  Patient to perform 10 curl ups Baseline: 1 performed at eval; 03/13/22 10-15 reps Goal status: Met       LONG TERM GOALS: Target date: 04/03/2022   0d hip extension  Baseline: -20d from neutral; 03/27/22 -25d B Goal status: Ongoing   2.  Decrease 5x STS time to <15s Baseline: 16s arms crossed; 03/27/22 18s Goal status: INITIAL   3.  Increase core strength to 3+/5 Baseline: 3/5 as evidenced by inability to perform curl up Goal status: Ongoing   4.  Increase FOTO score to 64 Baseline: 55; 10/10 45; 03/27/22 42 Goal status: Ongoing         PLAN: PT FREQUENCY: 2x/week   PT DURATION: 6 weeks   PLANNED INTERVENTIONS: Therapeutic exercises,  Therapeutic activity, Neuromuscular re-education, Balance training, Gait training, Patient/Family education, Self Care, Joint mobilization, Stair training, DME instructions, Aquatic Therapy, Dry Needling, Spinal mobilization, Manual therapy, and Re-evaluation.   PLAN FOR NEXT SESSION: HEP review and update, core strength, hip stretch, body mechanics training, review benefit of stretching, f/u on 10/30 MD visit    Mathis Dad, PT 04/05/2022, 2:44 PM

## 2022-04-10 ENCOUNTER — Ambulatory Visit: Payer: Medicare Other

## 2022-04-10 DIAGNOSIS — M791 Myalgia, unspecified site: Secondary | ICD-10-CM

## 2022-04-10 DIAGNOSIS — M5459 Other low back pain: Secondary | ICD-10-CM

## 2022-04-10 DIAGNOSIS — M868X8 Other osteomyelitis, other site: Secondary | ICD-10-CM

## 2022-04-10 DIAGNOSIS — R5381 Other malaise: Secondary | ICD-10-CM

## 2022-04-10 NOTE — Therapy (Signed)
OUTPATIENT PHYSICAL THERAPY TREATMENT NOTE   Patient Name: Haley Singh MRN: [id number] DOB:1950/08/19, 71 y.o., female Today's Date: 04/10/2022  PCP: Haley Mires, MD REFERRING PROVIDER: Gertie Gowda, DO  END OF SESSION:      PT End of Session - 04/10/22 1403     Visit Number 15    Number of Visits 16    Date for PT Re-Evaluation 04/17/22    Authorization Type [employer]    PT Start Time 1400    PT Stop Time 1440    PT Time Calculation (min) 40 min    Activity Tolerance Patient tolerated treatment well    Behavior During Therapy WFL for tasks assessed/performed            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   Hyperlipidemia    Hypertension    followed by pcp  (11-10-2019 pt stated never had a stress test)   Insomnia    MDD (major depressive disorder)    Mild persistent asthma    followed by pcp and dr Haley Singh (pulmonologist)   RLS (restless legs syndrome)    Vertebral osteomyelitis ([place]) 02/21/2021   Past Surgical History:  Procedure Laterality Date   BUBBLE STUDY  01/25/2021   Procedure: BUBBLE STUDY;  Surgeon: Haley Dresser, MD;  Location: [facility];  Service: Cardiovascular;;   CATARACT EXTRACTION W/ INTRAOCULAR LENS  IMPLANT, BILATERAL  2009; 2010   CYSTOSCOPY/URETEROSCOPY/HOLMIUM LASER/STENT PLACEMENT Right 08/26/2019   Procedure: CYSTOSCOPY/RETROGRADE/URETEROSCOPY/HOLMIUM LASER/STENT PLACEMENT;  Surgeon: Haley Mons, MD;  Location: [facility];  Service: Urology;  Laterality: Right;   CYSTOSCOPY/URETEROSCOPY/HOLMIUM LASER/STENT PLACEMENT Left 11/17/2019   Procedure: CYSTOSCOPY/URETEROSCOPY, RETROGRADE,LASER LITHOTRIPSY, STENT PLACEMENT;  Surgeon: Haley Mons, MD;  Location: [facility];  Service: Urology;  Laterality: Left;   CYSTOSCOPY/URETEROSCOPY/HOLMIUM LASER/STENT PLACEMENT Left 02/19/2021   Procedure: CYSTOSCOPY/URETEROSCOPY/HOLMIUM LASER/STENT PLACEMENT;  Surgeon: Haley Mallow, MD;  Location: [facility];  Service: Urology;  Laterality: Left;   EXPLORATORY LAPAROTOMY W/ BOWEL RESECTION  01-15-2004  _0    small bowel resection for stricture/ benzoar   EXTRACORPOREAL SHOCK WAVE LITHOTRIPSY Right 03/27/2018   Procedure: EXTRACORPOREAL SHOCK WAVE LITHOTRIPSY (ESWL);  Surgeon: Haley Hughs, MD;  Location: [facility];  Service: Urology;  Laterality: Right;   EXTRACORPOREAL SHOCK WAVE LITHOTRIPSY  x2 2008;  2009;  2015   HOLMIUM LASER APPLICATION Left 09/10/8117   Procedure: HOLMIUM LASER APPLICATION;  Surgeon: Haley Mons, MD;  Location: [facility];  Service: Urology;  Laterality: Left;   IR FLUORO GUIDED NEEDLE PLC ASPIRATION/INJECTION LOC  02/22/2021   ORIF ANKLE FRACTURE Right 09/ 2008 _1    per pt has retained hardware   TEE WITHOUT CARDIOVERSION N/A 01/25/2021   Procedure: TRANSESOPHAGEAL ECHOCARDIOGRAM (TEE);  Surgeon: Haley Dresser, MD;  Location: [facility];  Service: Cardiovascular;  Laterality: N/A;   TONSILLECTOMY  age 60   Patient Active Problem List   Diagnosis Date Noted   Back pain of lumbar region with sciatica 04/02/2022   Arthralgia 04/02/2022   Fatigue due to excessive exertion 01/31/2022   Weight gain 01/31/2022   Penicillin allergy    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   Vertebral osteomyelitis ([place])  02/21/2021   Intractable nausea and vomiting 02/18/2021   Hypokalemia 02/18/2021   MSSA bacteremia    Discitis of lumbar region    Osteomyelitis ([place]) 01/17/2021   Mild persistent asthma without complication 83/15/1761   OSA (obstructive sleep apnea) 03/03/2014    REFERRING DIAG: Y07.3XXA (ICD-10-CM) - Fatigue due to excessive exertion, initial  encounter M54.89 (ICD-10-CM) - Midline back pain, unspecified back location, unspecified chronicity   THERAPY DIAG:  Myalgia, unspecified site  Other low back pain  Physical deconditioning  Other osteomyelitis, other site [facility])  Rationale for Evaluation and Treatment Rehabilitation  PERTINENT HISTORY: Haley Singh is a 71 y/o female presenting to PM&R clinic as a referral from her PCP Dr. Reed Singh for pain s/p osteomyelitis . She states when they made the referral, she was in pain but that is no longer the case. She has persistent fatigue, which is the most bothersome thing for her. She also has occassional muscle spasms, but these self resolved.    Fatigue: Has been ongoing and debilitating over the past year. She has not gone to PT, had any medications, or other treatments specifically for fatigue. Does endorse family Hx hypothyroidism, but no personal Hx. Has had some mild weight gain with inactivity. She also endorses poor tolerance of hot weather, but denies any fatigue or pain associated with it. She denies any associated weakness, sensory changes, pain, diplopia or pruritis.    Pain: Her osteomyelitis was at L2-3, and she also has a bulging disc at L2. She was taking oxycodone at the time 2-3x daily in acute phase, but has been off of it since January 2023. She has been getting massages every few weeks to stretch her Psoas, which is helping with her posture.    Function/social: Lives in a 3 story town home by [employer]. Used to work part time at [employer], currently unemployed. She used to do woodworking and photography, but does not anymore because heat tends to make her fatigue worse. She used to exercise in her complex pool, but has also stopped doing that due to heat. She is independent of mobility and ADLs.    She is on venlafaxane for anger and it is very effect.  PRECAUTIONS: Other: osteomyelitis  SUBJECTIVE: Has doubled her Celebrex dose and reports symptoms have decreased.   Discussed taking it 2x/day as opposed to 1x to provide a more consistent level in her system over time   PAIN:  Are you having pain? No   OBJECTIVE: (objective measures completed at initial evaluation unless otherwise dated)   DIAGNOSTIC FINDINGS:  EXAM: MRI LUMBAR SPINE WITHOUT CONTRAST   TECHNIQUE: Multiplanar, multisequence MR imaging of the lumbar spine was performed. No intravenous contrast was administered.   COMPARISON:  MR lumbar 04/09/2021 and 02/20/2021; CT lumbar 01/17/2021; X-ray lumbar 05/23/2011.   FINDINGS: Despite efforts by the technologist and patient, mild motion artifact is present on today's exam and could not be eliminated. This reduces exam sensitivity and specificity.   Segmentation: Conventional anatomy assumed, with the last open disc space designated L5-S1.Concordant with previous imaging.   Alignment: Stable chronic degenerative grade 1 anterolisthesis at L4-5 and mild convex right scoliosis. Mild retrolisthesis at L1-2 is unchanged.   Vertebrae: Again demonstrated are chronic sequela of discitis and osteomyelitis at L2-3 with partial collapse of the L2 and L3 vertebral bodies and irregularity of the intervening disc space. The associated marrow edema has improved, and no progressive bone destruction identified. Stable chronic endplate degenerative changes at L1-2. No new foci  of osteomyelitis identified. Stable mild degenerative changes of the sacroiliac joints.   Conus medullaris: Extends to the L1-2 level and appears normal. Stable sacral Tarlov cysts.   Paraspinal and other soft tissues: Improved paraspinal inflammatory changes at L2-3. No focal paraspinal or epidural fluid collection.   Disc levels:   No significant disc space findings from T9-10 through T12-L1 aside from anterior osteophytes and scattered perineural cysts.   L1-2: Stable chronic degenerative disc disease with loss of disc height, annular disc bulging and endplate  osteophytes. There is moderate facet and ligamentous hypertrophy contributing to stable mild spinal stenosis and mild narrowing of the lateral recesses and foramina bilaterally.   L2-3: As above, improving changes of diskitis/osteomyelitis. Stable annular disc bulging, endplate osteophytes, facet and ligamentous hypertrophy contributing to mild-to-moderate spinal stenosis. There is stable lateral recess and foraminal narrowing bilaterally. No epidural fluid collection identified.   L3-4: Stable annular disc bulging, facet and ligamentous hypertrophy contributing to mild spinal stenosis.   L4-5: Chronic loss of disc height with annular disc bulging and advanced facet hypertrophy, accounting for the grade 1 anterolisthesis. Stable mild foraminal narrowing bilaterally.   L5-S1: Stable chronic degenerative disc disease with posterior osteophytes and mild facet hypertrophy. No spinal stenosis or nerve root encroachment.   IMPRESSION: 1. Further improvement in the bone marrow edema and paraspinal inflammatory changes associated with previously demonstrated L2-3 discitis and osteomyelitis. No progressive vertebral body collapse or recurrent fluid collection identified. 2. No new findings. 3. Stable multilevel spondylosis as detailed above.     Electronically Signed   By: Richardean Sale M.D.   On: 07/04/2021 10:51   PATIENT SURVEYS:  FOTO 55(64 predicted); 03/13/22 45; 03/27/22 42%   SCREENING FOR RED FLAGS: negative   COGNITION:           Overall cognitive status: Within functional limits for tasks assessed                          SENSATION: Not tested   MUSCLE LENGTH: Hamstrings: Right 90 deg; Left 90 deg Thomas test: -20d from neutral B   POSTURE:  patient demos a forward flexed posture   PALPATION: Not tested   LUMBAR ROM:    Active  A/PROM  eval 04/03/22  Flexion WNL WFL  Extension -25% from neutral -25% from neutral  Right lateral flexion     Left  lateral flexion     Right rotation     Left rotation      (Blank rows = not tested)   LOWER EXTREMITY ROM:      Active  Right eval Left eval PROM L 04/03/22 PROM R 04/03/22  Hip flexion -20d -20d -30d -15d  Hip extension        Hip abduction        Hip adduction        Hip internal rotation        Hip external rotation        Knee flexion        Knee extension        Ankle dorsiflexion        Ankle plantarflexion        Ankle inversion        Ankle eversion         (Blank rows = not tested)   LOWER EXTREMITY MMT:     MMT Right eval Left eval  Hip flexion  4  4-  Hip extension  Hip abduction  3+  3+  Hip adduction      Hip internal rotation      Hip external rotation      Knee flexion      Knee extension      Ankle dorsiflexion      Ankle plantarflexion      Ankle inversion      Ankle eversion      Core/trunk flexion 3 3   (Blank rows = not tested)   LUMBAR SPECIAL TESTS:  Straight leg raise test: Negative, Slump test: Negative, Single leg stance test: Negative, FABER test: Positive, and Thomas test: Positive   FUNCTIONAL TESTS:  5 times sit to stand: 16s with arms crossed   GAIT: Distance walked: 40f x2 Assistive device utilized: None Level of assistance: Complete Independence Comments: antalgic    [facility] Adult PT Treatment:                                                DATE: 04/10/22 Therapeutic Exercise: Curl ups 15x SLR 2# 15/15 Bridge w/ball 15x Supine alternating march 15/15 2# Manual hip flexor stretch in prone 30s x2 B Nustep L5 8 min PPT 3s hold 15x Prone quad stretch 30s x2 B 15 PKBs B Seated hamstring stretch 30s 2 B STS w/OH reach 10x  [facility] Adult PT Treatment:                                                DATE: [address]/05/2022 [address] therapeutic pool temp 92 degrees Pt enters building ambulating with SPC. Treatment took place in water 3.8 to  4 ft 8 in.feet deep depending upon activity.   Pt entered and exited the pool via stair and handrails independently   Therapeutic Exercise: Walking forward/backwards/side stepping x 3 laps each Marching walk x3 laps with rainbow dumbbells Side stepping with shoulder ab/adduction colorful dumbbells x3 laps Runners stretch on bottom step x30" BIL Hamstring stretch on bottom step x30" BIL Hip flexor stretch - 45'' Figure 4 squat stretch, BIL UE support x30" BIL Riding yellow pool noodle and breast stroke across pool - 2 laps Sit to stand - 3x10 - step 4  At with yellow DB pt performed LE exercise: Hip abd/add 1x20 BIL Noodle stomp - yellow - 2x20 ea (NT) Hip ext/flex with knee straight 1x 20 BIL Squat 2x20 Walking lunge - 2 laps  Pt requires the buoyancy of water for active assisted exercises with buoyancy supported for strengthening and AROM exercises. Hydrostatic pressure also supports joints by unweighting joint load by at least 50 % in 3-4 feet depth water. 80% in chest to neck deep water. Water will provide assistance with movement using the current and laminar flow while the buoyancy reduces weight bearing. Pt requires the viscosity of the water for resistance with strengthening exercises.   TODAY'S TREATMENT  [facility] Adult PT Treatment:                                                DATE: 03/27/22 Therapeutic Exercise: Curl ups 15x  Bridge w/ball 15x Bridge 15x Supine alternating march 15/15 Manual hip flexor stretch in prone 30s x1 B Nustep L4 8 min PPT 3s hold 15x Prone quad stretch 30s x2 B 15 PKBs in b/t stretches Seated hamstring stretch 30s x1 B  HOME EXERCISE PROGRAM: Access Code: [email or web address] URL: [email or web address] Date: 02/27/2022 Prepared by: Sharlynn Oliphant  Exercises - Hip Flexor Stretch at [facility] of Bed  - 2 x daily - 5 x weekly - 1 sets - 3 reps - 30s hold - Supine Posterior Pelvic Tilt  - 2 x daily - 5 x weekly - 1 sets - 15 reps - Supine Bridge  - 2 x daily - 5 x weekly - 1 sets - 15 reps -  Sidelying Open Book Thoracic Lumbar Rotation and Extension  - 2 x daily - 5 x weekly - 1 sets - 10 reps   ASSESSMENT:   CLINICAL IMPRESSION: Has noted a decrease in symptoms with an increase in her anti-inflammatory meds. Today's session continued to focus on hip flexor stretch and strength as well as core strengthening.  Increased weight and resistance as noted.  Added activities to promote hip and posterior chain extension tasks to allow for hip flexor stretch and inhibition.   OBJECTIVE IMPAIRMENTS Abnormal gait, decreased activity tolerance, decreased balance, decreased endurance, decreased knowledge of condition, decreased mobility, difficulty walking, decreased ROM, increased muscle spasms, improper body mechanics, postural dysfunction, and pain.    ACTIVITY LIMITATIONS carrying, lifting, standing, and bed mobility   PERSONAL FACTORS Age, Past/current experiences, Time since onset of injury/illness/exacerbation, and 1 comorbidity: osteomyelitis  are also affecting patient's functional outcome.    REHAB POTENTIAL: Good   CLINICAL DECISION MAKING: Evolving/moderate complexity   EVALUATION COMPLEXITY: Moderate     GOALS: Goals reviewed with patient? Yes   SHORT TERM GOALS: Target date: 03/13/2022   Patient to demonstrate independence in HEP  Baseline:[id number] Goal status: Met   2.  Increase B hip extension to -10d from neutral Baseline: -20d from neutral; 03/13/22 -10d from neutral observed during treatment. Goal status: Met   3.  Patient to perform 10 curl ups Baseline: 1 performed at eval; 03/13/22 10-15 reps Goal status: Met       LONG TERM GOALS: Target date: 04/03/2022   0d hip extension  Baseline: -20d from neutral; 03/27/22 -25d B Goal status: Ongoing   2.  Decrease 5x STS time to <15s Baseline: 16s arms crossed; 03/27/22 18s Goal status: INITIAL   3.  Increase core strength to 3+/5 Baseline: 3/5 as evidenced by inability to perform curl up Goal status:  Ongoing   4.  Increase FOTO score to 64 Baseline: 55; 10/10 45; 03/27/22 42 Goal status: Ongoing         PLAN: PT FREQUENCY: 2x/week   PT DURATION: 6 weeks   PLANNED INTERVENTIONS: Therapeutic exercises, Therapeutic activity, Neuromuscular re-education, Balance training, Gait training, Patient/Family education, Self Care, Joint mobilization, Stair training, DME instructions, Aquatic Therapy, Dry Needling, Spinal mobilization, Manual therapy, and Re-evaluation.   PLAN FOR NEXT SESSION: HEP review and update, core strength, hip stretch, body mechanics training, review benefit of stretching, f/u on 10/30 MD visit    Lanice Shirts, PT 04/10/2022, 2:10 PM

## 2022-04-13 ENCOUNTER — Ambulatory Visit (HOSPITAL_BASED_OUTPATIENT_CLINIC_OR_DEPARTMENT_OTHER)
Admission: RE | Admit: 2022-04-13 | Discharge: 2022-04-13 | Disposition: A | Discharge status: Home / Self Care | Payer: Medicare Other | Source: Ambulatory Visit | Attending: Physical Medicine and Rehabilitation | Admitting: Physical Medicine and Rehabilitation

## 2022-04-13 DIAGNOSIS — M5440 Lumbago with sciatica, unspecified side: Secondary | ICD-10-CM

## 2022-04-13 DIAGNOSIS — M4646 Discitis, unspecified, lumbar region: Secondary | ICD-10-CM

## 2022-04-16 ENCOUNTER — Ambulatory Visit: Payer: Medicare Other

## 2022-04-16 DIAGNOSIS — R5381 Other malaise: Secondary | ICD-10-CM

## 2022-04-16 DIAGNOSIS — M5459 Other low back pain: Secondary | ICD-10-CM | POA: Diagnosis not present

## 2022-04-16 DIAGNOSIS — M791 Myalgia, unspecified site: Secondary | ICD-10-CM

## 2022-04-16 NOTE — Therapy (Addendum)
OUTPATIENT PHYSICAL THERAPY TREATMENT NOTE   Patient Name: Haley Singh MRN: [id number] DOB:Mar 06, 1951, 71 y.o., female Today's Date: 04/16/2022  PCP: Katherina Mires, MD REFERRING PROVIDER: Gertie Gowda, DO  END OF SESSION:      PT End of Session - 04/16/22 1353     Visit Number 16    Number of Visits 20    Date for PT Re-Evaluation 06/17/22    Authorization Type [employer]    PT Start Time 1400    PT Stop Time 1440    PT Time Calculation (min) 40 min    Activity Tolerance Patient tolerated treatment well    Behavior During Therapy WFL for tasks assessed/performed            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   Hyperlipidemia    Hypertension    followed by pcp  (11-10-2019 pt stated never had a stress test)   Insomnia    MDD (major depressive disorder)    Mild persistent asthma    followed by pcp and dr Bernita Raisin (pulmonologist)   RLS (restless legs syndrome)    Vertebral osteomyelitis ([place]) 02/21/2021   Past Surgical History:  Procedure Laterality Date   BUBBLE STUDY  01/25/2021   Procedure: BUBBLE STUDY;  Surgeon: Buford Dresser, MD;  Location: [facility];  Service: Cardiovascular;;   CATARACT EXTRACTION W/ INTRAOCULAR LENS  IMPLANT, BILATERAL  2009; 2010   CYSTOSCOPY/URETEROSCOPY/HOLMIUM LASER/STENT PLACEMENT Right 08/26/2019   Procedure: CYSTOSCOPY/RETROGRADE/URETEROSCOPY/HOLMIUM LASER/STENT PLACEMENT;  Surgeon: Ceasar Mons, MD;  Location: [facility];  Service: Urology;  Laterality: Right;   CYSTOSCOPY/URETEROSCOPY/HOLMIUM LASER/STENT PLACEMENT Left 11/17/2019   Procedure: CYSTOSCOPY/URETEROSCOPY, RETROGRADE,LASER LITHOTRIPSY, STENT PLACEMENT;  Surgeon: Ceasar Mons, MD;  Location: [facility];  Service: Urology;  Laterality: Left;   CYSTOSCOPY/URETEROSCOPY/HOLMIUM LASER/STENT PLACEMENT Left 02/19/2021   Procedure: CYSTOSCOPY/URETEROSCOPY/HOLMIUM LASER/STENT PLACEMENT;  Surgeon: Lucas Mallow, MD;  Location: [facility];  Service: Urology;  Laterality: Left;   EXPLORATORY LAPAROTOMY W/ BOWEL RESECTION  01-15-2004  _0    small bowel resection for stricture/ benzoar   EXTRACORPOREAL SHOCK WAVE LITHOTRIPSY Right 03/27/2018   Procedure: EXTRACORPOREAL SHOCK WAVE LITHOTRIPSY (ESWL);  Surgeon: Ardis Hughs, MD;  Location: [facility];  Service: Urology;  Laterality: Right;   EXTRACORPOREAL SHOCK WAVE LITHOTRIPSY  x2 2008;  2009;  2015   HOLMIUM LASER APPLICATION Left 4/66/5993   Procedure: HOLMIUM LASER APPLICATION;  Surgeon: Ceasar Mons, MD;  Location: [facility];  Service: Urology;  Laterality: Left;   IR FLUORO GUIDED NEEDLE PLC ASPIRATION/INJECTION LOC  02/22/2021   ORIF ANKLE FRACTURE Right 09/ 2008 _1    per pt has retained hardware   TEE WITHOUT CARDIOVERSION N/A 01/25/2021   Procedure: TRANSESOPHAGEAL ECHOCARDIOGRAM (TEE);  Surgeon: Buford Dresser, MD;  Location: [facility];  Service: Cardiovascular;  Laterality: N/A;   TONSILLECTOMY  age 47   Patient Active Problem List   Diagnosis Date Noted   Back pain of lumbar region with sciatica 04/02/2022   Arthralgia 04/02/2022   Fatigue due to excessive exertion 01/31/2022   Weight gain 01/31/2022   Penicillin allergy    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   Vertebral osteomyelitis ([place])  02/21/2021   Intractable nausea and vomiting 02/18/2021   Hypokalemia 02/18/2021   MSSA bacteremia    Discitis of lumbar region    Osteomyelitis ([place]) 01/17/2021   Mild persistent asthma without complication 92/42/6834   OSA (obstructive sleep apnea) 03/03/2014    REFERRING DIAG: H96.3XXA (ICD-10-CM) - Fatigue due to excessive exertion, initial  encounter M54.89 (ICD-10-CM) - Midline back pain, unspecified back location, unspecified chronicity   THERAPY DIAG:  Myalgia, unspecified site - Plan: PT plan of care cert/re-cert  Other low back pain - Plan: PT plan of care cert/re-cert  Physical deconditioning - Plan: PT plan of care cert/re-cert  Rationale for Evaluation and Treatment Rehabilitation  PERTINENT HISTORY: [name] is a 71 y/o female presenting to PM&R clinic as a referral from her PCP Dr. Reed Breech for pain s/p osteomyelitis . She states when they made the referral, she was in pain but that is no longer the case. She has persistent fatigue, which is the most bothersome thing for her. She also has occassional muscle spasms, but these self resolved.    Fatigue: Has been ongoing and debilitating over the past year. She has not gone to PT, had any medications, or other treatments specifically for fatigue. Does endorse family Hx hypothyroidism, but no personal Hx. Has had some mild weight gain with inactivity. She also endorses poor tolerance of hot weather, but denies any fatigue or pain associated with it. She denies any associated weakness, sensory changes, pain, diplopia or pruritis.    Pain: Her osteomyelitis was at L2-3, and she also has a bulging disc at L2. She was taking oxycodone at the time 2-3x daily in acute phase, but has been off of it since January 2023. She has been getting massages every few weeks to stretch her Psoas, which is helping with her posture.    Function/social: Lives in a 3 story town home by [employer]. Used to work part time at [employer], currently unemployed. She used to do woodworking and photography, but does not anymore because heat tends to make her fatigue worse. She used to exercise in her complex pool, but has also stopped doing that due to heat. She is independent of mobility and ADLs.    She is on venlafaxane for anger and it is very effect.  PRECAUTIONS: Other: osteomyelitis  SUBJECTIVE:  Reports walking is "better".  Is now able to manage lifting heavier objects with less discomfort such as taking out the trash.     PAIN:  Are you having pain? No   OBJECTIVE: (objective measures completed at initial evaluation unless otherwise dated)   DIAGNOSTIC FINDINGS:  EXAM: MRI LUMBAR SPINE WITHOUT CONTRAST   TECHNIQUE: Multiplanar, multisequence MR imaging of the lumbar spine was performed. No intravenous contrast was administered.   COMPARISON:  MR lumbar 04/09/2021 and 02/20/2021; CT lumbar 01/17/2021; X-ray lumbar 05/23/2011.   FINDINGS: Despite efforts by the technologist and patient, mild motion artifact is present on today's exam and could not be eliminated. This reduces exam sensitivity and specificity.   Segmentation: Conventional anatomy assumed, with the last open disc space designated L5-S1.Concordant with previous imaging.   Alignment: Stable chronic degenerative grade 1 anterolisthesis at L4-5 and mild convex right scoliosis. Mild retrolisthesis at L1-2 is unchanged.   Vertebrae: Again demonstrated are chronic sequela of discitis and osteomyelitis at L2-3 with partial collapse of the L2 and L3 vertebral bodies and irregularity of the intervening disc space. The associated marrow edema has improved, and no progressive bone destruction identified. Stable  chronic endplate degenerative changes at L1-2. No new foci of osteomyelitis identified. Stable mild degenerative changes of the sacroiliac joints.   Conus medullaris: Extends to the L1-2 level and appears normal. Stable sacral Tarlov cysts.   Paraspinal and other soft tissues: Improved paraspinal inflammatory changes at L2-3. No focal paraspinal or epidural fluid collection.   Disc levels:   No significant disc space findings from T9-10 through T12-L1 aside from anterior osteophytes and scattered perineural cysts.   L1-2: Stable chronic degenerative disc disease with loss of disc height, annular  disc bulging and endplate osteophytes. There is moderate facet and ligamentous hypertrophy contributing to stable mild spinal stenosis and mild narrowing of the lateral recesses and foramina bilaterally.   L2-3: As above, improving changes of diskitis/osteomyelitis. Stable annular disc bulging, endplate osteophytes, facet and ligamentous hypertrophy contributing to mild-to-moderate spinal stenosis. There is stable lateral recess and foraminal narrowing bilaterally. No epidural fluid collection identified.   L3-4: Stable annular disc bulging, facet and ligamentous hypertrophy contributing to mild spinal stenosis.   L4-5: Chronic loss of disc height with annular disc bulging and advanced facet hypertrophy, accounting for the grade 1 anterolisthesis. Stable mild foraminal narrowing bilaterally.   L5-S1: Stable chronic degenerative disc disease with posterior osteophytes and mild facet hypertrophy. No spinal stenosis or nerve root encroachment.   IMPRESSION: 1. Further improvement in the bone marrow edema and paraspinal inflammatory changes associated with previously demonstrated L2-3 discitis and osteomyelitis. No progressive vertebral body collapse or recurrent fluid collection identified. 2. No new findings. 3. Stable multilevel spondylosis as detailed above.     Electronically Signed   By: Richardean Sale M.D.   On: 07/04/2021 10:51   PATIENT SURVEYS:  FOTO 55(64 predicted); 03/13/22 45; 03/27/22 42%   SCREENING FOR RED FLAGS: negative   COGNITION:           Overall cognitive status: Within functional limits for tasks assessed                          SENSATION: Not tested   MUSCLE LENGTH: Hamstrings: Right 90 deg; Left 90 deg Thomas test: -20d from neutral B   POSTURE:  patient demos a forward flexed posture   PALPATION: Not tested   LUMBAR ROM:    Active  A/PROM  eval 04/03/22  Flexion WNL WFL  Extension -25% from neutral -25% from neutral  Right  lateral flexion     Left lateral flexion     Right rotation     Left rotation      (Blank rows = not tested)   LOWER EXTREMITY ROM:      Active  Right eval Left eval PROM L 04/03/22 PROM R 04/03/22 PROM L 04/16/22 PROM R 04/16/22  Hip flexion -20d -20d -30d -15d -23d -18d  Hip extension          Hip abduction          Hip adduction          Hip internal rotation          Hip external rotation          Knee flexion          Knee extension          Ankle dorsiflexion          Ankle plantarflexion          Ankle inversion          Ankle eversion           (  Blank rows = not tested)   LOWER EXTREMITY MMT:     MMT Right eval Left eval R 04/16/22 L 04/16/22  Hip flexion  4  4- 4+ 4  Hip extension        Hip abduction  3+  3+ 4+ 4+  Hip adduction        Hip internal rotation        Hip external rotation        Knee flexion        Knee extension        Ankle dorsiflexion        Ankle plantarflexion        Ankle inversion        Ankle eversion        Core/trunk flexion 3 3 3+ 3+   (Blank rows = not tested)   LUMBAR SPECIAL TESTS:  Straight leg raise test: Negative, Slump test: Negative, Single leg stance test: Negative, FABER test: Positive, and Thomas test: Positive   FUNCTIONAL TESTS:  5 times sit to stand: 16s with arms crossed   GAIT: Distance walked: 71f x2 Assistive device utilized: None Level of assistance: Complete Independence Comments: antalgic   [facility] Adult PT Treatment:                                                DATE: 04/16/22 Therapeutic Exercise: Curl ups 15x Sidelie abd 15x B Manual hip flexor stretch in prone 30s x2 B Nustep L6 6 min PKBs B 30s x3 w/strap Seated hamstring stretch 30s 2 B  [facility] Adult PT Treatment:                                                DATE: 04/10/22 Therapeutic Exercise: Curl ups 15x SLR 2# 15/15 Bridge w/ball 15x Supine alternating march 15/15 2# Manual hip flexor stretch in prone 30s x2 B Nustep L5 8  min PPT 3s hold 15x Prone quad stretch 30s x2 B 15 PKBs B Seated hamstring stretch 30s 2 B STS w/OH reach 10x  [facility] Adult PT Treatment:                                                DATE: 04/05/2022 [address] therapeutic pool temp 92 degrees Pt enters building ambulating with SPC. Treatment took place in water 3.8 to  4 ft 8 in.feet deep depending upon activity.  Pt entered and exited the pool via stair and handrails independently   Therapeutic Exercise: Walking forward/backwards/side stepping x 3 laps each Marching walk x3 laps with rainbow dumbbells Side stepping with shoulder ab/adduction colorful dumbbells x3 laps Runners stretch on bottom step x30" BIL Hamstring stretch on bottom step x30" BIL Hip flexor stretch - 45'' Figure 4 squat stretch, BIL UE support x30" BIL Riding yellow pool noodle and breast stroke across pool - 2 laps Sit to stand - 3x10 - step 4  At with yellow DB pt performed LE exercise: Hip abd/add 1x20 BIL Noodle stomp - yellow - 2x20 ea (NT) Hip ext/flex with  knee straight 1x 20 BIL Squat 2x20 Walking lunge - 2 laps  Pt requires the buoyancy of water for active assisted exercises with buoyancy supported for strengthening and AROM exercises. Hydrostatic pressure also supports joints by unweighting joint load by at least 50 % in 3-4 feet depth water. 80% in chest to neck deep water. Water will provide assistance with movement using the current and laminar flow while the buoyancy reduces weight bearing. Pt requires the viscosity of the water for resistance with strengthening exercises.    HOME EXERCISE PROGRAM: Access Code: [email or web address] URL: [email or web address] Date: 02/27/2022 Prepared by: Sharlynn Oliphant  Exercises - Hip Flexor Stretch at [facility] of Bed  - 2 x daily - 5 x weekly - 1 sets - 3 reps - 30s hold - Supine Posterior Pelvic Tilt  - 2 x daily - 5 x weekly - 1 sets - 15 reps - Supine Bridge  - 2 x  daily - 5 x weekly - 1 sets - 15 reps - Sidelying Open Book Thoracic Lumbar Rotation and Extension  - 2 x daily - 5 x weekly - 1 sets - 10 reps   ASSESSMENT:   CLINICAL IMPRESSION: B hip PROM unchanged as patient continues with marked hip flexor/psoas tightness, strength gains noted in hip flexion and core strength.  Todays session reassessed goals and HEP, adding to program as noted.  Instructed in additional stretches for hip flexors and quads.  Prone hip IR/ER motion minimally restricted, suggesting possible hip capsule restrictions.  Recommend continue 1w4 until next MD f/u   OBJECTIVE IMPAIRMENTS Abnormal gait, decreased activity tolerance, decreased balance, decreased endurance, decreased knowledge of condition, decreased mobility, difficulty walking, decreased ROM, increased muscle spasms, improper body mechanics, postural dysfunction, and pain.    ACTIVITY LIMITATIONS carrying, lifting, standing, and bed mobility   PERSONAL FACTORS Age, Past/current experiences, Time since onset of injury/illness/exacerbation, and 1 comorbidity: osteomyelitis  are also affecting patient's functional outcome.    REHAB POTENTIAL: Good   CLINICAL DECISION MAKING: Evolving/moderate complexity   EVALUATION COMPLEXITY: Moderate     GOALS: Goals reviewed with patient? Yes   SHORT TERM GOALS: Target date: 03/13/2022   Patient to demonstrate independence in HEP  Baseline:[id number] Goal status: Met   2.  Increase B hip extension to -10d from neutral Baseline: -20d from neutral; 03/13/22 -10d from neutral observed during treatment. Goal status: Met   3.  Patient to perform 10 curl ups Baseline: 1 performed at eval; 03/13/22 10-15 reps Goal status: Met       LONG TERM GOALS: Target date: 06/22/2021   0d hip extension  Baseline: -20d from neutral; 03/27/22 -25d B; 04/16/22 -18d R, -23d L Goal status: Ongoing   2.  Decrease 5x STS time to <15s Baseline: 16s arms crossed; 03/27/22 18s 04/16/22  13s  Goal status: Met   3.  Increase core strength to 3+/5 Baseline: 3/5 as evidenced by inability to perform curl up Goal status: Ongoing   4.  Increase FOTO score to 64 Baseline: 55; 10/10 45; 03/27/22 42 Goal status: Not met         PLAN: PT FREQUENCY: 1x/week   PT DURATION: 4 weeks   PLANNED INTERVENTIONS: Therapeutic exercises, Therapeutic activity, Neuromuscular re-education, Balance training, Gait training, Patient/Family education, Self Care, Joint mobilization, Stair training, DME instructions, Aquatic Therapy, Dry Needling, Spinal mobilization, Manual therapy, and Re-evaluation.   PLAN FOR NEXT SESSION: HEP review and update, core strength, hip stretch, body mechanics training, review benefit of stretching, f/u on  MRI    Lanice Shirts, PT 04/16/2022, 3:46 PM

## 2022-04-17 ENCOUNTER — Telehealth: Payer: Self-pay | Admitting: Physical Medicine and Rehabilitation

## 2022-04-17 NOTE — Telephone Encounter (Signed)
Reviewed MRI Lumbar spine independently and compared to prior MRI lumbar spine 06/2021. Significant findings/changes since prior MRI include:  L2-3: Prominent endplate osteophytic ridging, advanced hypertrophic facet degenerative changes and epidural lipomatosis resulting in moderate to severe spinal canal stenosis and severe bilateral neural foraminal narrowing, progressed when compared to prior MRI.   L3-4: Disc bulge with superimposed left central, superiorly migrating disc extrusion, moderate facet degenerative changes with bilateral joint effusion and epidural lipomatosis resulting in severe spinal canal stenosis and moderate bilateral neural foraminal narrowing progressed when compared to prior MRI.   Plan:  - L2-3 and L3-4 areas have progressed from mild/mild-to-moderate spinal stenosis to moderate/moderate-severe stenosis since last imaging 10 months prior.   - Given intermittent, positional lower extremity radicular symptoms, spasms, and ongoing ambulatory difficulty, feel patient would benefit from re-evaluation by Neurosurgery. Established with Dr. Barnett Abu of [facility] in [place], [place]; recommend return to his practice for opinion, will send referral.   - Called patient to review above recommendations; went to VM; left message for her to contact clinic or I will re-attempt call tomorrow afternoon   Angelina Sheriff, DO 04/17/2022

## 2022-04-18 ENCOUNTER — Encounter: Payer: Self-pay | Admitting: Physical Medicine and Rehabilitation

## 2022-04-23 ENCOUNTER — Encounter: Payer: Medicare Other | Attending: Physical Medicine & Rehabilitation | Admitting: Physical Medicine and Rehabilitation

## 2022-04-23 ENCOUNTER — Encounter: Payer: Self-pay | Admitting: Physical Medicine and Rehabilitation

## 2022-04-23 VITALS — BP 139/81 | HR 94 | Ht 66.0 in | Wt 220.6 lb

## 2022-04-23 DIAGNOSIS — M48061 Spinal stenosis, lumbar region without neurogenic claudication: Secondary | ICD-10-CM | POA: Diagnosis not present

## 2022-04-23 DIAGNOSIS — R1032 Left lower quadrant pain: Secondary | ICD-10-CM | POA: Diagnosis not present

## 2022-04-23 DIAGNOSIS — M5440 Lumbago with sciatica, unspecified side: Secondary | ICD-10-CM | POA: Diagnosis not present

## 2022-04-23 MED ORDER — TIZANIDINE HCL 4 MG PO TABS: 4.0000 mg | ORAL_TABLET | Freq: Three times a day (TID) | ORAL | 0 refills | Status: DC | PRN

## 2022-04-23 NOTE — Patient Instructions (Addendum)
I will send your MRI results to Dr. Danielle Dess and refer you back to their office to discuss current neuropathic pains, weakness,  and increased spinal stenosis seen on last MRI.   If those shooting pains become bothersome, you can take a 300 mg gabapentin for symptoms.  For your groin pain, continue as needed muscle relaxers (I have prescribed 30 tabs of Zanaflex 4 mg Q8H PRN), and consider gentle message, stretching, and heat to help relax the muscles. If there is no improvement in 2-3 weeks, call our office and we can get an xray of your left hip.  Follow up with me as scheduled 07/04/21.

## 2022-04-23 NOTE — Assessment & Plan Note (Addendum)
Advised heat and stretching for now.  Minimal benefit from PRN robaxin. Gave temporary script for Tizanidine 4 mg Q8H PRN #30 tabs R0  Advised if no improvement in 2-3 weeks, patient can call office and we can obtain L hip xrays. Low index of suspicion for fracture given nontraumatic onset (rolled in bed)

## 2022-04-23 NOTE — Progress Notes (Signed)
Subjective:    Patient ID: Haley Singh, female    DOB: 16-Dec-1950, 71 y.o.   MRN: [id number]  HPI Ms. Stickley is a 71 y/o female presenting to PM&R clinic as a follow up for pain s/p osteomyelitis, now intermittent and positional with occasional muscle spasms in legs, along with ongoing fatigue which is improving.  She presents today to review MRI results as below:    Reviewed MRI Lumbar spine independently and compared to prior MRI lumbar spine 06/2021. Significant findings/changes since prior MRI include:   L2-3: Prominent endplate osteophytic ridging, advanced hypertrophic facet degenerative changes and epidural lipomatosis resulting in moderate to severe spinal canal stenosis and severe bilateral neural foraminal narrowing, progressed when compared to prior MRI.   L3-4: Disc bulge with superimposed left central, superiorly migrating disc extrusion, moderate facet degenerative changes with bilateral joint effusion and epidural lipomatosis resulting in severe spinal canal stenosis and moderate bilateral neural foraminal narrowing progressed when compared to prior MRI.         Of note, last visit discussed possible alternate medications for chronic pain including tylenol,  tramadol, gabapentin, lyrica. Patient wished to defer at this time due to prior s/e or avoidance of habit-forming medication. Advised her to schedule follow up if she ever changes her mind and wishes to pursue this further.   Interval Hx:  - Intermittent "zips" of nerve pain down her legs brought on by sneezing, coughing, twisting in aquatherapy, and "when certain muscle engage". States walking does not bring it on. She does feel overall it improved for a short period, but now it is coming back more often. She denies any saddle anesthesia, bowel or bladder incontinence. She does have continued weakness and feels she cannot get out of a bathtub or up off the floor without assistance for >1 year.   - She currently  takes gabapentin for restless legs QHS only, has not noticed much benefit for neuropathic pain and does get a little drowsy with it.   - She states last Thursday she rolled the wrong way in bed and immediately felt pain in her left groin. She's been taking PRN robaxin up to 2 tabs daily without much benefit. She has not used heat for it, but has several heating pads.   - Patient has been looking to join [employer] to work on her leg strength.   Pain Inventory Average Pain 0 Pain Right Now 10 My pain is sharp  pain in left groin and left knee (pulled groin muscle  )  In the last 24 hours, has pain interfered with the following? General activity 10  current Relation with others 0 Enjoyment of life 0 What TIME of day is your pain at its worst? daytime Sleep (in general) Good  Pain is worse with: walking and some activites Pain improves with: rest Relief from Meds: 8  Family History  Problem Relation Age of Onset   Breast cancer Maternal Aunt    Heart disease Father    Heart Problems Father    Social History   Socioeconomic History   Marital status: Single    Spouse name: Not on file   Number of children: Not on file   Years of education: Not on file   Highest education level: Not on file  Occupational History   Occupation: Retired    Associate Professor: RETIRED  Tobacco Use   Smoking status: Never   Smokeless tobacco: Never  Vaping Use   Vaping Use: Never used  Substance and  Sexual Activity   Alcohol use: Yes    Alcohol/week: 7.0 standard drinks of alcohol    Types: 7 Standard drinks or equivalent per week    Comment: scotch or wine maybe 1 a night   Drug use: Never   Sexual activity: Not on file  Other Topics Concern   Not on file  Social History Narrative   Single, lives alone   No children   OCCUPATION: retired, IT support   Social Determinants of Corporate investment banker Strain: Not on file  Food Insecurity: Not on file  Transportation Needs: Not on file   Physical Activity: Not on file  Stress: Not on file  Social Connections: Not on file   Past Surgical History:  Procedure Laterality Date   BUBBLE STUDY  01/25/2021   Procedure: BUBBLE STUDY;  Surgeon: Jodelle Red, MD;  Location: [facility];  Service: Cardiovascular;;   CATARACT EXTRACTION W/ INTRAOCULAR LENS  IMPLANT, BILATERAL  2009; 2010   CYSTOSCOPY/URETEROSCOPY/HOLMIUM LASER/STENT PLACEMENT Right 08/26/2019   Procedure: CYSTOSCOPY/RETROGRADE/URETEROSCOPY/HOLMIUM LASER/STENT PLACEMENT;  Surgeon: Rene Paci, MD;  Location: [facility];  Service: Urology;  Laterality: Right;   CYSTOSCOPY/URETEROSCOPY/HOLMIUM LASER/STENT PLACEMENT Left 11/17/2019   Procedure: CYSTOSCOPY/URETEROSCOPY, RETROGRADE,LASER LITHOTRIPSY, STENT PLACEMENT;  Surgeon: Rene Paci, MD;  Location: [facility];  Service: Urology;  Laterality: Left;   CYSTOSCOPY/URETEROSCOPY/HOLMIUM LASER/STENT PLACEMENT Left 02/19/2021   Procedure: CYSTOSCOPY/URETEROSCOPY/HOLMIUM LASER/STENT PLACEMENT;  Surgeon: Crista Elliot, MD;  Location: [facility];  Service: Urology;  Laterality: Left;   EXPLORATORY LAPAROTOMY W/ BOWEL RESECTION  01-15-2004  @[facility]    small bowel resection for stricture/ benzoar   EXTRACORPOREAL SHOCK WAVE LITHOTRIPSY Right 03/27/2018   Procedure: EXTRACORPOREAL SHOCK WAVE LITHOTRIPSY (ESWL);  Surgeon: 03/29/2018, MD;  Location: [facility];  Service: Urology;  Laterality: Right;   EXTRACORPOREAL SHOCK WAVE LITHOTRIPSY  x2 2008;  2009;  2015   HOLMIUM LASER APPLICATION Left 11/17/2019   Procedure: HOLMIUM LASER APPLICATION;  Surgeon: 11/19/2019, MD;  Location: [facility];  Service: Urology;  Laterality: Left;   IR FLUORO GUIDED NEEDLE PLC ASPIRATION/INJECTION LOC  02/22/2021   ORIF ANKLE FRACTURE Right 09/ 2008 @[facility]    per pt has retained hardware   TEE WITHOUT CARDIOVERSION N/A 01/25/2021   Procedure: TRANSESOPHAGEAL  ECHOCARDIOGRAM (TEE);  Surgeon: , MD;  Location[facility];  Service: Cardiovascular;  Laterality: N/A;   TONSILLECTOMY  age 17   Past Surgical History:  Procedure Laterality Date   BUBBLE STUDY  01/25/2021   Procedure: BUBBLE STUDY;  Surgeon: 14, MD;  Location: [facility];  Service: Cardiovascular;;   CATARACT EXTRACTION W/ INTRAOCULAR LENS  IMPLANT, BILATERAL  2009; 2010   CYSTOSCOPY/URETEROSCOPY/HOLMIUM LASER/STENT PLACEMENT Right 08/26/2019   Procedure: CYSTOSCOPY/RETROGRADE/URETEROSCOPY/HOLMIUM LASER/STENT PLACEMENT;  Surgeon: 2011, MD;  Location: [facility];  Service: Urology;  Laterality: Right;   CYSTOSCOPY/URETEROSCOPY/HOLMIUM LASER/STENT PLACEMENT Left 11/17/2019   Procedure: CYSTOSCOPY/URETEROSCOPY, RETROGRADE,LASER LITHOTRIPSY, STENT PLACEMENT;  Surgeon: [facility] MD;  Location: [facility];  Service: Urology;  Laterality: Left;   CYSTOSCOPY/URETEROSCOPY/HOLMIUM LASER/STENT PLACEMENT Left 02/19/2021   Procedure: CYSTOSCOPY/URETEROSCOPY/HOLMIUM LASER/STENT PLACEMENT;  Surgeon: [facility] MD;  Location: [facility];  Service: Urology;  Laterality: Left;   EXPLORATORY LAPAROTOMY W/ BOWEL RESECTION  01-15-2004  @[facility]    small bowel resection for stricture/ benzoar   EXTRACORPOREAL SHOCK WAVE LITHOTRIPSY Right 03/27/2018   Procedure: EXTRACORPOREAL SHOCK WAVE LITHOTRIPSY (ESWL);  Surgeon: 01-17-2004, MD;  Location:  [facility];  Service: Urology;  Laterality: Right;   EXTRACORPOREAL SHOCK WAVE LITHOTRIPSY  x2 2008;  2009;  2015   HOLMIUM LASER APPLICATION Left 11/17/2019   Procedure: HOLMIUM LASER APPLICATION;  Surgeon: Rene PaciWinter, Christopher Aaron, MD;  [facility];  Service: Urology;  Laterality: Left;   IR FLUORO GUIDED NEEDLE PLC ASPIRATION/INJECTION LOC  02/22/2021   ORIF ANKLE FRACTURE Right 09/ 2008 @[facility]    per pt has retained hardware   TEE  WITHOUT CARDIOVERSION N/A 01/25/2021   Procedure: TRANSESOPHAGEAL ECHOCARDIOGRAM (TEE);  Surgeon: Jodelle Redhristopher, Bridgette, MD;  [facility];  Service: Cardiovascular;  Laterality: N/A;   TONSILLECTOMY  age 71   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 mannam (pulmonologist)   RLS (restless legs syndrome)    Vertebral osteomyelitis (HCC) 02/21/2021   BP 139/81   Pulse 94   Ht 5\' 6"  (1.676 m)   Wt 220 lb 9.6 oz (100.1 kg)   SpO2 96%   BMI 35.61 kg/m   Opioid Risk Score:   Fall Risk Score:  `1  Depression screen [facility]     04/23/2022   10:12 AM 04/02/2022    1:02 PM 01/31/2022    2:19 PM 10/04/2021    9:08 AM 08/02/2021    2:28 PM 03/10/2021   11:39 AM  Depression screen PHQ 2/9  Decreased Interest 0 0 0 0 0 0  Down, Depressed, Hopeless 0 0 0 0 0 0  PHQ - 2 Score 0 0 0 0 0 0  Altered sleeping   0     Tired, decreased energy   3     Change in appetite   0     Feeling bad or failure about yourself    0     Trouble concentrating   0     Moving slowly or fidgety/restless   0     Suicidal thoughts   0     PHQ-9 Score   3     Difficult doing work/chores   Somewhat difficult        Review of Systems  Constitutional: Negative.   HENT: Negative.    Eyes: Negative.   Respiratory: Negative.    Cardiovascular: Negative.   Gastrointestinal: Negative.   Endocrine: Negative.   Genitourinary: Negative.   Musculoskeletal:  Positive for gait problem and myalgias.  Skin: Negative.   Allergic/Immunologic: Negative.    Hematological: Negative.   Psychiatric/Behavioral: Negative.    All other systems reviewed and are negative.      Objective:   Physical Exam  Constitution: Appropriate appearance for age. No apparnet distress  HEENT: PERRL, EOMI grossly intact.  Resp: CTAB. No rales, rhonchi, or wheezing. Cardio: RRR. No mumurs, rubs, or gallops. No peripheral edema. Abdomen: Nondistended. Nontender. +bowel sounds. Psych: Appropriate mood and affect. Neuro: AAOx4. No apparent deficits. Sensation to light touch intact in bilateral lower extremities.  MSK: Strength testing limited  by pain in L hip/groin, 5/5 in knee extension, DF, and PF 5/5 throughout RLE.  Area of greatest pain in L anterior groin crease. Not worsened with palpation. Severe pain with passive hip abduction and external rotation; mild pain with internal rotation.  Antalgic gait favoring R foot. No ataxia. Forward lean, unchanged from prior. Walks with cane for stability.        Assessment & Plan:  Haley Singh is a 71 y/o female presenting to PM&R clinic as a follow up for pain s/p osteomyelitis, now intermittent and positional with occasional muscle spasms in legs, along with ongoing fatigue which is improving.  She presents today to review MRI results and discuss ongoing neuropathic LE pain and weakness, along with acute L groin pain.  Plan:   Spinal stenosis of lumbar region, unspecified whether neurogenic claudication present Assessment & Plan: L2-3 and L3-4 areas have progressed from mild/mild-to-moderate spinal stenosis to moderate/moderate-severe stenosis since last imaging 10 months prior.    Given intermittent, positional lower extremity radicular symptoms, spasms, and ongoing ambulatory difficulty/weakness, feel patient would benefit from re-evaluation by Neurosurgery. Established with Dr. Barnett Abu of [facility] in [place], [place]; recommend return to his practice for opinion, referral sent.    Orders: -      Ambulatory referral to Neurosurgery  Groin pain, left Assessment & Plan: Advised heat and stretching for now.  Minimal benefit from PRN robaxin. Gave temporary script for Tizanidine 4 mg Q8H PRN #30 tabs R0  Advised if no improvement in 2-3 weeks, patient can call office and we can obtain L hip xrays. Low index of suspicion for fracture given nontraumatic onset (rolled in bed)   Back pain of lumbar region with sciatica Assessment & Plan: Advised patient that she can take gabapentin 300 mg once daily PRN in addition to nighttime dose if "zinger" neuropathic pains re-occur.  Given no neurosurgical restrictions on activity after prior imaging and only significant change worsening spinal stenosis, advised patient that lower extremity exercises in a gym setting should be safe at this time. Did review increased pain, weakness, or ref flag symptoms as indication to stop.    Other orders -     tiZANidine HCl; Take 1 tablet (4 mg total) by mouth every 8 (eight) hours as needed for muscle spasms. Do not take concurrently with Robaxin  Dispense: 30 tablet; Refill: 0   Angelina Sheriff, DO 04/23/2022   .

## 2022-04-23 NOTE — Assessment & Plan Note (Signed)
L2-3 and L3-4 areas have progressed from mild/mild-to-moderate spinal stenosis to moderate/moderate-severe stenosis since last imaging 10 months prior.    Given intermittent, positional lower extremity radicular symptoms, spasms, and ongoing ambulatory difficulty/weakness, feel patient would benefit from re-evaluation by Neurosurgery. Established with Dr. Barnett Abu of [facility] in [place], [place]; recommend return to his practice for opinion, referral sent.

## 2022-04-23 NOTE — Assessment & Plan Note (Signed)
Advised patient that she can take gabapentin 300 mg once daily PRN in addition to nighttime dose if "zinger" neuropathic pains re-occur.  Given no neurosurgical restrictions on activity after prior imaging and only significant change worsening spinal stenosis, advised patient that lower extremity exercises in a gym setting should be safe at this time. Did review increased pain, weakness, or ref flag symptoms as indication to stop.

## 2022-04-24 ENCOUNTER — Ambulatory Visit: Payer: Medicare Other

## 2022-04-24 DIAGNOSIS — R5381 Other malaise: Secondary | ICD-10-CM

## 2022-04-24 DIAGNOSIS — M5459 Other low back pain: Secondary | ICD-10-CM | POA: Diagnosis not present

## 2022-04-24 DIAGNOSIS — M868X8 Other osteomyelitis, other site: Secondary | ICD-10-CM

## 2022-04-24 NOTE — Therapy (Signed)
OUTPATIENT PHYSICAL THERAPY TREATMENT NOTE   Patient Name: Haley Singh MRN: [id number] DOB:06-Sep-1950, 71 y.o., female Today's Date: 04/24/2022  PCP: Haley Mires, MD REFERRING PROVIDER: Gertie Gowda, DO  END OF SESSION:      PT End of Session - 04/24/22 1345     Visit Number 17    Number of Visits 20    Date for PT Re-Evaluation 06/17/22    Authorization Type [employer]    PT Start Time 8003    PT Stop Time 1425    PT Time Calculation (min) 40 min    Activity Tolerance Patient tolerated treatment well    Behavior During Therapy WFL for tasks assessed/performed            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   Hyperlipidemia    Hypertension    followed by pcp  (11-10-2019 pt stated never had a stress test)   Insomnia    MDD (major depressive disorder)    Mild persistent asthma    followed by pcp and dr Haley Singh (pulmonologist)   RLS (restless legs syndrome)    Vertebral osteomyelitis ([place]) 02/21/2021   Past Surgical History:  Procedure Laterality Date   BUBBLE STUDY  01/25/2021   Procedure: BUBBLE STUDY;  Surgeon: Haley Dresser, MD;  Location: [facility];  Service: Cardiovascular;;   CATARACT EXTRACTION W/ INTRAOCULAR LENS  IMPLANT, BILATERAL  2009; 2010   CYSTOSCOPY/URETEROSCOPY/HOLMIUM LASER/STENT PLACEMENT Right 08/26/2019   Procedure: CYSTOSCOPY/RETROGRADE/URETEROSCOPY/HOLMIUM LASER/STENT PLACEMENT;  Surgeon: Haley Mons, MD;  Location: [facility];  Service: Urology;  Laterality: Right;   CYSTOSCOPY/URETEROSCOPY/HOLMIUM LASER/STENT PLACEMENT Left 11/17/2019   Procedure: CYSTOSCOPY/URETEROSCOPY, RETROGRADE,LASER LITHOTRIPSY, STENT PLACEMENT;  Surgeon: Haley Mons, MD;  Location: [facility];  Service: Urology;  Laterality: Left;   CYSTOSCOPY/URETEROSCOPY/HOLMIUM LASER/STENT PLACEMENT Left 02/19/2021   Procedure: CYSTOSCOPY/URETEROSCOPY/HOLMIUM LASER/STENT PLACEMENT;  Surgeon: Haley Mallow, MD;  Location: [facility];  Service: Urology;  Laterality: Left;   EXPLORATORY LAPAROTOMY W/ BOWEL RESECTION  01-15-2004  _0    small bowel resection for stricture/ benzoar   EXTRACORPOREAL SHOCK WAVE LITHOTRIPSY Right 03/27/2018   Procedure: EXTRACORPOREAL SHOCK WAVE LITHOTRIPSY (ESWL);  Surgeon: Haley Hughs, MD;  Location: [facility];  Service: Urology;  Laterality: Right;   EXTRACORPOREAL SHOCK WAVE LITHOTRIPSY  x2 2008;  2009;  2015   HOLMIUM LASER APPLICATION Left 4/91/7915   Procedure: HOLMIUM LASER APPLICATION;  Surgeon: Haley Mons, MD;  Location: [facility];  Service: Urology;  Laterality: Left;   IR FLUORO GUIDED NEEDLE PLC ASPIRATION/INJECTION LOC  02/22/2021   ORIF ANKLE FRACTURE Right 09/ 2008 _1    per pt has retained hardware   TEE WITHOUT CARDIOVERSION N/A 01/25/2021   Procedure: TRANSESOPHAGEAL ECHOCARDIOGRAM (TEE);  Surgeon: Haley Dresser, MD;  Location: [facility];  Service: Cardiovascular;  Laterality: N/A;   TONSILLECTOMY  age 44   Patient Active Problem List   Diagnosis Date Noted   Groin pain, left 04/23/2022   Spinal stenosis of lumbar region 04/23/2022   Back pain of lumbar region with sciatica 04/02/2022   Arthralgia 04/02/2022   Fatigue due to excessive exertion 01/31/2022   Weight gain 01/31/2022  Penicillin allergy    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)  Rationale for Evaluation and Treatment Rehabilitation  PERTINENT HISTORY: Haley Singh is a 71 y/o female presenting to PM&R clinic as a referral from her PCP Dr. Reed Singh for pain s/p osteomyelitis . She states when they made the referral, she was in pain but that is no longer the case. She has persistent fatigue, which is the most bothersome thing for her. She also has occassional muscle spasms, but these self resolved.    Fatigue: Has been ongoing and debilitating over the past year. She has not gone to PT, had any medications, or other treatments specifically for fatigue. Does endorse family Hx hypothyroidism, but no personal Hx. Has had some mild weight gain with inactivity. She also endorses poor tolerance of hot weather, but denies any fatigue or pain associated with it. She denies any associated weakness, sensory changes, pain, diplopia or pruritis.    Pain: Her osteomyelitis was at L2-3, and she also has a bulging disc at L2. She was taking oxycodone at the time 2-3x daily in acute phase, but has been off of it since January 2023. She has been getting massages every few weeks to stretch her Psoas, which is helping with her posture.    Function/social: Lives in a 3 story town home by [employer]. Used to work part time at [employer], currently unemployed. She used to do woodworking and photography, but does not anymore because heat tends to make her fatigue worse. She used to exercise in her complex pool, but has also stopped doing that due to heat. She is independent of mobility and ADLs.    She is on venlafaxane for anger and it is very effect.  PRECAUTIONS: Other: osteomyelitis  SUBJECTIVE: MD appointment yesterday for  MRI results showing progression of degenerative changes and stenosis.  Will f/u with neurosurgical consult to determine need for surgery.  Recently strained L knee and groin on 04/19/22 and in unsure of activity tolerance today.     PAIN:  Are you having pain? No   OBJECTIVE: (objective measures completed at initial evaluation unless otherwise dated)   DIAGNOSTIC FINDINGS:  L2-3 and L3-4 areas have progressed from mild/mild-to-moderate spinal stenosis to moderate/moderate-severe stenosis since last imaging 10 months prior.    Given intermittent, positional lower extremity radicular symptoms, spasms, and ongoing ambulatory difficulty/weakness, feel patient would benefit from re-evaluation by Neurosurgery. Established with Dr. Kristeen Miss of [facility] in [place], [place]; recommend return to his practice for opinion, referral sent.   PATIENT SURVEYS:  FOTO 55(64 predicted); 03/13/22 45; 03/27/22 42%   SCREENING FOR RED FLAGS: negative   COGNITION:           Overall cognitive status: Within functional limits for tasks assessed                          SENSATION: Not tested   MUSCLE LENGTH: Hamstrings: Right 90 deg;  Left 90 deg Thomas test: -20d from neutral B   POSTURE:  patient demos a forward flexed posture   PALPATION: Not tested   LUMBAR ROM:    Active  A/PROM  eval 04/03/22  Flexion WNL WFL  Extension -25% from neutral -25% from neutral  Right lateral flexion     Left lateral flexion     Right rotation     Left rotation      (Blank rows = not tested)   LOWER EXTREMITY ROM:      Active  Right eval Left eval PROM L 04/03/22 PROM R 04/03/22 PROM L 04/16/22 PROM R 04/16/22  Hip flexion -20d -20d -30d -15d -23d -18d  Hip extension          Hip abduction          Hip adduction          Hip internal rotation          Hip external rotation          Knee flexion          Knee extension          Ankle dorsiflexion          Ankle plantarflexion           Ankle inversion          Ankle eversion           (Blank rows = not tested)   LOWER EXTREMITY MMT:     MMT Right eval Left eval R 04/16/22 L 04/16/22  Hip flexion  4  4- 4+ 4  Hip extension        Hip abduction  3+  3+ 4+ 4+  Hip adduction        Hip internal rotation        Hip external rotation        Knee flexion        Knee extension        Ankle dorsiflexion        Ankle plantarflexion        Ankle inversion        Ankle eversion        Core/trunk flexion 3 3 3+ 3+   (Blank rows = not tested)   LUMBAR SPECIAL TESTS:  Straight leg raise test: Negative, Slump test: Negative, Single leg stance test: Negative, FABER test: Positive, and Thomas test: Positive   FUNCTIONAL TESTS:  5 times sit to stand: 16s with arms crossed   GAIT: Distance walked: 79f x2 Assistive device utilized: None Level of assistance: Complete Independence Comments: antalgic   [facility] Adult PT Treatment:                                                DATE: 04/24/22 Therapeutic Exercise: Heel raises at wall 15x B ankle pumps in TKE 15/15 Seated hamstring stretch 30s 2 B Nustep L2 8 min FAQs B 15/15 Seated heel slides over towel 15/15    [facility] Adult PT Treatment:                                                DATE: 04/16/22 Therapeutic Exercise: Curl ups 15x Sidelie abd  15x B Manual hip flexor stretch in prone 30s x2 B Nustep L6 6 min PKBs B 30s x3 w/strap Seated hamstring stretch 30s 2 B  [facility] Adult PT Treatment:                                                DATE: 04/10/22 Therapeutic Exercise: Curl ups 15x SLR 2# 15/15 Bridge w/ball 15x Supine alternating march 15/15 2# Manual hip flexor stretch in prone 30s x2 B Nustep L5 8 min PPT 3s hold 15x Prone quad stretch 30s x2 B 15 PKBs B Seated hamstring stretch 30s 2 B STS w/OH reach 10x  [facility] Adult PT Treatment:                                                DATE: 04/05/2022 Aquatic therapy at [address] -  therapeutic pool temp 92 degrees Pt enters building ambulating with SPC. Treatment took place in water 3.8 to  4 ft 8 in.feet deep depending upon activity.  Pt entered and exited the pool via stair and handrails independently   Therapeutic Exercise: Walking forward/backwards/side stepping x 3 laps each Marching walk x3 laps with rainbow dumbbells Side stepping with shoulder ab/adduction colorful dumbbells x3 laps Runners stretch on bottom step x30" BIL Hamstring stretch on bottom step x30" BIL Hip flexor stretch - 45'' Figure 4 squat stretch, BIL UE support x30" BIL Riding yellow pool noodle and breast stroke across pool - 2 laps Sit to stand - 3x10 - step 4  At with yellow DB pt performed LE exercise: Hip abd/add 1x20 BIL Noodle stomp - yellow - 2x20 ea (NT) Hip ext/flex with knee straight 1x 20 BIL Squat 2x20 Walking lunge - 2 laps  Pt requires the buoyancy of water for active assisted exercises with buoyancy supported for strengthening and AROM exercises. Hydrostatic pressure also supports joints by unweighting joint load by at least 50 % in 3-4 feet depth water. 80% in chest to neck deep water. Water will provide assistance with movement using the current and laminar flow while the buoyancy reduces weight bearing. Pt requires the viscosity of the water for resistance with strengthening exercises.    HOME EXERCISE PROGRAM: Access Code: [email or web address] URL: [email or web address] Date: 02/27/2022 Prepared by: Sharlynn Oliphant  Exercises - Hip Flexor Stretch at [facility] of Bed  - 2 x daily - 5 x weekly - 1 sets - 3 reps - 30s hold - Supine Posterior Pelvic Tilt  - 2 x daily - 5 x weekly - 1 sets - 15 reps - Supine Bridge  - 2 x daily - 5 x weekly - 1 sets - 15 reps - Sidelying Open Book Thoracic Lumbar Rotation and Extension  - 2 x daily - 5 x weekly - 1 sets - 10 reps   ASSESSMENT:   CLINICAL IMPRESSION: Marked decline in ability to tolerate today's session due to L knee  pain.  Session tailored to accommodate symptoms.  Instructed patient to cancel last/next session if symptoms persist as she may need an ortho consult.  Awaiting neuro consult as well.   OBJECTIVE IMPAIRMENTS Abnormal gait, decreased activity tolerance, decreased balance, decreased endurance, decreased knowledge of condition, decreased mobility, difficulty walking, decreased ROM,  increased muscle spasms, improper body mechanics, postural dysfunction, and pain.    ACTIVITY LIMITATIONS carrying, lifting, standing, and bed mobility   PERSONAL FACTORS Age, Past/current experiences, Time since onset of injury/illness/exacerbation, and 1 comorbidity: osteomyelitis  are also affecting patient's functional outcome.    REHAB POTENTIAL: Good   CLINICAL DECISION MAKING: Evolving/moderate complexity   EVALUATION COMPLEXITY: Moderate     GOALS: Goals reviewed with patient? Yes   SHORT TERM GOALS: Target date: 03/13/2022   Patient to demonstrate independence in HEP  Baseline:[id number] Goal status: Met   2.  Increase B hip extension to -10d from neutral Baseline: -20d from neutral; 03/13/22 -10d from neutral observed during treatment. Goal status: Met   3.  Patient to perform 10 curl ups Baseline: 1 performed at eval; 03/13/22 10-15 reps Goal status: Met       LONG TERM GOALS: Target date: 06/22/2021   0d hip extension  Baseline: -20d from neutral; 03/27/22 -25d B; 04/16/22 -18d R, -23d L Goal status: Ongoing   2.  Decrease 5x STS time to <15s Baseline: 16s arms crossed; 03/27/22 18s 04/16/22 13s  Goal status: Met   3.  Increase core strength to 3+/5 Baseline: 3/5 as evidenced by inability to perform curl up Goal status: Ongoing   4.  Increase FOTO score to 64 Baseline: 55; 10/10 45; 03/27/22 42 Goal status: Not met         PLAN: PT FREQUENCY: 1x/week   PT DURATION: 4 weeks   PLANNED INTERVENTIONS: Therapeutic exercises, Therapeutic activity, Neuromuscular re-education,  Balance training, Gait training, Patient/Family education, Self Care, Joint mobilization, Stair training, DME instructions, Aquatic Therapy, Dry Needling, Spinal mobilization, Manual therapy, and Re-evaluation.   PLAN FOR NEXT SESSION: DC to HEP and upcoming neuro consult    Lanice Shirts, PT 04/24/2022, 2:39 PM

## 2022-05-01 ENCOUNTER — Ambulatory Visit: Payer: Medicare Other

## 2022-05-10 ENCOUNTER — Ambulatory Visit: Payer: Medicare Other | Attending: Physical Medicine and Rehabilitation

## 2022-05-10 DIAGNOSIS — R5381 Other malaise: Secondary | ICD-10-CM | POA: Insufficient documentation

## 2022-05-10 DIAGNOSIS — M5459 Other low back pain: Secondary | ICD-10-CM | POA: Insufficient documentation

## 2022-05-10 DIAGNOSIS — M868X8 Other osteomyelitis, other site: Secondary | ICD-10-CM | POA: Insufficient documentation

## 2022-05-10 NOTE — Therapy (Signed)
OUTPATIENT PHYSICAL THERAPY TREATMENT NOTE/DC SUMMARY   Patient Name: Haley Singh MRN: [id number] DOB:December 19, 1950, 71 y.o., female Today's Date: 05/10/2022  PCP: Katherina Mires, MD REFERRING PROVIDER: Gertie Gowda, DO PHYSICAL THERAPY DISCHARGE SUMMARY  Visits from Start of Care: 17  Current functional level related to goals / functional outcomes: Status of spinal condition unchanged   Remaining deficits: Pain and decreased mobility   Education / Equipment: HEP   Patient agrees to discharge. Patient goals were partially met. Patient is being discharged due to a change in medical status.  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   Hyperlipidemia    Hypertension    followed by pcp  (11-10-2019 pt stated never had a stress test)   Insomnia    MDD (major depressive disorder)    Mild persistent asthma    followed by pcp and dr Bernita Raisin (pulmonologist)   RLS (restless legs syndrome)    Vertebral osteomyelitis ([place]) 02/21/2021   Past Surgical History:  Procedure Laterality Date   BUBBLE STUDY  01/25/2021   Procedure: BUBBLE STUDY;  Surgeon: Buford Dresser, MD;  Location: [place] [facility];  Service: Cardiovascular;;   CATARACT EXTRACTION W/ INTRAOCULAR LENS  IMPLANT, BILATERAL  2009; 2010   CYSTOSCOPY/URETEROSCOPY/HOLMIUM LASER/STENT  PLACEMENT Right 08/26/2019   Procedure: CYSTOSCOPY/RETROGRADE/URETEROSCOPY/HOLMIUM LASER/STENT PLACEMENT;  Surgeon: Ceasar Mons, MD;  Location: [facility];  Service: Urology;  Laterality: Right;   CYSTOSCOPY/URETEROSCOPY/HOLMIUM LASER/STENT PLACEMENT Left 11/17/2019   Procedure: CYSTOSCOPY/URETEROSCOPY, RETROGRADE,LASER LITHOTRIPSY, STENT PLACEMENT;  Surgeon: Ceasar Mons, MD;  Location: [facility];  Service: Urology;  Laterality: Left;   CYSTOSCOPY/URETEROSCOPY/HOLMIUM LASER/STENT PLACEMENT Left 02/19/2021   Procedure: CYSTOSCOPY/URETEROSCOPY/HOLMIUM LASER/STENT PLACEMENT;  Surgeon: Lucas Mallow, MD;  Location: [facility];  Service: Urology;  Laterality: Left;   EXPLORATORY LAPAROTOMY W/ BOWEL RESECTION  01-15-2004  _0    small bowel resection for stricture/ benzoar   EXTRACORPOREAL SHOCK WAVE LITHOTRIPSY Right 03/27/2018   Procedure: EXTRACORPOREAL SHOCK WAVE LITHOTRIPSY (ESWL);  Surgeon: Ardis Hughs, MD;  Location: [facility];  Service: Urology;  Laterality: Right;   EXTRACORPOREAL SHOCK WAVE LITHOTRIPSY  x2 2008;  2009;  2015   HOLMIUM LASER APPLICATION Left 09/10/8117   Procedure: HOLMIUM LASER APPLICATION;  Surgeon: Ceasar Mons, MD;  Location: [facility];  Service: Urology;  Laterality: Left;   IR FLUORO GUIDED NEEDLE PLC ASPIRATION/INJECTION LOC  02/22/2021   ORIF ANKLE FRACTURE Right 09/ 2008 _1    per pt has retained hardware   TEE WITHOUT CARDIOVERSION N/A 01/25/2021   Procedure: TRANSESOPHAGEAL ECHOCARDIOGRAM (TEE);  Surgeon: Buford Dresser, MD;  Location: [place];  Service:  Cardiovascular;  Laterality: N/A;   TONSILLECTOMY  age 32   Patient Active Problem List   Diagnosis Date Noted   Groin pain, left 04/23/2022   Spinal stenosis of lumbar region 04/23/2022   Back pain of lumbar region with sciatica 04/02/2022   Arthralgia 04/02/2022   Fatigue due to excessive exertion  01/31/2022   Weight gain 01/31/2022   Penicillin allergy    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   Vertebral osteomyelitis ([place]) 02/21/2021   Intractable nausea and vomiting 02/18/2021   Hypokalemia 02/18/2021   MSSA bacteremia    Discitis of lumbar region    Osteomyelitis ([place]) 01/17/2021   Mild persistent asthma without complication 56/31/4970   OSA (obstructive sleep apnea) 03/03/2014    REFERRING DIAG: T73.3XXA (ICD-10-CM) - Fatigue due to excessive exertion, initial encounter M54.89 (ICD-10-CM) - Midline back pain, unspecified back location, unspecified chronicity   THERAPY DIAG:  Other low back pain  Physical deconditioning  Other osteomyelitis, other site [facility])  Rationale for Evaluation and Treatment Rehabilitation  PERTINENT HISTORY: [name] is a 71 y/o female presenting to PM&R clinic as a referral from her PCP Dr. Reed Breech for pain s/p osteomyelitis . She states when they made the referral, she was in pain but that is no longer the case. She has persistent fatigue, which is the most bothersome thing for her. She also has occassional muscle spasms, but these self resolved.    Fatigue: Has been ongoing and debilitating over the past year. She has not gone to PT, had any medications, or other treatments specifically for fatigue. Does endorse family Hx hypothyroidism, but no personal Hx. Has had some mild weight gain with inactivity. She also endorses poor tolerance of hot weather, but denies any fatigue or pain associated with it. She denies any associated weakness, sensory changes, pain, diplopia or pruritis.    Pain: Her osteomyelitis was at L2-3, and she also has a bulging disc at L2. She was taking oxycodone at the time 2-3x daily in acute phase, but has been off of it since January 2023. She has been getting massages every few weeks to stretch her Psoas, which is helping with her posture.    Function/social: Lives in a 3 story town home by [employer]. Used to work part time at [employer], currently unemployed. She used to do woodworking and photography, but does not anymore because heat tends to make her fatigue worse. She used to exercise in her complex pool, but has also stopped doing that due to heat. She is independent of mobility and ADLs.    She is on venlafaxane for anger and it is very effect.  PRECAUTIONS: Other: osteomyelitis  SUBJECTIVE: Arrived for scheduled visit and met in waiting room.  Patient has potentially torn the meniscus in her L knee and has been seen at [facility], was issued a L knee brace and prednisone dose pack.  Neither provide relief.  She will return in 1 week for f/u.  Has not heard from neurosurgery regarding consult for ongoing lumbar issue.  Will DC current PT program as she undergoes care for L knee which is now her main limiting condition.   PAIN:  Are you having pain? No   OBJECTIVE: (objective measures completed at initial evaluation unless otherwise dated)   DIAGNOSTIC FINDINGS:  L2-3 and L3-4 areas have progressed from mild/mild-to-moderate spinal stenosis to moderate/moderate-severe stenosis since last imaging 10 months prior.    Given intermittent, positional lower extremity radicular symptoms, spasms, and ongoing ambulatory difficulty/weakness, feel patient  would benefit from re-evaluation by Neurosurgery. Established with Dr. Kristeen Miss of [facility] in [place], [place]; recommend return to his practice for opinion, referral sent.   PATIENT SURVEYS:  FOTO 55(64 predicted); 03/13/22 45; 03/27/22 42%   SCREENING FOR RED FLAGS: negative   COGNITION:           Overall cognitive status: Within functional limits for tasks assessed                          SENSATION: Not tested   MUSCLE LENGTH: Hamstrings: Right 90 deg; Left 90 deg Thomas test: -20d from neutral B   POSTURE:  patient demos a forward flexed posture   PALPATION: Not tested   LUMBAR ROM:    Active  A/PROM  eval 04/03/22  Flexion WNL WFL   Extension -25% from neutral -25% from neutral  Right lateral flexion     Left lateral flexion     Right rotation     Left rotation      (Blank rows = not tested)   LOWER EXTREMITY ROM:      Active  Right eval Left eval PROM L 04/03/22 PROM R 04/03/22 PROM L 04/16/22 PROM R 04/16/22  Hip flexion -20d -20d -30d -15d -23d -18d  Hip extension          Hip abduction          Hip adduction          Hip internal rotation          Hip external rotation          Knee flexion          Knee extension          Ankle dorsiflexion          Ankle plantarflexion          Ankle inversion          Ankle eversion           (Blank rows = not tested)   LOWER EXTREMITY MMT:     MMT Right eval Left eval R 04/16/22 L 04/16/22  Hip flexion  4  4- 4+ 4  Hip extension        Hip abduction  3+  3+ 4+ 4+  Hip adduction        Hip internal rotation        Hip external rotation        Knee flexion        Knee extension        Ankle dorsiflexion        Ankle plantarflexion        Ankle inversion        Ankle eversion        Core/trunk flexion 3 3 3+ 3+   (Blank rows = not tested)   LUMBAR SPECIAL TESTS:  Straight leg raise test: Negative, Slump test: Negative, Single leg stance test: Negative, FABER test: Positive, and Thomas test: Positive   FUNCTIONAL TESTS:  5 times sit to stand: 16s with arms crossed   GAIT: Distance walked: 8f x2 Assistive device utilized: None Level of assistance: Complete Independence Comments: antalgic   [facility] Adult PT Treatment:  DATE: 04/24/22 Therapeutic Exercise: Heel raises at wall 15x B ankle pumps in TKE 15/15 Seated hamstring stretch 30s 2 B Nustep L2 8 min FAQs B 15/15 Seated heel slides over towel 15/15    [facility] Adult PT Treatment:                                                DATE: 04/16/22 Therapeutic Exercise: Curl ups 15x Sidelie abd 15x B Manual hip flexor stretch in prone 30s x2  B Nustep L6 6 min PKBs B 30s x3 w/strap Seated hamstring stretch 30s 2 B  [facility] Adult PT Treatment:                                                DATE: 04/10/22 Therapeutic Exercise: Curl ups 15x SLR 2# 15/15 Bridge w/ball 15x Supine alternating march 15/15 2# Manual hip flexor stretch in prone 30s x2 B Nustep L5 8 min PPT 3s hold 15x Prone quad stretch 30s x2 B 15 PKBs B Seated hamstring stretch 30s 2 B STS w/OH reach 10x  [facility] Adult PT Treatment:                                                DATE: 04/05/2022 Aquatic therapy at [address] - therapeutic pool temp 92 degrees Pt enters building ambulating with SPC. Treatment took place in water 3.8 to  4 ft 8 in.feet deep depending upon activity.  Pt entered and exited the pool via stair and handrails independently   Therapeutic Exercise: Walking forward/backwards/side stepping x 3 laps each Marching walk x3 laps with rainbow dumbbells Side stepping with shoulder ab/adduction colorful dumbbells x3 laps Runners stretch on bottom step x30" BIL Hamstring stretch on bottom step x30" BIL Hip flexor stretch - 45'' Figure 4 squat stretch, BIL UE support x30" BIL Riding yellow pool noodle and breast stroke across pool - 2 laps Sit to stand - 3x10 - step 4  At with yellow DB pt performed LE exercise: Hip abd/add 1x20 BIL Noodle stomp - yellow - 2x20 ea (NT) Hip ext/flex with knee straight 1x 20 BIL Squat 2x20 Walking lunge - 2 laps  Pt requires the buoyancy of water for active assisted exercises with buoyancy supported for strengthening and AROM exercises. Hydrostatic pressure also supports joints by unweighting joint load by at least 50 % in 3-4 feet depth water. 80% in chest to neck deep water. Water will provide assistance with movement using the current and laminar flow while the buoyancy reduces weight bearing. Pt requires the viscosity of the water for resistance with strengthening exercises.    HOME EXERCISE  PROGRAM: Access Code: [email or web address] URL: [email or web address] Date: 02/27/2022 Prepared by: Sharlynn Oliphant  Exercises - Hip Flexor Stretch at [facility] of Bed  - 2 x daily - 5 x weekly - 1 sets - 3 reps - 30s hold - Supine Posterior Pelvic Tilt  - 2 x daily - 5 x weekly - 1 sets - 15 reps - Supine Bridge  - 2 x daily - 5 x weekly - 1 sets - 15  reps - Sidelying Open Book Thoracic Lumbar Rotation and Extension  - 2 x daily - 5 x weekly - 1 sets - 10 reps   ASSESSMENT:   CLINICAL IMPRESSION: Arrived for scheduled visit and met in waiting room.  Patient has potentially torn the meniscus in her L knee and has been seen at [facility], was issued a L knee brace and prednisone dose pack.  Neither provide relief.  She will return in 1 week for f/u.  Has not heard from neurosurgery regarding consult for ongoing lumbar issue.  Will DC current PT program as she undergoes care for L knee which is now her main limiting condition.   OBJECTIVE IMPAIRMENTS Abnormal gait, decreased activity tolerance, decreased balance, decreased endurance, decreased knowledge of condition, decreased mobility, difficulty walking, decreased ROM, increased muscle spasms, improper body mechanics, postural dysfunction, and pain.    ACTIVITY LIMITATIONS carrying, lifting, standing, and bed mobility   PERSONAL FACTORS Age, Past/current experiences, Time since onset of injury/illness/exacerbation, and 1 comorbidity: osteomyelitis  are also affecting patient's functional outcome.    REHAB POTENTIAL: Good   CLINICAL DECISION MAKING: Evolving/moderate complexity   EVALUATION COMPLEXITY: Moderate     GOALS: Goals reviewed with patient? Yes   SHORT TERM GOALS: Target date: 03/13/2022   Patient to demonstrate independence in HEP  Baseline:[id number] Goal status: Met   2.  Increase B hip extension to -10d from neutral Baseline: -20d from neutral; 03/13/22 -10d from neutral observed during treatment. Goal status: Met    3.  Patient to perform 10 curl ups Baseline: 1 performed at eval; 03/13/22 10-15 reps Goal status: Met       LONG TERM GOALS: Target date: 06/22/2021   0d hip extension  Baseline: -20d from neutral; 03/27/22 -25d B; 04/16/22 -18d R, -23d L Goal status: Ongoing   2.  Decrease 5x STS time to <15s Baseline: 16s arms crossed; 03/27/22 18s 04/16/22 13s  Goal status: Met   3.  Increase core strength to 3+/5 Baseline: 3/5 as evidenced by inability to perform curl up Goal status: Ongoing   4.  Increase FOTO score to 64 Baseline: 55; 10/10 45; 03/27/22 42 Goal status: Not met         PLAN: PT FREQUENCY: 1x/week   PT DURATION: 4 weeks   PLANNED INTERVENTIONS: Therapeutic exercises, Therapeutic activity, Neuromuscular re-education, Balance training, Gait training, Patient/Family education, Self Care, Joint mobilization, Stair training, DME instructions, Aquatic Therapy, Dry Needling, Spinal mobilization, Manual therapy, and Re-evaluation.   PLAN FOR NEXT SESSION: DC current episode of care    Lanice Shirts, PT 05/10/2022, 12:26 PM

## 2022-07-04 ENCOUNTER — Encounter: Payer: Medicare Other | Admitting: Physical Medicine and Rehabilitation

## 2022-07-10 NOTE — Progress Notes (Signed)
Subjective:    Patient ID: Haley Singh, female    DOB: Aug 15, 1950, 72 y.o.   MRN: [id number]  HPI Haley Singh is a 72 y/o female presenting to PM&R clinic as a follow up for pain s/p osteomyelitis, now intermittent and positional with occasional muscle spasms in legs, along with ongoing fatigue which is improving.  She presents today for follow up of neuropathic LE pain and weakness, along with acute L groin pain.   Plan from last visit:   Spinal stenosis of lumbar region, unspecified whether neurogenic claudication present Assessment & Plan: L2-3 and L3-4 areas have progressed from mild/mild-to-moderate spinal stenosis to moderate/moderate-severe stenosis since last imaging 10 months prior.    Given intermittent, positional lower extremity radicular symptoms, spasms, and ongoing ambulatory difficulty/weakness, feel patient would benefit from re-evaluation by Neurosurgery. Established with Dr. Kristeen Miss of [facility] in [place], [place]; recommend return to his practice for opinion, referral sent.  Groin pain, left Assessment & Plan: Advised heat and stretching for now.   Minimal benefit from PRN robaxin. Gave temporary script for Tizanidine 4 mg Q8H PRN #30 tabs R0   Advised if no improvement in 2-3 weeks, patient can call office and we can obtain L hip xrays. Low index of suspicion for fracture given nontraumatic onset (rolled in bed)     Back pain of lumbar region with sciatica Assessment & Plan: Advised patient that she can take gabapentin 300 mg once daily PRN in addition to nighttime dose if "zinger" neuropathic pains re-occur.   Given no neurosurgical restrictions on activity after prior imaging and only significant change worsening spinal stenosis, advised patient that lower extremity exercises in a gym setting should be safe at this time. Did review increased pain, weakness, or ref flag symptoms as indication to stop.   Interval Hx: - She went to [facility]  from her groin pain; she had a flouroscopy guided hip injection with them on 1 week which helped significantly. She was given hydrocodone initially, which did nothing, and then switched to oxycodone, which was better. She ends up taking it twice per day, can take it up to three times daily. She has an order for PT from them, which she is getting with the same place.   - Groin pain lasted for approximately 7 weeks until treated; in this time she feels her ambulation and activity tolerance has essentially backslide back to where it was before starting therapies, since she was bedridden some days.   - Has had ongoing pain in her right knee; she had fluid taken off of the right knee in January 16th. Has follow up with Ortho in a few weeks; unsure if they are doing an injection for that.She got a sleeve to wear over her knee; she states it was too big and didn't fit right.   - She followed up with Dr. Isac Caddy neurosurgery given results of last MRI, no new recommendations   Pain Inventory Average Pain 3 Pain Right Now 3 My pain is intermittent and dull  In the last 24 hours, has pain interfered with the following? General activity 5 Relation with others 0 Enjoyment of life 3 What TIME of day is your pain at its worst? morning  and daytime Sleep (in general) Good  Pain is worse with: some activites Pain improves with: rest and medication Relief from Meds: 7  Family History  Problem Relation Age of Onset   Breast cancer Maternal Aunt    Heart disease Father  Heart Problems Father    Social History   Socioeconomic History   Marital status: Single    Spouse name: Not on file   Number of children: Not on file   Years of education: Not on file   Highest education level: Not on file  Occupational History   Occupation: Retired    Fish farm manager: RETIRED  Tobacco Use   Smoking status: Never   Smokeless tobacco: Never  Vaping Use   Vaping Use: Never used  Substance and Sexual Activity    Alcohol use: Yes    Alcohol/week: 7.0 standard drinks of alcohol    Types: 7 Standard drinks or equivalent per week    Comment: scotch or wine maybe 1 a night   Drug use: Never   Sexual activity: Not on file  Other Topics Concern   Not on file  Social History Narrative   Single, lives alone   No children   OCCUPATION: retired, IT support   Social Determinants of Radio broadcast assistant Strain: Not on file  Food Insecurity: Not on file  Transportation Needs: Not on file  Physical Activity: Not on file  Stress: Not on file  Social Connections: Not on file   Past Surgical History:  Procedure Laterality Date   BUBBLE STUDY  01/25/2021   Procedure: BUBBLE STUDY;  Surgeon: Buford Dresser, MD;  Location: [place];  Service: Cardiovascular;;   CATARACT EXTRACTION W/ INTRAOCULAR LENS  IMPLANT, BILATERAL  2009; 2010   CYSTOSCOPY/URETEROSCOPY/HOLMIUM LASER/STENT PLACEMENT Right 08/26/2019   Procedure: CYSTOSCOPY/RETROGRADE/URETEROSCOPY/HOLMIUM LASER/STENT PLACEMENT;  Surgeon: Ceasar Mons, MD;  Location: [facility];  Service: Urology;  Laterality: Right;   CYSTOSCOPY/URETEROSCOPY/HOLMIUM LASER/STENT PLACEMENT Left 11/17/2019   Procedure: CYSTOSCOPY/URETEROSCOPY, RETROGRADE,LASER LITHOTRIPSY, STENT PLACEMENT;  Surgeon: Ceasar Mons, MD;  Location: [facility];  Service: Urology;  Laterality: Left;   CYSTOSCOPY/URETEROSCOPY/HOLMIUM LASER/STENT PLACEMENT Left 02/19/2021   Procedure: CYSTOSCOPY/URETEROSCOPY/HOLMIUM LASER/STENT PLACEMENT;  Surgeon: Lucas Mallow, MD;  Location: [facility];  Service: Urology;  Laterality: Left;   EXPLORATORY LAPAROTOMY W/ BOWEL RESECTION  01-15-2004  @[facility]$    small bowel resection for stricture/ benzoar   EXTRACORPOREAL SHOCK WAVE LITHOTRIPSY Right 03/27/2018   Procedure: EXTRACORPOREAL SHOCK WAVE LITHOTRIPSY (ESWL);  Surgeon: Ardis Hughs, MD;  Location: [facility];  Service: Urology;   Laterality: Right;   EXTRACORPOREAL SHOCK WAVE LITHOTRIPSY  x2 2008;  2009;  2015   HOLMIUM LASER APPLICATION Left [id number]   Procedure: HOLMIUM LASER APPLICATION;  Surgeon: Ceasar Mons, MD;  Location: [facility];  Service: Urology;  Laterality: Left;   IR FLUORO GUIDED NEEDLE PLC ASPIRATION/INJECTION LOC  02/22/2021   ORIF ANKLE FRACTURE Right 09/ 2008 @[facility]$    per pt has retained hardware   TEE WITHOUT CARDIOVERSION N/A 01/25/2021   Procedure: TRANSESOPHAGEAL ECHOCARDIOGRAM (TEE);  Surgeon: Buford Dresser, MD;  Location: [facility];  Service: Cardiovascular;  Laterality: N/A;   TONSILLECTOMY  age 7   Past Surgical History:  Procedure Laterality Date   BUBBLE STUDY  01/25/2021   Procedure: BUBBLE STUDY;  Surgeon: Buford Dresser, MD;  Location: [facility];  Service: Cardiovascular;;   CATARACT EXTRACTION W/ INTRAOCULAR LENS  IMPLANT, BILATERAL  2009; 2010   CYSTOSCOPY/URETEROSCOPY/HOLMIUM LASER/STENT PLACEMENT Right 08/26/2019   Procedure: CYSTOSCOPY/RETROGRADE/URETEROSCOPY/HOLMIUM LASER/STENT PLACEMENT;  Surgeon: Ceasar Mons, MD;  Location: [facility];  Service: Urology;  Laterality: Right;   CYSTOSCOPY/URETEROSCOPY/HOLMIUM LASER/STENT PLACEMENT Left 11/17/2019   Procedure: CYSTOSCOPY/URETEROSCOPY, RETROGRADE,LASER LITHOTRIPSY, STENT PLACEMENT;  Surgeon: Ceasar Mons,  MD;  Location: [place];  Service: Urology;  Laterality: Left;   CYSTOSCOPY/URETEROSCOPY/HOLMIUM LASER/STENT PLACEMENT Left 02/19/2021   Procedure: CYSTOSCOPY/URETEROSCOPY/HOLMIUM LASER/STENT PLACEMENT;  Surgeon: Lucas Mallow, MD;  Location: [facility];  Service: Urology;  Laterality: Left;   EXPLORATORY LAPAROTOMY W/ BOWEL RESECTION  01-15-2004  @[facility]    small bowel resection for stricture/ benzoar   EXTRACORPOREAL SHOCK WAVE LITHOTRIPSY Right 03/27/2018   Procedure: EXTRACORPOREAL SHOCK WAVE LITHOTRIPSY (ESWL);  Surgeon:  Ardis Hughs, MD;  Location: [facility];  Service: Urology;  Laterality: Right;   EXTRACORPOREAL SHOCK WAVE LITHOTRIPSY  x2 2008;  2009;  2015   HOLMIUM LASER APPLICATION Left [id number]   Procedure: HOLMIUM LASER APPLICATION;  Surgeon: Ceasar Mons, MD;  Location: [facility];  Service: Urology;  Laterality: Left;   IR FLUORO GUIDED NEEDLE PLC ASPIRATION/INJECTION LOC  02/22/2021   ORIF ANKLE FRACTURE Right 09/ 2008 @[facility]    per pt has retained hardware   TEE WITHOUT CARDIOVERSION N/A 01/25/2021   Procedure: TRANSESOPHAGEAL ECHOCARDIOGRAM (TEE);  Surgeon: Buford Dresser, MD;  Location: [facility];  Service: Cardiovascular;  Laterality: N/A;   TONSILLECTOMY  age 36   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  Opioid Risk Score:   Fall Risk Score:  `1  Depression screen [facility] 2/9     04/23/2022   10:12 AM 04/02/2022    1:02 PM 01/31/2022    2:19 PM 10/04/2021    9:08 AM 08/02/2021    2:28 PM 03/10/2021   11:39 AM  Depression screen PHQ 2/9  Decreased Interest 0 0 0 0 0 0  Down, Depressed, Hopeless 0 0 0 0 0 0  PHQ - 2 Score 0 0 0 0 0 0  Altered sleeping   0     Tired, decreased energy   3     Change in appetite   0     Feeling bad or failure about yourself    0     Trouble concentrating   0     Moving slowly or fidgety/restless   0     Suicidal thoughts    0     PHQ-9 Score   3     Difficult doing work/chores   Somewhat difficult       Review of Systems  Musculoskeletal:  Positive for gait problem.       RIGHT KNEE PAIN  All other systems reviewed and are negative.      Objective:   Physical Exam  Constitution: Appropriate appearance for age. No apparnet distress  HEENT: PERRL, EOMI grossly intact.  Resp: CTAB. No rales, rhonchi, or wheezing. Cardio: RRR. No mumurs, rubs, or gallops. No peripheral edema. Abdomen: Nondistended. Nontender. +bowel sounds. Psych: Appropriate mood and affect. Neuro: AAOx4.  No apparent deficits. Sensation to light touch intact in bilateral lower extremities.  MSK: Strength testing limited by pain in L hip/groin, 5/5 in knee extension, DF, and PF 5/5 throughout RLE.   Area of greatest pain in L anterior groin crease. Not worsened with palpation. Severe pain with passive hip abduction and external rotation; mild pain with internal rotation.   Antalgic gait favoring R foot. No ataxia. Forward lean, unchanged from prior. Walks with cane for stability      Assessment & Plan:   Haley Singh is a 72 y.o. year old female  who  has a past medical history of Arthritis, Chronic low back pain, Depression, Epidural abscess (02/21/2021), GAD (generalized anxiety disorder), History of kidney stones, History of small bowel obstruction (01/2004), Hyperlipidemia, Hypertension, Insomnia, MDD (major depressive disorder), Mild persistent asthma, RLS (restless legs syndrome), and Vertebral osteomyelitis ([place]) (02/21/2021).  She presents today for follow up of neuropathic LE pain and weakness, along with acute L groin pain.    Spinal stenosis of lumbar region, unspecified whether neurogenic claudication present Assessment & Plan: No intervention per Dr. Ellene Route   Groin pain, left Assessment & Plan: Completely resolved after fluoroscopic hip injection with [facility] last week  Getting oxycodone through their office,  feels pain well controlled    Fatigue due to excessive exertion, initial encounter Assessment & Plan: Unfortunately, lost the gains she had made in PT between onset and treatment of groin pain from hip OA over 6-7 weeks  Going back to PT per Ortho. Still plans for gym membership once more mobile; encouraged her with this   Chronic pain of right knee Assessment & Plan: Had fluid taken off by orthopedics 06/19/22; no infection or other concerning findings  Has f/u with them scheduled, may get joint injection at that time. Advised if timing is an issue, our office could also perform injections, but would wait until 6 weeks after last steroid injection.  You can try voltaren gel, lidocaine gel, or a compressive sleeve for pain in your knee  Follow up with me as needed for injections, pain, or any other concerns

## 2022-07-11 ENCOUNTER — Encounter: Payer: Medicare Other | Attending: Physical Medicine & Rehabilitation | Admitting: Physical Medicine and Rehabilitation

## 2022-07-11 ENCOUNTER — Encounter: Payer: Self-pay | Admitting: Physical Medicine and Rehabilitation

## 2022-07-11 VITALS — BP 143/70 | HR 86 | Ht 66.0 in | Wt 217.0 lb

## 2022-07-11 DIAGNOSIS — M48061 Spinal stenosis, lumbar region without neurogenic claudication: Secondary | ICD-10-CM | POA: Insufficient documentation

## 2022-07-11 DIAGNOSIS — R1032 Left lower quadrant pain: Secondary | ICD-10-CM | POA: Insufficient documentation

## 2022-07-11 DIAGNOSIS — T733XXA Exhaustion due to excessive exertion, initial encounter: Secondary | ICD-10-CM | POA: Insufficient documentation

## 2022-07-11 DIAGNOSIS — G8929 Other chronic pain: Secondary | ICD-10-CM | POA: Diagnosis present

## 2022-07-11 DIAGNOSIS — M25561 Pain in right knee: Secondary | ICD-10-CM | POA: Insufficient documentation

## 2022-07-11 NOTE — Patient Instructions (Signed)
You can try voltaren gel, lidocaine gel, or a compressive sleeve for pain in your knee  Follow up with me as needed for injections, pain, or any other concerns

## 2022-07-16 DIAGNOSIS — G8929 Other chronic pain: Secondary | ICD-10-CM | POA: Insufficient documentation

## 2022-07-16 NOTE — Assessment & Plan Note (Signed)
Had fluid taken off by orthopedics 06/19/22; no infection or other concerning findings  Has f/u with them scheduled, may get joint injection at that time. Advised if timing is an issue, our office could also perform injections, but would wait until 6 weeks after last steroid injection.  You can try voltaren gel, lidocaine gel, or a compressive sleeve for pain in your knee  Follow up with me as needed for injections, pain, or any other concerns

## 2022-07-16 NOTE — Assessment & Plan Note (Signed)
No intervention per Dr. Ellene Route

## 2022-07-16 NOTE — Assessment & Plan Note (Addendum)
Unfortunately, lost the gains she had made in PT between onset and treatment of groin pain from hip OA over 6-7 weeks  Going back to PT per Ortho. Still plans for gym membership once more mobile; encouraged her with this

## 2022-07-16 NOTE — Assessment & Plan Note (Signed)
Completely resolved after fluoroscopic hip injection with [facility] last week  Getting oxycodone through their office, feels pain well controlled

## 2022-07-23 ENCOUNTER — Ambulatory Visit: Payer: Medicare Other

## 2022-08-02 NOTE — Therapy (Signed)
OUTPATIENT PHYSICAL THERAPY LOWER EXTREMITY EVALUATION   Patient Name: Haley Singh MRN: [id number] DOB:23-Apr-1951, 72 y.o., female Today's Date: 08/03/2022  END OF SESSION:  PT End of Session - 08/03/22 0816     Visit Number 1    Number of Visits 8    Date for PT Re-Evaluation 09/28/22    Authorization Type UHC    PT Start Time 0830    PT Stop Time 0915    PT Time Calculation (min) 45 min    Activity Tolerance Patient tolerated treatment well    Behavior During Therapy [facility] for tasks assessed/performed             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   Hyperlipidemia    Hypertension    followed by pcp  (11-10-2019 pt stated never had a stress test)   Insomnia    MDD (major depressive disorder)    Mild persistent asthma    followed by pcp and dr Bernita Raisin (pulmonologist)   RLS (restless legs syndrome)    Vertebral osteomyelitis ([place]) 02/21/2021   Past Surgical History:  Procedure Laterality Date   BUBBLE STUDY  01/25/2021   Procedure: BUBBLE STUDY;  Surgeon: Buford Dresser, MD;  Location: [place];  Service: Cardiovascular;;   CATARACT EXTRACTION W/ INTRAOCULAR LENS  IMPLANT, BILATERAL  2009; 2010   CYSTOSCOPY/URETEROSCOPY/HOLMIUM LASER/STENT PLACEMENT Right 08/26/2019   Procedure: CYSTOSCOPY/RETROGRADE/URETEROSCOPY/HOLMIUM LASER/STENT PLACEMENT;  Surgeon: Ceasar Mons, MD;  Location: [facility];  Service: Urology;  Laterality: Right;   CYSTOSCOPY/URETEROSCOPY/HOLMIUM LASER/STENT PLACEMENT Left 11/17/2019   Procedure: CYSTOSCOPY/URETEROSCOPY, RETROGRADE,LASER LITHOTRIPSY, STENT PLACEMENT;  Surgeon: Ceasar Mons, MD;  Location: [facility];   Service: Urology;  Laterality: Left;   CYSTOSCOPY/URETEROSCOPY/HOLMIUM LASER/STENT PLACEMENT Left 02/19/2021   Procedure: CYSTOSCOPY/URETEROSCOPY/HOLMIUM LASER/STENT PLACEMENT;  Surgeon: Lucas Mallow, MD;  Location: [facility];  Service: Urology;  Laterality: Left;   EXPLORATORY LAPAROTOMY W/ BOWEL RESECTION  01-15-2004  '@[facility]'$    small bowel resection for stricture/ benzoar   EXTRACORPOREAL SHOCK WAVE LITHOTRIPSY Right 03/27/2018   Procedure: EXTRACORPOREAL SHOCK WAVE LITHOTRIPSY (ESWL);  Surgeon: Ardis Hughs, MD;  Location: [facility];  Service: Urology;  Laterality: Right;   EXTRACORPOREAL SHOCK WAVE LITHOTRIPSY  x2 2008;  2009;  2015   HOLMIUM LASER APPLICATION Left [id number]   Procedure: HOLMIUM LASER APPLICATION;  Surgeon: Ceasar Mons, MD;  Location: [facility];  Service: Urology;  Laterality: Left;   IR FLUORO GUIDED NEEDLE PLC ASPIRATION/INJECTION LOC  02/22/2021   ORIF ANKLE FRACTURE Right 09/ 2008 '@[facility]'$    per pt has retained hardware   TEE WITHOUT CARDIOVERSION N/A 01/25/2021   Procedure: TRANSESOPHAGEAL ECHOCARDIOGRAM (TEE);  Surgeon: Buford Dresser, MD;  Location: [facility];  Service: Cardiovascular;  Laterality: N/A;   TONSILLECTOMY  age 73   Patient Active Problem List   Diagnosis Date Noted   Chronic pain of right knee 07/16/2022   Groin pain, left 04/23/2022   Spinal stenosis of lumbar region 04/23/2022   Back pain of lumbar region with sciatica 04/02/2022   Arthralgia 04/02/2022   Fatigue due to excessive exertion 01/31/2022   Weight gain 01/31/2022   Penicillin allergy  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   Vertebral osteomyelitis ([place]) 02/21/2021   Intractable nausea and vomiting 02/18/2021   Hypokalemia 02/18/2021   MSSA bacteremia    Discitis of lumbar region    Osteomyelitis ([place]) 01/17/2021   Mild persistent asthma without complication [id number]   OSA (obstructive sleep apnea) 03/03/2014    PCP: Katherina Mires, MD    REFERRING PROVIDER: Netta Cedars, MD  REFERRING DIAG: [phone] (ICD-10-CM) - Unilateral primary osteoarthritis, left hip  THERAPY DIAG:   Rationale for Evaluation and Treatment: Rehabilitation  ONSET DATE: 01/31/2022   SUBJECTIVE:   SUBJECTIVE STATEMENT: Returns to [facility] for L hip pain and weakness, has had L hip injected with marked relief of symptoms, L knee pain and swelling also limits activity, has questionable meniscus tear but has not ben offered surgical correction  PERTINENT HISTORY: Haley Singh is a 72 y/o female presenting to PM&R clinic as a referral from her PCP Dr. Reed Breech for pain s/p osteomyelitis . She states when they made the referral, she was in pain but that is no longer the case. She has persistent fatigue, which is the most bothersome thing for her. She also has occassional muscle spasms, but these self resolved.    Fatigue: Has been ongoing and debilitating over the past year. She has not gone to PT, had any medications, or other treatments specifically for fatigue. Does endorse family Hx hypothyroidism, but no personal Hx. Has had some mild weight gain with inactivity. She also endorses poor tolerance of hot weather, but denies any fatigue or pain associated with it. She denies any associated weakness, sensory changes, pain, diplopia or pruritis.    Pain: Her osteomyelitis was at L2-3, and she also has a bulging disc at L2. She was taking oxycodone at the time 2-3x daily in acute phase, but has been off of it since January 2023. She has been getting massages every few weeks to stretch her Psoas, which is helping with her posture.    Function/social: Lives in a 3 story town home by [employer]. Used to work part time at [employer], currently unemployed. She used to do woodworking and photography, but does not anymore because heat tends to make her fatigue worse. She used to exercise in her complex pool, but has also stopped doing that due to heat. She is independent of  mobility and ADLs.  PAIN:  Are you having pain? rest, injections, prolonged activities  PRECAUTIONS: None  WEIGHT BEARING RESTRICTIONS: No  FALLS:  Has patient fallen in last 6 months? No  LIVING ENVIRONMENT: Lives with: lives alone Lives in: House/apartment Stairs:  tes Has following equipment at home: Single point cane  OCCUPATION: retired  PLOF: Independent  PATIENT GOALS: To avoid hip/knee surgery  NEXT MD VISIT: as needed for repeat injections  OBJECTIVE:   DIAGNOSTIC FINDINGS: IMPRESSION: 1. Sequelae of prior discitis/osteomyelitis at L2-3 with near complete loss of vertebral body height in the anterior 1/3 of the L2 vertebral body and approximately 30% vertebral body height loss posteriorly. There is approximately 50% vertebral body height loss in the posterior 2/3 of the L3 vertebral body. No findings suspicious for new focus of discitis/osteomyelitis. 2. Advanced degenerative changes of the lumbar spine with progression of the degree of spinal canal stenosis, now moderate to severe at L2-3 and severe at L3-4. 3. Severe bilateral neural foraminal narrowing at L2-3. 4. Moderate bilateral neural foraminal narrowing at L1-2 and L3-4. 5. Moderate narrowing of the bilateral subarticular zones and mild-to-moderate right and mild left neural  foraminal narrowing at L4-5.  PATIENT SURVEYS:  LEFS 39/80  MUSCLE LENGTH: Thomas test: See ROM  POSTURE:  Marked flexion in B hips  PALPATION: deferred  LOWER EXTREMITY ROM:  Active ROM Right eval Left eval  Hip flexion  120d  Hip extension  -20d(S/L)  Hip abduction  30d  Hip adduction    Hip internal rotation    Hip external rotation    Knee flexion  130d  Knee extension  -2d  Ankle dorsiflexion  10d  Ankle plantarflexion    Ankle inversion    Ankle eversion     (Blank rows = not tested)  LOWER EXTREMITY MMT:  MMT Right eval Left eval  Hip flexion  4  Hip extension  4  Hip abduction  4  Hip  adduction    Hip internal rotation    Hip external rotation    Knee flexion  4  Knee extension  4  Ankle dorsiflexion    Ankle plantarflexion  4  Ankle inversion    Ankle eversion     (Blank rows = not tested)  LOWER EXTREMITY SPECIAL TESTS:  Hip special tests: Saralyn Pilar (FABER) test: negative, Thomas test: positive , Ober's test: negative, Ely's test: negative, Hip scouring test: negative, and Anterior hip impingement test: negative  FUNCTIONAL TESTS:  5 times sit to stand: 11s arms crossed from high table  GAIT: Distance walked: 75 ftx2  Assistive device utilized: Single point cane Level of assistance: Modified independence Comments: antalgic   TODAY'S TREATMENT:                                                                                                                              DATE: 08/03/22    PATIENT EDUCATION:  Education details: Discussed eval findings, rehab rationale and POC and patient is in agreement  Person educated: Patient Education method: Explanation and Handouts Education comprehension: verbalized understanding  HOME EXERCISE PROGRAM: Access Code: [facility] URL: [email or web address] Date: 08/03/2022 Prepared by: Sharlynn Oliphant  Exercises - Clamshell  - 2 x daily - 5 x weekly - 2 sets - 15 reps - Hip Flexor Stretch at Edge of Bed  - 2 x daily - 5 x weekly - 2 sets - 15 reps - 30s hold - Small Range Straight Leg Raise  - 2 x daily - 5 x weekly - 2 sets - 15 reps  ASSESSMENT:  CLINICAL IMPRESSION: Patient is a 72 y.o. female who was seen today for physical therapy evaluation and treatment for L hip pain, ROM defcicits, and abnormal gait. Patient demos a tight R hip capsule but good strength, marked restrictions in B hip flexor musculature.  Unable to accurately assess ITB mobility due to tight hip flexors and concurrent L knee pain.  Hip scouring and impingement tests unremarkable.  OBJECTIVE IMPAIRMENTS: Abnormal gait, decreased  activity tolerance, decreased endurance, decreased mobility, difficulty walking, decreased ROM, decreased strength, impaired flexibility, improper body mechanics, postural dysfunction, and pain.  ACTIVITY LIMITATIONS: carrying, lifting, standing, sleeping, and bed mobility  PERSONAL FACTORS: Age, Fitness, Past/current experiences, Time since onset of injury/illness/exacerbation, and 1 comorbidity: osteomyelitis  are also affecting patient's functional outcome.   REHAB POTENTIAL: Fair based on previous therapy episodes to address issues  CLINICAL DECISION MAKING: Evolving/moderate complexity  EVALUATION COMPLEXITY: Low   GOALS: Goals reviewed with patient? Yes  SHORT TERM GOALS: Target date: 08/17/2022   Patient to demonstrate independence in HEP  Baseline: [id number] Goal status: INITIAL   LONG TERM GOALS: Target date: 08/31/2022    LEFS score 49/80 Baseline: 39/80 Goal status: INITIAL  2.  Increase L hip extension to -10d Baseline: -20d Goal status: INITIAL  3.  Increase L hip strength to 4+/5 Baseline:  MMT Right eval Left eval  Hip flexion  4  Hip extension  4  Hip abduction  4   Goal status: INITIAL  4.  268f ambulation w/o need of cane Baseline: 737fwith minimal use of cane Goal status: INITIAL     PLAN:  PT FREQUENCY: 2x/week  PT DURATION: 4 weeks  PLANNED INTERVENTIONS: Therapeutic exercises, Therapeutic activity, Neuromuscular re-education, Balance training, Gait training, Patient/Family education, Self Care, Joint mobilization, Stair training, Manual therapy, and Re-evaluation  PLAN FOR NEXT SESSION: HEP review and update,   L hip strengthening and stretching, gait training, functional training, aerobic work   JeLanice ShirtsPT 08/03/2022, 10:47 AM

## 2022-08-03 ENCOUNTER — Other Ambulatory Visit: Payer: Self-pay

## 2022-08-03 ENCOUNTER — Ambulatory Visit: Payer: Medicare Other | Attending: Physical Medicine and Rehabilitation

## 2022-08-03 DIAGNOSIS — M6281 Muscle weakness (generalized): Secondary | ICD-10-CM

## 2022-08-03 DIAGNOSIS — M25552 Pain in left hip: Secondary | ICD-10-CM | POA: Diagnosis present

## 2022-08-03 DIAGNOSIS — R2689 Other abnormalities of gait and mobility: Secondary | ICD-10-CM

## 2022-08-09 ENCOUNTER — Ambulatory Visit: Payer: Medicare Other

## 2022-08-09 DIAGNOSIS — M6281 Muscle weakness (generalized): Secondary | ICD-10-CM

## 2022-08-09 DIAGNOSIS — M25552 Pain in left hip: Secondary | ICD-10-CM | POA: Diagnosis not present

## 2022-08-09 DIAGNOSIS — R2689 Other abnormalities of gait and mobility: Secondary | ICD-10-CM

## 2022-08-09 NOTE — Therapy (Signed)
OUTPATIENT PHYSICAL THERAPY TREATMENT NOTE   Patient Name: Haley Singh MRN: [id number] DOB:[birth year], 72 y.o., female Today's Date: 08/09/2022  PCP: Katherina Mires, MD   REFERRING PROVIDER:Norris, Richardson Landry, MD    END OF SESSION:   PT End of Session - 08/09/22 1406     Visit Number 2    Number of Visits 8    Date for PT Re-Evaluation 09/28/22    Authorization Type UHC    PT Start Time 1400    PT Stop Time 1440    PT Time Calculation (min) 40 min    Activity Tolerance Patient tolerated treatment well    Behavior During Therapy WFL for tasks assessed/performed             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   Hyperlipidemia    Hypertension    followed by pcp  (11-10-2019 pt stated never had a stress test)   Insomnia    MDD (major depressive disorder)    Mild persistent asthma    followed by pcp and dr Bernita Raisin (pulmonologist)   RLS (restless legs syndrome)    Vertebral osteomyelitis ([place]) 02/21/2021   Past Surgical History:  Procedure Laterality Date   BUBBLE STUDY  01/25/2021   Procedure: BUBBLE STUDY;  Surgeon: Buford Dresser, MD;  Location: [place];  Service: Cardiovascular;;   CATARACT EXTRACTION W/ INTRAOCULAR LENS  IMPLANT, BILATERAL  2009; 2010   CYSTOSCOPY/URETEROSCOPY/HOLMIUM LASER/STENT PLACEMENT Right 08/26/2019   Procedure: CYSTOSCOPY/RETROGRADE/URETEROSCOPY/HOLMIUM LASER/STENT PLACEMENT;  Surgeon: Ceasar Mons, MD;  Location: [facility];  Service: Urology;  Laterality: Right;   CYSTOSCOPY/URETEROSCOPY/HOLMIUM LASER/STENT PLACEMENT Left 11/17/2019   Procedure: CYSTOSCOPY/URETEROSCOPY, RETROGRADE,LASER LITHOTRIPSY, STENT PLACEMENT;  Surgeon: Ceasar Mons, MD;  Location: [facility];  Service: Urology;  Laterality: Left;   CYSTOSCOPY/URETEROSCOPY/HOLMIUM LASER/STENT PLACEMENT Left 02/19/2021   Procedure: CYSTOSCOPY/URETEROSCOPY/HOLMIUM LASER/STENT PLACEMENT;  Surgeon: Lucas Mallow, MD;  [facility];  Service: Urology;  Laterality: Left;   EXPLORATORY LAPAROTOMY W/ BOWEL RESECTION  01-15-2004  '@[facility]'$    small bowel resection for stricture/ benzoar   EXTRACORPOREAL SHOCK WAVE LITHOTRIPSY Right 03/27/2018   Procedure: EXTRACORPOREAL SHOCK WAVE LITHOTRIPSY (ESWL);  Surgeon: Ardis Hughs, MD;  Location[facility];  Service: Urology;  Laterality: Right;   EXTRACORPOREAL SHOCK WAVE LITHOTRIPSY  x2 2008;  2009;  2015   HOLMIUM LASER APPLICATION Left [id number]   Procedure: HOLMIUM LASER APPLICATION;  Surgeon: Ceasar Mons, MD;  Location: [facility];  Service: Urology;  Laterality: Left;   IR FLUORO GUIDED NEEDLE PLC ASPIRATION/INJECTION LOC  02/22/2021   ORIF ANKLE FRACTURE Right 09/ 2008 '@[facility]'$    per pt has retained hardware   TEE WITHOUT CARDIOVERSION N/A 01/25/2021   Procedure: TRANSESOPHAGEAL ECHOCARDIOGRAM (TEE);  Surgeon: Buford Dresser, MD;  Location: [facility] ENDOSCOPY;  Service: Cardiovascular;  Laterality: N/A;   TONSILLECTOMY  age 42   Patient Active Problem List   Diagnosis Date Noted   Chronic pain of right knee 07/16/2022   Groin pain, left 04/23/2022   Spinal stenosis of lumbar region 04/23/2022   Back pain of lumbar region with sciatica 04/02/2022   Arthralgia 04/02/2022   Fatigue due to excessive  exertion 01/31/2022   Weight gain 01/31/2022   Penicillin allergy    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 Haley Singh is a 72 y/o female presenting to PM&R clinic as a referral from her PCP Dr. Reed Breech for pain s/p osteomyelitis . She states when they made the referral, she was in pain but that is no longer the case. She has persistent fatigue, which is the most bothersome thing for her. She also has occassional muscle spasms, but these self resolved.    Fatigue: Has been ongoing and debilitating over the past year. She has not gone to PT, had any medications, or other treatments specifically for fatigue. Does endorse family Hx hypothyroidism, but no personal Hx. Has had some mild weight gain with inactivity. She also endorses poor tolerance of hot weather, but denies any fatigue or pain associated with it. She denies any associated weakness, sensory changes, pain, diplopia or pruritis.    Pain: Her osteomyelitis was at L2-3, and she also has a bulging disc at L2. She was taking oxycodone at the time 2-3x daily in acute phase, but has been off of it since January 2023. She has been getting massages every few weeks to stretch her Psoas, which is helping with her posture.    Function/social: Lives in a 3 story town home by [employer]. Used to work part time at [employer], currently unemployed. She used to do woodworking and photography, but does not anymore because heat tends to make her fatigue worse. She used to exercise in her complex pool, but has also stopped doing that due to heat. She is independent of mobility and ADLs.   PRECAUTIONS: None   SUBJECTIVE:                                                                                                                                                                                       SUBJECTIVE STATEMENT:  Rainy weather increases L knee and hip pain.     PAIN:  Are you having pain? Yes: NPRS scale: 4/10 Pain location: L knee/hip Pain description: ache Aggravating factors: activity Relieving factors: rest   OBJECTIVE: (  objective measures completed at initial evaluation unless otherwise dated)   DIAGNOSTIC FINDINGS: IMPRESSION: 1. Sequelae of prior discitis/osteomyelitis at L2-3 with near complete loss of vertebral body height in the anterior 1/3 of the L2 vertebral body and approximately 30% vertebral body height loss posteriorly. There is approximately 50% vertebral body height loss in the posterior 2/3 of the L3 vertebral body. No findings suspicious for new focus of discitis/osteomyelitis. 2. Advanced degenerative changes of the lumbar spine with progression of the degree of spinal canal stenosis, now moderate to severe at L2-3 and severe at L3-4. 3. Severe bilateral neural foraminal narrowing at L2-3. 4. Moderate bilateral neural foraminal narrowing at L1-2 and L3-4. 5. Moderate narrowing of the bilateral subarticular zones and mild-to-moderate right and mild left neural foraminal narrowing at L4-5.   PATIENT SURVEYS:  LEFS 39/80   MUSCLE LENGTH: Thomas test: See ROM   POSTURE:  Marked flexion in B hips   PALPATION: deferred   LOWER EXTREMITY ROM:   Active ROM Right eval Left eval  Hip flexion   120d  Hip extension   -20d(S/L)  Hip abduction   30d  Hip adduction      Hip internal rotation      Hip external rotation      Knee flexion   130d  Knee extension   -2d  Ankle dorsiflexion   10d  Ankle plantarflexion      Ankle inversion      Ankle eversion       (Blank rows = not tested)   LOWER EXTREMITY MMT:   MMT Right eval Left eval  Hip flexion   4  Hip extension   4  Hip abduction   4  Hip adduction      Hip internal rotation      Hip external rotation      Knee flexion   4  Knee extension   4  Ankle dorsiflexion       Ankle plantarflexion   4  Ankle inversion      Ankle eversion       (Blank rows = not tested)   LOWER EXTREMITY SPECIAL TESTS:  Hip special tests: Saralyn Pilar (FABER) test: negative, Thomas test: positive , Ober's test: negative, Ely's test: negative, Hip scouring test: negative, and Anterior hip impingement test: negative   FUNCTIONAL TESTS:  5 times sit to stand: 11s arms crossed from high table   GAIT: Distance walked: 75 ftx2  Assistive device utilized: Single point cane Level of assistance: Modified independence Comments: antalgic     TODAY'S TREATMENT:      [facility] Adult PT Treatment:                                                DATE: 08/09/22 Therapeutic Exercise: Nustep L4 8 min L hip flexor stretch 30s x2 L SLR 15x B SAQ' s 15x Bridge 15x Supine march 15/15 (L knee pain) S/L clam L 15x Supine hip fallouts RTB B 15x, unilateral 15x ea FAQs with adduction 15x PF against wall, hips forward 15x Marching against wall hips forward 15/15  DATE: 08/03/22 Eval and HEP     PATIENT EDUCATION:  Education details: Discussed eval findings, rehab rationale and POC and patient is in agreement  Person educated: Patient Education method: Explanation and Handouts Education comprehension: verbalized understanding   HOME EXERCISE PROGRAM: Access Code: [email or web address] URL: [email or web address] Date: 08/09/2022 Prepared by: Sharlynn Oliphant  Exercises - Clamshell  - 2 x daily - 5 x weekly - 2 sets - 15 reps - Hip Flexor Stretch at Edge of Bed  - 2 x daily - 5 x weekly - 2 sets - 15 reps - 30s hold - Small Range Straight Leg Raise  - 2 x daily - 5 x weekly - 2 sets - 15 reps - Hooklying Single Leg Bent Knee Fallouts with Resistance  - 2 x daily - 5 x weekly - 2 sets - 15 reps   ASSESSMENT:   CLINICAL IMPRESSION: Today's session resumed L hip stretch and  strength tasks to tolerance.  Included aerobic work, stretching, abductor and hip flexor strengthening, quad strengthening.  Incorporated BLE tasks in an effort to alleviate L hip/knee pain.  Able to tolerate all tasks with only minimal symptom increase in L knee.  Patient is a 72 y.o. female who was seen today for physical therapy evaluation and treatment for L hip pain, ROM defcicits, and abnormal gait. Patient demos a tight R hip capsule but good strength, marked restrictions in B hip flexor musculature.  Unable to accurately assess ITB mobility due to tight hip flexors and concurrent L knee pain.  Hip scouring and impingement tests unremarkable.   OBJECTIVE IMPAIRMENTS: Abnormal gait, decreased activity tolerance, decreased endurance, decreased mobility, difficulty walking, decreased ROM, decreased strength, impaired flexibility, improper body mechanics, postural dysfunction, and pain.    ACTIVITY LIMITATIONS: carrying, lifting, standing, sleeping, and bed mobility   PERSONAL FACTORS: Age, Fitness, Past/current experiences, Time since onset of injury/illness/exacerbation, and 1 comorbidity: osteomyelitis  are also affecting patient's functional outcome.    REHAB POTENTIAL: Fair based on previous therapy episodes to address issues   CLINICAL DECISION MAKING: Evolving/moderate complexity   EVALUATION COMPLEXITY: Low     GOALS: Goals reviewed with patient? Yes   SHORT TERM GOALS: Target date: 08/17/2022   Patient to demonstrate independence in HEP  Baseline: [id number] Goal status: INITIAL     LONG TERM GOALS: Target date: 08/31/2022     LEFS score 49/80 Baseline: 39/80 Goal status: INITIAL   2.  Increase L hip extension to -10d Baseline: -20d Goal status: INITIAL   3.  Increase L hip strength to 4+/5 Baseline:  MMT Right eval Left eval  Hip flexion   4  Hip extension   4  Hip abduction   4    Goal status: INITIAL   4.  279f ambulation w/o need of cane Baseline: 716f with minimal use of cane Goal status: INITIAL         PLAN:   PT FREQUENCY: 2x/week   PT DURATION: 4 weeks   PLANNED INTERVENTIONS: Therapeutic exercises, Therapeutic activity, Neuromuscular re-education, Balance training, Gait training, Patient/Family education, Self Care, Joint mobilization, Stair training, Manual therapy, and Re-evaluation   PLAN FOR NEXT SESSION: HEP review and update,   L hip strengthening and stretching, gait training, functional training, aerobic work   JeLanice ShirtsPT 08/09/2022, 2:15 PM

## 2022-08-15 ENCOUNTER — Ambulatory Visit: Payer: Medicare Other

## 2022-08-15 DIAGNOSIS — M6281 Muscle weakness (generalized): Secondary | ICD-10-CM

## 2022-08-15 DIAGNOSIS — R2689 Other abnormalities of gait and mobility: Secondary | ICD-10-CM

## 2022-08-15 DIAGNOSIS — M25552 Pain in left hip: Secondary | ICD-10-CM | POA: Diagnosis not present

## 2022-08-15 NOTE — Therapy (Signed)
OUTPATIENT PHYSICAL THERAPY TREATMENT NOTE   Patient Name: Haley Singh MRN: [id number] DOB:1950/11/18, 72 y.o., female Today's Date: 08/15/2022  PCP: Katherina Mires, MD   REFERRING PROVIDER:Norris, Richardson Landry, MD    END OF SESSION:   PT End of Session - 08/15/22 1438     Visit Number 3    Number of Visits 8    Date for PT Re-Evaluation 09/28/22    Authorization Type [employer]    PT Start Time 1435    PT Stop Time [id number]    PT Time Calculation (min) 40 min    Activity Tolerance Patient tolerated treatment well    Behavior During Therapy WFL for tasks assessed/performed              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   Hyperlipidemia    Hypertension    followed by pcp  (11-10-2019 pt stated never had a stress test)   Insomnia    MDD (major depressive disorder)    Mild persistent asthma    followed by pcp and dr Bernita Raisin (pulmonologist)   RLS (restless legs syndrome)    Vertebral osteomyelitis ([place]) 02/21/2021   Past Surgical History:  Procedure Laterality Date   BUBBLE STUDY  01/25/2021   Procedure: BUBBLE STUDY;  Surgeon: Buford Dresser, MD;  Location: [facility];  Service: Cardiovascular;;   CATARACT EXTRACTION W/ INTRAOCULAR LENS  IMPLANT, BILATERAL  2009; 2010   CYSTOSCOPY/URETEROSCOPY/HOLMIUM LASER/STENT PLACEMENT Right 08/26/2019   Procedure: CYSTOSCOPY/RETROGRADE/URETEROSCOPY/HOLMIUM LASER/STENT PLACEMENT;  Surgeon: Ceasar Mons, MD;  Location: [facility];  Service: Urology;  Laterality: Right;   CYSTOSCOPY/URETEROSCOPY/HOLMIUM LASER/STENT PLACEMENT Left 11/17/2019   Procedure: CYSTOSCOPY/URETEROSCOPY, RETROGRADE,LASER LITHOTRIPSY, STENT PLACEMENT;  Surgeon: Ceasar Mons, MD;  Location: [facility];  Service: Urology;  Laterality: Left;   CYSTOSCOPY/URETEROSCOPY/HOLMIUM LASER/STENT PLACEMENT Left 02/19/2021   Procedure: CYSTOSCOPY/URETEROSCOPY/HOLMIUM LASER/STENT PLACEMENT;  Surgeon: Lucas Mallow, MD;  Location: [facility];  Service: Urology;  Laterality: Left;   EXPLORATORY LAPAROTOMY W/ BOWEL RESECTION  01-15-2004  '@[facility]'$    small bowel resection for stricture/ benzoar   EXTRACORPOREAL SHOCK WAVE LITHOTRIPSY Right 03/27/2018   Procedure: EXTRACORPOREAL SHOCK WAVE LITHOTRIPSY ([facility]);  Surgeon: Ardis Hughs, MD;  Location: [facility];  Service: Urology;  Laterality: Right;   EXTRACORPOREAL SHOCK WAVE LITHOTRIPSY  x2 2008;  2009;  2015   HOLMIUM LASER APPLICATION Left [id number]   Procedure: HOLMIUM LASER APPLICATION;  Surgeon: Ceasar Mons, MD;  Location: [facility];  Service: Urology;  Laterality: Left;   IR FLUORO GUIDED NEEDLE PLC ASPIRATION/INJECTION LOC  02/22/2021   ORIF ANKLE FRACTURE Right 09/ 2008 '@[facility]'$    per pt has retained hardware   TEE WITHOUT CARDIOVERSION N/A 01/25/2021   Procedure: TRANSESOPHAGEAL ECHOCARDIOGRAM (TEE);  Surgeon: Buford Dresser, MD;  Location: [facility];  Service: Cardiovascular;  Laterality: N/A;   TONSILLECTOMY  age 56   Patient Active Problem List   Diagnosis Date Noted   Chronic pain of right knee 07/16/2022   Groin pain, left 04/23/2022   Spinal stenosis of lumbar region 04/23/2022   Back pain of lumbar region with sciatica 04/02/2022   Arthralgia 04/02/2022   Fatigue due to  excessive exertion 01/31/2022   Weight gain 01/31/2022   Penicillin allergy    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 is a 72 y/o female presenting to PM&R clinic as a referral from her PCP Dr. Reed Breech for pain s/p osteomyelitis . She states when they made the referral, she was in pain but that is no longer the case. She has persistent fatigue, which is the most bothersome thing for her. She also has occassional muscle spasms, but these self resolved.    Fatigue: Has been ongoing and debilitating over the past year. She has not gone to PT, had any medications, or other treatments specifically for fatigue. Does endorse family Hx hypothyroidism, but no personal Hx. Has had some mild weight gain with inactivity. She also endorses poor tolerance of hot weather, but denies any fatigue or pain associated with it. She denies any associated weakness, sensory changes, pain, diplopia or pruritis.    Pain: Her osteomyelitis was at L2-3, and she also has a bulging disc at L2. She was taking oxycodone at the time 2-3x daily in acute phase, but has been off of it since January 2023. She has been getting massages every few weeks to stretch her Psoas, which is helping with her posture.    Function/social: Lives in a 3 story town home by [employer]. Used to work part time at [employer], currently unemployed. She used to do woodworking and photography, but does not anymore because heat tends to make her fatigue worse. She used to exercise in her complex pool, but has also stopped doing that due to heat. She is independent of mobility and ADLs.   PRECAUTIONS: None   SUBJECTIVE:                                                                                                                                                                                       SUBJECTIVE STATEMENT: Patient reports increased pain in Lt knee today, states she did a lot of walking yesterday.    PAIN:  Are you having pain? Yes: NPRS scale: 2-3/10 Pain location: L knee/hip Pain description: ache Aggravating factors:  activity Relieving factors: rest   OBJECTIVE: (objective measures completed at initial evaluation unless otherwise dated)   DIAGNOSTIC FINDINGS: IMPRESSION: 1. Sequelae of prior discitis/osteomyelitis at L2-3 with near complete loss of vertebral body height in the anterior 1/3 of the L2 vertebral body and approximately 30% vertebral body height loss posteriorly. There is approximately 50% vertebral body height loss in the posterior 2/3 of the L3 vertebral body. No findings suspicious for new focus of discitis/osteomyelitis. 2. Advanced degenerative changes of the lumbar spine with progression of the degree of spinal canal stenosis, now moderate to severe at L2-3 and severe at L3-4. 3. Severe bilateral neural foraminal narrowing at L2-3. 4. Moderate bilateral neural foraminal narrowing at L1-2 and L3-4. 5. Moderate narrowing of the bilateral subarticular zones and mild-to-moderate right and mild left neural foraminal narrowing at L4-5.   PATIENT SURVEYS:  LEFS 39/80   MUSCLE LENGTH: Thomas test: See ROM   POSTURE:  Marked flexion in B hips   PALPATION: deferred   LOWER EXTREMITY ROM:   Active ROM Right eval Left eval  Hip flexion   120d  Hip extension   -20d(S/L)  Hip abduction   30d  Hip adduction      Hip internal rotation      Hip external rotation      Knee flexion   130d  Knee extension   -2d  Ankle dorsiflexion   10d  Ankle plantarflexion      Ankle inversion      Ankle eversion       (Blank rows = not tested)   LOWER EXTREMITY MMT:   MMT Right eval Left eval  Hip flexion   4  Hip extension   4  Hip abduction   4  Hip adduction      Hip internal rotation      Hip external rotation       Knee flexion   4  Knee extension   4  Ankle dorsiflexion      Ankle plantarflexion   4  Ankle inversion      Ankle eversion       (Blank rows = not tested)   LOWER EXTREMITY SPECIAL TESTS:  Hip special tests: Saralyn Pilar (FABER) test: negative, Thomas test: positive , Ober's test: negative, Ely's test: negative, Hip scouring test: negative, and Anterior hip impingement test: negative   FUNCTIONAL TESTS:  5 times sit to stand: 11s arms crossed from high table   GAIT: Distance walked: 75 ftx2  Assistive device utilized: Single point cane Level of assistance: Modified independence Comments: antalgic     TODAY'S TREATMENT:      [facility] Adult PT Treatment:                                                DATE: 08/15/22 Therapeutic Exercise: Nustep L4 8 min L hip flexor stretch 30s x2 BIL SLR 15x B SAQ' s 15x Bridge 15x Supine march 15/15 (L knee pain) S/L clam L 15x FAQs with adduction 15x PF against wall, hips forward 15x Marching against wall hips forward 15/15    [facility] Adult PT Treatment:  DATE: 08/09/22 Therapeutic Exercise: Nustep L4 8 min L hip flexor stretch 30s x2 L SLR 15x B SAQ' s 15x Bridge 15x Supine march 15/15 (L knee pain) S/L clam L 15x Supine hip fallouts RTB B 15x, unilateral 15x ea FAQs with adduction 15x PF against wall, hips forward 15x Marching against wall hips forward 15/15                                                                                                                         DATE: 08/03/22 Eval and HEP     PATIENT EDUCATION:  Education details: Discussed eval findings, rehab rationale and POC and patient is in agreement  Person educated: Patient Education method: Explanation and Handouts Education comprehension: verbalized understanding   HOME EXERCISE PROGRAM: Access Code: [email or web address] URL: [email or web address] Date: 08/09/2022 Prepared by: Sharlynn Oliphant  Exercises -  Clamshell  - 2 x daily - 5 x weekly - 2 sets - 15 reps - Hip Flexor Stretch at Edge of Bed  - 2 x daily - 5 x weekly - 2 sets - 15 reps - 30s hold - Small Range Straight Leg Raise  - 2 x daily - 5 x weekly - 2 sets - 15 reps - Hooklying Single Leg Bent Knee Fallouts with Resistance  - 2 x daily - 5 x weekly - 2 sets - 15 reps   ASSESSMENT:   CLINICAL IMPRESSION:  Patient presents to PT with increased pain in her Lt knee today, attributing to increased walking yesterday. Session today continued to focus on proximal hip and LE strengthening as well as stretching. Patient was able to tolerate all prescribed exercises with occasional rest breaks for shortness of breath. Patient continues to benefit from skilled PT services and should be progressed as able to improve functional independence.      OBJECTIVE IMPAIRMENTS: Abnormal gait, decreased activity tolerance, decreased endurance, decreased mobility, difficulty walking, decreased ROM, decreased strength, impaired flexibility, improper body mechanics, postural dysfunction, and pain.    ACTIVITY LIMITATIONS: carrying, lifting, standing, sleeping, and bed mobility   PERSONAL FACTORS: Age, Fitness, Past/current experiences, Time since onset of injury/illness/exacerbation, and 1 comorbidity: osteomyelitis  are also affecting patient's functional outcome.    REHAB POTENTIAL: Fair based on previous therapy episodes to address issues   CLINICAL DECISION MAKING: Evolving/moderate complexity   EVALUATION COMPLEXITY: Low     GOALS: Goals reviewed with patient? Yes   SHORT TERM GOALS: Target date: 08/17/2022   Patient to demonstrate independence in HEP  Baseline: [email or web address] Goal status: MET Pr reports adherence 08/15/22     LONG TERM GOALS: Target date: 08/31/2022     LEFS score 49/80 Baseline: 39/80 Goal status: INITIAL   2.  Increase L hip extension to -10d Baseline: -20d Goal status: INITIAL   3.  Increase L hip strength to  4+/5 Baseline:  MMT Right eval Left eval  Hip flexion   4  Hip extension   4  Hip abduction   4  Goal status: INITIAL   4.  260f ambulation w/o need of cane Baseline: 725fwith minimal use of cane Goal status: INITIAL         PLAN:   PT FREQUENCY: 2x/week   PT DURATION: 4 weeks   PLANNED INTERVENTIONS: Therapeutic exercises, Therapeutic activity, Neuromuscular re-education, Balance training, Gait training, Patient/Family education, Self Care, Joint mobilization, Stair training, Manual therapy, and Re-evaluation   PLAN FOR NEXT SESSION: HEP review and update,   L hip strengthening and stretching, gait training, functional training, aerobic work   StMirantPTA 08/15/2022, 3:13 PM

## 2022-08-17 ENCOUNTER — Ambulatory Visit: Payer: Medicare Other

## 2022-08-17 DIAGNOSIS — M6281 Muscle weakness (generalized): Secondary | ICD-10-CM

## 2022-08-17 DIAGNOSIS — M25552 Pain in left hip: Secondary | ICD-10-CM | POA: Diagnosis not present

## 2022-08-17 DIAGNOSIS — R2689 Other abnormalities of gait and mobility: Secondary | ICD-10-CM

## 2022-08-17 NOTE — Therapy (Signed)
OUTPATIENT PHYSICAL THERAPY TREATMENT NOTE   Patient Name: Haley Singh MRN: [id number] DOB:December 23, 1950, 72 y.o., female Today's Date: 08/17/2022  PCP: Katherina Mires, MD   REFERRING PROVIDER:Norris, Richardson Landry, MD    END OF SESSION:   PT End of Session - 08/17/22 0915     Visit Number 4    Number of Visits 8    Date for PT Re-Evaluation 09/28/22    Authorization Type [employer]    PT Start Time 0915    PT Stop Time 0955    PT Time Calculation (min) 40 min    Activity Tolerance Patient tolerated treatment well    Behavior During Therapy 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   Hyperlipidemia    Hypertension    followed by pcp  (11-10-2019 pt stated never had a stress test)   Insomnia    MDD (major depressive disorder)    Mild persistent asthma    followed by pcp and dr Bernita Raisin (pulmonologist)   RLS (restless legs syndrome)    Vertebral osteomyelitis ([place]) 02/21/2021   Past Surgical History:  Procedure Laterality Date   BUBBLE STUDY  01/25/2021   Procedure: BUBBLE STUDY;  Surgeon: Buford Dresser, MD;  Location: [place];  Service: Cardiovascular;;   CATARACT EXTRACTION W/ INTRAOCULAR LENS  IMPLANT, BILATERAL  2009; 2010   CYSTOSCOPY/URETEROSCOPY/HOLMIUM LASER/STENT PLACEMENT Right 08/26/2019   Procedure: CYSTOSCOPY/RETROGRADE/URETEROSCOPY/HOLMIUM LASER/STENT PLACEMENT;  Surgeon: Ceasar Mons, MD;  Location: [facility];  Service: Urology;  Laterality: Right;   CYSTOSCOPY/URETEROSCOPY/HOLMIUM LASER/STENT PLACEMENT Left 11/17/2019   Procedure: CYSTOSCOPY/URETEROSCOPY, RETROGRADE,LASER LITHOTRIPSY, STENT PLACEMENT;  Surgeon: Ceasar Mons, MD;  Location: [facility];  Service: Urology;  Laterality: Left;   CYSTOSCOPY/URETEROSCOPY/HOLMIUM LASER/STENT PLACEMENT Left 02/19/2021   Procedure: CYSTOSCOPY/URETEROSCOPY/HOLMIUM LASER/STENT PLACEMENT;  Surgeon: Lucas Mallow, MD;  Location: [facility];  Service: Urology;  Laterality: Left;   EXPLORATORY LAPAROTOMY W/ BOWEL RESECTION  01-15-2004  @[facility]    small bowel resection for stricture/ benzoar   EXTRACORPOREAL SHOCK WAVE LITHOTRIPSY Right 03/27/2018   Procedure: EXTRACORPOREAL SHOCK WAVE LITHOTRIPSY (ESWL);  Surgeon: Ardis Hughs, MD;  Location: [facility];  Service: Urology;  Laterality: Right;   EXTRACORPOREAL SHOCK WAVE LITHOTRIPSY  x2 2008;  2009;  2015   HOLMIUM LASER APPLICATION Left [id number]   Procedure: HOLMIUM LASER APPLICATION;  Surgeon: Ceasar Mons, MD;  Location: [facility];  Service: Urology;  Laterality: Left;   IR FLUORO GUIDED NEEDLE PLC ASPIRATION/INJECTION LOC  02/22/2021   ORIF ANKLE FRACTURE Right 09/ 2008 @[facility]    per pt has retained hardware   TEE WITHOUT CARDIOVERSION N/A 01/25/2021   Procedure: TRANSESOPHAGEAL ECHOCARDIOGRAM (TEE);  Surgeon: Buford Dresser, MD;  Location: 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   Vertebral osteomyelitis ([place]) 02/21/2021   Intractable nausea and vomiting 02/18/2021   Hypokalemia 02/18/2021   MSSA bacteremia    Discitis of lumbar region    Osteomyelitis ([place]) 01/17/2021   Mild persistent asthma without complication [id number]   OSA (obstructive  sleep apnea) 03/03/2014    REFERRING DIAG: M16.12 (ICD-10-CM) - Unilateral primary osteoarthritis, left hip   THERAPY DIAG:  Pain in left hip  Muscle weakness (generalized)  Other abnormalities of gait and mobility  Rationale for Evaluation and Treatment Rehabilitation  PERTINENT HISTORY: Haley Singh is a 72 y/o female presenting to PM&R clinic as a referral from her PCP Dr. Reed Breech for pain s/p osteomyelitis . She states when they made the referral, she was in pain but that is no longer the case. She has persistent fatigue, which is the most bothersome thing for her. She also has occassional muscle spasms, but these self resolved.    Fatigue: Has been ongoing and debilitating over the past year. She has not gone to PT, had any medications, or other treatments specifically for fatigue. Does endorse family Hx hypothyroidism, but no personal Hx. Has had some mild weight gain with inactivity. She also endorses poor tolerance of hot weather, but denies any fatigue or pain associated with it. She denies any associated weakness, sensory changes, pain, diplopia or pruritis.    Pain: Her osteomyelitis was at L2-3, and she also has a bulging disc at L2. She was taking oxycodone at the time 2-3x daily in acute phase, but has been off of it since January 2023. She has been getting massages every few weeks to stretch her Psoas, which is helping with her posture.    Function/social: Lives in a 3 story town home by [employer]. Used to work part time at [employer], currently unemployed. She used to do woodworking and photography, but does not anymore because heat tends to make her fatigue worse. She used to exercise in her complex pool, but has also stopped doing that due to heat. She is independent of mobility and ADLs.   PRECAUTIONS: None   SUBJECTIVE:                                                                                                                                                                                       SUBJECTIVE STATEMENT: Describes B LE soreness following gardening work.  Denies distinct L knee or hip pain.   PAIN:  Are you having pain? Yes: NPRS scale: 2-3/10 Pain location: L knee/hip Pain description: ache Aggravating factors: activity Relieving  factors: rest   OBJECTIVE: (objective measures completed at initial evaluation unless otherwise dated)   DIAGNOSTIC FINDINGS: IMPRESSION: 1. Sequelae of prior discitis/osteomyelitis at L2-3 with near complete loss of vertebral body height in the anterior 1/3 of the L2 vertebral body and approximately 30% vertebral body height loss posteriorly. There is approximately 50% vertebral body height loss in the posterior 2/3 of the L3 vertebral body. No findings suspicious for new focus of discitis/osteomyelitis. 2. Advanced degenerative changes of the lumbar spine with progression of the degree of spinal canal stenosis, now moderate to severe at L2-3 and severe at L3-4. 3. Severe bilateral neural foraminal narrowing at L2-3. 4. Moderate bilateral neural foraminal narrowing at L1-2 and L3-4. 5. Moderate narrowing of the bilateral subarticular zones and mild-to-moderate right and mild left neural foraminal narrowing at L4-5.   PATIENT SURVEYS:  LEFS 39/80   MUSCLE LENGTH: Thomas test: See ROM   POSTURE:  Marked flexion in B hips   PALPATION: deferred   LOWER EXTREMITY ROM:   Active ROM Right eval Left eval  Hip flexion   120d  Hip extension   -20d(S/L)  Hip abduction   30d  Hip adduction      Hip internal rotation      Hip external rotation      Knee flexion   130d  Knee extension   -2d  Ankle dorsiflexion   10d  Ankle plantarflexion      Ankle inversion      Ankle eversion       (Blank rows = not tested)   LOWER EXTREMITY MMT:   MMT Right eval Left eval  Hip flexion   4  Hip extension   4  Hip abduction   4  Hip adduction      Hip internal rotation      Hip external rotation      Knee  flexion   4  Knee extension   4  Ankle dorsiflexion      Ankle plantarflexion   4  Ankle inversion      Ankle eversion       (Blank rows = not tested)   LOWER EXTREMITY SPECIAL TESTS:  Hip special tests: Saralyn Pilar (FABER) test: negative, Thomas test: positive , Ober's test: negative, Ely's test: negative, Hip scouring test: negative, and Anterior hip impingement test: negative   FUNCTIONAL TESTS:  5 times sit to stand: 11s arms crossed from high table   GAIT: Distance walked: 75 ftx2  Assistive device utilized: Single point cane Level of assistance: Modified independence Comments: antalgic     TODAY'S TREATMENT:      [facility] Adult PT Treatment:                                                DATE: 08/17/22 Therapeutic Exercise: Nustep L4 8 min L hip flexor stretch 30s x2 BIL SLR 15x 2# B SAQ' s 15x 2# alternating Bridge 15x Supine march 15/15 2# Supine hip fallouts GTB 25x B, 15/15 unilaterally FAQs with adduction 15x PF against wall, hips forward 15x Marching against wall hips forward 15/15   [facility] Adult PT Treatment:  DATE: 08/15/22 Therapeutic Exercise: Nustep L4 8 min L hip flexor stretch 30s x2 BIL SLR 15x B SAQ' s 15x Bridge 15x Supine march 15/15 (L knee pain) S/L clam L 15x FAQs with adduction 15x PF against wall, hips forward 15x Marching against wall hips forward 15/15    [facility] Adult PT Treatment:                                                DATE: 08/09/22 Therapeutic Exercise: Nustep L4 8 min L hip flexor stretch 30s x2 L SLR 15x B SAQ' s 15x Bridge 15x Supine march 15/15 (L knee pain) S/L clam L 15x Supine hip fallouts RTB B 15x, unilateral 15x ea FAQs with adduction 15x PF against wall, hips forward 15x Marching against wall hips forward 15/15                                                                                                                         DATE: 08/03/22 Eval and HEP     PATIENT  EDUCATION:  Education details: Discussed eval findings, rehab rationale and POC and patient is in agreement  Person educated: Patient Education method: Explanation and Handouts Education comprehension: verbalized understanding   HOME EXERCISE PROGRAM: Access Code: [email or web address] URL: [email or web address] Date: 08/09/2022 Prepared by: Sharlynn Oliphant  Exercises - Clamshell  - 2 x daily - 5 x weekly - 2 sets - 15 reps - Hip Flexor Stretch at Edge of Bed  - 2 x daily - 5 x weekly - 2 sets - 15 reps - 30s hold - Small Range Straight Leg Raise  - 2 x daily - 5 x weekly - 2 sets - 15 reps - Hooklying Single Leg Bent Knee Fallouts with Resistance  - 2 x daily - 5 x weekly - 2 sets - 15 reps   ASSESSMENT:   CLINICAL IMPRESSION: Able to advance to 2# for weighted tasks w/o aggravating symptoms.  L knee pain appears drive all LLE symptoms and restrictions.  Will consider compression socks to address edema      OBJECTIVE IMPAIRMENTS: Abnormal gait, decreased activity tolerance, decreased endurance, decreased mobility, difficulty walking, decreased ROM, decreased strength, impaired flexibility, improper body mechanics, postural dysfunction, and pain.    ACTIVITY LIMITATIONS: carrying, lifting, standing, sleeping, and bed mobility   PERSONAL FACTORS: Age, Fitness, Past/current experiences, Time since onset of injury/illness/exacerbation, and 1 comorbidity: osteomyelitis  are also affecting patient's functional outcome.    REHAB POTENTIAL: Fair based on previous therapy episodes to address issues   CLINICAL DECISION MAKING: Evolving/moderate complexity   EVALUATION COMPLEXITY: Low     GOALS: Goals reviewed with patient? Yes   SHORT TERM GOALS: Target date: 08/17/2022   Patient to demonstrate independence in HEP  Baseline: [email or web address] Goal status: MET Pr reports adherence 08/15/22     LONG TERM GOALS:  Target date: 08/31/2022     LEFS score 49/80 Baseline: 39/80 Goal status:  INITIAL   2.  Increase L hip extension to -10d Baseline: -20d Goal status: INITIAL   3.  Increase L hip strength to 4+/5 Baseline:  MMT Right eval Left eval  Hip flexion   4  Hip extension   4  Hip abduction   4    Goal status: INITIAL   4.  232ft ambulation w/o need of cane Baseline: 59ft with minimal use of cane Goal status: INITIAL         PLAN:   PT FREQUENCY: 2x/week   PT DURATION: 4 weeks   PLANNED INTERVENTIONS: Therapeutic exercises, Therapeutic activity, Neuromuscular re-education, Balance training, Gait training, Patient/Family education, Self Care, Joint mobilization, Stair training, Manual therapy, and Re-evaluation   PLAN FOR NEXT SESSION: HEP review and update,   L hip strengthening and stretching, gait training, functional training, aerobic work   Lanice Shirts, PT 08/17/2022, 10:00 AM

## 2022-08-21 ENCOUNTER — Ambulatory Visit: Payer: Medicare Other

## 2022-08-21 DIAGNOSIS — M6281 Muscle weakness (generalized): Secondary | ICD-10-CM

## 2022-08-21 DIAGNOSIS — M25552 Pain in left hip: Secondary | ICD-10-CM | POA: Diagnosis not present

## 2022-08-21 NOTE — Therapy (Signed)
OUTPATIENT PHYSICAL THERAPY TREATMENT NOTE   Patient Name: Haley Singh MRN: [id number] DOB:[birth year], 72 y.o., female Today's Date: 08/21/2022  PCP: Katherina Mires, MD   REFERRING PROVIDER:Norris, Richardson Landry, MD    END OF SESSION:   PT End of Session - 08/21/22 1344     Visit Number 5    Number of Visits 8    Date for PT Re-Evaluation 09/28/22    Authorization Type [employer]    PT Start Time 1400    PT Stop Time [id number]    PT Time Calculation (min) 42 min    Activity Tolerance Patient tolerated treatment well    Behavior During Therapy WFL for tasks assessed/performed               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   Hyperlipidemia    Hypertension    followed by pcp  (11-10-2019 pt stated never had a stress test)   Insomnia    MDD (major depressive disorder)    Mild persistent asthma    followed by pcp and dr Bernita Raisin (pulmonologist)   RLS (restless legs syndrome)    Vertebral osteomyelitis ([place]) 02/21/2021   Past Surgical History:  Procedure Laterality Date   BUBBLE STUDY  01/25/2021   Procedure: BUBBLE STUDY;  Surgeon: Buford Dresser, MD;  Location: [facility];  Service: Cardiovascular;;   CATARACT EXTRACTION W/ INTRAOCULAR LENS  IMPLANT, BILATERAL  2009; 2010   CYSTOSCOPY/URETEROSCOPY/HOLMIUM LASER/STENT PLACEMENT Right 08/26/2019   Procedure: CYSTOSCOPY/RETROGRADE/URETEROSCOPY/HOLMIUM LASER/STENT PLACEMENT;  Surgeon: Ceasar Mons, MD;  Location: [facility];  Service: Urology;  Laterality: Right;   CYSTOSCOPY/URETEROSCOPY/HOLMIUM LASER/STENT PLACEMENT Left 11/17/2019   Procedure: CYSTOSCOPY/URETEROSCOPY, RETROGRADE,LASER LITHOTRIPSY, STENT PLACEMENT;  Surgeon: Ceasar Mons, MD;  Location: [facility];  Service: Urology;  Laterality: Left;   CYSTOSCOPY/URETEROSCOPY/HOLMIUM LASER/STENT PLACEMENT Left 02/19/2021   Procedure: CYSTOSCOPY/URETEROSCOPY/HOLMIUM LASER/STENT PLACEMENT;  Surgeon: Lucas Mallow, MD;  Location: [facility];  Service: Urology;  Laterality: Left;   EXPLORATORY LAPAROTOMY W/ BOWEL RESECTION  01-15-2004  @[facility]    small bowel resection for stricture/ benzoar   EXTRACORPOREAL SHOCK WAVE LITHOTRIPSY Right 03/27/2018   Procedure: EXTRACORPOREAL SHOCK WAVE LITHOTRIPSY (ESWL);  Surgeon: Ardis Hughs, MD;  Location: [facility];  Service: Urology;  Laterality: Right;   EXTRACORPOREAL SHOCK WAVE LITHOTRIPSY  x2 2008;  2009;  2015   HOLMIUM LASER APPLICATION Left [id number]   Procedure: HOLMIUM LASER APPLICATION;  Surgeon: Ceasar Mons, MD;  Location: [facility];  Service: Urology;  Laterality: Left;   IR FLUORO GUIDED NEEDLE PLC ASPIRATION/INJECTION LOC  02/22/2021   ORIF ANKLE FRACTURE Right 09/ 2008 @[facility]    per pt has retained hardware   TEE WITHOUT CARDIOVERSION N/A 01/25/2021   Procedure: TRANSESOPHAGEAL ECHOCARDIOGRAM (TEE);  Surgeon: Buford Dresser, MD;  Location: [facility];  Service: Cardiovascular;  Laterality: N/A;   TONSILLECTOMY  age 63   Patient Active Problem List   Diagnosis Date Noted   Chronic pain of right knee 07/16/2022   Groin pain, left 04/23/2022   Spinal stenosis of lumbar region 04/23/2022   Back pain of lumbar region with sciatica 04/02/2022   Arthralgia 04/02/2022   Fatigue due  to excessive exertion 01/31/2022   Weight gain 01/31/2022   Penicillin allergy    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 Heggie is a 72 y/o female presenting to PM&R clinic as a referral from her PCP Dr. Reed Breech for pain s/p osteomyelitis . She states when they made the referral, she was in pain but that is no longer the case. She has persistent fatigue, which is the most bothersome thing for her. She also has occassional muscle spasms, but these self resolved.    Fatigue: Has been ongoing and debilitating over the past year. She has not gone to PT, had any medications, or other treatments specifically for fatigue. Does endorse family Hx hypothyroidism, but no personal Hx. Has had some mild weight gain with inactivity. She also endorses poor tolerance of hot weather, but denies any fatigue or pain associated with it. She denies any associated weakness, sensory changes, pain, diplopia or pruritis.    Pain: Her osteomyelitis was at L2-3, and she also has a bulging disc at L2. She was taking oxycodone at the time 2-3x daily in acute phase, but has been off of it since January 2023. She has been getting massages every few weeks to stretch her Psoas, which is helping with her posture.    Function/social: Lives in a 3 story town home by [employer]. Used to work part time at [employer], currently unemployed. She used to do woodworking and photography, but does not anymore because heat tends to make her fatigue worse. She used to exercise in her complex pool, but has also stopped doing that due to heat. She is independent of mobility and ADLs.   PRECAUTIONS: None   SUBJECTIVE:                                                                                                                                                                                      SUBJECTIVE  STATEMENT: Pt presents to PT with reports of continued L LE pain. Has continued HEP compliance.    PAIN:  Are you having pain? Yes: NPRS scale: 2-3/10 Pain location: L knee/hip Pain description: ache Aggravating factors: activity Relieving factors: rest   OBJECTIVE: (  objective measures completed at initial evaluation unless otherwise dated)   DIAGNOSTIC FINDINGS: IMPRESSION: 1. Sequelae of prior discitis/osteomyelitis at L2-3 with near complete loss of vertebral body height in the anterior 1/3 of the L2 vertebral body and approximately 30% vertebral body height loss posteriorly. There is approximately 50% vertebral body height loss in the posterior 2/3 of the L3 vertebral body. No findings suspicious for new focus of discitis/osteomyelitis. 2. Advanced degenerative changes of the lumbar spine with progression of the degree of spinal canal stenosis, now moderate to severe at L2-3 and severe at L3-4. 3. Severe bilateral neural foraminal narrowing at L2-3. 4. Moderate bilateral neural foraminal narrowing at L1-2 and L3-4. 5. Moderate narrowing of the bilateral subarticular zones and mild-to-moderate right and mild left neural foraminal narrowing at L4-5.   PATIENT SURVEYS:  LEFS 39/80   MUSCLE LENGTH: Thomas test: See ROM   POSTURE:  Marked flexion in B hips   PALPATION: deferred   LOWER EXTREMITY ROM:   Active ROM Right eval Left eval  Hip flexion   120d  Hip extension   -20d(S/L)  Hip abduction   30d  Hip adduction      Hip internal rotation      Hip external rotation      Knee flexion   130d  Knee extension   -2d  Ankle dorsiflexion   10d  Ankle plantarflexion      Ankle inversion      Ankle eversion       (Blank rows = not tested)   LOWER EXTREMITY MMT:   MMT Right eval Left eval  Hip flexion   4  Hip extension   4  Hip abduction   4  Hip adduction      Hip internal rotation      Hip external rotation      Knee flexion   4  Knee extension   4   Ankle dorsiflexion      Ankle plantarflexion   4  Ankle inversion      Ankle eversion       (Blank rows = not tested)   LOWER EXTREMITY SPECIAL TESTS:  Hip special tests: Saralyn Pilar (FABER) test: negative, Thomas test: positive , Ober's test: negative, Ely's test: negative, Hip scouring test: negative, and Anterior hip impingement test: negative   FUNCTIONAL TESTS:  5 times sit to stand: 11s arms crossed from high table   GAIT: Distance walked: 75 ftx2  Assistive device utilized: Single point cane Level of assistance: Modified independence Comments: antalgic     TODAY'S TREATMENT:      [facility] Adult PT Treatment:                                                DATE: 08/21/22 Therapeutic Exercise: Nustep L4 8 min while taking subjective L hip flexor stretch 2x60" BIL SLR 2x15 2# B SAQ' s 2x15 2# Bridge 2x10 Supine hip fallouts GTB 20x B, 15/15 unilaterally LAQ 2x15 2# each  [facility] Adult PT Treatment:                                                DATE: 08/17/22 Therapeutic Exercise: Nustep L4 8 min L hip flexor stretch 30s  x2 BIL SLR 15x 2# B SAQ' s 15x 2# alternating Bridge 15x Supine march 15/15 2# Supine hip fallouts GTB 25x B, 15/15 unilaterally FAQs with adduction 15x PF against wall, hips forward 15x Marching against wall hips forward 15/15   [facility] Adult PT Treatment:                                                DATE: 08/15/22 Therapeutic Exercise: Nustep L4 8 min L hip flexor stretch 30s x2 BIL SLR 15x B SAQ' s 15x Bridge 15x Supine march 15/15 (L knee pain) S/L clam L 15x FAQs with adduction 15x PF against wall, hips forward 15x Marching against wall hips forward 15/15   PATIENT EDUCATION:  Education details: Discussed eval findings, rehab rationale and POC and patient is in agreement  Person educated: Patient Education method: Explanation and Handouts Education comprehension: verbalized understanding   HOME EXERCISE PROGRAM: Access Code: [email or web address] URL:  [email or web address] Date: 08/09/2022 Prepared by: Sharlynn Oliphant  Exercises - Clamshell  - 2 x daily - 5 x weekly - 2 sets - 15 reps - Hip Flexor Stretch at Edge of Bed  - 2 x daily - 5 x weekly - 2 sets - 15 reps - 30s hold - Small Range Straight Leg Raise  - 2 x daily - 5 x weekly - 2 sets - 15 reps - Hooklying Single Leg Bent Knee Fallouts with Resistance  - 2 x daily - 5 x weekly - 2 sets - 15 reps   ASSESSMENT:   CLINICAL IMPRESSION: Pt was able to complete all prescribed exercises with no adverse or increase in pain. Therapy today focused on general LE strengthening in order to decrease pain and improve mobility. She continues to benefit from skilled PT, will continue per POC.   OBJECTIVE IMPAIRMENTS: Abnormal gait, decreased activity tolerance, decreased endurance, decreased mobility, difficulty walking, decreased ROM, decreased strength, impaired flexibility, improper body mechanics, postural dysfunction, and pain.    ACTIVITY LIMITATIONS: carrying, lifting, standing, sleeping, and bed mobility   PERSONAL FACTORS: Age, Fitness, Past/current experiences, Time since onset of injury/illness/exacerbation, and 1 comorbidity: osteomyelitis  are also affecting patient's functional outcome.    REHAB POTENTIAL: Fair based on previous therapy episodes to address issues   CLINICAL DECISION MAKING: Evolving/moderate complexity   EVALUATION COMPLEXITY: Low     GOALS: Goals reviewed with patient? Yes   SHORT TERM GOALS: Target date: 08/17/2022   Patient to demonstrate independence in HEP  Baseline: [email or web address] Goal status: MET Pr reports adherence 08/15/22     LONG TERM GOALS: Target date: 08/31/2022     LEFS score 49/80 Baseline: 39/80 Goal status: INITIAL   2.  Increase L hip extension to -10d Baseline: -20d Goal status: INITIAL   3.  Increase L hip strength to 4+/5 Baseline:  MMT Right eval Left eval  Hip flexion   4  Hip extension   4  Hip abduction   4     Goal status: INITIAL   4.  255ft ambulation w/o need of cane Baseline: 85ft with minimal use of cane Goal status: INITIAL         PLAN:   PT FREQUENCY: 2x/week   PT DURATION: 4 weeks   PLANNED INTERVENTIONS: Therapeutic exercises, Therapeutic activity, Neuromuscular re-education, Balance training, Gait training, Patient/Family education, Self Care, Joint mobilization, Stair training, Manual therapy, and  Re-evaluation   PLAN FOR NEXT SESSION: HEP review and update,   L hip strengthening and stretching, gait training, functional training, aerobic work   Ward Chatters, PT 08/21/2022, 2:43 PM

## 2022-08-22 NOTE — Therapy (Signed)
OUTPATIENT PHYSICAL THERAPY TREATMENT NOTE   Patient Name: Haley Singh MRN: [id number] DOB:07-06-1950, 72 y.o., female Today's Date: 08/23/2022  PCP: Katherina Mires, MD   REFERRING PROVIDER:Norris, Richardson Landry, MD    END OF SESSION:   PT End of Session - 08/23/22 1213     Visit Number 6    Number of Visits 8    Date for PT Re-Evaluation 09/28/22    Authorization Type [employer]    PT Start Time 1215    PT Stop Time 1255    PT Time Calculation (min) 40 min    Activity Tolerance Patient tolerated treatment well    Behavior During Therapy [facility] for tasks assessed/performed                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   Hyperlipidemia    Hypertension    followed by pcp  (11-10-2019 pt stated never had a stress test)   Insomnia    MDD (major depressive disorder)    Mild persistent asthma    followed by pcp and dr Bernita Raisin (pulmonologist)   RLS (restless legs syndrome)    Vertebral osteomyelitis ([place]) 02/21/2021   Past Surgical History:  Procedure Laterality Date   BUBBLE STUDY  01/25/2021   Procedure: BUBBLE STUDY;  Surgeon: Buford Dresser, MD;  Location: [place];  Service: Cardiovascular;;   CATARACT EXTRACTION W/ INTRAOCULAR LENS  IMPLANT, BILATERAL  2009; 2010   CYSTOSCOPY/URETEROSCOPY/HOLMIUM LASER/STENT PLACEMENT Right 08/26/2019   Procedure: CYSTOSCOPY/RETROGRADE/URETEROSCOPY/HOLMIUM LASER/STENT PLACEMENT;  Surgeon: Ceasar Mons, MD;  Location: [facility];  Service: Urology;  Laterality: Right;   CYSTOSCOPY/URETEROSCOPY/HOLMIUM LASER/STENT PLACEMENT Left 11/17/2019   Procedure: CYSTOSCOPY/URETEROSCOPY, RETROGRADE,LASER LITHOTRIPSY, STENT PLACEMENT;  Surgeon: Ceasar Mons, MD;  Location: [facility];  Service: Urology;  Laterality: Left;   CYSTOSCOPY/URETEROSCOPY/HOLMIUM LASER/STENT PLACEMENT Left 02/19/2021   Procedure: CYSTOSCOPY/URETEROSCOPY/HOLMIUM LASER/STENT PLACEMENT;  Surgeon: Lucas Mallow, MD;  Location: [facility];  Service: Urology;  Laterality: Left;   EXPLORATORY LAPAROTOMY W/ BOWEL RESECTION  01-15-2004  @[facility]    small bowel resection for stricture/ benzoar   EXTRACORPOREAL SHOCK WAVE LITHOTRIPSY Right 03/27/2018   Procedure: EXTRACORPOREAL SHOCK WAVE LITHOTRIPSY ([facility]);  Surgeon: Ardis Hughs, MD;  Location: [facility];  Service: Urology;  Laterality: Right;   EXTRACORPOREAL SHOCK WAVE LITHOTRIPSY  x2 2008;  2009;  2015   HOLMIUM LASER APPLICATION Left [id number]   Procedure: HOLMIUM LASER APPLICATION;  Surgeon: Ceasar Mons, MD;  Location: [facility];  Service: Urology;  Laterality: Left;   IR FLUORO GUIDED NEEDLE PLC ASPIRATION/INJECTION LOC  02/22/2021   ORIF ANKLE FRACTURE Right 09/ 2008 @[facility]    per pt has retained hardware   TEE WITHOUT CARDIOVERSION N/A 01/25/2021   Procedure: TRANSESOPHAGEAL ECHOCARDIOGRAM (TEE);  Surgeon: Buford Dresser, MD;  Location: [facility];  Service: Cardiovascular;  Laterality: N/A;   TONSILLECTOMY  age 16   Patient Active Problem List   Diagnosis Date Noted   Chronic pain of right knee 07/16/2022   Groin pain, left 04/23/2022   Spinal stenosis of lumbar region 04/23/2022   Back pain of lumbar region with sciatica 04/02/2022   Arthralgia 04/02/2022   Fatigue  due to excessive exertion 01/31/2022   Weight gain 01/31/2022   Penicillin allergy    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 Choudhury is a 72 y/o female presenting to PM&R clinic as a referral from her PCP Dr. Reed Breech for pain s/p osteomyelitis . She states when they made the referral, she was in pain but that is no longer the case. She has persistent fatigue, which is the most bothersome thing for her. She also has occassional muscle spasms, but these self resolved.    Fatigue: Has been ongoing and debilitating over the past year. She has not gone to PT, had any medications, or other treatments specifically for fatigue. Does endorse family Hx hypothyroidism, but no personal Hx. Has had some mild weight gain with inactivity. She also endorses poor tolerance of hot weather, but denies any fatigue or pain associated with it. She denies any associated weakness, sensory changes, pain, diplopia or pruritis.    Pain: Her osteomyelitis was at L2-3, and she also has a bulging disc at L2. She was taking oxycodone at the time 2-3x daily in acute phase, but has been off of it since January 2023. She has been getting massages every few weeks to stretch her Psoas, which is helping with her posture.    Function/social: Lives in a 3 story town home by [employer]. Used to work part time at [employer], currently unemployed. She used to do woodworking and photography, but does not anymore because heat tends to make her fatigue worse. She used to exercise in her complex pool, but has also stopped doing that due to heat. She is independent of mobility and ADLs.   PRECAUTIONS: None   SUBJECTIVE:                                                                                                                                                                                       SUBJECTIVE STATEMENT: Pt presents today with increased pain, attributes to weather. Has continued HEP compliance    PAIN:  Are you having pain? Yes: NPRS scale: 5/10 Pain location: L knee/hip Pain description: ache Aggravating factors: activity  Relieving factors: rest   OBJECTIVE: (objective measures completed at initial evaluation unless otherwise dated)   DIAGNOSTIC FINDINGS: IMPRESSION: 1. Sequelae of prior discitis/osteomyelitis at L2-3 with near complete loss of vertebral body height in the anterior 1/3 of the L2 vertebral body and approximately 30% vertebral body height loss posteriorly. There is approximately 50% vertebral body height loss in the posterior 2/3 of the L3 vertebral body. No findings suspicious for new focus of discitis/osteomyelitis. 2. Advanced degenerative changes of the lumbar spine with progression of the degree of spinal canal stenosis, now moderate to severe at L2-3 and severe at L3-4. 3. Severe bilateral neural foraminal narrowing at L2-3. 4. Moderate bilateral neural foraminal narrowing at L1-2 and L3-4. 5. Moderate narrowing of the bilateral subarticular zones and mild-to-moderate right and mild left neural foraminal narrowing at L4-5.   PATIENT SURVEYS:  LEFS 39/80   MUSCLE LENGTH: Thomas test: See ROM   POSTURE:  Marked flexion in B hips   PALPATION: deferred   LOWER EXTREMITY ROM:   Active ROM Right eval Left eval  Hip flexion   120d  Hip extension   -20d(S/L)  Hip abduction   30d  Hip adduction      Hip internal rotation      Hip external rotation      Knee flexion   130d  Knee extension   -2d  Ankle dorsiflexion   10d  Ankle plantarflexion      Ankle inversion      Ankle eversion       (Blank rows = not tested)   LOWER EXTREMITY MMT:   MMT Right eval Left eval  Hip flexion   4  Hip extension   4  Hip abduction   4  Hip adduction      Hip internal rotation      Hip external rotation       Knee flexion   4  Knee extension   4  Ankle dorsiflexion      Ankle plantarflexion   4  Ankle inversion      Ankle eversion       (Blank rows = not tested)   LOWER EXTREMITY SPECIAL TESTS:  Hip special tests: Saralyn Pilar (FABER) test: negative, Thomas test: positive , Ober's test: negative, Ely's test: negative, Hip scouring test: negative, and Anterior hip impingement test: negative   FUNCTIONAL TESTS:  5 times sit to stand: 11s arms crossed from high table   GAIT: Distance walked: 75 ftx2  Assistive device utilized: Single point cane Level of assistance: Modified independence Comments: antalgic     TODAY'S TREATMENT:      [facility] Adult PT Treatment:                                                DATE: 08/23/22 Therapeutic Exercise: Nustep L4 x 8 min while taking subjective Supine quad set x 10 - 5" hold SAQ 2x15 2# Supine clamshell 2x20 GTB Supine hip fallout x 15 each GTB L hip flexor stretch 2x60" Bridge x 10 LAQ 2x10 2# each  [facility] Adult PT Treatment:  DATE: 08/21/22 Therapeutic Exercise: Nustep L4 8 min while taking subjective L hip flexor stretch 2x60" BIL SLR 2x15 2# B SAQ' s 2x15 2# Bridge 2x10 Supine hip fallouts GTB 20x B, 15/15 unilaterally LAQ 2x15 2# each  [facility] Adult PT Treatment:                                                DATE: 08/17/22 Therapeutic Exercise: Nustep L4 8 min L hip flexor stretch 30s x2 BIL SLR 15x 2# B SAQ' s 15x 2# alternating Bridge 15x Supine march 15/15 2# Supine hip fallouts GTB 25x B, 15/15 unilaterally FAQs with adduction 15x PF against wall, hips forward 15x Marching against wall hips forward 15/15   [facility] Adult PT Treatment:                                                DATE: 08/15/22 Therapeutic Exercise: Nustep L4 8 min L hip flexor stretch 30s x2 BIL SLR 15x B SAQ' s 15x Bridge 15x Supine march 15/15 (L knee pain) S/L clam L 15x FAQs with adduction 15x PF against wall,  hips forward 15x Marching against wall hips forward 15/15   PATIENT EDUCATION:  Education details: Discussed eval findings, rehab rationale and POC and patient is in agreement  Person educated: Patient Education method: Explanation and Handouts Education comprehension: verbalized understanding   HOME EXERCISE PROGRAM: Access Code: [email or web address] URL: [email or web address] Date: 08/09/2022 Prepared by: Sharlynn Oliphant  Exercises - Clamshell  - 2 x daily - 5 x weekly - 2 sets - 15 reps - Hip Flexor Stretch at Edge of Bed  - 2 x daily - 5 x weekly - 2 sets - 15 reps - 30s hold - Small Range Straight Leg Raise  - 2 x daily - 5 x weekly - 2 sets - 15 reps - Hooklying Single Leg Bent Knee Fallouts with Resistance  - 2 x daily - 5 x weekly - 2 sets - 15 reps   ASSESSMENT:   CLINICAL IMPRESSION: Pt was able to complete all prescribed exercises with no adverse but was limited more today secondary to increased pain. Therapy today focused on general LE strengthening in order to decrease pain and improve mobility. She continues to benefit from skilled PT, will continue per POC.   OBJECTIVE IMPAIRMENTS: Abnormal gait, decreased activity tolerance, decreased endurance, decreased mobility, difficulty walking, decreased ROM, decreased strength, impaired flexibility, improper body mechanics, postural dysfunction, and pain.    ACTIVITY LIMITATIONS: carrying, lifting, standing, sleeping, and bed mobility   PERSONAL FACTORS: Age, Fitness, Past/current experiences, Time since onset of injury/illness/exacerbation, and 1 comorbidity: osteomyelitis  are also affecting patient's functional outcome.    REHAB POTENTIAL: Fair based on previous therapy episodes to address issues   CLINICAL DECISION MAKING: Evolving/moderate complexity   EVALUATION COMPLEXITY: Low     GOALS: Goals reviewed with patient? Yes   SHORT TERM GOALS: Target date: 08/17/2022   Patient to demonstrate independence in HEP   Baseline: [email or web address] Goal status: MET Pr reports adherence 08/15/22     LONG TERM GOALS: Target date: 08/31/2022     LEFS score 49/80 Baseline: 39/80 Goal status: INITIAL   2.  Increase L hip extension to -10d Baseline: -20d  Goal status: INITIAL   3.  Increase L hip strength to 4+/5 Baseline:  MMT Right eval Left eval  Hip flexion   4  Hip extension   4  Hip abduction   4    Goal status: INITIAL   4.  297ft ambulation w/o need of cane Baseline: 12ft with minimal use of cane Goal status: INITIAL         PLAN:   PT FREQUENCY: 2x/week   PT DURATION: 4 weeks   PLANNED INTERVENTIONS: Therapeutic exercises, Therapeutic activity, Neuromuscular re-education, Balance training, Gait training, Patient/Family education, Self Care, Joint mobilization, Stair training, Manual therapy, and Re-evaluation   PLAN FOR NEXT SESSION: HEP review and update,   L hip strengthening and stretching, gait training, functional training, aerobic work   Ward Chatters, PT 08/23/2022, 1:34 PM

## 2022-08-23 ENCOUNTER — Ambulatory Visit: Payer: Medicare Other

## 2022-08-23 DIAGNOSIS — M6281 Muscle weakness (generalized): Secondary | ICD-10-CM

## 2022-08-23 DIAGNOSIS — M25552 Pain in left hip: Secondary | ICD-10-CM

## 2022-08-23 DIAGNOSIS — R2689 Other abnormalities of gait and mobility: Secondary | ICD-10-CM

## 2022-08-28 ENCOUNTER — Ambulatory Visit: Payer: Medicare Other

## 2022-08-28 DIAGNOSIS — M6281 Muscle weakness (generalized): Secondary | ICD-10-CM

## 2022-08-28 DIAGNOSIS — M25552 Pain in left hip: Secondary | ICD-10-CM

## 2022-08-28 DIAGNOSIS — R2689 Other abnormalities of gait and mobility: Secondary | ICD-10-CM

## 2022-08-28 NOTE — Therapy (Signed)
OUTPATIENT PHYSICAL THERAPY TREATMENT NOTE   Patient Name: Haley Singh MRN: [id number] DOB:[birth year], 72 y.o., female Today's Date: 08/28/2022  PCP: Haley Mires, MD   REFERRING PROVIDER:Norris, Richardson Landry, MD    END OF SESSION:   PT End of Session - 08/28/22 1200     Visit Number 7    Number of Visits 8    Date for PT Re-Evaluation 09/28/22    Authorization Type [employer]    PT Start Time 1205    PT Stop Time [id number]    PT Time Calculation (min) 40 min    Activity Tolerance Patient tolerated treatment well    Behavior During Therapy WFL for tasks assessed/performed             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   Hyperlipidemia    Hypertension    followed by pcp  (11-10-2019 pt stated never had a stress test)   Insomnia    MDD (major depressive disorder)    Mild persistent asthma    followed by pcp and dr Bernita Raisin (pulmonologist)   RLS (restless legs syndrome)    Vertebral osteomyelitis ([place]) 02/21/2021   Past Surgical History:  Procedure Laterality Date   BUBBLE STUDY  01/25/2021   Procedure: BUBBLE STUDY;  Surgeon: Buford Dresser, MD;  Location: [place];  Service: Cardiovascular;;   CATARACT EXTRACTION W/ INTRAOCULAR LENS  IMPLANT, BILATERAL  2009; 2010   CYSTOSCOPY/URETEROSCOPY/HOLMIUM LASER/STENT PLACEMENT Right 08/26/2019   Procedure: CYSTOSCOPY/RETROGRADE/URETEROSCOPY/HOLMIUM LASER/STENT PLACEMENT;  Surgeon: Ceasar Mons, MD;  Location: [facility];  Service: Urology;  Laterality: Right;   CYSTOSCOPY/URETEROSCOPY/HOLMIUM LASER/STENT PLACEMENT Left 11/17/2019   Procedure: CYSTOSCOPY/URETEROSCOPY, RETROGRADE,LASER LITHOTRIPSY, STENT PLACEMENT;  Surgeon: Ceasar Mons, MD;  Location: [facility];  Service: Urology;  Laterality: Left;   CYSTOSCOPY/URETEROSCOPY/HOLMIUM LASER/STENT PLACEMENT Left 02/19/2021   Procedure: CYSTOSCOPY/URETEROSCOPY/HOLMIUM LASER/STENT PLACEMENT;  Surgeon: Lucas Mallow, MD;  Location: [facility];  Service: Urology;  Laterality: Left;   EXPLORATORY LAPAROTOMY W/ BOWEL RESECTION  01-15-2004  @[facility]    small bowel resection for stricture/ benzoar   EXTRACORPOREAL SHOCK WAVE LITHOTRIPSY Right 03/27/2018   Procedure: EXTRACORPOREAL SHOCK WAVE LITHOTRIPSY (ESWL);  Surgeon: Ardis Hughs, MD;  Location: [facility];  Service: Urology;  Laterality: Right;   EXTRACORPOREAL SHOCK WAVE LITHOTRIPSY  x2 2008;  2009;  2015   HOLMIUM LASER APPLICATION Left [id number]   Procedure: HOLMIUM LASER APPLICATION;  Surgeon: Ceasar Mons, MD;  Location: [facility];  Service: Urology;  Laterality: Left;   IR FLUORO GUIDED NEEDLE PLC ASPIRATION/INJECTION LOC  02/22/2021   ORIF ANKLE FRACTURE Right 09/ 2008 @[facility]    per pt has retained hardware   TEE WITHOUT CARDIOVERSION N/A 01/25/2021   Procedure: TRANSESOPHAGEAL ECHOCARDIOGRAM (TEE);  Surgeon: Buford Dresser, MD;  Location: [facility];  Service: Cardiovascular;  Laterality: N/A;   TONSILLECTOMY  age 89   Patient Active Problem List   Diagnosis Date Noted   Chronic pain of right knee 07/16/2022   Groin pain, left 04/23/2022   Spinal stenosis of lumbar region 04/23/2022   Back pain of lumbar region with sciatica 04/02/2022   Arthralgia 04/02/2022   Fatigue due to excessive  exertion 01/31/2022   Weight gain 01/31/2022   Penicillin allergy    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 is a 72 y/o female presenting to PM&R clinic as a referral from her PCP Dr. Reed Singh for pain s/p osteomyelitis . She states when they made the referral, she was in pain but that is no longer the case. She has persistent fatigue, which is the most bothersome thing for her. She also has occassional muscle spasms, but these self resolved.    Fatigue: Has been ongoing and debilitating over the past year. She has not gone to PT, had any medications, or other treatments specifically for fatigue. Does endorse family Hx hypothyroidism, but no personal Hx. Has had some mild weight gain with inactivity. She also endorses poor tolerance of hot weather, but denies any fatigue or pain associated with it. She denies any associated weakness, sensory changes, pain, diplopia or pruritis.    Pain: Her osteomyelitis was at L2-3, and she also has a bulging disc at L2. She was taking oxycodone at the time 2-3x daily in acute phase, but has been off of it since January 2023. She has been getting massages every few weeks to stretch her Psoas, which is helping with her posture.    Function/social: Lives in a 3 story town home by [employer]. Used to work part time at [employer], currently unemployed. She used to do woodworking and photography, but does not anymore because heat tends to make her fatigue worse. She used to exercise in her complex pool, but has also stopped doing that due to heat. She is independent of mobility and ADLs.   PRECAUTIONS: None   SUBJECTIVE:                                                                                                                                                                                       SUBJECTIVE STATEMENT: Pt presents today with increased pain, attributes to weather, states her L knee hurts more than usual today. She wants to go back to her MD.    PAIN:  Are you having pain? Yes: NPRS scale: 3/10  Pain location: L knee/hip Pain description: ache Aggravating factors: activity Relieving factors: rest   OBJECTIVE: (objective measures completed at initial evaluation unless otherwise dated)   DIAGNOSTIC FINDINGS: IMPRESSION: 1. Sequelae of prior discitis/osteomyelitis at L2-3 with near complete loss of vertebral body height in the anterior 1/3 of the L2 vertebral body and approximately 30% vertebral body height loss posteriorly. There is approximately 50% vertebral body height loss in the posterior 2/3 of the L3 vertebral body. No findings suspicious for new focus of discitis/osteomyelitis. 2. Advanced degenerative changes of the lumbar spine with progression of the degree of spinal canal stenosis, now moderate to severe at L2-3 and severe at L3-4. 3. Severe bilateral neural foraminal narrowing at L2-3. 4. Moderate bilateral neural foraminal narrowing at L1-2 and L3-4. 5. Moderate narrowing of the bilateral subarticular zones and mild-to-moderate right and mild left neural foraminal narrowing at L4-5.   PATIENT SURVEYS:  LEFS 39/80   MUSCLE LENGTH: Thomas test: See ROM   POSTURE:  Marked flexion in B hips   PALPATION: deferred   LOWER EXTREMITY ROM:   Active ROM Right eval Left eval  Hip flexion   120d  Hip extension   -20d(S/L)  Hip abduction   30d  Hip adduction      Hip internal rotation      Hip external rotation      Knee flexion   130d  Knee extension   -2d  Ankle dorsiflexion   10d  Ankle plantarflexion      Ankle inversion      Ankle eversion       (Blank rows = not tested)   LOWER EXTREMITY MMT:   MMT Right eval Left eval  Hip flexion   4  Hip extension   4  Hip abduction   4  Hip adduction      Hip internal  rotation      Hip external rotation      Knee flexion   4  Knee extension   4  Ankle dorsiflexion      Ankle plantarflexion   4  Ankle inversion      Ankle eversion       (Blank rows = not tested)   LOWER EXTREMITY SPECIAL TESTS:  Hip special tests: Saralyn Pilar (FABER) test: negative, Thomas test: positive , Ober's test: negative, Ely's test: negative, Hip scouring test: negative, and Anterior hip impingement test: negative   FUNCTIONAL TESTS:  5 times sit to stand: 11s arms crossed from high table   GAIT: Distance walked: 75 ftx2  Assistive device utilized: Single point cane Level of assistance: Modified independence Comments: antalgic     TODAY'S TREATMENT:      [facility] Adult PT Treatment:                                                DATE: 08/28/22 Therapeutic Exercise: Nustep L4 x 8 min while taking subjective Standing hip abduction 2x10 - cues for upright posture SAQ 2x15 2# Supine hip fallout 2 x 15 each GTB Hip flexor stretch 2x60" BIL Bridge 2 x 10 LAQ 2x10 2# each   [facility] Adult PT Treatment:  DATE: 08/23/22 Therapeutic Exercise: Nustep L4 x 8 min while taking subjective Supine quad set x 10 - 5" hold SAQ 2x15 2# Supine clamshell 2x20 GTB Supine hip fallout x 15 each GTB L hip flexor stretch 2x60" Bridge x 10 LAQ 2x10 2# each  [facility] Adult PT Treatment:                                                DATE: 08/21/22 Therapeutic Exercise: Nustep L4 8 min while taking subjective L hip flexor stretch 2x60" BIL SLR 2x15 2# B SAQ' s 2x15 2# Bridge 2x10 Supine hip fallouts GTB 20x B, 15/15 unilaterally LAQ 2x15 2# each    PATIENT EDUCATION:  Education details: Discussed eval findings, rehab rationale and POC and patient is in agreement  Person educated: Patient Education method: Explanation and Handouts Education comprehension: verbalized understanding   HOME EXERCISE PROGRAM: Access Code: [email or web address] URL:  [email or web address] Date: 08/09/2022 Prepared by: Sharlynn Oliphant  Exercises - Clamshell  - 2 x daily - 5 x weekly - 2 sets - 15 reps - Hip Flexor Stretch at Edge of Bed  - 2 x daily - 5 x weekly - 2 sets - 15 reps - 30s hold - Small Range Straight Leg Raise  - 2 x daily - 5 x weekly - 2 sets - 15 reps - Hooklying Single Leg Bent Knee Fallouts with Resistance  - 2 x daily - 5 x weekly - 2 sets - 15 reps   ASSESSMENT:   CLINICAL IMPRESSION:  Patient presents to PT reporting increased pain in her Lt hip and particularly her Lt knee today, stating she wants to see her MD again soon. Session today continued to focus on LE strengthening and hip stretching with incorporation of more standing exercises today to moderate effect, needing cues for upright posture and had some increase in Lt knee pain at end. Patient continues to benefit from skilled PT services and should be progressed as able to improve functional independence.    OBJECTIVE IMPAIRMENTS: Abnormal gait, decreased activity tolerance, decreased endurance, decreased mobility, difficulty walking, decreased ROM, decreased strength, impaired flexibility, improper body mechanics, postural dysfunction, and pain.    ACTIVITY LIMITATIONS: carrying, lifting, standing, sleeping, and bed mobility   PERSONAL FACTORS: Age, Fitness, Past/current experiences, Time since onset of injury/illness/exacerbation, and 1 comorbidity: osteomyelitis  are also affecting patient's functional outcome.    REHAB POTENTIAL: Fair based on previous therapy episodes to address issues   CLINICAL DECISION MAKING: Evolving/moderate complexity   EVALUATION COMPLEXITY: Low     GOALS: Goals reviewed with patient? Yes   SHORT TERM GOALS: Target date: 08/17/2022   Patient to demonstrate independence in HEP  Baseline: [email or web address] Goal status: MET Pr reports adherence 08/15/22     LONG TERM GOALS: Target date: 08/31/2022     LEFS score 49/80 Baseline:  39/80 Goal status: INITIAL   2.  Increase L hip extension to -10d Baseline: -20d Goal status: INITIAL   3.  Increase L hip strength to 4+/5 Baseline:  MMT Right eval Left eval  Hip flexion   4  Hip extension   4  Hip abduction   4    Goal status: INITIAL   4.  21ft ambulation w/o need of cane Baseline: 60ft with minimal use of cane Goal status: INITIAL        PLAN:  PT FREQUENCY: 2x/week   PT DURATION: 4 weeks   PLANNED INTERVENTIONS: Therapeutic exercises, Therapeutic activity, Neuromuscular re-education, Balance training, Gait training, Patient/Family education, Self Care, Joint mobilization, Stair training, Manual therapy, and Re-evaluation   PLAN FOR NEXT SESSION: HEP review and update,   L hip strengthening and stretching, gait training, functional training, aerobic work   [employer], PTA 08/28/2022, 12:45 PM

## 2022-09-03 ENCOUNTER — Ambulatory Visit: Payer: Medicare Other | Attending: Physical Medicine and Rehabilitation

## 2022-09-03 DIAGNOSIS — R2689 Other abnormalities of gait and mobility: Secondary | ICD-10-CM | POA: Insufficient documentation

## 2022-09-03 DIAGNOSIS — M25552 Pain in left hip: Secondary | ICD-10-CM | POA: Insufficient documentation

## 2022-09-03 DIAGNOSIS — M6281 Muscle weakness (generalized): Secondary | ICD-10-CM | POA: Diagnosis present

## 2022-09-03 NOTE — Therapy (Signed)
OUTPATIENT PHYSICAL THERAPY TREATMENT NOTE/DC SUMMARY   Patient Name: Haley Singh MRN: [id number] DOB:1951-04-23, 72 y.o., female Today's Date: 09/03/2022  PCP: Haley Mires, MD   REFERRING PROVIDER:Norris, Haley Landry, MD   PHYSICAL THERAPY DISCHARGE SUMMARY  Visits from Start of Care: 8  Current functional level related to goals / functional outcomes: Goals partially met   Remaining deficits: L knee pain/swelling   Education / Equipment: HEP   Patient agrees to discharge. Patient goals were partially met. Patient is being discharged due to maximized rehab potential.   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   Hyperlipidemia    Hypertension    followed by pcp  (11-10-2019 pt stated never had a stress test)   Insomnia    MDD (major depressive disorder)    Mild persistent asthma    followed by pcp and dr Haley Singh (pulmonologist)   RLS (restless legs syndrome)    Vertebral osteomyelitis 02/21/2021   Past Surgical History:  Procedure Laterality Date   BUBBLE STUDY  01/25/2021   Procedure: BUBBLE STUDY;  Surgeon: Haley Dresser, MD;  Location: [place];  Service: Cardiovascular;;   CATARACT EXTRACTION W/ INTRAOCULAR LENS  IMPLANT, BILATERAL  2009; 2010   CYSTOSCOPY/URETEROSCOPY/HOLMIUM LASER/STENT PLACEMENT Right 08/26/2019   Procedure:  CYSTOSCOPY/RETROGRADE/URETEROSCOPY/HOLMIUM LASER/STENT PLACEMENT;  Surgeon: Haley Mons, MD;  Location: [facility];  Service: Urology;  Laterality: Right;   CYSTOSCOPY/URETEROSCOPY/HOLMIUM LASER/STENT PLACEMENT Left 11/17/2019   Procedure: CYSTOSCOPY/URETEROSCOPY, RETROGRADE,LASER LITHOTRIPSY, STENT PLACEMENT;  Surgeon: Haley Mons, MD;  Location: [facility];  Service: Urology;  Laterality: Left;   CYSTOSCOPY/URETEROSCOPY/HOLMIUM LASER/STENT PLACEMENT Left 02/19/2021   Procedure: CYSTOSCOPY/URETEROSCOPY/HOLMIUM LASER/STENT PLACEMENT;  Surgeon: Haley Mallow, MD;  Location: [facility];  Service: Urology;  Laterality: Left;   EXPLORATORY LAPAROTOMY W/ BOWEL RESECTION  01-15-2004  @[facility]    small bowel resection for stricture/ benzoar   EXTRACORPOREAL SHOCK WAVE LITHOTRIPSY Right 03/27/2018   Procedure: EXTRACORPOREAL SHOCK WAVE LITHOTRIPSY (ES[facility]);  Surgeon: Haley Hughs, MD;  Location: [facility];  Service: Urology;  Laterality: Right;   EXTRACORPOREAL SHOCK WAVE LITHOTRIPSY  x2 2008;  2009;  2015   HOLMIUM LASER APPLICATION Left [id number]   Procedure: HOLMIUM LASER APPLICATION;  Surgeon: Haley Mons, MD;  Location: [facility];  Service: Urology;  Laterality: Left;   IR FLUORO GUIDED NEEDLE PLC ASPIRATION/INJECTION LOC  02/22/2021   ORIF ANKLE FRACTURE Right 09/ 2008 @[facility]    per pt has retained hardware   TEE WITHOUT CARDIOVERSION N/A 01/25/2021   Procedure: TRANSESOPHAGEAL ECHOCARDIOGRAM (TEE);  Surgeon: Haley Dresser, MD;  Location: [place];  Service: Cardiovascular;  Laterality: N/A;  TONSILLECTOMY  age 71   Patient Active Problem List   Diagnosis Date Noted   Chronic pain of right knee 07/16/2022   Groin pain, left 04/23/2022   Spinal stenosis of lumbar region 04/23/2022   Back pain of lumbar region with sciatica 04/02/2022   Arthralgia 04/02/2022   Fatigue due to excessive exertion  01/31/2022   Weight gain 01/31/2022   Penicillin allergy    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 is a 72 y/o female presenting to PM&R clinic as a referral from her PCP Haley Singh for pain s/p osteomyelitis . She states when they made the referral, she was in pain but that is no longer the case. She has persistent fatigue, which is the most bothersome thing for her. She also has occassional muscle spasms, but these self resolved.    Fatigue: Has been ongoing and debilitating over the past year. She has not gone to PT, had any medications, or other treatments specifically for fatigue. Does endorse family Hx hypothyroidism, but no personal Hx. Has had some mild weight gain with inactivity. She also endorses poor tolerance of hot weather, but denies any fatigue or pain associated with it. She denies any associated weakness, sensory changes, pain, diplopia or pruritis.    Pain: Her osteomyelitis was at L2-3, and she also has a bulging disc at L2. She was taking oxycodone at the time 2-3x daily in acute phase, but has been off of it since January 2023. She has been getting massages every few weeks to stretch her Psoas, which is helping with her posture.    Function/social: Lives in a 3 story town home by [employer]. Used to work part time at [employer], currently unemployed. She used to do woodworking and photography, but does not anymore because heat  tends to make her fatigue worse. She used to exercise in her complex pool, but has also stopped doing that due to heat. She is independent of mobility and ADLs.   PRECAUTIONS: None   SUBJECTIVE:  SUBJECTIVE STATEMENT: L hip symptoms minimal, only notes pain in awkward positions and when compensating for L knee issues.   PAIN:  Are you having pain? Yes: NPRS scale: 3/10 Pain location: L knee/hip Pain description: ache Aggravating factors: activity Relieving factors: rest   OBJECTIVE: (objective measures completed at initial evaluation unless otherwise dated)   DIAGNOSTIC FINDINGS: IMPRESSION: 1. Sequelae of prior discitis/osteomyelitis at L2-3 with near complete loss of vertebral body height in the anterior 1/3 of the L2 vertebral body and approximately 30% vertebral body height loss posteriorly. There is approximately 50% vertebral body height loss in the posterior 2/3 of the L3 vertebral body. No findings suspicious for new focus of discitis/osteomyelitis. 2. Advanced degenerative changes of the lumbar spine with progression of the degree of spinal canal stenosis, now moderate to severe at L2-3 and severe at L3-4. 3. Severe bilateral neural foraminal narrowing at L2-3. 4. Moderate bilateral neural foraminal narrowing at L1-2 and L3-4. 5. Moderate narrowing of the bilateral subarticular zones and mild-to-moderate right and mild left neural foraminal narrowing at L4-5.   PATIENT SURVEYS:  LEFS 39/80; 09/03/22 31/80   MUSCLE LENGTH: Marcello Moores test: See ROM   POSTURE:  Marked flexion in B hips   PALPATION: deferred   LOWER EXTREMITY ROM:   Active ROM Right eval Left eval L  09/03/22  Hip flexion   120d   Hip extension   -20d(S/L) -25 S/L  Hip abduction   30d   Hip adduction       Hip internal  rotation       Hip external rotation       Knee flexion   130d   Knee extension   -2d   Ankle dorsiflexion   10d   Ankle plantarflexion       Ankle inversion       Ankle eversion        (Blank rows = not tested)   LOWER EXTREMITY MMT:   MMT Right eval Left eval L 09/03/22  Hip flexion   4 4+  Hip extension   4 4  Hip abduction   4 4+  Hip adduction       Hip internal rotation       Hip external rotation       Knee flexion   4   Knee extension   4   Ankle dorsiflexion       Ankle plantarflexion   4   Ankle inversion       Ankle eversion        (Blank rows = not tested)   LOWER EXTREMITY SPECIAL TESTS:  Hip special tests: Saralyn Pilar (FABER) test: negative, Thomas test: positive , Ober's test: negative, Ely's test: negative, Hip scouring test: negative, and Anterior hip impingement test: negative   FUNCTIONAL TESTS:  5 times sit to stand: 11s arms crossed from high table   GAIT: Distance walked: 75 ftx2  Assistive device utilized: Single point cane Level of assistance: Modified independence Comments: antalgic     TODAY'S TREATMENT:      [facility] Adult PT Treatment:                                                DATE: 09/03/22 Therapeutic Exercise: Nustep L4 x 8 min while taking subjective Supine hip fallout 15x B 15/15 unilateral GTB Hip flexor stretch 2x30s L  Bridge 2 x 10 LAQ 2x10 2# each  [facility] Adult PT Treatment:                                                DATE: 08/28/22 Therapeutic Exercise: Nustep L4 x 8 min while taking subjective Standing hip abduction 2x10 - cues for upright posture SAQ 2x15 2# Supine hip fallout 2 x 15 each GTB Hip flexor stretch 2x60" BIL Bridge 2 x 10 LAQ 2x10 2# each   [facility] Adult PT Treatment:                                                DATE: 08/23/22 Therapeutic Exercise: Nustep L4 x 8 min while taking subjective Supine quad set x 10 - 5" hold SAQ 2x15 2# Supine clamshell 2x20 GTB Supine hip fallout x 15 each GTB L hip flexor  stretch 2x60" Bridge x 10 LAQ 2x10 2# each  [facility] Adult PT Treatment:                                                DATE: 08/21/22 Therapeutic Exercise: Nustep L4 8 min while taking subjective L hip flexor stretch 2x60" BIL SLR 2x15 2# B SAQ' s 2x15 2# Bridge 2x10 Supine hip fallouts GTB 20x B, 15/15 unilaterally LAQ 2x15 2# each    PATIENT EDUCATION:  Education details: Discussed eval findings, rehab rationale and POC and patient is in agreement  Person educated: Patient Education method: Explanation and Handouts Education comprehension: verbalized understanding   HOME EXERCISE PROGRAM: Access Code: [email or web address] URL: [email or web address] Date: 09/03/2022 Prepared by: Sharlynn Oliphant  Exercises - Clamshell  - 2 x daily - 5 x weekly - 2 sets - 15 reps - Hip Flexor Stretch at Edge of Bed  - 2 x daily - 5 x weekly - 2 sets - 15 reps - 30s hold - Small Range Straight Leg Raise  - 2 x daily - 5 x weekly - 2 sets - 15 reps - Hooklying Single Leg Bent Knee Fallouts with Resistance  - 2 x daily - 5 x weekly - 2 sets - 15 reps   ASSESSMENT:   CLINICAL IMPRESSION: L knee pain major limiting factor at this time. STGs met.  Long term goals not met as patient has reached a plateau in progress due in part to L knee symptoms.  LEFS score has regressed, gait goal not met due to L knee pathology,    OBJECTIVE IMPAIRMENTS: Abnormal gait, decreased activity tolerance, decreased endurance, decreased mobility, difficulty walking, decreased ROM, decreased strength, impaired flexibility, improper body mechanics, postural dysfunction, and pain.    ACTIVITY LIMITATIONS: carrying, lifting, standing, sleeping, and bed mobility   PERSONAL FACTORS: Age, Fitness, Past/current experiences, Time since onset of injury/illness/exacerbation, and 1 comorbidity: osteomyelitis  are also affecting patient's functional outcome.    REHAB POTENTIAL: Fair based on previous therapy episodes to address  issues   CLINICAL DECISION MAKING: Evolving/moderate complexity   EVALUATION COMPLEXITY: Low     GOALS: Goals reviewed with patient? Yes   SHORT TERM GOALS: Target date: 08/17/2022  Patient to demonstrate independence in HEP  Baseline: [email or web address] Goal status: MET Pr reports adherence 08/15/22     LONG TERM GOALS: Target date: 08/31/2022     LEFS score 49/80 Baseline: 39/80; 09/03/22 31/80 Goal status: Not met   2.  Increase L hip extension to -10d Baseline:  Active ROM Right eval Left eval L  09/03/22  Hip flexion   120d 120d  Hip extension   -20d(S/L) -25 S/L   Goal status: Not met   3.  Increase L hip strength to 4+/5 Baseline:  MMT Right eval Left eval L 09/03/22  Hip flexion   4 4+  Hip extension   4 4  Hip abduction   4 4+    Goal status: Partially met   4.  247ft ambulation w/o need of cane Baseline: 40ft with minimal use of cane; 09/03/22 Unable due to L knee pain Goal status: Not met        PLAN:   PT FREQUENCY: 2x/week   PT DURATION: 4 weeks   PLANNED INTERVENTIONS: Therapeutic exercises, Therapeutic activity, Neuromuscular re-education, Balance training, Gait training, Patient/Family education, Self Care, Joint mobilization, Stair training, Manual therapy, and Re-evaluation   PLAN FOR NEXT SESSION: DC to HEP   Lanice Shirts, PT 09/03/2022, 5:21 PM

## 2022-09-06 ENCOUNTER — Ambulatory Visit: Payer: Medicare Other

## 2022-09-11 ENCOUNTER — Ambulatory Visit: Payer: Medicare Other

## 2022-09-28 ENCOUNTER — Ambulatory Visit: Payer: Medicare Other | Admitting: Pulmonary Disease

## 2022-09-28 ENCOUNTER — Encounter: Payer: Self-pay | Admitting: Pulmonary Disease

## 2022-09-28 VITALS — BP 138/82 | HR 82 | Temp 97.7°F | Ht 66.0 in | Wt 219.2 lb

## 2022-09-28 DIAGNOSIS — J4530 Mild persistent asthma, uncomplicated: Secondary | ICD-10-CM

## 2022-09-28 NOTE — Progress Notes (Signed)
Haley Singh    [id number]    [birth year]  Primary Care Physician:Briscoe, Sharrie Rothman, MD  Referring Physician: Macy Mis, MD [address] [place],  [place] [address]  Chief complaint:   Follow up for Mild Persistent Asthma.  HPI: Haley Singh is 72 y.o. with past medical history of asthma, hypertension, hyperlipidemia, depression. She has been diagnosed with asthma about 15 years ago. She was initially on Advair which was then switched to Qvar.  She was then changed to [facility] in 2018 as symptoms are not well controlled. She saw an allergist asked about 15 years ago and was told that she had sensitivity to dust, dogs, cats, weeds.  Did not tolerate coming off of Breo in 2020. 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 She does not have any exposures at work or at home. She does not have any mold issues of pets. She is a nonsmoker. Developed osteomyelitis of her lumbar spine with MSSA bacteremia with hospitalizations in 2022.  She went on a prolonged antibiotic therapy as an outpatient with clearance of infection.   Interim History: Overall breathing is stable.  Continues on Breo inhaler.  Hardly needs to use her rescue inhaler  Outpatient Encounter Medications as of 09/28/2022  Medication Sig   amLODipine (NORVASC) 5 MG tablet Take 5 mg by mouth daily.   atorvastatin (LIPITOR) 40 MG tablet Take 40 mg by mouth daily.    BREO ELLIPTA 100-25 MCG/ACT AEPB INHALE ONE PUFF BY MOUTH DAILY   celecoxib (CELEBREX) 200 MG capsule Take by mouth.   cetirizine (ZYRTEC) 10 MG tablet Take 10 mg by mouth every morning.   famotidine (PEPCID) 40 MG tablet Take 40 mg by mouth daily.   fluticasone (FLONASE) 50 MCG/ACT nasal spray Place 1 spray into both nostrils 2 (two) times daily.    gabapentin (NEURONTIN) 300 MG capsule Take 600 mg by mouth at bedtime.   hydrALAZINE (APRESOLINE) 25 MG tablet Take 1  tablet (25 mg total) by mouth every 8 (eight) hours.   losartan (COZAAR) 100 MG tablet Take 1 tablet (100 mg total) by mouth daily.   montelukast (SINGULAIR) 10 MG tablet Take 10 mg by mouth daily.    oxyCODONE (OXY IR/ROXICODONE) 5 MG immediate release tablet Take 5 mg by mouth every 4 (four) hours as needed for severe pain.   traZODone (DESYREL) 150 MG tablet Take 150 mg by mouth at bedtime.    venlafaxine XR (EFFEXOR-XR) 150 MG 24 hr capsule Take 150 mg by mouth every morning.    [DISCONTINUED] tiZANidine (ZANAFLEX) 4 MG tablet Take 1 tablet (4 mg total) by mouth every 8 (eight) hours as needed for muscle spasms. Do not take concurrently with Robaxin   No facility-administered encounter medications on file as of 09/28/2022.   Physical Exam: Blood pressure 138/82, pulse 82, temperature 97.7 F (36.5 C), temperature source Oral, height 5\' 6"  (1.676 m), weight 219 lb 3.2 oz (99.4 kg), SpO2 98 %. Gen:      No acute distress HEENT:  EOMI, sclera anicteric Neck:     No masses; no thyromegaly Lungs:    Clear to auscultation bilaterally; normal respiratory effort CV:         Regular rate and rhythm; no murmurs Abd:      + bowel sounds; soft, non-tender; no palpable masses, no distension Ext:  No edema; adequate peripheral perfusion Skin:      Warm and dry; no rash Neuro: alert and oriented x 3 Psych: normal mood and affect   Data Reviewed: Sleep Study 05/19/13- This study did not show evidence for sleep disordered breathing.  Chest x-ray 12/16/2020-patchy interstitial prominence with peribronchial cuffing I reviewed the images personally.  PFTs: Spirometry 01/16/16 FVC 2.56 (74%), FEV1 1.88 [71%), F/F 73.3 No obstruction, restriction possible.                                                         PFTs 07/06/16 FVC 2.81 (84%), FEV1 2.37 (93%), F/F 84, TLC 113%, DLCO 64% Isolated reduction in diffusion capacity.  FENO 05/17/16- 31 FENO 2/2/118- 23   FENO 05/10/17- 18 FENO  11/28/17-22  ACT score 09/05/2021- 23  CBC 02/22/2021-WBC 9.9, eos 0% Blood allergy profile 05/17/16- Postive for ragweed allergy. IgE 36  Assessment:  Chronic cough, secondary to ACE inhibitor.   Off Benzapril and on losartan improvement in symptoms.  Mild persistent asthma Continue Breo, albuterol as needed Continue Singulair  Allergies, postnasal drip Zyrtec, Flonase over-the-counter  Plan/Recommendations: - Breo, albuterol as needed - Zyrtec, Singulair, Flonase.  Chilton Greathouse MD [facility] 09/28/2022, 1:10 PM  CC: Macy Mis, MD

## 2022-09-28 NOTE — Patient Instructions (Signed)
I am glad you are doing well with regard to your breathing Continue the Breo Follow-up in 1 year

## 2022-11-01 ENCOUNTER — Other Ambulatory Visit: Payer: Self-pay | Admitting: Urology

## 2022-11-06 NOTE — Progress Notes (Signed)
Pre op phone call completed. NPO after midnight. Pt aware to hold Celebrex. Arrive at [facility].

## 2022-11-09 ENCOUNTER — Encounter (HOSPITAL_BASED_OUTPATIENT_CLINIC_OR_DEPARTMENT_OTHER)
Admission: RE | Disposition: A | Discharge status: Home / Self Care | Payer: Self-pay | Source: Ambulatory Visit | Attending: Urology

## 2022-11-09 ENCOUNTER — Encounter (HOSPITAL_BASED_OUTPATIENT_CLINIC_OR_DEPARTMENT_OTHER): Payer: Self-pay | Admitting: Urology

## 2022-11-09 ENCOUNTER — Ambulatory Visit (HOSPITAL_BASED_OUTPATIENT_CLINIC_OR_DEPARTMENT_OTHER)
Admission: RE | Admit: 2022-11-09 | Discharge: 2022-11-09 | Disposition: A | Discharge status: Home / Self Care | Payer: Medicare Other | Source: Ambulatory Visit | Attending: Urology | Admitting: Urology

## 2022-11-09 ENCOUNTER — Ambulatory Visit (HOSPITAL_COMMUNITY): Payer: Medicare Other

## 2022-11-09 DIAGNOSIS — N200 Calculus of kidney: Secondary | ICD-10-CM | POA: Diagnosis present

## 2022-11-09 HISTORY — PX: EXTRACORPOREAL SHOCK WAVE LITHOTRIPSY: SHX1557

## 2022-11-09 SURGERY — LITHOTRIPSY, ESWL
Anesthesia: LOCAL | Laterality: Left

## 2022-11-09 MED ORDER — DIAZEPAM 5 MG PO TABS
ORAL_TABLET | ORAL | Status: AC
  Filled 2022-11-09: qty 2

## 2022-11-09 MED ORDER — OXYCODONE HCL 5 MG PO TABS: 5.0000 mg | ORAL_TABLET | ORAL | 0 refills | Status: DC | PRN

## 2022-11-09 MED ORDER — CIPROFLOXACIN HCL 500 MG PO TABS
500.0000 mg | ORAL_TABLET | ORAL | Status: AC
  Administered 2022-11-09: 500 mg via ORAL

## 2022-11-09 MED ORDER — DIAZEPAM 5 MG PO TABS
10.0000 mg | ORAL_TABLET | ORAL | Status: AC
  Administered 2022-11-09: 10 mg via ORAL

## 2022-11-09 MED ORDER — DIPHENHYDRAMINE HCL 25 MG PO CAPS
25.0000 mg | ORAL_CAPSULE | ORAL | Status: AC
  Administered 2022-11-09: 25 mg via ORAL

## 2022-11-09 MED ORDER — DIPHENHYDRAMINE HCL 25 MG PO CAPS
ORAL_CAPSULE | ORAL | Status: AC
  Filled 2022-11-09: qty 1

## 2022-11-09 MED ORDER — SODIUM CHLORIDE 0.9 % IV SOLN: INTRAVENOUS | Status: DC

## 2022-11-09 MED ORDER — CIPROFLOXACIN HCL 500 MG PO TABS
ORAL_TABLET | ORAL | Status: AC
  Filled 2022-11-09: qty 1

## 2022-11-09 NOTE — Op Note (Signed)
[facility] Operative Note  Treating Physician: Rhoderick Moody, MD  Pre-op diagnosis: 8 mm left renal stone  Post-op diagnosis: Two 4 mm left renal stones   Procedure: LEFT ESWL  See [facility] OP note scanned into chart. Also because of the size, density, location and other factors that cannot be anticipated I feel this will likely be a staged procedure. This fact supersedes any indication in the scanned [place] [facility] operative note to the contrary

## 2022-11-09 NOTE — H&P (Signed)
See scanned [facility] documents for H&P.   

## 2022-11-09 NOTE — Progress Notes (Signed)
Very sleepy upon return from [facility] Procedure, gait very unsteady, assistance required to restroom with RN at side and use of cane, opens eyes spont, speech WNL, oriented x 4, to recliner post voiding. Denies any pain or discomfort at this time.

## 2022-11-09 NOTE — Discharge Instructions (Signed)

## 2022-11-12 ENCOUNTER — Encounter (HOSPITAL_BASED_OUTPATIENT_CLINIC_OR_DEPARTMENT_OTHER): Payer: Self-pay | Admitting: Urology

## 2022-12-04 ENCOUNTER — Other Ambulatory Visit: Payer: Self-pay | Admitting: Pulmonary Disease

## 2023-02-20 NOTE — Therapy (Signed)
OUTPATIENT PHYSICAL THERAPY EVALUATION   Patient Name: Haley Singh MRN: [id number] DOB:[birth year], 72 y.o., female Today's Date: 02/21/2023   END OF SESSION:  PT End of Session - 02/21/23 1041     Visit Number 1    Number of Visits 13    Date for PT Re-Evaluation 04/04/23    Authorization Type [employer] MDC    PT Start Time 1015    PT Stop Time 1100    PT Time Calculation (min) 45 min    Activity Tolerance Patient tolerated treatment well    Behavior During Therapy WFL for tasks assessed/performed             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   Hyperlipidemia    Hypertension    followed by pcp  (11-10-2019 pt stated never had a stress test)   Insomnia    MDD (major depressive disorder)    Mild persistent asthma    followed by pcp and dr Belva Crome (pulmonologist)   RLS (restless legs syndrome)    Vertebral osteomyelitis (HCC) 02/21/2021   Past Surgical History:  Procedure Laterality Date   BUBBLE STUDY  01/25/2021   Procedure: BUBBLE STUDY;  Surgeon: Jodelle Red, MD;  Location: [facility] ENDOSCOPY;  Service: Cardiovascular;;   CATARACT EXTRACTION W/ INTRAOCULAR LENS  IMPLANT, BILATERAL  2009; 2010   CYSTOSCOPY/URETEROSCOPY/HOLMIUM LASER/STENT PLACEMENT Right 08/26/2019   Procedure: CYSTOSCOPY/RETROGRADE/URETEROSCOPY/HOLMIUM LASER/STENT PLACEMENT;  Surgeon: Rene Paci, MD;  Location[facility];  Service: Urology;  Laterality: Right;   CYSTOSCOPY/URETEROSCOPY/HOLMIUM LASER/STENT PLACEMENT Left 11/17/2019   Procedure: CYSTOSCOPY/URETEROSCOPY, RETROGRADE,LASER LITHOTRIPSY, STENT PLACEMENT;  Surgeon: Rene Paci, MD;  Location: [facility];  Service:  Urology;  Laterality: Left;   CYSTOSCOPY/URETEROSCOPY/HOLMIUM LASER/STENT PLACEMENT Left 02/19/2021   Procedure: CYSTOSCOPY/URETEROSCOPY/HOLMIUM LASER/STENT PLACEMENT;  Surgeon: Crista Elliot, MD;  Location: [facility];  Service: Urology;  Laterality: Left;   EXPLORATORY LAPAROTOMY W/ BOWEL RESECTION  01-15-2004  @[facility]    small bowel resection for stricture/ benzoar   EXTRACORPOREAL SHOCK WAVE LITHOTRIPSY Right 03/27/2018   Procedure: EXTRACORPOREAL SHOCK WAVE LITHOTRIPSY (ESWL);  Surgeon: Crist Fat, MD;  Location: [facility];  Service: Urology;  Laterality: Right;   EXTRACORPOREAL SHOCK WAVE LITHOTRIPSY  x2 2008;  2009;  2015   EXTRACORPOREAL SHOCK WAVE LITHOTRIPSY Left 11/09/2022   Procedure: LEFT EXTRACORPOREAL SHOCK WAVE LITHOTRIPSY (ESWL);  Surgeon: Rene Paci, MD;  Location: [facility];  Service: Urology;  Laterality: Left;  75 MINUTES   HOLMIUM LASER APPLICATION Left 11/17/2019   Procedure: HOLMIUM LASER APPLICATION;  Surgeon: Rene Paci, MD;  Location: [facility];  Service: Urology;  Laterality: Left;   IR FLUORO GUIDED NEEDLE PLC ASPIRATION/INJECTION LOC  02/22/2021   ORIF ANKLE FRACTURE Right 09/ 2008 @[facility]    per pt has retained hardware   TEE WITHOUT CARDIOVERSION N/A 01/25/2021   Procedure: TRANSESOPHAGEAL ECHOCARDIOGRAM (TEE);  Surgeon: Jodelle Red, MD;  Location: [facility] ENDOSCOPY;  Service: Cardiovascular;  Laterality: N/A;   TONSILLECTOMY  age 77   Patient Active Problem List   Diagnosis Date Noted   Chronic pain of right knee 07/16/2022   Groin pain, left 04/23/2022   Spinal stenosis  of lumbar region 04/23/2022   Back pain of lumbar region with sciatica 04/02/2022   Arthralgia 04/02/2022   Fatigue due to excessive exertion 01/31/2022   Weight gain 01/31/2022   Penicillin allergy    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   Vertebral osteomyelitis (HCC) 02/21/2021   Intractable nausea and vomiting 02/18/2021    Hypokalemia 02/18/2021   MSSA bacteremia    Discitis of lumbar region    Osteomyelitis (HCC) 01/17/2021   Mild persistent asthma without complication 05/17/2016   OSA (obstructive sleep apnea) 03/03/2014    PCP: Macy Mis, MD  REFERRING PROVIDER: Colvin Caroli, PA-C  REFERRING DIAG: Pain in left knee  THERAPY DIAG:  Chronic pain of left knee  Pain in left hip  Muscle weakness (generalized)  Other abnormalities of gait and mobility  Localized edema  Rationale for Evaluation and Treatment: Rehabilitation  ONSET DATE: November 2023, s/p left knee arthroscopy in June or July   SUBJECTIVE:  SUBJECTIVE STATEMENT: Patient reports chronic knee pain that began in November 2023 when she used her leg to roll over in bed and felt intense pain. She had left knee surgery in June or July and she has been doing water exercises. She still can't bend her knee and her left hip is acting up. She had a shot in her knee about 3 weeks ago, and she had a shot in her hip a few months ago. She reports that her left leg is still swollen. She has been using lidocaine patches on her knee. The pain is typically on the front of the knee closer to the shin, and more on the outside of the knee. She also notes limitations in motion of her left hip like when trying to cross her left leg over the right. She reports that the more she walks the more tired she gets so she uses a cane for walking when she goes out of the house. She does have stairs at home and states she has no issues with her stairs.   PERTINENT HISTORY: See PMH above  PAIN:  Are you having pain? Yes:  NPRS scale: 0/10 at rest Pain location: Left knee Pain description: Sharp Aggravating factors: Bending the knee, walking and standing, turning a certain way Relieving factors: Lidocaine patch  PRECAUTIONS: Fall  RED FLAGS: None   WEIGHT BEARING RESTRICTIONS: No  FALLS:  Has patient fallen in last 6 months? No  LIVING  ENVIRONMENT: Lives with: lives alone Lives in: House/apartment Stairs: Yes: Internal: 2 flights of steps; on right going up and External: 3 steps; on left going up Has following equipment at home: Single point cane  PLOF: Independent with basic ADLs, Independent with household mobility without device, and Independent with community mobility with device  PATIENT GOALS: Improve left knee and hip motion, improve strength and endurance of left knee and hip for walking   OBJECTIVE:  PATIENT SURVEYS:  FOTO 50% (predicted to reach 60%)  COGNITION: Overall cognitive status: Within functional limits for tasks assessed     SENSATION: WFL  EDEMA:  Not formally assessed, patient does visually demonstrate increased left knee and lower leg edema compared to the right  MUSCLE LENGTH: Limitations in bilateral hamstring, quad, and hip flexor flexibility   POSTURE:   Rounded shoulder, slight forward trunk lean, knee varus  PALPATION: Tender to palpation generalized left anterior knee and bilateral joint line  LOWER EXTREMITY ROM:  Active ROM Right eval Left eval  Hip flexion    Hip extension  Hip abduction    Hip adduction    Hip internal rotation    Hip external rotation 3 lacking 0  Knee flexion 125 107  Knee extension    Ankle dorsiflexion    Ankle plantarflexion    Ankle inversion    Ankle eversion     (Blank rows = not tested)  LOWER EXTREMITY MMT:  MMT Right eval Left eval  Hip flexion 3 4-  Hip extension    Hip abduction 4- 3  Hip adduction    Hip internal rotation    Hip external rotation    Knee flexion 5 4  Knee extension 5 4  Ankle dorsiflexion    Ankle plantarflexion    Ankle inversion    Ankle eversion     (Blank rows = not tested)  FUNCTIONAL TESTS:  5 times sit to stand: 13 seconds 2 minute walk test: 288 ft, patient carried cane for first 1:30, the required to use cane last :30 due to left knee pain SLS: unable on left  GAIT: Distance  walked: 288 ft Assistive device utilized: Single point cane Level of assistance: Modified independence Comments: Antalgic gait on left,    TODAY'S TREATMENT:          [facility] Adult PT Treatment:                                                DATE: 02/21/2023 Therapeutic Exercise: Modified thomas stretch x 60 sec Supine heel slide with strap 5 x 10 sec hold SLR x 10 Sit to stand x 10  PATIENT EDUCATION:  Education details: Exam findings, POC, HEP Person educated: Patient Education method: Explanation, Demonstration, Tactile cues, Verbal cues, and Handouts Education comprehension: verbalized understanding, returned demonstration, verbal cues required, tactile cues required, and needs further education  HOME EXERCISE PROGRAM: Access Code: [email or web address]    ASSESSMENT: CLINICAL IMPRESSION: Patient is a 72 y.o. female who was seen today for physical therapy evaluation and treatment for chronic left knee pain with recent left knee arthroscopic surgery for meniscectomy. She currently demonstrates limitations in her left knee motion, strength of the left knee and hip, walking tolerance with gait deviations and use of SPC, flexibility deficits, balance deficits, and pain that is impacting her functional ability.    OBJECTIVE IMPAIRMENTS: Abnormal gait, decreased activity tolerance, decreased balance, decreased endurance, difficulty walking, decreased ROM, decreased strength, increased edema, impaired flexibility, postural dysfunction, and pain.   ACTIVITY LIMITATIONS: bending, sitting, standing, squatting, transfers, dressing, and locomotion level  PARTICIPATION LIMITATIONS: cleaning, shopping, and community activity  PERSONAL FACTORS: Fitness, Past/current experiences, and Time since onset of injury/illness/exacerbation are also affecting patient's functional outcome.   REHAB POTENTIAL: Good  CLINICAL DECISION MAKING: Evolving/moderate complexity  EVALUATION COMPLEXITY:  Moderate   GOALS: Goals reviewed with patient? Yes  SHORT TERM GOALS: Target date: 03/14/2023  Patient will be I with initial HEP in order to progress with therapy. Baseline: HEP provided at eval Goal status: INITIAL  2.  Patient will demonstrate left knee flexion AROM >/= 120 in order to improve transfers Baseline: 107 deg Goal status: INITIAL  3.  Patient will perform 5xSTS </=11 seconds in order to indicate improved LE strength and reduced fall risk Baseline: 13 seconds Goal status: INITIAL  LONG TERM GOALS: Target date: 04/04/2023  Patient will be I with final HEP to maintain progress from PT.  Baseline: HEP provided at eval Goal status: INITIAL  2.  Patient will report >/= 60% status on FOTO to indicate improved functional ability. Baseline: 50% Goal status: INITIAL  3.  Patient will demonstrate left knee strength 5/5 MMT to improve walking tolerance Baseline: 4/5 MMT Goal status: INITIAL  4.  Patient will improve >/= 500 ft without SPC in order to improve community access  Baseline: 288 ft Goal status: INITIAL   PLAN: PT FREQUENCY: 1-2x/week  PT DURATION: 6 weeks  PLANNED INTERVENTIONS: Therapeutic exercises, Therapeutic activity, Neuromuscular re-education, Balance training, Gait training, Patient/Family education, Self Care, Joint mobilization, Aquatic Therapy, Dry Needling, Electrical stimulation, Cryotherapy, Moist heat, Taping, Vasopneumatic device, Ionotophoresis 4mg /ml Dexamethasone, Manual therapy, and Re-evaluation  PLAN FOR NEXT SESSION: Review HEP and progress PRN, manual/stretching for left knee and hip mobility, progress left knee and hip strengthening, initiate balance training, vaso for edema management PRN   Rosana Hoes, PT, DPT, LAT, ATC 02/21/23  2:05 PM Phone: [phone] Fax: [phone]   Date of referral: 02/05/2023 Referring provider: Colvin Caroli, PA-C Referring diagnosis? Pain in left knee (ICD-10:  M25.562) Treatment diagnosis? (if different than referring diagnosis) Chronic pain of left knee (ICD-10: M25.562)  What was this (referring dx) caused by? Surgery (Type: left knee arthroscopy meniscectomy), Ongoing Issue, and Arthritis  Nature of Condition: Chronic (continuous duration > 3 months)   Laterality: Lt  Current Functional Measure Score: FOTO 50% (predicted to reach 60%)  Objective measurements identify impairments when they are compared to normal values, the uninvolved extremity, and prior level of function.  [x]  Yes  []  No  Objective assessment of functional ability: Moderate functional limitations   Briefly describe symptoms: Limitations in left knee flexion ROM, left knee and hip weakness, gait abnormalities and using SPC for community ambulation, poor activity tolerance, balance deficits  How did symptoms start: Initial meniscus tear in November 2023, recent arthroscopic surgery on left knee for menisectomy   Average pain intensity:  Last 24 hours: 0/10  Past week: 2/10  How often does the pt experience symptoms? Constantly  How much have the symptoms interfered with usual daily activities? Quite a bit  How has condition changed since care began at this facility? NA - initial visit  In general, how is the patients overall health? Good

## 2023-02-21 ENCOUNTER — Ambulatory Visit: Payer: Medicare Other | Attending: Physician Assistant | Admitting: Physical Therapy

## 2023-02-21 ENCOUNTER — Other Ambulatory Visit: Payer: Self-pay

## 2023-02-21 ENCOUNTER — Encounter: Payer: Self-pay | Admitting: Physical Therapy

## 2023-02-21 DIAGNOSIS — M25562 Pain in left knee: Secondary | ICD-10-CM | POA: Insufficient documentation

## 2023-02-21 DIAGNOSIS — G8929 Other chronic pain: Secondary | ICD-10-CM | POA: Diagnosis present

## 2023-02-21 DIAGNOSIS — M6281 Muscle weakness (generalized): Secondary | ICD-10-CM | POA: Insufficient documentation

## 2023-02-21 DIAGNOSIS — R600 Localized edema: Secondary | ICD-10-CM | POA: Insufficient documentation

## 2023-02-21 DIAGNOSIS — M25552 Pain in left hip: Secondary | ICD-10-CM | POA: Insufficient documentation

## 2023-02-21 DIAGNOSIS — R2689 Other abnormalities of gait and mobility: Secondary | ICD-10-CM | POA: Diagnosis present

## 2023-02-21 NOTE — Patient Instructions (Signed)
Access Code: [email or web address] URL: [email or web address] Date: 02/21/2023 Prepared by: Rosana Hoes  Exercises - Modified Erby Pian  - 2 x daily - 3 reps - 60 seconds hold - Supine Heel Slide with Strap  - 2 x daily - 5-10 reps - 5-10 seconds hold - Active Straight Leg Raise with Quad Set  - 1-2 x daily - 2 sets - 10 reps - Sit to Stand Without Arm Support  - 1-2 x daily - 2 sets - 10 reps

## 2023-02-26 ENCOUNTER — Ambulatory Visit: Payer: Medicare Other

## 2023-02-26 DIAGNOSIS — G8929 Other chronic pain: Secondary | ICD-10-CM

## 2023-02-26 DIAGNOSIS — M6281 Muscle weakness (generalized): Secondary | ICD-10-CM

## 2023-02-26 DIAGNOSIS — M25552 Pain in left hip: Secondary | ICD-10-CM

## 2023-02-26 DIAGNOSIS — R600 Localized edema: Secondary | ICD-10-CM

## 2023-02-26 DIAGNOSIS — R2689 Other abnormalities of gait and mobility: Secondary | ICD-10-CM

## 2023-02-26 DIAGNOSIS — M25562 Pain in left knee: Secondary | ICD-10-CM | POA: Diagnosis not present

## 2023-02-26 NOTE — Therapy (Signed)
OUTPATIENT PHYSICAL THERAPY TREATMENT NOTE   Patient Name: Haley Singh MRN: [id number] DOB:[birth year], 72 y.o., female Today's Date: 02/26/2023   END OF SESSION:  PT End of Session - 02/26/23 1650     Visit Number 2    Number of Visits 13    Date for PT Re-Evaluation 04/04/23    Authorization Type [employer] MDC    PT Start Time 1655    PT Stop Time 1735    PT Time Calculation (min) 40 min    Activity Tolerance Patient tolerated treatment well    Behavior During Therapy WFL for tasks assessed/performed              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   Hyperlipidemia    Hypertension    followed by pcp  (11-10-2019 pt stated never had a stress test)   Insomnia    MDD (major depressive disorder)    Mild persistent asthma    followed by pcp and dr Belva Crome (pulmonologist)   RLS (restless legs syndrome)    Vertebral osteomyelitis (HCC) 02/21/2021   Past Surgical History:  Procedure Laterality Date   BUBBLE STUDY  01/25/2021   Procedure: BUBBLE STUDY;  Surgeon: Jodelle Red, MD;  Location: [facility] ENDOSCOPY;  Service: Cardiovascular;;   CATARACT EXTRACTION W/ INTRAOCULAR LENS  IMPLANT, BILATERAL  2009; 2010   CYSTOSCOPY/URETEROSCOPY/HOLMIUM LASER/STENT PLACEMENT Right 08/26/2019   Procedure: CYSTOSCOPY/RETROGRADE/URETEROSCOPY/HOLMIUM LASER/STENT PLACEMENT;  Surgeon: Rene Paci, MD;  Location: [facility];  Service: Urology;  Laterality: Right;   CYSTOSCOPY/URETEROSCOPY/HOLMIUM LASER/STENT PLACEMENT Left 11/17/2019   Procedure: CYSTOSCOPY/URETEROSCOPY, RETROGRADE,LASER LITHOTRIPSY, STENT PLACEMENT;  Surgeon: Rene Paci, MD;  Location: [facility];  Service:  Urology;  Laterality: Left;   CYSTOSCOPY/URETEROSCOPY/HOLMIUM LASER/STENT PLACEMENT Left 02/19/2021   Procedure: CYSTOSCOPY/URETEROSCOPY/HOLMIUM LASER/STENT PLACEMENT;  Surgeon: Crista Elliot, MD;  Location: [facility];  Service: Urology;  Laterality: Left;   EXPLORATORY LAPAROTOMY W/ BOWEL RESECTION  01-15-2004  @[facility]    small bowel resection for stricture/ benzoar   EXTRACORPOREAL SHOCK WAVE LITHOTRIPSY Right 03/27/2018   Procedure: EXTRACORPOREAL SHOCK WAVE LITHOTRIPSY (ESWL);  Surgeon: Crist Fat, MD;  Location: [facility];  Service: Urology;  Laterality: Right;   EXTRACORPOREAL SHOCK WAVE LITHOTRIPSY  x2 2008;  2009;  2015   EXTRACORPOREAL SHOCK WAVE LITHOTRIPSY Left 11/09/2022   Procedure: LEFT EXTRACORPOREAL SHOCK WAVE LITHOTRIPSY (ESWL);  Surgeon: Rene Paci, MD;  Location: [facility];  Service: Urology;  Laterality: Left;  75 MINUTES   HOLMIUM LASER APPLICATION Left 11/17/2019   Procedure: HOLMIUM LASER APPLICATION;  Surgeon: Rene Paci, MD;  Location: [facility];  Service: Urology;  Laterality: Left;   IR FLUORO GUIDED NEEDLE PLC ASPIRATION/INJECTION LOC  02/22/2021   ORIF ANKLE FRACTURE Right 09/ 2008 @[facility]    per pt has retained hardware   TEE WITHOUT CARDIOVERSION N/A 01/25/2021   Procedure: TRANSESOPHAGEAL ECHOCARDIOGRAM (TEE);  Surgeon: Jodelle Red, MD;  Location: [facility] ENDOSCOPY;  Service: Cardiovascular;  Laterality: N/A;   TONSILLECTOMY  age 72   Patient Active Problem List   Diagnosis Date Noted   Chronic pain of right knee 07/16/2022   Groin pain, left 04/23/2022  Spinal stenosis of lumbar region 04/23/2022   Back pain of lumbar region with sciatica 04/02/2022   Arthralgia 04/02/2022   Fatigue due to excessive exertion 01/31/2022   Weight gain 01/31/2022   Penicillin allergy    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   Vertebral osteomyelitis (HCC) 02/21/2021   Intractable nausea and vomiting 02/18/2021    Hypokalemia 02/18/2021   MSSA bacteremia    Discitis of lumbar region    Osteomyelitis (HCC) 01/17/2021   Mild persistent asthma without complication 05/17/2016   OSA (obstructive sleep apnea) 03/03/2014    PCP: Macy Mis, MD  REFERRING PROVIDER: Colvin Caroli, PA-C  REFERRING DIAG: Pain in left knee  THERAPY DIAG:  Chronic pain of left knee  Pain in left hip  Muscle weakness (generalized)  Other abnormalities of gait and mobility  Localized edema  Rationale for Evaluation and Treatment: Rehabilitation  ONSET DATE: November 2023, s/p left knee arthroscopy in June or July   SUBJECTIVE:  SUBJECTIVE STATEMENT: Patient reports that her pain is minimal at rest and that it only hurts when she moves a certain way. She hasn't used a lidocaine patch over the past few days as she hasn't needed to. She reports that she had her knee surgery on 10/03/22.   PERTINENT HISTORY: See PMH above  PAIN:  Are you having pain? Yes:  NPRS scale: 1/10  Pain location: Left knee Pain description: Sharp Aggravating factors: Bending the knee, walking and standing, turning a certain way Relieving factors: Lidocaine patch  PRECAUTIONS: Fall  RED FLAGS: None   WEIGHT BEARING RESTRICTIONS: No  FALLS:  Has patient fallen in last 6 months? No  LIVING ENVIRONMENT: Lives with: lives alone Lives in: House/apartment Stairs: Yes: Internal: 2 flights of steps; on right going up and External: 3 steps; on left going up Has following equipment at home: Single point cane  PLOF: Independent with basic ADLs, Independent with household mobility without device, and Independent with community mobility with device  PATIENT GOALS: Improve left knee and hip motion, improve strength and endurance of left knee and hip for walking   OBJECTIVE:  PATIENT SURVEYS:  FOTO 50% (predicted to reach 60%)  COGNITION: Overall cognitive status: Within functional limits for tasks  assessed     SENSATION: WFL  EDEMA:  Not formally assessed, patient does visually demonstrate increased left knee and lower leg edema compared to the right  MUSCLE LENGTH: Limitations in bilateral hamstring, quad, and hip flexor flexibility   POSTURE:   Rounded shoulder, slight forward trunk lean, knee varus  PALPATION: Tender to palpation generalized left anterior knee and bilateral joint line  LOWER EXTREMITY ROM:  Active ROM Right eval Left eval  Hip flexion    Hip extension    Hip abduction    Hip adduction    Hip internal rotation    Hip external rotation 3 lacking 0  Knee flexion 125 107  Knee extension    Ankle dorsiflexion    Ankle plantarflexion    Ankle inversion    Ankle eversion     (Blank rows = not tested)  LOWER EXTREMITY MMT:  MMT Right eval Left eval  Hip flexion 3 4-  Hip extension    Hip abduction 4- 3  Hip adduction    Hip internal rotation    Hip external rotation    Knee flexion 5 4  Knee extension 5 4  Ankle dorsiflexion    Ankle plantarflexion    Ankle inversion    Ankle eversion     (  Blank rows = not tested)  FUNCTIONAL TESTS:  5 times sit to stand: 13 seconds 2 minute walk test: 288 ft, patient carried cane for first 1:30, the required to use cane last :30 due to left knee pain SLS: unable on left  GAIT: Distance walked: 288 ft Assistive device utilized: Single point cane Level of assistance: Modified independence Comments: Antalgic gait on left,    TODAY'S TREATMENT:    [facility] Adult PT Treatment:                                                DATE: 02/26/23 Therapeutic Exercise: Nustep level 5 x 5 mins while gathering subjective info Omega knee extension 5# 2x10 Omega knee flexion 20# 2x10 Standing marching 2x30" Standing hip abduction/extension 2x10 each BIL Standing heel/toe raises 2x10 Supine heel slide with strap Lt 5" hold x10 SLR x10 BIL Modified thomas stretch EOM x1' BIL STS with OH press orange ball  x10         [facility] Adult PT Treatment:                                                DATE: 02/21/2023 Therapeutic Exercise: Modified thomas stretch x 60 sec Supine heel slide with strap 5 x 10 sec hold SLR x 10 Sit to stand x 10  PATIENT EDUCATION:  Education details: Exam findings, POC, HEP Person educated: Patient Education method: Explanation, Demonstration, Tactile cues, Verbal cues, and Handouts Education comprehension: verbalized understanding, returned demonstration, verbal cues required, tactile cues required, and needs further education  HOME EXERCISE PROGRAM: Access Code: [email or web address]    ASSESSMENT: CLINICAL IMPRESSION: Patient presents to first follow up PT session reporting occasional knee pain and that the lidocaine patches have been helpful for her pain. She demonstrates decreased strength on LLE particularly with SLR, slight quad lag on LLE with 2nd set. Patient was able to tolerate all prescribed exercises with no adverse effects. Patient continues to benefit from skilled PT services and should be progressed as able to improve functional independence.    OBJECTIVE IMPAIRMENTS: Abnormal gait, decreased activity tolerance, decreased balance, decreased endurance, difficulty walking, decreased ROM, decreased strength, increased edema, impaired flexibility, postural dysfunction, and pain.   ACTIVITY LIMITATIONS: bending, sitting, standing, squatting, transfers, dressing, and locomotion level  PARTICIPATION LIMITATIONS: cleaning, shopping, and community activity  PERSONAL FACTORS: Fitness, Past/current experiences, and Time since onset of injury/illness/exacerbation are also affecting patient's functional outcome.   REHAB POTENTIAL: Good  CLINICAL DECISION MAKING: Evolving/moderate complexity  EVALUATION COMPLEXITY: Moderate   GOALS: Goals reviewed with patient? Yes  SHORT TERM GOALS: Target date: 03/14/2023  Patient will be I with initial HEP in order to progress  with therapy. Baseline: HEP provided at eval Goal status: INITIAL  2.  Patient will demonstrate left knee flexion AROM >/= 120 in order to improve transfers Baseline: 107 deg Goal status: INITIAL  3.  Patient will perform 5xSTS </=11 seconds in order to indicate improved LE strength and reduced fall risk Baseline: 13 seconds Goal status: INITIAL  LONG TERM GOALS: Target date: 04/04/2023  Patient will be I with final HEP to maintain progress from PT. Baseline: HEP provided at eval Goal status: INITIAL  2.  Patient will report >/=  60% status on FOTO to indicate improved functional ability. Baseline: 50% Goal status: INITIAL  3.  Patient will demonstrate left knee strength 5/5 MMT to improve walking tolerance Baseline: 4/5 MMT Goal status: INITIAL  4.  Patient will improve >/= 500 ft without SPC in order to improve community access  Baseline: 288 ft Goal status: INITIAL   PLAN: PT FREQUENCY: 1-2x/week  PT DURATION: 6 weeks  PLANNED INTERVENTIONS: Therapeutic exercises, Therapeutic activity, Neuromuscular re-education, Balance training, Gait training, Patient/Family education, Self Care, Joint mobilization, Aquatic Therapy, Dry Needling, Electrical stimulation, Cryotherapy, Moist heat, Taping, Vasopneumatic device, Ionotophoresis 4mg /ml Dexamethasone, Manual therapy, and Re-evaluation  PLAN FOR NEXT SESSION: Review HEP and progress PRN, manual/stretching for left knee and hip mobility, progress left knee and hip strengthening, initiate balance training, vaso for edema management PRN   Berta Minor PTA 02/26/23  5:37 PM Phone: [phone] Fax: [phone]

## 2023-02-28 ENCOUNTER — Ambulatory Visit: Payer: Medicare Other

## 2023-02-28 DIAGNOSIS — G8929 Other chronic pain: Secondary | ICD-10-CM

## 2023-02-28 DIAGNOSIS — M6281 Muscle weakness (generalized): Secondary | ICD-10-CM

## 2023-02-28 DIAGNOSIS — R600 Localized edema: Secondary | ICD-10-CM

## 2023-02-28 DIAGNOSIS — M25562 Pain in left knee: Secondary | ICD-10-CM | POA: Diagnosis not present

## 2023-02-28 DIAGNOSIS — R2689 Other abnormalities of gait and mobility: Secondary | ICD-10-CM

## 2023-02-28 DIAGNOSIS — M25552 Pain in left hip: Secondary | ICD-10-CM

## 2023-02-28 NOTE — Therapy (Signed)
OUTPATIENT PHYSICAL THERAPY TREATMENT NOTE   Patient Name: Haley Singh MRN: [id number] DOB:[birth year], 72 y.o., female Today's Date: 02/28/2023   END OF SESSION:  PT End of Session - 02/28/23 1654     Visit Number 3    Number of Visits 13    Date for PT Re-Evaluation 04/04/23    Authorization Type [employer] MDC    PT Start Time 1655    PT Stop Time 1733    PT Time Calculation (min) 38 min    Activity Tolerance Patient tolerated treatment well    Behavior During Therapy WFL for tasks assessed/performed               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   Hyperlipidemia    Hypertension    followed by pcp  (11-10-2019 pt stated never had a stress test)   Insomnia    MDD (major depressive disorder)    Mild persistent asthma    followed by pcp and dr Belva Crome (pulmonologist)   RLS (restless legs syndrome)    Vertebral osteomyelitis (HCC) 02/21/2021   Past Surgical History:  Procedure Laterality Date   BUBBLE STUDY  01/25/2021   Procedure: BUBBLE STUDY;  Surgeon: Jodelle Red, MD;  Location: [facility] ENDOSCOPY;  Service: Cardiovascular;;   CATARACT EXTRACTION W/ INTRAOCULAR LENS  IMPLANT, BILATERAL  2009; 2010   CYSTOSCOPY/URETEROSCOPY/HOLMIUM LASER/STENT PLACEMENT Right 08/26/2019   Procedure: CYSTOSCOPY/RETROGRADE/URETEROSCOPY/HOLMIUM LASER/STENT PLACEMENT;  Surgeon: Rene Paci, MD;  Location: [facility];  Service: Urology;  Laterality: Right;   CYSTOSCOPY/URETEROSCOPY/HOLMIUM LASER/STENT PLACEMENT Left 11/17/2019   Procedure: CYSTOSCOPY/URETEROSCOPY, RETROGRADE,LASER LITHOTRIPSY, STENT PLACEMENT;  Surgeon: Rene Paci, MD;  Location: [facility];   Service: Urology;  Laterality: Left;   CYSTOSCOPY/URETEROSCOPY/HOLMIUM LASER/STENT PLACEMENT Left 02/19/2021   Procedure: CYSTOSCOPY/URETEROSCOPY/HOLMIUM LASER/STENT PLACEMENT;  Surgeon: Crista Elliot, MD;  Location: [facility];  Service: Urology;  Laterality: Left;   EXPLORATORY LAPAROTOMY W/ BOWEL RESECTION  01-15-2004  @[facility]    small bowel resection for stricture/ benzoar   EXTRACORPOREAL SHOCK WAVE LITHOTRIPSY Right 03/27/2018   Procedure: EXTRACORPOREAL SHOCK WAVE LITHOTRIPSY (ESWL);  Surgeon: Crist Fat, MD;  Location: [facility];  Service: Urology;  Laterality: Right;   EXTRACORPOREAL SHOCK WAVE LITHOTRIPSY  x2 2008;  2009;  2015   EXTRACORPOREAL SHOCK WAVE LITHOTRIPSY Left 11/09/2022   Procedure: LEFT EXTRACORPOREAL SHOCK WAVE LITHOTRIPSY (ESWL);  Surgeon: Rene Paci, MD;  Location: [facility];  Service: Urology;  Laterality: Left;  75 MINUTES   HOLMIUM LASER APPLICATION Left 11/17/2019   Procedure: HOLMIUM LASER APPLICATION;  Surgeon: Rene Paci, MD;  Location: [facility];  Service: Urology;  Laterality: Left;   IR FLUORO GUIDED NEEDLE PLC ASPIRATION/INJECTION LOC  02/22/2021   ORIF ANKLE FRACTURE Right 09/ 2008 @[facility]    per pt has retained hardware   TEE WITHOUT CARDIOVERSION N/A 01/25/2021   Procedure: TRANSESOPHAGEAL ECHOCARDIOGRAM (TEE);  Surgeon: Jodelle Red, MD;  Location: [facility] ENDOSCOPY;  Service: Cardiovascular;  Laterality: N/A;   TONSILLECTOMY  age 7   Patient Active Problem List   Diagnosis Date Noted   Chronic pain of right knee 07/16/2022   Groin pain, left 04/23/2022  Spinal stenosis of lumbar region 04/23/2022   Back pain of lumbar region with sciatica 04/02/2022   Arthralgia 04/02/2022   Fatigue due to excessive exertion 01/31/2022   Weight gain 01/31/2022   Penicillin allergy    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   Vertebral osteomyelitis (HCC) 02/21/2021   Intractable nausea and vomiting 02/18/2021    Hypokalemia 02/18/2021   MSSA bacteremia    Discitis of lumbar region    Osteomyelitis (HCC) 01/17/2021   Mild persistent asthma without complication 05/17/2016   OSA (obstructive sleep apnea) 03/03/2014    PCP: Macy Mis, MD  REFERRING PROVIDER: Colvin Caroli, PA-C  REFERRING DIAG: Pain in left knee  THERAPY DIAG:  Chronic pain of left knee  Pain in left hip  Muscle weakness (generalized)  Other abnormalities of gait and mobility  Localized edema  Rationale for Evaluation and Treatment: Rehabilitation  ONSET DATE: November 2023, s/p left knee arthroscopy in June or July   SUBJECTIVE:  SUBJECTIVE STATEMENT: Patient reports that she feels very stiff today but does not describe any pain in her knee or hip.   PERTINENT HISTORY: See PMH above  PAIN:  Are you having pain? Yes:  NPRS scale: "stiff"/10  Pain location: Left knee Pain description: Sharp Aggravating factors: Bending the knee, walking and standing, turning a certain way Relieving factors: Lidocaine patch  PRECAUTIONS: Fall  RED FLAGS: None   WEIGHT BEARING RESTRICTIONS: No  FALLS:  Has patient fallen in last 6 months? No  LIVING ENVIRONMENT: Lives with: lives alone Lives in: House/apartment Stairs: Yes: Internal: 2 flights of steps; on right going up and External: 3 steps; on left going up Has following equipment at home: Single point cane  PLOF: Independent with basic ADLs, Independent with household mobility without device, and Independent with community mobility with device  PATIENT GOALS: Improve left knee and hip motion, improve strength and endurance of left knee and hip for walking   OBJECTIVE:  PATIENT SURVEYS:  FOTO 50% (predicted to reach 60%)  COGNITION: Overall cognitive status: Within functional limits for tasks assessed     SENSATION: WFL  EDEMA:  Not formally assessed, patient does visually demonstrate increased left knee and lower leg edema compared to  the right  MUSCLE LENGTH: Limitations in bilateral hamstring, quad, and hip flexor flexibility   POSTURE:   Rounded shoulder, slight forward trunk lean, knee varus  PALPATION: Tender to palpation generalized left anterior knee and bilateral joint line  LOWER EXTREMITY ROM:  Active ROM Right eval Left eval  Hip flexion    Hip extension    Hip abduction    Hip adduction    Hip internal rotation    Hip external rotation 3 lacking 0  Knee flexion 125 107  Knee extension    Ankle dorsiflexion    Ankle plantarflexion    Ankle inversion    Ankle eversion     (Blank rows = not tested)  LOWER EXTREMITY MMT:  MMT Right eval Left eval  Hip flexion 3 4-  Hip extension    Hip abduction 4- 3  Hip adduction    Hip internal rotation    Hip external rotation    Knee flexion 5 4  Knee extension 5 4  Ankle dorsiflexion    Ankle plantarflexion    Ankle inversion    Ankle eversion     (Blank rows = not tested)  FUNCTIONAL TESTS:  5 times sit to stand: 13 seconds 2 minute walk test: 288 ft, patient carried  cane for first 1:30, the required to use cane last :30 due to left knee pain SLS: unable on left  GAIT: Distance walked: 288 ft Assistive device utilized: Single point cane Level of assistance: Modified independence Comments: Antalgic gait on left,    TODAY'S TREATMENT:    [facility] Adult PT Treatment:                                                DATE: 02/28/23 Therapeutic Exercise: Nustep level 5 x 5 mins while gathering subjective info Omega knee extension 5# 2x10 with SL LLE eccentric lowering Omega knee flexion 25# 2x10 Standing hip abduction/extension 2x10 each BIL Standing heel/toe raises 2x10 Step ups 6" fwd/lat x10 ea BIL STS with OH press orange ball x10 FT stance on Airex 2x30" 1 round with head turns/nods   [facility] Adult PT Treatment:                                                DATE: 02/26/23 Therapeutic Exercise: Nustep level 5 x 5 mins while gathering  subjective info Omega knee extension 5# 2x10 Omega knee flexion 20# 2x10 Standing marching 2x30" Standing hip abduction/extension 2x10 each BIL Standing heel/toe raises 2x10 Supine heel slide with strap Lt 5" hold x10 SLR x10 BIL Modified thomas stretch EOM x1' BIL STS with OH press orange ball x10         [facility] Adult PT Treatment:                                                DATE: 02/21/2023 Therapeutic Exercise: Modified thomas stretch x 60 sec Supine heel slide with strap 5 x 10 sec hold SLR x 10 Sit to stand x 10  PATIENT EDUCATION:  Education details: Exam findings, POC, HEP Person educated: Patient Education method: Explanation, Demonstration, Tactile cues, Verbal cues, and Handouts Education comprehension: verbalized understanding, returned demonstration, verbal cues required, tactile cues required, and needs further education  HOME EXERCISE PROGRAM: Access Code: [email or web address]    ASSESSMENT: CLINICAL IMPRESSION: Patient presents to PT reporting minimal current pain and that she mostly feels stiff today, partly due to the rainy weather recently. Session today continued to focus on LE strengthening with particular focus on L quadriceps with eccentric lowering today. Patient was able to tolerate all prescribed exercises with no adverse effects. Patient continues to benefit from skilled PT services and should be progressed as able to improve functional independence.    OBJECTIVE IMPAIRMENTS: Abnormal gait, decreased activity tolerance, decreased balance, decreased endurance, difficulty walking, decreased ROM, decreased strength, increased edema, impaired flexibility, postural dysfunction, and pain.   ACTIVITY LIMITATIONS: bending, sitting, standing, squatting, transfers, dressing, and locomotion level  PARTICIPATION LIMITATIONS: cleaning, shopping, and community activity  PERSONAL FACTORS: Fitness, Past/current experiences, and Time since onset of injury/illness/exacerbation are  also affecting patient's functional outcome.   REHAB POTENTIAL: Good  CLINICAL DECISION MAKING: Evolving/moderate complexity  EVALUATION COMPLEXITY: Moderate   GOALS: Goals reviewed with patient? Yes  SHORT TERM GOALS: Target date: 03/14/2023  Patient will be I with initial HEP in order to progress with  therapy. Baseline: HEP provided at eval Goal status: INITIAL  2.  Patient will demonstrate left knee flexion AROM >/= 120 in order to improve transfers Baseline: 107 deg Goal status: INITIAL  3.  Patient will perform 5xSTS </=11 seconds in order to indicate improved LE strength and reduced fall risk Baseline: 13 seconds Goal status: INITIAL  LONG TERM GOALS: Target date: 04/04/2023  Patient will be I with final HEP to maintain progress from PT. Baseline: HEP provided at eval Goal status: INITIAL  2.  Patient will report >/= 60% status on FOTO to indicate improved functional ability. Baseline: 50% Goal status: INITIAL  3.  Patient will demonstrate left knee strength 5/5 MMT to improve walking tolerance Baseline: 4/5 MMT Goal status: INITIAL  4.  Patient will improve >/= 500 ft without SPC in order to improve community access  Baseline: 288 ft Goal status: INITIAL   PLAN: PT FREQUENCY: 1-2x/week  PT DURATION: 6 weeks  PLANNED INTERVENTIONS: Therapeutic exercises, Therapeutic activity, Neuromuscular re-education, Balance training, Gait training, Patient/Family education, Self Care, Joint mobilization, Aquatic Therapy, Dry Needling, Electrical stimulation, Cryotherapy, Moist heat, Taping, Vasopneumatic device, Ionotophoresis 4mg /ml Dexamethasone, Manual therapy, and Re-evaluation  PLAN FOR NEXT SESSION: Review HEP and progress PRN, manual/stretching for left knee and hip mobility, progress left knee and hip strengthening, initiate balance training, vaso for edema management PRN   Berta Minor PTA 02/28/23  5:33 PM Phone: [phone] Fax:  [phone]

## 2023-03-06 ENCOUNTER — Ambulatory Visit: Payer: Medicare Other | Attending: Physician Assistant | Admitting: Physical Therapy

## 2023-03-06 ENCOUNTER — Other Ambulatory Visit: Payer: Self-pay

## 2023-03-06 ENCOUNTER — Encounter: Payer: Self-pay | Admitting: Physical Therapy

## 2023-03-06 DIAGNOSIS — M6281 Muscle weakness (generalized): Secondary | ICD-10-CM | POA: Diagnosis present

## 2023-03-06 DIAGNOSIS — M25562 Pain in left knee: Secondary | ICD-10-CM | POA: Diagnosis present

## 2023-03-06 DIAGNOSIS — R2689 Other abnormalities of gait and mobility: Secondary | ICD-10-CM | POA: Insufficient documentation

## 2023-03-06 DIAGNOSIS — R600 Localized edema: Secondary | ICD-10-CM | POA: Diagnosis present

## 2023-03-06 DIAGNOSIS — M25552 Pain in left hip: Secondary | ICD-10-CM | POA: Diagnosis present

## 2023-03-06 DIAGNOSIS — G8929 Other chronic pain: Secondary | ICD-10-CM | POA: Insufficient documentation

## 2023-03-06 NOTE — Therapy (Signed)
OUTPATIENT PHYSICAL THERAPY TREATMENT NOTE   Patient Name: Haley Singh MRN: [id number] DOB:[birth year], 72 y.o., female Today's Date: 03/06/2023   END OF SESSION:  PT End of Session - 03/06/23 1539     Visit Number 4    Number of Visits 13    Date for PT Re-Evaluation 04/04/23    Authorization Type [employer] MDC    Progress Note Due on Visit 10    PT Start Time 1530    PT Stop Time 1610    PT Time Calculation (min) 40 min    Activity Tolerance Patient tolerated treatment well    Behavior During Therapy WFL for tasks assessed/performed                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   Hyperlipidemia    Hypertension    followed by pcp  (11-10-2019 pt stated never had a stress test)   Insomnia    MDD (major depressive disorder)    Mild persistent asthma    followed by pcp and dr Belva Crome (pulmonologist)   RLS (restless legs syndrome)    Vertebral osteomyelitis (HCC) 02/21/2021   Past Surgical History:  Procedure Laterality Date   BUBBLE STUDY  01/25/2021   Procedure: BUBBLE STUDY;  Surgeon: Jodelle Red, MD;  Location: [facility] ENDOSCOPY;  Service: Cardiovascular;;   CATARACT EXTRACTION W/ INTRAOCULAR LENS  IMPLANT, BILATERAL  2009; 2010   CYSTOSCOPY/URETEROSCOPY/HOLMIUM LASER/STENT PLACEMENT Right 08/26/2019   Procedure: CYSTOSCOPY/RETROGRADE/URETEROSCOPY/HOLMIUM LASER/STENT PLACEMENT;  Surgeon: Rene Paci, MD;  Location: [facility];  Service: Urology;  Laterality: Right;   CYSTOSCOPY/URETEROSCOPY/HOLMIUM LASER/STENT PLACEMENT Left 11/17/2019   Procedure: CYSTOSCOPY/URETEROSCOPY, RETROGRADE,LASER LITHOTRIPSY, STENT PLACEMENT;  Surgeon: Rene Paci, MD;  Location:  [facility];  Service: Urology;  Laterality: Left;   CYSTOSCOPY/URETEROSCOPY/HOLMIUM LASER/STENT PLACEMENT Left 02/19/2021   Procedure: CYSTOSCOPY/URETEROSCOPY/HOLMIUM LASER/STENT PLACEMENT;  Surgeon: Crista Elliot, MD;  Location: [facility];  Service: Urology;  Laterality: Left;   EXPLORATORY LAPAROTOMY W/ BOWEL RESECTION  01-15-2004  @[facility]    small bowel resection for stricture/ benzoar   EXTRACORPOREAL SHOCK WAVE LITHOTRIPSY Right 03/27/2018   Procedure: EXTRACORPOREAL SHOCK WAVE LITHOTRIPSY (ESWL);  Surgeon: Crist Fat, MD;  Location: [facility];  Service: Urology;  Laterality: Right;   EXTRACORPOREAL SHOCK WAVE LITHOTRIPSY  x2 2008;  2009;  2015   EXTRACORPOREAL SHOCK WAVE LITHOTRIPSY Left 11/09/2022   Procedure: LEFT EXTRACORPOREAL SHOCK WAVE LITHOTRIPSY (ESWL);  Surgeon: Rene Paci, MD;  Location: [facility];  Service: Urology;  Laterality: Left;  75 MINUTES   HOLMIUM LASER APPLICATION Left 11/17/2019   Procedure: HOLMIUM LASER APPLICATION;  Surgeon: Rene Paci, MD;  Location: [facility];  Service: Urology;  Laterality: Left;   IR FLUORO GUIDED NEEDLE PLC ASPIRATION/INJECTION LOC  02/22/2021   ORIF ANKLE FRACTURE Right 09/ 2008 @[facility]    per pt has retained hardware   TEE WITHOUT CARDIOVERSION N/A 01/25/2021   Procedure: TRANSESOPHAGEAL ECHOCARDIOGRAM (TEE);  Surgeon: Jodelle Red, MD;  Location: [facility] ENDOSCOPY;  Service: Cardiovascular;  Laterality: N/A;   TONSILLECTOMY  age 46   Patient Active Problem List   Diagnosis Date Noted   Chronic pain of  right knee 07/16/2022   Groin pain, left 04/23/2022   Spinal stenosis of lumbar region 04/23/2022   Back pain of lumbar region with sciatica 04/02/2022   Arthralgia 04/02/2022   Fatigue due to excessive exertion 01/31/2022   Weight gain 01/31/2022   Penicillin allergy    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   Vertebral osteomyelitis (HCC) 02/21/2021   Intractable  nausea and vomiting 02/18/2021   Hypokalemia 02/18/2021   MSSA bacteremia    Discitis of lumbar region    Osteomyelitis (HCC) 01/17/2021   Mild persistent asthma without complication 05/17/2016   OSA (obstructive sleep apnea) 03/03/2014    PCP: Macy Mis, MD  REFERRING PROVIDER: Colvin Caroli, PA-C  REFERRING DIAG: Pain in left knee  THERAPY DIAG:  Chronic pain of left knee  Pain in left hip  Muscle weakness (generalized)  Other abnormalities of gait and mobility  Localized edema  Rationale for Evaluation and Treatment: Rehabilitation  ONSET DATE: November 2023, s/p left knee arthroscopy in June or July   SUBJECTIVE:  SUBJECTIVE STATEMENT: Patient reports that her left knee and hip are bothering her and her knee continues to feel stiff and uncomfortable with bending.  PERTINENT HISTORY: See PMH above  PAIN:  Are you having pain? Yes:  NPRS scale: "stiff"/10  Pain location: Left knee Pain description: Sharp Aggravating factors: Bending the knee, walking and standing, turning a certain way Relieving factors: Lidocaine patch  PRECAUTIONS: Fall  PATIENT GOALS: Improve left knee and hip motion, improve strength and endurance of left knee and hip for walking   OBJECTIVE:  PATIENT SURVEYS:  FOTO 50% (predicted to reach 60%)  EDEMA:  Not formally assessed, patient does visually demonstrate increased left knee and lower leg edema compared to the right  MUSCLE LENGTH: Limitations in bilateral hamstring, quad, and hip flexor flexibility   POSTURE:   Rounded shoulder, slight forward trunk lean, knee varus  PALPATION: Tender to palpation generalized left anterior knee and bilateral joint line  LOWER EXTREMITY ROM:  Active ROM Right eval Left eval Left 03/06/2023  Hip flexion     Hip extension     Hip abduction     Hip adduction     Hip internal rotation     Hip external rotation 3 lacking 0 0  Knee flexion 125 107 110  Knee extension      Ankle dorsiflexion     Ankle plantarflexion     Ankle inversion     Ankle eversion      (Blank rows = not tested)  LOWER EXTREMITY MMT:  MMT Right eval Left eval  Hip flexion 3 4-  Hip extension    Hip abduction 4- 3  Hip adduction    Hip internal rotation    Hip external rotation    Knee flexion 5 4  Knee extension 5 4  Ankle dorsiflexion    Ankle plantarflexion    Ankle inversion    Ankle eversion     (Blank rows = not tested)  FUNCTIONAL TESTS:  5 times sit to stand: 13 seconds 2 minute walk test: 288 ft, patient carried cane for first 1:30, the required to use cane last :30 due to left knee pain SLS: unable on left  GAIT: Distance walked: 288 ft Assistive device utilized: Single point cane Level of assistance: Modified independence Comments: Antalgic gait on left,    TODAY'S TREATMENT:    [facility] Adult PT Treatment:  DATE: 03/06/23 Therapeutic Exercise: Nustep L5 x 6 mins with UE/LE while gathering subjective info Modified thomas stretch with passive hip extension and knee flexion 3 x 60 sec Supine quad set x 10 Bridge x 10 Supine heel slide x 10 Sit to stand 2 x 10 Standing hip flexor stretch with foot in chair 3 x 30 sec Manual: Seated and hooklying knee joint mobs Supine patellar mobs all directions Seated and supine knee PROM   [facility] Adult PT Treatment:                                                DATE: 02/28/23 Therapeutic Exercise: Nustep level 5 x 5 mins while gathering subjective info Omega knee extension 5# 2x10 with SL LLE eccentric lowering Omega knee flexion 25# 2x10 Standing hip abduction/extension 2x10 each BIL Standing heel/toe raises 2x10 Step ups 6" fwd/lat x10 ea BIL STS with OH press orange ball x10 FT stance on Airex 2x30" 1 round with head turns/nods  [facility] Adult PT Treatment:                                                DATE: 02/26/23 Therapeutic Exercise: Nustep level 5 x 5 mins  while gathering subjective info Omega knee extension 5# 2x10 Omega knee flexion 20# 2x10 Standing marching 2x30" Standing hip abduction/extension 2x10 each BIL Standing heel/toe raises 2x10 Supine heel slide with strap Lt 5" hold x10 SLR x10 BIL Modified thomas stretch EOM x1' BIL STS with OH press orange ball x10  [facility] Adult PT Treatment:                                                DATE: 02/21/2023 Therapeutic Exercise: Modified thomas stretch x 60 sec Supine heel slide with strap 5 x 10 sec hold SLR x 10 Sit to stand x 10  PATIENT EDUCATION:  Education details: HEP update Person educated: Patient Education method: Explanation, Demonstration, Tactile cues, Verbal cues, and Handouts Education comprehension: verbalized understanding, returned demonstration, verbal cues required, tactile cues required, and needs further education  HOME EXERCISE PROGRAM: Access Code: [email or web address]    ASSESSMENT: CLINICAL IMPRESSION: Patient tolerated therapy well with no adverse effects. Therapy focused primarily on improving left knee mobility with good tolerance. Incorporated manual for left knee joint and patellar mobility and progressed stretching for the left hip flexor and quad. She did report an improvement in her knee flexion this visit. Updated her HEP to progress hip flexor stretching at home. Patient would benefit from continued skilled PT to progress her mobility and strength in order to reduce pain and maximize functional ability.   OBJECTIVE IMPAIRMENTS: Abnormal gait, decreased activity tolerance, decreased balance, decreased endurance, difficulty walking, decreased ROM, decreased strength, increased edema, impaired flexibility, postural dysfunction, and pain.   ACTIVITY LIMITATIONS: bending, sitting, standing, squatting, transfers, dressing, and locomotion level  PARTICIPATION LIMITATIONS: cleaning, shopping, and community activity  PERSONAL FACTORS: Fitness, Past/current experiences,  and Time since onset of injury/illness/exacerbation are also affecting patient's functional outcome.    GOALS: Goals reviewed with patient? Yes  SHORT TERM GOALS: Target  date: 03/14/2023  Patient will be I with initial HEP in order to progress with therapy. Baseline: HEP provided at eval Goal status: INITIAL  2.  Patient will demonstrate left knee flexion AROM >/= 120 in order to improve transfers Baseline: 107 deg Goal status: INITIAL  3.  Patient will perform 5xSTS </=11 seconds in order to indicate improved LE strength and reduced fall risk Baseline: 13 seconds Goal status: INITIAL  LONG TERM GOALS: Target date: 04/04/2023  Patient will be I with final HEP to maintain progress from PT. Baseline: HEP provided at eval Goal status: INITIAL  2.  Patient will report >/= 60% status on FOTO to indicate improved functional ability. Baseline: 50% Goal status: INITIAL  3.  Patient will demonstrate left knee strength 5/5 MMT to improve walking tolerance Baseline: 4/5 MMT Goal status: INITIAL  4.  Patient will improve >/= 500 ft without SPC in order to improve community access  Baseline: 288 ft Goal status: INITIAL   PLAN: PT FREQUENCY: 1-2x/week  PT DURATION: 6 weeks  PLANNED INTERVENTIONS: Therapeutic exercises, Therapeutic activity, Neuromuscular re-education, Balance training, Gait training, Patient/Family education, Self Care, Joint mobilization, Aquatic Therapy, Dry Needling, Electrical stimulation, Cryotherapy, Moist heat, Taping, Vasopneumatic device, Ionotophoresis 4mg /ml Dexamethasone, Manual therapy, and Re-evaluation  PLAN FOR NEXT SESSION: Review HEP and progress PRN, manual/stretching for left knee and hip mobility, progress left knee and hip strengthening, initiate balance training, vaso for edema management PRN   Rosana Hoes, PT, DPT, LAT, ATC 03/06/23  4:23 PM Phone: [phone] Fax: [phone]

## 2023-03-06 NOTE — Patient Instructions (Signed)
Access Code: [email or web address] URL: [email or web address] Date: 03/06/2023 Prepared by: Rosana Hoes  Exercises - Modified Erby Pian  - 2 x daily - 3 reps - 60 seconds hold - Supine Heel Slide with Strap  - 2 x daily - 5-10 reps - 5-10 seconds hold - Active Straight Leg Raise with Quad Set  - 1-2 x daily - 2 sets - 10 reps - Sit to Stand Without Arm Support  - 1-2 x daily - 2 sets - 10 reps - Hip Flexor Stretch with Chair  - 2 x daily - 3 reps - 30 seconds hold

## 2023-03-12 ENCOUNTER — Ambulatory Visit: Payer: Medicare Other | Admitting: Physical Therapy

## 2023-03-12 ENCOUNTER — Encounter: Payer: Self-pay | Admitting: Physical Therapy

## 2023-03-12 ENCOUNTER — Other Ambulatory Visit: Payer: Self-pay

## 2023-03-12 DIAGNOSIS — R600 Localized edema: Secondary | ICD-10-CM

## 2023-03-12 DIAGNOSIS — M25552 Pain in left hip: Secondary | ICD-10-CM

## 2023-03-12 DIAGNOSIS — R2689 Other abnormalities of gait and mobility: Secondary | ICD-10-CM

## 2023-03-12 DIAGNOSIS — G8929 Other chronic pain: Secondary | ICD-10-CM

## 2023-03-12 DIAGNOSIS — M6281 Muscle weakness (generalized): Secondary | ICD-10-CM

## 2023-03-12 DIAGNOSIS — M25562 Pain in left knee: Secondary | ICD-10-CM | POA: Diagnosis not present

## 2023-03-12 NOTE — Therapy (Signed)
OUTPATIENT PHYSICAL THERAPY TREATMENT NOTE   Patient Name: Haley Singh MRN: [id number] DOB:[birth year], 72 y.o., female Today's Date: 03/12/2023   END OF SESSION:  PT End of Session - 03/12/23 1325     Visit Number 5    Number of Visits 13    Date for PT Re-Evaluation 04/04/23    Authorization Type [employer] MDC    Progress Note Due on Visit 10    PT Start Time 1315    PT Stop Time 1355    PT Time Calculation (min) 40 min    Activity Tolerance Patient tolerated treatment well    Behavior During Therapy WFL for tasks assessed/performed                 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   Hyperlipidemia    Hypertension    followed by pcp  (11-10-2019 pt stated never had a stress test)   Insomnia    MDD (major depressive disorder)    Mild persistent asthma    followed by pcp and dr Belva Crome (pulmonologist)   RLS (restless legs syndrome)    Vertebral osteomyelitis (HCC) 02/21/2021   Past Surgical History:  Procedure Laterality Date   BUBBLE STUDY  01/25/2021   Procedure: BUBBLE STUDY;  Surgeon: Jodelle Red, MD;  Location: [facility] ENDOSCOPY;  Service: Cardiovascular;;   CATARACT EXTRACTION W/ INTRAOCULAR LENS  IMPLANT, BILATERAL  2009; 2010   CYSTOSCOPY/URETEROSCOPY/HOLMIUM LASER/STENT PLACEMENT Right 08/26/2019   Procedure: CYSTOSCOPY/RETROGRADE/URETEROSCOPY/HOLMIUM LASER/STENT PLACEMENT;  Surgeon: Rene Paci, MD;  Location: [facility];  Service: Urology;  Laterality: Right;   CYSTOSCOPY/URETEROSCOPY/HOLMIUM LASER/STENT PLACEMENT Left 11/17/2019   Procedure: CYSTOSCOPY/URETEROSCOPY, RETROGRADE,LASER LITHOTRIPSY, STENT PLACEMENT;  Surgeon: Rene Paci, MD;   Location: [facility];  Service: Urology;  Laterality: Left;   CYSTOSCOPY/URETEROSCOPY/HOLMIUM LASER/STENT PLACEMENT Left 02/19/2021   Procedure: CYSTOSCOPY/URETEROSCOPY/HOLMIUM LASER/STENT PLACEMENT;  Surgeon: Crista Elliot, MD;  Location: [facility];  Service: Urology;  Laterality: Left;   EXPLORATORY LAPAROTOMY W/ BOWEL RESECTION  01-15-2004  @[facility]    small bowel resection for stricture/ benzoar   EXTRACORPOREAL SHOCK WAVE LITHOTRIPSY Right 03/27/2018   Procedure: EXTRACORPOREAL SHOCK WAVE LITHOTRIPSY (ESWL);  Surgeon: Crist Fat, MD;  Location: [facility];  Service: Urology;  Laterality: Right;   EXTRACORPOREAL SHOCK WAVE LITHOTRIPSY  x2 2008;  2009;  2015   EXTRACORPOREAL SHOCK WAVE LITHOTRIPSY Left 11/09/2022   Procedure: LEFT EXTRACORPOREAL SHOCK WAVE LITHOTRIPSY (ESWL);  Surgeon: Rene Paci, MD;  Location: [facility];  Service: Urology;  Laterality: Left;  75 MINUTES   HOLMIUM LASER APPLICATION Left 11/17/2019   Procedure: HOLMIUM LASER APPLICATION;  Surgeon: Rene Paci, MD;  Location: [facility];  Service: Urology;  Laterality: Left;   IR FLUORO GUIDED NEEDLE PLC ASPIRATION/INJECTION LOC  02/22/2021   ORIF ANKLE FRACTURE Right 09/ 2008 @[facility]    per pt has retained hardware   TEE WITHOUT CARDIOVERSION N/A 01/25/2021   Procedure: TRANSESOPHAGEAL ECHOCARDIOGRAM (TEE);  Surgeon: Jodelle Red, MD;  Location: [facility] ENDOSCOPY;  Service: Cardiovascular;  Laterality: N/A;   TONSILLECTOMY  age 50   Patient Active Problem List   Diagnosis Date Noted   Chronic pain  of right knee 07/16/2022   Groin pain, left 04/23/2022   Spinal stenosis of lumbar region 04/23/2022   Back pain of lumbar region with sciatica 04/02/2022   Arthralgia 04/02/2022   Fatigue due to excessive exertion 01/31/2022   Weight gain 01/31/2022   Penicillin allergy    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   Vertebral osteomyelitis (HCC) 02/21/2021    Intractable nausea and vomiting 02/18/2021   Hypokalemia 02/18/2021   MSSA bacteremia    Discitis of lumbar region    Osteomyelitis (HCC) 01/17/2021   Mild persistent asthma without complication 05/17/2016   OSA (obstructive sleep apnea) 03/03/2014    PCP: Macy Mis, MD  REFERRING PROVIDER: Colvin Caroli, PA-C  REFERRING DIAG: Pain in left knee  THERAPY DIAG:  Chronic pain of left knee  Pain in left hip  Muscle weakness (generalized)  Other abnormalities of gait and mobility  Localized edema  Rationale for Evaluation and Treatment: Rehabilitation  ONSET DATE: November 2023, s/p left knee arthroscopy in June or July   SUBJECTIVE:  SUBJECTIVE STATEMENT: Patient reports everything is hurting today because she halved her celebrex dose and with the change in weather.   PERTINENT HISTORY: See PMH above  PAIN:  Are you having pain? Yes:  NPRS scale: 2-3/10  Pain location: Left knee Pain description: Sharp Aggravating factors: Bending the knee, walking and standing, turning a certain way Relieving factors: Lidocaine patch  PRECAUTIONS: Fall  PATIENT GOALS: Improve left knee and hip motion, improve strength and endurance of left knee and hip for walking   OBJECTIVE:  PATIENT SURVEYS:  FOTO 50% (predicted to reach 60%)  EDEMA:  Not formally assessed, patient does visually demonstrate increased left knee and lower leg edema compared to the right  MUSCLE LENGTH: Limitations in bilateral hamstring, quad, and hip flexor flexibility   POSTURE:   Rounded shoulder, slight forward trunk lean, knee varus  PALPATION: Tender to palpation generalized left anterior knee and bilateral joint line  LOWER EXTREMITY ROM:  Active ROM Right eval Left eval Left 03/06/2023 Left 03/12/2023  Hip flexion      Hip extension      Hip abduction      Hip adduction      Hip internal rotation      Hip external rotation 3 lacking 0 0   Knee flexion 125 107 110 120   Knee extension      Ankle dorsiflexion      Ankle plantarflexion      Ankle inversion      Ankle eversion       (Blank rows = not tested)  LOWER EXTREMITY MMT:  MMT Right eval Left eval  Hip flexion 3 4-  Hip extension    Hip abduction 4- 3  Hip adduction    Hip internal rotation    Hip external rotation    Knee flexion 5 4  Knee extension 5 4  Ankle dorsiflexion    Ankle plantarflexion    Ankle inversion    Ankle eversion     (Blank rows = not tested)  FUNCTIONAL TESTS:  5 times sit to stand: 13 seconds 2 minute walk test: 288 ft, patient carried cane for first 1:30, the required to use cane last :30 due to left knee pain SLS: unable on left  GAIT: Distance walked: 288 ft Assistive device utilized: Single point cane Level of assistance: Modified independence Comments: Antalgic gait on left,    TODAY'S TREATMENT:    [facility] Adult PT Treatment:  DATE: 03/12/23 Therapeutic Exercise: Nustep L5 x 6 mins with UE/LE while gathering subjective info Modified thomas stretch 3 x 60 sec each Supine heel slide with strap Leg press (BATCA) 25# 3 x 10 Manual: Seated and hooklying knee joint mobs Supine patellar mobs all directions Seated and supine knee PROM   [facility] Adult PT Treatment:                                                DATE: 03/06/23 Therapeutic Exercise: Nustep L5 x 6 mins with UE/LE while gathering subjective info Modified thomas stretch with passive hip extension and knee flexion 3 x 60 sec Supine quad set x 10 Bridge x 10 Supine heel slide x 10 Sit to stand 2 x 10 Standing hip flexor stretch with foot in chair 3 x 30 sec Manual: Seated and hooklying knee joint mobs Supine patellar mobs all directions Seated and supine knee PROM  [facility] Adult PT Treatment:                                                DATE: 02/28/23 Therapeutic Exercise: Nustep level 5 x 5 mins while gathering subjective info Omega knee  extension 5# 2x10 with SL LLE eccentric lowering Omega knee flexion 25# 2x10 Standing hip abduction/extension 2x10 each BIL Standing heel/toe raises 2x10 Step ups 6" fwd/lat x10 ea BIL STS with OH press orange ball x10 FT stance on Airex 2x30" 1 round with head turns/nods  [facility] Adult PT Treatment:                                                DATE: 02/26/23 Therapeutic Exercise: Nustep level 5 x 5 mins while gathering subjective info Omega knee extension 5# 2x10 Omega knee flexion 20# 2x10 Standing marching 2x30" Standing hip abduction/extension 2x10 each BIL Standing heel/toe raises 2x10 Supine heel slide with strap Lt 5" hold x10 SLR x10 BIL Modified thomas stretch EOM x1' BIL STS with OH press orange ball x10  PATIENT EDUCATION:  Education details: HEP update Person educated: Patient Education method: Explanation, Demonstration, Tactile cues, Verbal cues, and Handouts Education comprehension: verbalized understanding, returned demonstration, verbal cues required, tactile cues required, and needs further education  HOME EXERCISE PROGRAM: Access Code: [email or web address]    ASSESSMENT: CLINICAL IMPRESSION: Patient tolerated therapy well with no adverse effects. Therapy continues to focus on improving left knee mobility and progressing knee strength with good tolerance. She does exhibit improved knee flexion following therapy and reports improvement in her symptoms. Incorporated leg press machine this visit with patient reporting pain at end range flexion. No changes made to HEP this visit. Patient would benefit from continued skilled PT to progress her mobility and strength in order to reduce pain and maximize functional ability.   OBJECTIVE IMPAIRMENTS: Abnormal gait, decreased activity tolerance, decreased balance, decreased endurance, difficulty walking, decreased ROM, decreased strength, increased edema, impaired flexibility, postural dysfunction, and pain.   ACTIVITY LIMITATIONS:  bending, sitting, standing, squatting, transfers, dressing, and locomotion level  PARTICIPATION LIMITATIONS: cleaning, shopping, and community activity  PERSONAL FACTORS: Fitness, Past/current experiences, and Time  since onset of injury/illness/exacerbation are also affecting patient's functional outcome.    GOALS: Goals reviewed with patient? Yes  SHORT TERM GOALS: Target date: 03/14/2023  Patient will be I with initial HEP in order to progress with therapy. Baseline: HEP provided at eval Goal status: INITIAL  2.  Patient will demonstrate left knee flexion AROM >/= 120 in order to improve transfers Baseline: 107 deg Goal status: INITIAL  3.  Patient will perform 5xSTS </=11 seconds in order to indicate improved LE strength and reduced fall risk Baseline: 13 seconds Goal status: INITIAL  LONG TERM GOALS: Target date: 04/04/2023  Patient will be I with final HEP to maintain progress from PT. Baseline: HEP provided at eval Goal status: INITIAL  2.  Patient will report >/= 60% status on FOTO to indicate improved functional ability. Baseline: 50% Goal status: INITIAL  3.  Patient will demonstrate left knee strength 5/5 MMT to improve walking tolerance Baseline: 4/5 MMT Goal status: INITIAL  4.  Patient will improve >/= 500 ft without SPC in order to improve community access  Baseline: 288 ft Goal status: INITIAL   PLAN: PT FREQUENCY: 1-2x/week  PT DURATION: 6 weeks  PLANNED INTERVENTIONS: Therapeutic exercises, Therapeutic activity, Neuromuscular re-education, Balance training, Gait training, Patient/Family education, Self Care, Joint mobilization, Aquatic Therapy, Dry Needling, Electrical stimulation, Cryotherapy, Moist heat, Taping, Vasopneumatic device, Ionotophoresis 4mg /ml Dexamethasone, Manual therapy, and Re-evaluation  PLAN FOR NEXT SESSION: Review HEP and progress PRN, manual/stretching for left knee and hip mobility, progress left knee and hip  strengthening, initiate balance training, vaso for edema management PRN   Rosana Hoes, PT, DPT, LAT, ATC 03/12/23  2:59 PM Phone: [phone] Fax: [phone]

## 2023-03-14 ENCOUNTER — Other Ambulatory Visit: Payer: Self-pay

## 2023-03-14 ENCOUNTER — Ambulatory Visit: Payer: Medicare Other | Admitting: Physical Therapy

## 2023-03-14 ENCOUNTER — Encounter: Payer: Self-pay | Admitting: Physical Therapy

## 2023-03-14 DIAGNOSIS — M6281 Muscle weakness (generalized): Secondary | ICD-10-CM

## 2023-03-14 DIAGNOSIS — R600 Localized edema: Secondary | ICD-10-CM

## 2023-03-14 DIAGNOSIS — M25562 Pain in left knee: Secondary | ICD-10-CM | POA: Diagnosis not present

## 2023-03-14 DIAGNOSIS — R2689 Other abnormalities of gait and mobility: Secondary | ICD-10-CM

## 2023-03-14 DIAGNOSIS — M25552 Pain in left hip: Secondary | ICD-10-CM

## 2023-03-14 DIAGNOSIS — G8929 Other chronic pain: Secondary | ICD-10-CM

## 2023-03-14 NOTE — Therapy (Signed)
OUTPATIENT PHYSICAL THERAPY TREATMENT NOTE   Patient Name: Haley Singh MRN: [id number] DOB:April 21, 1951, 72 y.o., female Today's Date: 03/14/2023   END OF SESSION:  PT End of Session - 03/14/23 1529     Visit Number 6    Number of Visits 13    Date for PT Re-Evaluation 04/04/23    Authorization Type [employer] MDC    Progress Note Due on Visit 10    PT Start Time 1530    PT Stop Time 1615    PT Time Calculation (min) 45 min    Activity Tolerance Patient tolerated treatment well    Behavior During Therapy WFL for tasks assessed/performed                  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   Hyperlipidemia    Hypertension    followed by pcp  (11-10-2019 pt stated never had a stress test)   Insomnia    MDD (major depressive disorder)    Mild persistent asthma    followed by pcp and dr Belva Crome (pulmonologist)   RLS (restless legs syndrome)    Vertebral osteomyelitis (HCC) 02/21/2021   Past Surgical History:  Procedure Laterality Date   BUBBLE STUDY  01/25/2021   Procedure: BUBBLE STUDY;  Surgeon: Jodelle Red, MD;  Location: [facility] ENDOSCOPY;  Service: Cardiovascular;;   CATARACT EXTRACTION W/ INTRAOCULAR LENS  IMPLANT, BILATERAL  2009; 2010   CYSTOSCOPY/URETEROSCOPY/HOLMIUM LASER/STENT PLACEMENT Right 08/26/2019   Procedure: CYSTOSCOPY/RETROGRADE/URETEROSCOPY/HOLMIUM LASER/STENT PLACEMENT;  Surgeon: Rene Paci, MD;  Location: [facility];  Service: Urology;  Laterality: Right;   CYSTOSCOPY/URETEROSCOPY/HOLMIUM LASER/STENT PLACEMENT Left 11/17/2019   Procedure: CYSTOSCOPY/URETEROSCOPY, RETROGRADE,LASER LITHOTRIPSY, STENT PLACEMENT;  Surgeon: Rene Paci, MD;   Location: [facility];  Service: Urology;  Laterality: Left;   CYSTOSCOPY/URETEROSCOPY/HOLMIUM LASER/STENT PLACEMENT Left 02/19/2021   Procedure: CYSTOSCOPY/URETEROSCOPY/HOLMIUM LASER/STENT PLACEMENT;  Surgeon: Crista Elliot, MD;  Location: [facility];  Service: Urology;  Laterality: Left;   EXPLORATORY LAPAROTOMY W/ BOWEL RESECTION  01-15-2004  @[facility]    small bowel resection for stricture/ benzoar   EXTRACORPOREAL SHOCK WAVE LITHOTRIPSY Right 03/27/2018   Procedure: EXTRACORPOREAL SHOCK WAVE LITHOTRIPSY (ESWL);  Surgeon: Crist Fat, MD;  Location: [facility];  Service: Urology;  Laterality: Right;   EXTRACORPOREAL SHOCK WAVE LITHOTRIPSY  x2 2008;  2009;  2015   EXTRACORPOREAL SHOCK WAVE LITHOTRIPSY Left 11/09/2022   Procedure: LEFT EXTRACORPOREAL SHOCK WAVE LITHOTRIPSY (ESWL);  Surgeon: Rene Paci, MD;  Location: [facility];  Service: Urology;  Laterality: Left;  75 MINUTES   HOLMIUM LASER APPLICATION Left 11/17/2019   Procedure: HOLMIUM LASER APPLICATION;  Surgeon: Rene Paci, MD;  Location: [facility];  Service: Urology;  Laterality: Left;   IR FLUORO GUIDED NEEDLE PLC ASPIRATION/INJECTION LOC  02/22/2021   ORIF ANKLE FRACTURE Right 09/ 2008 @[facility]    per pt has retained hardware   TEE WITHOUT CARDIOVERSION N/A 01/25/2021   Procedure: TRANSESOPHAGEAL ECHOCARDIOGRAM (TEE);  Surgeon: Jodelle Red, MD;  Location: [facility] ENDOSCOPY;  Service: Cardiovascular;  Laterality: N/A;   TONSILLECTOMY  age 34   Patient Active Problem List   Diagnosis Date Noted   Chronic  pain of right knee 07/16/2022   Groin pain, left 04/23/2022   Spinal stenosis of lumbar region 04/23/2022   Back pain of lumbar region with sciatica 04/02/2022   Arthralgia 04/02/2022   Fatigue due to excessive exertion 01/31/2022   Weight gain 01/31/2022   Penicillin allergy    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   Vertebral osteomyelitis (HCC) 02/21/2021    Intractable nausea and vomiting 02/18/2021   Hypokalemia 02/18/2021   MSSA bacteremia    Discitis of lumbar region    Osteomyelitis (HCC) 01/17/2021   Mild persistent asthma without complication 05/17/2016   OSA (obstructive sleep apnea) 03/03/2014    PCP: Macy Mis, MD  REFERRING PROVIDER: Colvin Caroli, PA-C  REFERRING DIAG: Pain in left knee  THERAPY DIAG:  Chronic pain of left knee  Pain in left hip  Muscle weakness (generalized)  Other abnormalities of gait and mobility  Localized edema  Rationale for Evaluation and Treatment: Rehabilitation  ONSET DATE: November 2023, s/p left knee arthroscopy 10/03/22 and injection 02/05/2023   SUBJECTIVE:  SUBJECTIVE STATEMENT: Patient reports her left knee was swollen and stiffer this morning and she put an ice pack on it before she came. Reports pain in the front of the knee.   PERTINENT HISTORY: See PMH above  PAIN:  Are you having pain? Yes:  NPRS scale: 3/10  Pain location: Left knee Pain description: Sharp, stiff Aggravating factors: Bending the knee, walking and standing, turning a certain way Relieving factors: Lidocaine patch  PRECAUTIONS: Fall  PATIENT GOALS: Improve left knee and hip motion, improve strength and endurance of left knee and hip for walking   OBJECTIVE:  PATIENT SURVEYS:  FOTO 50% (predicted to reach 60%)  EDEMA:  Not formally assessed, patient does visually demonstrate increased left knee and lower leg edema compared to the right  MUSCLE LENGTH: Limitations in bilateral hamstring, quad, and hip flexor flexibility   POSTURE:   Rounded shoulder, slight forward trunk lean, knee varus  PALPATION: Tender to palpation generalized left anterior knee and bilateral joint line  LOWER EXTREMITY ROM:  Active ROM Right eval Left eval Left 03/06/2023 Left 03/12/2023  Hip flexion      Hip extension      Hip abduction      Hip adduction      Hip internal rotation      Hip external  rotation 3 lacking 0 0   Knee flexion 125 107 110 120  Knee extension      Ankle dorsiflexion      Ankle plantarflexion      Ankle inversion      Ankle eversion       (Blank rows = not tested)  LOWER EXTREMITY MMT:  MMT Right eval Left eval  Hip flexion 3 4-  Hip extension    Hip abduction 4- 3  Hip adduction    Hip internal rotation    Hip external rotation    Knee flexion 5 4  Knee extension 5 4  Ankle dorsiflexion    Ankle plantarflexion    Ankle inversion    Ankle eversion     (Blank rows = not tested)  FUNCTIONAL TESTS:  5 times sit to stand: 13 seconds  03/14/2023: 11 seconds 2 minute walk test: 288 ft, patient carried cane for first 1:30, the required to use cane last :30 due to left knee pain SLS: unable on left  GAIT: Distance walked: 288 ft Assistive device utilized: Single point cane Level of assistance: Modified independence  Comments: Antalgic gait on left,    TODAY'S TREATMENT:    [facility] Adult PT Treatment:                                                DATE: 03/14/23 Therapeutic Exercise: Nustep L7 x 8 mins with LE to improve LE endurance and workload capacity Modified thomas stretch 2 x 60 sec each LAQ with 5# 3 x 10 Leg press (BATCA) 25# 3 x 10 Leg press (cybex) 60# 3 x 10 Manual: Seated and hooklying knee joint mobs Supine patellar mobs all directions Seated and supine knee flexion PROM   [facility] Adult PT Treatment:                                                DATE: 03/12/23 Therapeutic Exercise: Nustep L5 x 6 mins with UE/LE while gathering subjective info Modified thomas stretch 3 x 60 sec each Supine heel slide with strap Leg press (BATCA) 25# 3 x 10 Manual: Seated and hooklying knee joint mobs Supine patellar mobs all directions Seated and supine knee PROM  [facility] Adult PT Treatment:                                                DATE: 03/06/23 Therapeutic Exercise: Nustep L5 x 6 mins with UE/LE while gathering subjective  info Modified thomas stretch with passive hip extension and knee flexion 3 x 60 sec Supine quad set x 10 Bridge x 10 Supine heel slide x 10 Sit to stand 2 x 10 Standing hip flexor stretch with foot in chair 3 x 30 sec Manual: Seated and hooklying knee joint mobs Supine patellar mobs all directions Seated and supine knee PROM  [facility] Adult PT Treatment:                                                DATE: 02/28/23 Therapeutic Exercise: Nustep level 5 x 5 mins while gathering subjective info Omega knee extension 5# 2x10 with SL LLE eccentric lowering Omega knee flexion 25# 2x10 Standing hip abduction/extension 2x10 each BIL Standing heel/toe raises 2x10 Step ups 6" fwd/lat x10 ea BIL STS with OH press orange ball x10 FT stance on Airex 2x30" 1 round with head turns/nods  PATIENT EDUCATION:  Education details: HEP Person educated: Patient Education method: Programmer, multimedia, Demonstration, Tactile cues, Verbal cues Education comprehension: verbalized understanding, returned demonstration, verbal cues required, tactile cues required, and needs further education  HOME EXERCISE PROGRAM: Access Code: [email or web address]    ASSESSMENT: CLINICAL IMPRESSION: Patient tolerated therapy well with no adverse effects. Therapy continues to focus on improving knee mobility, especially flexion, and progressing knee strength. She was able to progress with quad strengthening this visit with good tolerance and increased resistance with leg press. Overall she reports improvement in her symptoms within and post session but does continue to have pain, swelling, and limitations with the left knee. She did demonstrate improvement in her 5xSTS this visit achieving  all her STGs. No changes made to HEP this visit. She does continue to exhibit bilateral hip flexor and quad tightness that improves with strengthening.    OBJECTIVE IMPAIRMENTS: Abnormal gait, decreased activity tolerance, decreased balance, decreased endurance,  difficulty walking, decreased ROM, decreased strength, increased edema, impaired flexibility, postural dysfunction, and pain.   ACTIVITY LIMITATIONS: bending, sitting, standing, squatting, transfers, dressing, and locomotion level  PARTICIPATION LIMITATIONS: cleaning, shopping, and community activity  PERSONAL FACTORS: Fitness, Past/current experiences, and Time since onset of injury/illness/exacerbation are also affecting patient's functional outcome.    GOALS: Goals reviewed with patient? Yes  SHORT TERM GOALS: Target date: 03/14/2023  Patient will be I with initial HEP in order to progress with therapy. Baseline: HEP provided at eval 03/14/2023: independent with initial HEP Goal status: MET  2.  Patient will demonstrate left knee flexion AROM >/= 120 in order to improve transfers Baseline: 107 deg 03/12/2023: 120 deg Goal status: MET  3.  Patient will perform 5xSTS </=11 seconds in order to indicate improved LE strength and reduced fall risk Baseline: 13 seconds 03/14/2023: 11 seconds Goal status: MET  LONG TERM GOALS: Target date: 04/04/2023  Patient will be I with final HEP to maintain progress from PT. Baseline: HEP provided at eval Goal status: INITIAL  2.  Patient will report >/= 60% status on FOTO to indicate improved functional ability. Baseline: 50% Goal status: INITIAL  3.  Patient will demonstrate left knee strength 5/5 MMT to improve walking tolerance Baseline: 4/5 MMT Goal status: INITIAL  4.  Patient will improve >/= 500 ft without SPC in order to improve community access  Baseline: 288 ft Goal status: INITIAL   PLAN: PT FREQUENCY: 1-2x/week  PT DURATION: 6 weeks  PLANNED INTERVENTIONS: Therapeutic exercises, Therapeutic activity, Neuromuscular re-education, Balance training, Gait training, Patient/Family education, Self Care, Joint mobilization, Aquatic Therapy, Dry Needling, Electrical stimulation, Cryotherapy, Moist heat, Taping,  Vasopneumatic device, Ionotophoresis 4mg /ml Dexamethasone, Manual therapy, and Re-evaluation  PLAN FOR NEXT SESSION: Review HEP and progress PRN, manual/stretching for left knee and hip mobility, progress left knee and hip strengthening, initiate balance training, vaso for edema management PRN   Rosana Hoes, PT, DPT, LAT, ATC 03/14/23  4:18 PM Phone: [phone] Fax: [phone]

## 2023-03-19 ENCOUNTER — Encounter: Payer: Self-pay | Admitting: Physical Therapy

## 2023-03-19 ENCOUNTER — Ambulatory Visit: Payer: Medicare Other | Admitting: Physical Therapy

## 2023-03-19 ENCOUNTER — Other Ambulatory Visit: Payer: Self-pay

## 2023-03-19 DIAGNOSIS — M25552 Pain in left hip: Secondary | ICD-10-CM

## 2023-03-19 DIAGNOSIS — G8929 Other chronic pain: Secondary | ICD-10-CM

## 2023-03-19 DIAGNOSIS — R600 Localized edema: Secondary | ICD-10-CM

## 2023-03-19 DIAGNOSIS — M25562 Pain in left knee: Secondary | ICD-10-CM | POA: Diagnosis not present

## 2023-03-19 DIAGNOSIS — R2689 Other abnormalities of gait and mobility: Secondary | ICD-10-CM

## 2023-03-19 DIAGNOSIS — M6281 Muscle weakness (generalized): Secondary | ICD-10-CM

## 2023-03-19 NOTE — Therapy (Signed)
OUTPATIENT PHYSICAL THERAPY TREATMENT NOTE   Patient Name: Haley Singh MRN: [id number] DOB:[birth year], 72 y.o., female Today's Date: 03/19/2023   END OF SESSION:  PT End of Session - 03/19/23 1453     Visit Number 7    Number of Visits 13    Date for PT Re-Evaluation 04/04/23    Authorization Type [employer] MDC    Progress Note Due on Visit 10    PT Start Time 1445    PT Stop Time 1530    PT Time Calculation (min) 45 min    Activity Tolerance Patient tolerated treatment well    Behavior During Therapy WFL for tasks assessed/performed                   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   Hyperlipidemia    Hypertension    followed by pcp  (11-10-2019 pt stated never had a stress test)   Insomnia    MDD (major depressive disorder)    Mild persistent asthma    followed by pcp and dr Belva Crome (pulmonologist)   RLS (restless legs syndrome)    Vertebral osteomyelitis (HCC) 02/21/2021   Past Surgical History:  Procedure Laterality Date   BUBBLE STUDY  01/25/2021   Procedure: BUBBLE STUDY;  Surgeon: Jodelle Red, MD;  Location: [facility] ENDOSCOPY;  Service: Cardiovascular;;   CATARACT EXTRACTION W/ INTRAOCULAR LENS  IMPLANT, BILATERAL  2009; 2010   CYSTOSCOPY/URETEROSCOPY/HOLMIUM LASER/STENT PLACEMENT Right 08/26/2019   Procedure: CYSTOSCOPY/RETROGRADE/URETEROSCOPY/HOLMIUM LASER/STENT PLACEMENT;  Surgeon: Rene Paci, MD;  Location: [facility];  Service: Urology;  Laterality: Right;   CYSTOSCOPY/URETEROSCOPY/HOLMIUM LASER/STENT PLACEMENT Left 11/17/2019   Procedure: CYSTOSCOPY/URETEROSCOPY, RETROGRADE,LASER LITHOTRIPSY, STENT PLACEMENT;  Surgeon: Rene Paci, MD;   Location: [facility];  Service: Urology;  Laterality: Left;   CYSTOSCOPY/URETEROSCOPY/HOLMIUM LASER/STENT PLACEMENT Left 02/19/2021   Procedure: CYSTOSCOPY/URETEROSCOPY/HOLMIUM LASER/STENT PLACEMENT;  Surgeon: Crista Elliot, MD;  Location: [facility];  Service: Urology;  Laterality: Left;   EXPLORATORY LAPAROTOMY W/ BOWEL RESECTION  01-15-2004  @[facility]    small bowel resection for stricture/ benzoar   EXTRACORPOREAL SHOCK WAVE LITHOTRIPSY Right 03/27/2018   Procedure: EXTRACORPOREAL SHOCK WAVE LITHOTRIPSY (ESWL);  Surgeon: Crist Fat, MD;  Location: [facility];  Service: Urology;  Laterality: Right;   EXTRACORPOREAL SHOCK WAVE LITHOTRIPSY  x2 2008;  2009;  2015   EXTRACORPOREAL SHOCK WAVE LITHOTRIPSY Left 11/09/2022   Procedure: LEFT EXTRACORPOREAL SHOCK WAVE LITHOTRIPSY (ESWL);  Surgeon: Rene Paci, MD;  Location: [facility];  Service: Urology;  Laterality: Left;  75 MINUTES   HOLMIUM LASER APPLICATION Left 11/17/2019   Procedure: HOLMIUM LASER APPLICATION;  Surgeon: Rene Paci, MD;  Location: [facility];  Service: Urology;  Laterality: Left;   IR FLUORO GUIDED NEEDLE PLC ASPIRATION/INJECTION LOC  02/22/2021   ORIF ANKLE FRACTURE Right 09/ 2008 @[facility]    per pt has retained hardware   TEE WITHOUT CARDIOVERSION N/A 01/25/2021   Procedure: TRANSESOPHAGEAL ECHOCARDIOGRAM (TEE);  Surgeon: Jodelle Red, MD;  Location: [facility] ENDOSCOPY;  Service: Cardiovascular;  Laterality: N/A;   TONSILLECTOMY  age 30   Patient Active Problem List   Diagnosis Date Noted  Chronic pain of right knee 07/16/2022   Groin pain, left 04/23/2022   Spinal stenosis of lumbar region 04/23/2022   Back pain of lumbar region with sciatica 04/02/2022   Arthralgia 04/02/2022   Fatigue due to excessive exertion 01/31/2022   Weight gain 01/31/2022   Penicillin allergy    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   Vertebral osteomyelitis (HCC) 02/21/2021    Intractable nausea and vomiting 02/18/2021   Hypokalemia 02/18/2021   MSSA bacteremia    Discitis of lumbar region    Osteomyelitis (HCC) 01/17/2021   Mild persistent asthma without complication 05/17/2016   OSA (obstructive sleep apnea) 03/03/2014    PCP: Macy Mis, MD  REFERRING PROVIDER: Colvin Caroli, PA-C  REFERRING DIAG: Pain in left knee  THERAPY DIAG:  Chronic pain of left knee  Pain in left hip  Muscle weakness (generalized)  Other abnormalities of gait and mobility  Localized edema  Rationale for Evaluation and Treatment: Rehabilitation  ONSET DATE: November 2023, s/p left knee arthroscopy 10/03/22 and injection 02/05/2023   SUBJECTIVE:  SUBJECTIVE STATEMENT: Patient reports she did some walking since last visit and did better than expected. Reports that her knee did start to act up a little bit so she put a patch on the front of her knee, she attributes it to the cold.  PERTINENT HISTORY: See PMH above  PAIN:  Are you having pain? Yes:  NPRS scale: 0/10  Pain location: Left knee Pain description: Sharp, stiff Aggravating factors: Bending the knee, walking and standing, turning a certain way Relieving factors: Lidocaine patch  PRECAUTIONS: Fall  PATIENT GOALS: Improve left knee and hip motion, improve strength and endurance of left knee and hip for walking   OBJECTIVE:  PATIENT SURVEYS:  FOTO 50% (predicted to reach 60%)  EDEMA:  Not formally assessed, patient does visually demonstrate increased left knee and lower leg edema compared to the right  MUSCLE LENGTH: Limitations in bilateral hamstring, quad, and hip flexor flexibility   POSTURE:   Rounded shoulder, slight forward trunk lean, knee varus  PALPATION: Tender to palpation generalized left anterior knee and bilateral joint line  LOWER EXTREMITY ROM:  Active ROM Right eval Left eval Left 03/06/2023 Left 03/12/2023  Hip flexion      Hip extension      Hip abduction       Hip adduction      Hip internal rotation      Hip external rotation 3 lacking 0 0   Knee flexion 125 107 110 120  Knee extension      Ankle dorsiflexion      Ankle plantarflexion      Ankle inversion      Ankle eversion       (Blank rows = not tested)  LOWER EXTREMITY MMT:  MMT Right eval Left eval  Hip flexion 3 4-  Hip extension    Hip abduction 4- 3  Hip adduction    Hip internal rotation    Hip external rotation    Knee flexion 5 4  Knee extension 5 4  Ankle dorsiflexion    Ankle plantarflexion    Ankle inversion    Ankle eversion     (Blank rows = not tested)  FUNCTIONAL TESTS:  5 times sit to stand: 13 seconds  03/14/2023: 11 seconds 2 minute walk test: 288 ft, patient carried cane for first 1:30, the required to use cane last :30 due to left knee pain SLS: unable on left  GAIT: Distance walked:  288 ft Assistive device utilized: Single point cane Level of assistance: Modified independence Comments: Antalgic gait on left,    TODAY'S TREATMENT:    [facility] Adult PT Treatment:                                                DATE: 03/19/23 Therapeutic Exercise: Nustep L6 x 6 mins with LE to improve LE endurance and workload capacity SLR 3 x 10 with left Sidelying hip abduction 3 x 10 with left LAQ with 5# 3 x 10 Seated hamstring curl with green 3 x 10 Seated hamstring stretch 3 x 20 sec Standing hip flexor stretch with step 3 x 20 sec each Leg press (cybex) 60# 3 x 10   [facility] Adult PT Treatment:                                                DATE: 03/14/23 Therapeutic Exercise: Nustep L7 x 8 mins with LE to improve LE endurance and workload capacity Modified thomas stretch 2 x 60 sec each LAQ with 5# 3 x 10 Leg press (BATCA) 25# 3 x 10 Leg press (cybex) 60# 3 x 10 Manual: Seated and hooklying knee joint mobs Supine patellar mobs all directions Seated and supine knee flexion PROM  [facility] Adult PT Treatment:                                                 DATE: 03/12/23 Therapeutic Exercise: Nustep L5 x 6 mins with UE/LE while gathering subjective info Modified thomas stretch 3 x 60 sec each Supine heel slide with strap Leg press (BATCA) 25# 3 x 10 Manual: Seated and hooklying knee joint mobs Supine patellar mobs all directions Seated and supine knee PROM  [facility] Adult PT Treatment:                                                DATE: 03/06/23 Therapeutic Exercise: Nustep L5 x 6 mins with UE/LE while gathering subjective info Modified thomas stretch with passive hip extension and knee flexion 3 x 60 sec Supine quad set x 10 Bridge x 10 Supine heel slide x 10 Sit to stand 2 x 10 Standing hip flexor stretch with foot in chair 3 x 30 sec Manual: Seated and hooklying knee joint mobs Supine patellar mobs all directions Seated and supine knee PROM  [facility] Adult PT Treatment:                                                DATE: 02/28/23 Therapeutic Exercise: Nustep level 5 x 5 mins while gathering subjective info Omega knee extension 5# 2x10 with SL LLE eccentric lowering Omega knee flexion 25# 2x10 Standing hip abduction/extension 2x10 each BIL Standing heel/toe raises 2x10 Step ups 6" fwd/lat x10 ea BIL STS with OH  press orange ball x10 FT stance on Airex 2x30" 1 round with head turns/nods  PATIENT EDUCATION:  Education details: HEP Person educated: Patient Education method: Explanation, Demonstration, Tactile cues, Verbal cues Education comprehension: verbalized understanding, returned demonstration, verbal cues required, tactile cues required, and needs further education  HOME EXERCISE PROGRAM: Access Code: [email or web address]    ASSESSMENT: CLINICAL IMPRESSION: Patient tolerated therapy well with no adverse effects. Therapy focused on therex to continue stretching for the hip flexors and progressing her left knee strength. She seems to be progressing well with her strengthening exercises and did report she has been walking recently without  an increase in pain. No changes were made to her HEP this visit. Patient would benefit from continued skilled PT to progress her mobility and strength in order to reduce pain and maximize functional ability.    OBJECTIVE IMPAIRMENTS: Abnormal gait, decreased activity tolerance, decreased balance, decreased endurance, difficulty walking, decreased ROM, decreased strength, increased edema, impaired flexibility, postural dysfunction, and pain.   ACTIVITY LIMITATIONS: bending, sitting, standing, squatting, transfers, dressing, and locomotion level  PARTICIPATION LIMITATIONS: cleaning, shopping, and community activity  PERSONAL FACTORS: Fitness, Past/current experiences, and Time since onset of injury/illness/exacerbation are also affecting patient's functional outcome.    GOALS: Goals reviewed with patient? Yes  SHORT TERM GOALS: Target date: 03/14/2023  Patient will be I with initial HEP in order to progress with therapy. Baseline: HEP provided at eval 03/14/2023: independent with initial HEP Goal status: MET  2.  Patient will demonstrate left knee flexion AROM >/= 120 in order to improve transfers Baseline: 107 deg 03/12/2023: 120 deg Goal status: MET  3.  Patient will perform 5xSTS </=11 seconds in order to indicate improved LE strength and reduced fall risk Baseline: 13 seconds 03/14/2023: 11 seconds Goal status: MET  LONG TERM GOALS: Target date: 04/04/2023  Patient will be I with final HEP to maintain progress from PT. Baseline: HEP provided at eval Goal status: INITIAL  2.  Patient will report >/= 60% status on FOTO to indicate improved functional ability. Baseline: 50% Goal status: INITIAL  3.  Patient will demonstrate left knee strength 5/5 MMT to improve walking tolerance Baseline: 4/5 MMT Goal status: INITIAL  4.  Patient will improve >/= 500 ft without SPC in order to improve community access  Baseline: 288 ft Goal status: INITIAL   PLAN: PT FREQUENCY:  1-2x/week  PT DURATION: 6 weeks  PLANNED INTERVENTIONS: Therapeutic exercises, Therapeutic activity, Neuromuscular re-education, Balance training, Gait training, Patient/Family education, Self Care, Joint mobilization, Aquatic Therapy, Dry Needling, Electrical stimulation, Cryotherapy, Moist heat, Taping, Vasopneumatic device, Ionotophoresis 4mg /ml Dexamethasone, Manual therapy, and Re-evaluation  PLAN FOR NEXT SESSION: Review HEP and progress PRN, manual/stretching for left knee and hip mobility, progress left knee and hip strengthening, initiate balance training, vaso for edema management PRN   Rosana Hoes, PT, DPT, LAT, ATC 03/19/23  3:33 PM Phone: [phone] Fax: [phone]

## 2023-03-21 ENCOUNTER — Encounter: Payer: Self-pay | Admitting: Physical Therapy

## 2023-03-21 ENCOUNTER — Other Ambulatory Visit: Payer: Self-pay

## 2023-03-21 ENCOUNTER — Ambulatory Visit: Payer: Medicare Other | Admitting: Physical Therapy

## 2023-03-21 DIAGNOSIS — G8929 Other chronic pain: Secondary | ICD-10-CM

## 2023-03-21 DIAGNOSIS — R600 Localized edema: Secondary | ICD-10-CM

## 2023-03-21 DIAGNOSIS — R2689 Other abnormalities of gait and mobility: Secondary | ICD-10-CM

## 2023-03-21 DIAGNOSIS — M25552 Pain in left hip: Secondary | ICD-10-CM

## 2023-03-21 DIAGNOSIS — M25562 Pain in left knee: Secondary | ICD-10-CM | POA: Diagnosis not present

## 2023-03-21 DIAGNOSIS — M6281 Muscle weakness (generalized): Secondary | ICD-10-CM

## 2023-03-21 NOTE — Therapy (Signed)
OUTPATIENT PHYSICAL THERAPY TREATMENT NOTE   Patient Name: Haley Singh MRN: [id number] DOB:1950-12-25, 72 y.o., female Today's Date: 03/21/2023   END OF SESSION:  PT End of Session - 03/21/23 1407     Visit Number 8    Number of Visits 13    Date for PT Re-Evaluation 04/04/23    Authorization Type [employer] MDC    Progress Note Due on Visit 10    PT Start Time 1400    PT Stop Time 1445    PT Time Calculation (min) 45 min    Activity Tolerance Patient tolerated treatment well    Behavior During Therapy WFL for tasks assessed/performed                    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   Hyperlipidemia    Hypertension    followed by pcp  (11-10-2019 pt stated never had a stress test)   Insomnia    MDD (major depressive disorder)    Mild persistent asthma    followed by pcp and dr Belva Crome (pulmonologist)   RLS (restless legs syndrome)    Vertebral osteomyelitis (HCC) 02/21/2021   Past Surgical History:  Procedure Laterality Date   BUBBLE STUDY  01/25/2021   Procedure: BUBBLE STUDY;  Surgeon: Jodelle Red, MD;  Location: [facility] ENDOSCOPY;  Service: Cardiovascular;;   CATARACT EXTRACTION W/ INTRAOCULAR LENS  IMPLANT, BILATERAL  2009; 2010   CYSTOSCOPY/URETEROSCOPY/HOLMIUM LASER/STENT PLACEMENT Right 08/26/2019   Procedure: CYSTOSCOPY/RETROGRADE/URETEROSCOPY/HOLMIUM LASER/STENT PLACEMENT;  Surgeon: Rene Paci, MD;  Location: [facility];  Service: Urology;  Laterality: Right;   CYSTOSCOPY/URETEROSCOPY/HOLMIUM LASER/STENT PLACEMENT Left 11/17/2019   Procedure: CYSTOSCOPY/URETEROSCOPY, RETROGRADE,LASER LITHOTRIPSY, STENT PLACEMENT;  Surgeon: Rene Paci, MD;   Location: [facility];  Service: Urology;  Laterality: Left;   CYSTOSCOPY/URETEROSCOPY/HOLMIUM LASER/STENT PLACEMENT Left 02/19/2021   Procedure: CYSTOSCOPY/URETEROSCOPY/HOLMIUM LASER/STENT PLACEMENT;  Surgeon: Crista Elliot, MD;  Location: [facility];  Service: Urology;  Laterality: Left;   EXPLORATORY LAPAROTOMY W/ BOWEL RESECTION  01-15-2004  @[facility]    small bowel resection for stricture/ benzoar   EXTRACORPOREAL SHOCK WAVE LITHOTRIPSY Right 03/27/2018   Procedure: EXTRACORPOREAL SHOCK WAVE LITHOTRIPSY (ESWL);  Surgeon: Crist Fat, MD;  Location: [facility];  Service: Urology;  Laterality: Right;   EXTRACORPOREAL SHOCK WAVE LITHOTRIPSY  x2 2008;  2009;  2015   EXTRACORPOREAL SHOCK WAVE LITHOTRIPSY Left 11/09/2022   Procedure: LEFT EXTRACORPOREAL SHOCK WAVE LITHOTRIPSY (ESWL);  Surgeon: Rene Paci, MD;  Location: [facility];  Service: Urology;  Laterality: Left;  75 MINUTES   HOLMIUM LASER APPLICATION Left 11/17/2019   Procedure: HOLMIUM LASER APPLICATION;  Surgeon: Rene Paci, MD;  Location: [facility];  Service: Urology;  Laterality: Left;   IR FLUORO GUIDED NEEDLE PLC ASPIRATION/INJECTION LOC  02/22/2021   ORIF ANKLE FRACTURE Right 09/ 2008 @[facility]    per pt has retained hardware   TEE WITHOUT CARDIOVERSION N/A 01/25/2021   Procedure: TRANSESOPHAGEAL ECHOCARDIOGRAM (TEE);  Surgeon: Jodelle Red, MD;  Location: [facility] ENDOSCOPY;  Service: Cardiovascular;  Laterality: N/A;   TONSILLECTOMY  age 63   Patient Active Problem List   Diagnosis Date Noted  Chronic pain of right knee 07/16/2022   Groin pain, left 04/23/2022   Spinal stenosis of lumbar region 04/23/2022   Back pain of lumbar region with sciatica 04/02/2022   Arthralgia 04/02/2022   Fatigue due to excessive exertion 01/31/2022   Weight gain 01/31/2022   Penicillin allergy    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   Vertebral osteomyelitis (HCC) 02/21/2021    Intractable nausea and vomiting 02/18/2021   Hypokalemia 02/18/2021   MSSA bacteremia    Discitis of lumbar region    Osteomyelitis (HCC) 01/17/2021   Mild persistent asthma without complication 05/17/2016   OSA (obstructive sleep apnea) 03/03/2014    PCP: Macy Mis, MD  REFERRING PROVIDER: Colvin Caroli, PA-C  REFERRING DIAG: Pain in left knee  THERAPY DIAG:  Chronic pain of left knee  Pain in left hip  Muscle weakness (generalized)  Other abnormalities of gait and mobility  Localized edema  Rationale for Evaluation and Treatment: Rehabilitation  ONSET DATE: November 2023, s/p left knee arthroscopy 10/03/22 and injection 02/05/2023   SUBJECTIVE:  SUBJECTIVE STATEMENT: Patient reports she must have over-did the hip flexor stretch last visit because her left hip flexor is very flared up. She states her left knee is not very swollen today.  PERTINENT HISTORY: See PMH above  PAIN:  Are you having pain? Yes:  NPRS scale: 0/10  Pain location: Left knee Pain description: Sharp, stiff Aggravating factors: Bending the knee, walking and standing, turning a certain way Relieving factors: Lidocaine patch  PRECAUTIONS: Fall  PATIENT GOALS: Improve left knee and hip motion, improve strength and endurance of left knee and hip for walking   OBJECTIVE:  PATIENT SURVEYS:  FOTO 50% (predicted to reach 60%)  03/21/2023: 55%  EDEMA:  Not formally assessed, patient does visually demonstrate increased left knee and lower leg edema compared to the right  MUSCLE LENGTH: Limitations in bilateral hamstring, quad, and hip flexor flexibility   POSTURE:   Rounded shoulder, slight forward trunk lean, knee varus  PALPATION: Tender to palpation generalized left anterior knee and bilateral joint line  LOWER EXTREMITY ROM:  Active ROM Right eval Left eval Left 03/06/2023 Left 03/12/2023  Hip flexion      Hip extension      Hip abduction      Hip adduction      Hip  internal rotation      Hip external rotation 3 lacking 0 0   Knee flexion 125 107 110 120  Knee extension      Ankle dorsiflexion      Ankle plantarflexion      Ankle inversion      Ankle eversion       (Blank rows = not tested)  LOWER EXTREMITY MMT:  MMT Right eval Left eval  Hip flexion 3 4-  Hip extension    Hip abduction 4- 3  Hip adduction    Hip internal rotation    Hip external rotation    Knee flexion 5 4  Knee extension 5 4  Ankle dorsiflexion    Ankle plantarflexion    Ankle inversion    Ankle eversion     (Blank rows = not tested)  FUNCTIONAL TESTS:  5 times sit to stand: 13 seconds  03/14/2023: 11 seconds 2 minute walk test: 288 ft, patient carried cane for first 1:30, the required to use cane last :30 due to left knee pain SLS: unable on left  GAIT: Distance walked: 288 ft Assistive device utilized: Single point cane Level  of assistance: Modified independence Comments: Antalgic gait on left,    TODAY'S TREATMENT:    [facility] Adult PT Treatment:                                                DATE: 03/21/23 Therapeutic Exercise: Nustep L5 x 6 mins with LE to improve LE endurance and workload capacity Modified thomas stretch with MHP applied to left hip flexor region 3 x 30 sec Bridge 2 x 10 SLR 2 x 10 with left Knee extension machine DL 52# 2 x 10, SL 84# 2 x 10 Sit to stand 3 x 10   [facility] Adult PT Treatment:                                                DATE: 03/19/23 Therapeutic Exercise: Nustep L6 x 6 mins with LE to improve LE endurance and workload capacity SLR 3 x 10 with left Sidelying hip abduction 3 x 10 with left LAQ with 5# 3 x 10 Seated hamstring curl with green 3 x 10 Seated hamstring stretch 3 x 20 sec Standing hip flexor stretch with step 3 x 20 sec each Leg press (cybex) 60# 3 x 10  [facility] Adult PT Treatment:                                                DATE: 03/14/23 Therapeutic Exercise: Nustep L7 x 8 mins with LE to improve  LE endurance and workload capacity Modified thomas stretch 2 x 60 sec each LAQ with 5# 3 x 10 Leg press (BATCA) 25# 3 x 10 Leg press (cybex) 60# 3 x 10 Manual: Seated and hooklying knee joint mobs Supine patellar mobs all directions Seated and supine knee flexion PROM  [facility] Adult PT Treatment:                                                DATE: 03/12/23 Therapeutic Exercise: Nustep L5 x 6 mins with UE/LE while gathering subjective info Modified thomas stretch 3 x 60 sec each Supine heel slide with strap Leg press (BATCA) 25# 3 x 10 Manual: Seated and hooklying knee joint mobs Supine patellar mobs all directions Seated and supine knee PROM  [facility] Adult PT Treatment:                                                DATE: 03/06/23 Therapeutic Exercise: Nustep L5 x 6 mins with UE/LE while gathering subjective info Modified thomas stretch with passive hip extension and knee flexion 3 x 60 sec Supine quad set x 10 Bridge x 10 Supine heel slide x 10 Sit to stand 2 x 10 Standing hip flexor stretch with foot in chair 3 x 30 sec Manual: Seated and hooklying knee joint mobs Supine patellar mobs all directions Seated  and supine knee PROM  [facility] Adult PT Treatment:                                                DATE: 02/28/23 Therapeutic Exercise: Nustep level 5 x 5 mins while gathering subjective info Omega knee extension 5# 2x10 with SL LLE eccentric lowering Omega knee flexion 25# 2x10 Standing hip abduction/extension 2x10 each BIL Standing heel/toe raises 2x10 Step ups 6" fwd/lat x10 ea BIL STS with OH press orange ball x10 FT stance on Airex 2x30" 1 round with head turns/nods  PATIENT EDUCATION:  Education details: HEP Person educated: Patient Education method: Programmer, multimedia, Demonstration, Tactile cues, Verbal cues Education comprehension: verbalized understanding, returned demonstration, verbal cues required, tactile cues required, and needs further education  HOME EXERCISE  PROGRAM: Access Code: [email or web address]    ASSESSMENT: CLINICAL IMPRESSION: Patient tolerated therapy well with no adverse effects. She arrived reporting increased left hip flexor pain so applied MHP while stretching the hip flexor which improved her symptoms. She was able to complete strengthening exercises for the knee without an increase in pain but does report greater difficulty and muscle burn of the left quad compared to the right. She did report an improvement in her functional status this visit on FOTO. No changes made to her HEP this visit. Patient would benefit from continued skilled PT to progress her mobility and strength in order to reduce pain and maximize functional ability.    OBJECTIVE IMPAIRMENTS: Abnormal gait, decreased activity tolerance, decreased balance, decreased endurance, difficulty walking, decreased ROM, decreased strength, increased edema, impaired flexibility, postural dysfunction, and pain.   ACTIVITY LIMITATIONS: bending, sitting, standing, squatting, transfers, dressing, and locomotion level  PARTICIPATION LIMITATIONS: cleaning, shopping, and community activity  PERSONAL FACTORS: Fitness, Past/current experiences, and Time since onset of injury/illness/exacerbation are also affecting patient's functional outcome.    GOALS: Goals reviewed with patient? Yes  SHORT TERM GOALS: Target date: 03/14/2023  Patient will be I with initial HEP in order to progress with therapy. Baseline: HEP provided at eval 03/14/2023: independent with initial HEP Goal status: MET  2.  Patient will demonstrate left knee flexion AROM >/= 120 in order to improve transfers Baseline: 107 deg 03/12/2023: 120 deg Goal status: MET  3.  Patient will perform 5xSTS </=11 seconds in order to indicate improved LE strength and reduced fall risk Baseline: 13 seconds 03/14/2023: 11 seconds Goal status: MET  LONG TERM GOALS: Target date: 04/04/2023  Patient will be I with final HEP to maintain  progress from PT. Baseline: HEP provided at eval Goal status: INITIAL  2.  Patient will report >/= 60% status on FOTO to indicate improved functional ability. Baseline: 50% 03/21/2023: 55% Goal status: ONGOING  3.  Patient will demonstrate left knee strength 5/5 MMT to improve walking tolerance Baseline: 4/5 MMT Goal status: INITIAL  4.  Patient will improve >/= 500 ft without SPC in order to improve community access  Baseline: 288 ft Goal status: INITIAL   PLAN: PT FREQUENCY: 1-2x/week  PT DURATION: 6 weeks  PLANNED INTERVENTIONS: Therapeutic exercises, Therapeutic activity, Neuromuscular re-education, Balance training, Gait training, Patient/Family education, Self Care, Joint mobilization, Aquatic Therapy, Dry Needling, Electrical stimulation, Cryotherapy, Moist heat, Taping, Vasopneumatic device, Ionotophoresis 4mg /ml Dexamethasone, Manual therapy, and Re-evaluation  PLAN FOR NEXT SESSION: Review HEP and progress PRN, manual/stretching for left knee and hip mobility, progress left  knee and hip strengthening, initiate balance training, vaso for edema management PRN   Rosana Hoes, PT, DPT, LAT, ATC 03/21/23  2:49 PM Phone: [phone] Fax: [phone]

## 2023-03-27 ENCOUNTER — Ambulatory Visit: Payer: Medicare Other

## 2023-03-27 DIAGNOSIS — R2689 Other abnormalities of gait and mobility: Secondary | ICD-10-CM

## 2023-03-27 DIAGNOSIS — M25552 Pain in left hip: Secondary | ICD-10-CM

## 2023-03-27 DIAGNOSIS — G8929 Other chronic pain: Secondary | ICD-10-CM

## 2023-03-27 DIAGNOSIS — M6281 Muscle weakness (generalized): Secondary | ICD-10-CM

## 2023-03-27 DIAGNOSIS — M25562 Pain in left knee: Secondary | ICD-10-CM | POA: Diagnosis not present

## 2023-03-27 DIAGNOSIS — R600 Localized edema: Secondary | ICD-10-CM

## 2023-03-27 NOTE — Therapy (Signed)
OUTPATIENT PHYSICAL THERAPY TREATMENT NOTE   Patient Name: Haley Singh MRN: [id number] DOB:1950-09-30, 72 y.o., female Today's Date: 03/27/2023   END OF SESSION:  PT End of Session - 03/27/23 1339     Visit Number 9    Number of Visits 13    Date for PT Re-Evaluation 04/04/23    Authorization Type [employer] MDC    Progress Note Due on Visit 10    PT Start Time 1350    PT Stop Time 1430    PT Time Calculation (min) 40 min    Activity Tolerance Patient tolerated treatment well    Behavior During Therapy WFL for tasks assessed/performed             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   Hyperlipidemia    Hypertension    followed by pcp  (11-10-2019 pt stated never had a stress test)   Insomnia    MDD (major depressive disorder)    Mild persistent asthma    followed by pcp and dr Belva Crome (pulmonologist)   RLS (restless legs syndrome)    Vertebral osteomyelitis (HCC) 02/21/2021   Past Surgical History:  Procedure Laterality Date   BUBBLE STUDY  01/25/2021   Procedure: BUBBLE STUDY;  Surgeon: Jodelle Red, MD;  Location: [facility] ENDOSCOPY;  Service: Cardiovascular;;   CATARACT EXTRACTION W/ INTRAOCULAR LENS  IMPLANT, BILATERAL  2009; 2010   CYSTOSCOPY/URETEROSCOPY/HOLMIUM LASER/STENT PLACEMENT Right 08/26/2019   Procedure: CYSTOSCOPY/RETROGRADE/URETEROSCOPY/HOLMIUM LASER/STENT PLACEMENT;  Surgeon: Rene Paci, MD;  Location: [facility];  Service: Urology;  Laterality: Right;   CYSTOSCOPY/URETEROSCOPY/HOLMIUM LASER/STENT PLACEMENT Left 11/17/2019   Procedure: CYSTOSCOPY/URETEROSCOPY, RETROGRADE,LASER LITHOTRIPSY, STENT PLACEMENT;  Surgeon: Rene Paci, MD;  Location:  [facility];  Service: Urology;  Laterality: Left;   CYSTOSCOPY/URETEROSCOPY/HOLMIUM LASER/STENT PLACEMENT Left 02/19/2021   Procedure: CYSTOSCOPY/URETEROSCOPY/HOLMIUM LASER/STENT PLACEMENT;  Surgeon: Crista Elliot, MD;  Location: [facility];  Service: Urology;  Laterality: Left;   EXPLORATORY LAPAROTOMY W/ BOWEL RESECTION  01-15-2004  @[facility]    small bowel resection for stricture/ benzoar   EXTRACORPOREAL SHOCK WAVE LITHOTRIPSY Right 03/27/2018   Procedure: EXTRACORPOREAL SHOCK WAVE LITHOTRIPSY (ESWL);  Surgeon: Crist Fat, MD;  Location: [facility];  Service: Urology;  Laterality: Right;   EXTRACORPOREAL SHOCK WAVE LITHOTRIPSY  x2 2008;  2009;  2015   EXTRACORPOREAL SHOCK WAVE LITHOTRIPSY Left 11/09/2022   Procedure: LEFT EXTRACORPOREAL SHOCK WAVE LITHOTRIPSY (ESWL);  Surgeon: Rene Paci, MD;  Location: [facility];  Service: Urology;  Laterality: Left;  75 MINUTES   HOLMIUM LASER APPLICATION Left 11/17/2019   Procedure: HOLMIUM LASER APPLICATION;  Surgeon: Rene Paci, MD;  Location: [facility];  Service: Urology;  Laterality: Left;   IR FLUORO GUIDED NEEDLE PLC ASPIRATION/INJECTION LOC  02/22/2021   ORIF ANKLE FRACTURE Right 09/ 2008 @[facility]    per pt has retained hardware   TEE WITHOUT CARDIOVERSION N/A 01/25/2021   Procedure: TRANSESOPHAGEAL ECHOCARDIOGRAM (TEE);  Surgeon: Jodelle Red, MD;  Location: [facility] ENDOSCOPY;  Service: Cardiovascular;  Laterality: N/A;   TONSILLECTOMY  age 30   Patient Active Problem List   Diagnosis Date Noted   Chronic pain of right knee 07/16/2022  Groin pain, left 04/23/2022   Spinal stenosis of lumbar region 04/23/2022   Back pain of lumbar region with sciatica 04/02/2022   Arthralgia 04/02/2022   Fatigue due to excessive exertion 01/31/2022   Weight gain 01/31/2022   Penicillin allergy    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   Vertebral osteomyelitis (HCC) 02/21/2021   Intractable  nausea and vomiting 02/18/2021   Hypokalemia 02/18/2021   MSSA bacteremia    Discitis of lumbar region    Osteomyelitis (HCC) 01/17/2021   Mild persistent asthma without complication 05/17/2016   OSA (obstructive sleep apnea) 03/03/2014    PCP: Macy Mis, MD  REFERRING PROVIDER: Colvin Caroli, PA-C  REFERRING DIAG: Pain in left knee  THERAPY DIAG:  Chronic pain of left knee  Pain in left hip  Muscle weakness (generalized)  Other abnormalities of gait and mobility  Localized edema  Rationale for Evaluation and Treatment: Rehabilitation  ONSET DATE: November 2023, s/p left knee arthroscopy 10/03/22 and injection 02/05/2023   SUBJECTIVE:  SUBJECTIVE STATEMENT: Patient reports that she did some yard work yesterday and had no increase in pain from this. She is wearing lidocaine patch on anterior portion of Lt knee.   PERTINENT HISTORY: See PMH above  PAIN:  Are you having pain? Yes:  NPRS scale: 0/10  Pain location: Left knee Pain description: Sharp, stiff Aggravating factors: Bending the knee, walking and standing, turning a certain way Relieving factors: Lidocaine patch  PRECAUTIONS: Fall  PATIENT GOALS: Improve left knee and hip motion, improve strength and endurance of left knee and hip for walking   OBJECTIVE:  PATIENT SURVEYS:  FOTO 50% (predicted to reach 60%)  03/21/2023: 55%  EDEMA:  Not formally assessed, patient does visually demonstrate increased left knee and lower leg edema compared to the right  MUSCLE LENGTH: Limitations in bilateral hamstring, quad, and hip flexor flexibility   POSTURE:   Rounded shoulder, slight forward trunk lean, knee varus  PALPATION: Tender to palpation generalized left anterior knee and bilateral joint line  LOWER EXTREMITY ROM:  Active ROM Right eval Left eval Left 03/06/2023 Left 03/12/2023  Hip flexion      Hip extension      Hip abduction      Hip adduction      Hip internal rotation       Hip external rotation 3 lacking 0 0   Knee flexion 125 107 110 120  Knee extension      Ankle dorsiflexion      Ankle plantarflexion      Ankle inversion      Ankle eversion       (Blank rows = not tested)  LOWER EXTREMITY MMT:  MMT Right eval Left eval  Hip flexion 3 4-  Hip extension    Hip abduction 4- 3  Hip adduction    Hip internal rotation    Hip external rotation    Knee flexion 5 4  Knee extension 5 4  Ankle dorsiflexion    Ankle plantarflexion    Ankle inversion    Ankle eversion     (Blank rows = not tested)  FUNCTIONAL TESTS:  5 times sit to stand: 13 seconds  03/14/2023: 11 seconds 2 minute walk test: 288 ft, patient carried cane for first 1:30, the required to use cane last :30 due to left knee pain SLS: unable on left  GAIT: Distance walked: 288 ft Assistive device utilized: Single point cane Level of assistance: Modified independence Comments: Antalgic gait on left,  TODAY'S TREATMENT:    [facility] Adult PT Treatment:                                                DATE: 03/27/23 Therapeutic Exercise: Nustep L5 x 6 mins with LE to improve LE endurance and workload capacity Modified thomas stretch x1' BIL Sidelying hip abduction 2 x 10 with left Bridge 2 x 10 SLR 2 x 10 with left Knee extension machine DL 18# 2 x 10, SL Lt 84# 2 x 10 Sit to stand 3 x 10    [facility] Adult PT Treatment:                                                DATE: 03/21/23 Therapeutic Exercise: Nustep L5 x 6 mins with LE to improve LE endurance and workload capacity Modified thomas stretch with MHP applied to left hip flexor region 3 x 30 sec Bridge 2 x 10 SLR 2 x 10 with left Knee extension machine DL 16# 2 x 10, SL 60# 2 x 10 Sit to stand 3 x 10   [facility] Adult PT Treatment:                                                DATE: 03/19/23 Therapeutic Exercise: Nustep L6 x 6 mins with LE to improve LE endurance and workload capacity SLR 3 x 10 with left Sidelying hip  abduction 3 x 10 with left LAQ with 5# 3 x 10 Seated hamstring curl with green 3 x 10 Seated hamstring stretch 3 x 20 sec Standing hip flexor stretch with step 3 x 20 sec each Leg press (cybex) 60# 3 x 10     PATIENT EDUCATION:  Education details: HEP Person educated: Patient Education method: Programmer, multimedia, Demonstration, Tactile cues, Verbal cues Education comprehension: verbalized understanding, returned demonstration, verbal cues required, tactile cues required, and needs further education  HOME EXERCISE PROGRAM: Access Code: [email or web address]    ASSESSMENT: CLINICAL IMPRESSION: Patient presents to PT reporting continued anterior Lt knee pain and that she was able to work in the yard yesterday without increasing her symptoms. Session today continued to focus on LE and proximal hip strengthening and stretching. Patient continues to benefit from skilled PT services and should be progressed as able to improve functional independence.    OBJECTIVE IMPAIRMENTS: Abnormal gait, decreased activity tolerance, decreased balance, decreased endurance, difficulty walking, decreased ROM, decreased strength, increased edema, impaired flexibility, postural dysfunction, and pain.   ACTIVITY LIMITATIONS: bending, sitting, standing, squatting, transfers, dressing, and locomotion level  PARTICIPATION LIMITATIONS: cleaning, shopping, and community activity  PERSONAL FACTORS: Fitness, Past/current experiences, and Time since onset of injury/illness/exacerbation are also affecting patient's functional outcome.    GOALS: Goals reviewed with patient? Yes  SHORT TERM GOALS: Target date: 03/14/2023  Patient will be I with initial HEP in order to progress with therapy. Baseline: HEP provided at eval 03/14/2023: independent with initial HEP Goal status: MET  2.  Patient will demonstrate left knee flexion AROM >/= 120 in order to improve transfers Baseline: 107 deg 03/12/2023: 120 deg Goal status: MET  3.   Patient will perform 5xSTS </=11 seconds in order to indicate improved LE strength and reduced fall risk Baseline: 13 seconds 03/14/2023: 11 seconds Goal status: MET  LONG TERM GOALS: Target date: 04/04/2023  Patient will be I with final HEP to maintain progress from PT. Baseline: HEP provided at eval Goal status: INITIAL  2.  Patient will report >/= 60% status on FOTO to indicate improved functional ability. Baseline: 50% 03/21/2023: 55% Goal status: ONGOING  3.  Patient will demonstrate left knee strength 5/5 MMT to improve walking tolerance Baseline: 4/5 MMT Goal status: INITIAL  4.  Patient will improve >/= 500 ft without SPC in order to improve community access  Baseline: 288 ft Goal status: INITIAL   PLAN: PT FREQUENCY: 1-2x/week  PT DURATION: 6 weeks  PLANNED INTERVENTIONS: Therapeutic exercises, Therapeutic activity, Neuromuscular re-education, Balance training, Gait training, Patient/Family education, Self Care, Joint mobilization, Aquatic Therapy, Dry Needling, Electrical stimulation, Cryotherapy, Moist heat, Taping, Vasopneumatic device, Ionotophoresis 4mg /ml Dexamethasone, Manual therapy, and Re-evaluation  PLAN FOR NEXT SESSION: Review HEP and progress PRN, manual/stretching for left knee and hip mobility, progress left knee and hip strengthening, initiate balance training, vaso for edema management PRN   Berta Minor PTA 03/27/23  2:29 PM Phone: [phone] Fax: [phone]

## 2023-03-28 ENCOUNTER — Ambulatory Visit: Payer: Medicare Other

## 2023-03-28 DIAGNOSIS — M25552 Pain in left hip: Secondary | ICD-10-CM

## 2023-03-28 DIAGNOSIS — R2689 Other abnormalities of gait and mobility: Secondary | ICD-10-CM

## 2023-03-28 DIAGNOSIS — M6281 Muscle weakness (generalized): Secondary | ICD-10-CM

## 2023-03-28 DIAGNOSIS — M25562 Pain in left knee: Secondary | ICD-10-CM | POA: Diagnosis not present

## 2023-03-28 DIAGNOSIS — G8929 Other chronic pain: Secondary | ICD-10-CM

## 2023-03-28 DIAGNOSIS — R600 Localized edema: Secondary | ICD-10-CM

## 2023-03-28 NOTE — Therapy (Signed)
OUTPATIENT PHYSICAL THERAPY TREATMENT NOTE   Patient Name: Haley Singh MRN: [id number] DOB:10/14/1950, 72 y.o., female Today's Date: 03/28/2023   END OF SESSION:  PT End of Session - 03/28/23 1156     Visit Number 10    Number of Visits 13    Date for PT Re-Evaluation 04/04/23    Authorization Type [employer] MDC    Progress Note Due on Visit 10    PT Start Time 1210    PT Stop Time 1248    PT Time Calculation (min) 38 min    Activity Tolerance Patient tolerated treatment well    Behavior During Therapy WFL for tasks assessed/performed              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   Hyperlipidemia    Hypertension    followed by pcp  (11-10-2019 pt stated never had a stress test)   Insomnia    MDD (major depressive disorder)    Mild persistent asthma    followed by pcp and dr Belva Crome (pulmonologist)   RLS (restless legs syndrome)    Vertebral osteomyelitis (HCC) 02/21/2021   Past Surgical History:  Procedure Laterality Date   BUBBLE STUDY  01/25/2021   Procedure: BUBBLE STUDY;  Surgeon: Jodelle Red, MD;  Location: [facility] ENDOSCOPY;  Service: Cardiovascular;;   CATARACT EXTRACTION W/ INTRAOCULAR LENS  IMPLANT, BILATERAL  2009; 2010   CYSTOSCOPY/URETEROSCOPY/HOLMIUM LASER/STENT PLACEMENT Right 08/26/2019   Procedure: CYSTOSCOPY/RETROGRADE/URETEROSCOPY/HOLMIUM LASER/STENT PLACEMENT;  Surgeon: Rene Paci, MD;  Location: [facility];  Service: Urology;  Laterality: Right;   CYSTOSCOPY/URETEROSCOPY/HOLMIUM LASER/STENT PLACEMENT Left 11/17/2019   Procedure: CYSTOSCOPY/URETEROSCOPY, RETROGRADE,LASER LITHOTRIPSY, STENT PLACEMENT;  Surgeon: Rene Paci, MD;  Location:  [facility];  Service: Urology;  Laterality: Left;   CYSTOSCOPY/URETEROSCOPY/HOLMIUM LASER/STENT PLACEMENT Left 02/19/2021   Procedure: CYSTOSCOPY/URETEROSCOPY/HOLMIUM LASER/STENT PLACEMENT;  Surgeon: Crista Elliot, MD;  Location: [facility];  Service: Urology;  Laterality: Left;   EXPLORATORY LAPAROTOMY W/ BOWEL RESECTION  01-15-2004  @[facility]    small bowel resection for stricture/ benzoar   EXTRACORPOREAL SHOCK WAVE LITHOTRIPSY Right 03/27/2018   Procedure: EXTRACORPOREAL SHOCK WAVE LITHOTRIPSY (ESWL);  Surgeon: Crist Fat, MD;  Location: [facility];  Service: Urology;  Laterality: Right;   EXTRACORPOREAL SHOCK WAVE LITHOTRIPSY  x2 2008;  2009;  2015   EXTRACORPOREAL SHOCK WAVE LITHOTRIPSY Left 11/09/2022   Procedure: LEFT EXTRACORPOREAL SHOCK WAVE LITHOTRIPSY (ESWL);  Surgeon: Rene Paci, MD;  Location: [facility];  Service: Urology;  Laterality: Left;  75 MINUTES   HOLMIUM LASER APPLICATION Left 11/17/2019   Procedure: HOLMIUM LASER APPLICATION;  Surgeon: Rene Paci, MD;  Location: [facility];  Service: Urology;  Laterality: Left;   IR FLUORO GUIDED NEEDLE PLC ASPIRATION/INJECTION LOC  02/22/2021   ORIF ANKLE FRACTURE Right 09/ 2008 @[facility]    per pt has retained hardware   TEE WITHOUT CARDIOVERSION N/A 01/25/2021   Procedure: TRANSESOPHAGEAL ECHOCARDIOGRAM (TEE);  Surgeon: Jodelle Red, MD;  Location: [facility] ENDOSCOPY;  Service: Cardiovascular;  Laterality: N/A;   TONSILLECTOMY  age 72   Patient Active Problem List   Diagnosis Date Noted   Chronic pain of right knee  07/16/2022   Groin pain, left 04/23/2022   Spinal stenosis of lumbar region 04/23/2022   Back pain of lumbar region with sciatica 04/02/2022   Arthralgia 04/02/2022   Fatigue due to excessive exertion 01/31/2022   Weight gain 01/31/2022   Penicillin allergy    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   Vertebral osteomyelitis (HCC) 02/21/2021   Intractable  nausea and vomiting 02/18/2021   Hypokalemia 02/18/2021   MSSA bacteremia    Discitis of lumbar region    Osteomyelitis (HCC) 01/17/2021   Mild persistent asthma without complication 05/17/2016   OSA (obstructive sleep apnea) 03/03/2014    PCP: Macy Mis, MD  REFERRING PROVIDER: Colvin Caroli, PA-C  REFERRING DIAG: Pain in left knee  THERAPY DIAG:  Chronic pain of left knee  Pain in left hip  Muscle weakness (generalized)  Other abnormalities of gait and mobility  Localized edema  Rationale for Evaluation and Treatment: Rehabilitation  ONSET DATE: November 2023, s/p left knee arthroscopy 10/03/22 and injection 02/05/2023   SUBJECTIVE:  SUBJECTIVE STATEMENT: Patient reports some soreness and stiffness from yesterday's session, continues to endorse pain in anterior portion of Lt knee.   PERTINENT HISTORY: See PMH above  PAIN:  Are you having pain? Yes:  NPRS scale: 0/10  Pain location: Left knee Pain description: Sharp, stiff Aggravating factors: Bending the knee, walking and standing, turning a certain way Relieving factors: Lidocaine patch  PRECAUTIONS: Fall  PATIENT GOALS: Improve left knee and hip motion, improve strength and endurance of left knee and hip for walking   OBJECTIVE:  PATIENT SURVEYS:  FOTO 50% (predicted to reach 60%)  03/21/2023: 55%  EDEMA:  Not formally assessed, patient does visually demonstrate increased left knee and lower leg edema compared to the right  MUSCLE LENGTH: Limitations in bilateral hamstring, quad, and hip flexor flexibility   POSTURE:   Rounded shoulder, slight forward trunk lean, knee varus  PALPATION: Tender to palpation generalized left anterior knee and bilateral joint line  LOWER EXTREMITY ROM:  Active ROM Right eval Left eval Left 03/06/2023 Left 03/12/2023  Hip flexion      Hip extension      Hip abduction      Hip adduction      Hip internal rotation      Hip external rotation 3  lacking 0 0   Knee flexion 125 107 110 120  Knee extension      Ankle dorsiflexion      Ankle plantarflexion      Ankle inversion      Ankle eversion       (Blank rows = not tested)  LOWER EXTREMITY MMT:  MMT Right eval Left eval  Hip flexion 3 4-  Hip extension    Hip abduction 4- 3  Hip adduction    Hip internal rotation    Hip external rotation    Knee flexion 5 4  Knee extension 5 4  Ankle dorsiflexion    Ankle plantarflexion    Ankle inversion    Ankle eversion     (Blank rows = not tested)  FUNCTIONAL TESTS:  5 times sit to stand: 13 seconds  03/14/2023: 11 seconds 2 minute walk test: 288 ft, patient carried cane for first 1:30, the required to use cane last :30 due to left knee pain SLS: unable on left  GAIT: Distance walked: 288 ft Assistive device utilized: Single point cane Level of assistance: Modified independence Comments: Antalgic gait on left,    TODAY'S TREATMENT:  [facility] Adult PT Treatment:                                                DATE: 03/28/23 Therapeutic Exercise: Nustep L5 x 8 mins with LE to improve LE endurance and workload capacity Modified thomas stretch x1' BIL Sidelying hip abduction 2 x 10 with left Bridge 2 x 10 Supine hip adduction ball squeeze 5" hold 2x10 SLR 2 x 10 with left Knee extension machine DL 62# 2 x 10 Sit to stand 2 x 10   [facility] Adult PT Treatment:                                                DATE: 03/27/23 Therapeutic Exercise: Nustep L5 x 6 mins with LE to improve LE endurance and workload capacity Modified thomas stretch x1' BIL Sidelying hip abduction 2 x 10 with left Bridge 2 x 10 SLR 2 x 10 with left Knee extension machine DL 70# 2 x 10, SL Lt 35# 2 x 10 Sit to stand 3 x 10    [facility] Adult PT Treatment:                                                DATE: 03/21/23 Therapeutic Exercise: Nustep L5 x 6 mins with LE to improve LE endurance and workload capacity Modified thomas stretch with MHP  applied to left hip flexor region 3 x 30 sec Bridge 2 x 10 SLR 2 x 10 with left Knee extension machine DL 00# 2 x 10, SL 93# 2 x 10 Sit to stand 3 x 10    PATIENT EDUCATION:  Education details: HEP Person educated: Patient Education method: Programmer, multimedia, Demonstration, Actor cues, Verbal cues Education comprehension: verbalized understanding, returned demonstration, verbal cues required, tactile cues required, and needs further education  HOME EXERCISE PROGRAM: Access Code: [email or web address]    ASSESSMENT: CLINICAL IMPRESSION: Patient presents to PT reporting increased stiffness and soreness from yesterday's session. Session today continued to focus on LE strengthening with slight regression today to accommodate back to back sessions. Patient was able to tolerate all prescribed exercises with no adverse effects. Patient continues to benefit from skilled PT services and should be progressed as able to improve functional independence.    OBJECTIVE IMPAIRMENTS: Abnormal gait, decreased activity tolerance, decreased balance, decreased endurance, difficulty walking, decreased ROM, decreased strength, increased edema, impaired flexibility, postural dysfunction, and pain.   ACTIVITY LIMITATIONS: bending, sitting, standing, squatting, transfers, dressing, and locomotion level  PARTICIPATION LIMITATIONS: cleaning, shopping, and community activity  PERSONAL FACTORS: Fitness, Past/current experiences, and Time since onset of injury/illness/exacerbation are also affecting patient's functional outcome.    GOALS: Goals reviewed with patient? Yes  SHORT TERM GOALS: Target date: 03/14/2023  Patient will be I with initial HEP in order to progress with therapy. Baseline: HEP provided at eval 03/14/2023: independent with initial HEP Goal status: MET  2.  Patient will demonstrate left knee flexion AROM >/= 120 in order to improve transfers Baseline: 107 deg 03/12/2023: 120 deg Goal status: MET  3.   Patient will perform 5xSTS </=11 seconds  in order to indicate improved LE strength and reduced fall risk Baseline: 13 seconds 03/14/2023: 11 seconds Goal status: MET  LONG TERM GOALS: Target date: 04/04/2023  Patient will be I with final HEP to maintain progress from PT. Baseline: HEP provided at eval Goal status: INITIAL  2.  Patient will report >/= 60% status on FOTO to indicate improved functional ability. Baseline: 50% 03/21/2023: 55% Goal status: ONGOING  3.  Patient will demonstrate left knee strength 5/5 MMT to improve walking tolerance Baseline: 4/5 MMT Goal status: INITIAL  4.  Patient will improve >/= 500 ft without SPC in order to improve community access  Baseline: 288 ft Goal status: INITIAL   PLAN: PT FREQUENCY: 1-2x/week  PT DURATION: 6 weeks  PLANNED INTERVENTIONS: Therapeutic exercises, Therapeutic activity, Neuromuscular re-education, Balance training, Gait training, Patient/Family education, Self Care, Joint mobilization, Aquatic Therapy, Dry Needling, Electrical stimulation, Cryotherapy, Moist heat, Taping, Vasopneumatic device, Ionotophoresis 4mg /ml Dexamethasone, Manual therapy, and Re-evaluation  PLAN FOR NEXT SESSION: Review HEP and progress PRN, manual/stretching for left knee and hip mobility, progress left knee and hip strengthening, initiate balance training, vaso for edema management PRN   Berta Minor PTA 03/28/23  12:55 PM Phone: [phone] Fax: [phone]

## 2023-04-02 ENCOUNTER — Other Ambulatory Visit: Payer: Self-pay

## 2023-04-02 ENCOUNTER — Encounter: Payer: Self-pay | Admitting: Physical Therapy

## 2023-04-02 ENCOUNTER — Ambulatory Visit: Payer: Medicare Other | Admitting: Physical Therapy

## 2023-04-02 DIAGNOSIS — M6281 Muscle weakness (generalized): Secondary | ICD-10-CM

## 2023-04-02 DIAGNOSIS — R2689 Other abnormalities of gait and mobility: Secondary | ICD-10-CM

## 2023-04-02 DIAGNOSIS — G8929 Other chronic pain: Secondary | ICD-10-CM

## 2023-04-02 DIAGNOSIS — M25562 Pain in left knee: Secondary | ICD-10-CM | POA: Diagnosis not present

## 2023-04-02 DIAGNOSIS — R600 Localized edema: Secondary | ICD-10-CM

## 2023-04-02 DIAGNOSIS — M25552 Pain in left hip: Secondary | ICD-10-CM

## 2023-04-02 NOTE — Therapy (Signed)
OUTPATIENT PHYSICAL THERAPY TREATMENT NOTE   Patient Name: Haley Singh MRN: [id number] DOB:1950/06/27, 72 y.o., female Today's Date: 04/02/2023   END OF SESSION:  PT End of Session - 04/02/23 1250     Visit Number 11    Number of Visits 13    Date for PT Re-Evaluation 04/04/23    Authorization Type UHC MCR    Authorization Time Period 02/22/2023 - 04/05/2023    Authorization - Visit Number 10    Authorization - Number of Visits 12    Progress Note Due on Visit 21    PT Start Time 1310    PT Stop Time 1350    PT Time Calculation (min) 40 min    Activity Tolerance Patient tolerated treatment well    Behavior During Therapy WFL for tasks assessed/performed               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   Hyperlipidemia    Hypertension    followed by pcp  (11-10-2019 pt stated never had a stress test)   Insomnia    MDD (major depressive disorder)    Mild persistent asthma    followed by pcp and dr Belva Crome (pulmonologist)   RLS (restless legs syndrome)    Vertebral osteomyelitis (HCC) 02/21/2021   Past Surgical History:  Procedure Laterality Date   BUBBLE STUDY  01/25/2021   Procedure: BUBBLE STUDY;  Surgeon: Jodelle Red, MD;  Location: [facility] ENDOSCOPY;  Service: Cardiovascular;;   CATARACT EXTRACTION W/ INTRAOCULAR LENS  IMPLANT, BILATERAL  2009; 2010   CYSTOSCOPY/URETEROSCOPY/HOLMIUM LASER/STENT PLACEMENT Right 08/26/2019   Procedure: CYSTOSCOPY/RETROGRADE/URETEROSCOPY/HOLMIUM LASER/STENT PLACEMENT;  Surgeon: Rene Paci, MD;  Location: [facility];  Service: Urology;  Laterality: Right;   CYSTOSCOPY/URETEROSCOPY/HOLMIUM LASER/STENT PLACEMENT Left 11/17/2019    Procedure: CYSTOSCOPY/URETEROSCOPY, RETROGRADE,LASER LITHOTRIPSY, STENT PLACEMENT;  Surgeon: Rene Paci, MD;  Location: [facility];  Service: Urology;  Laterality: Left;   CYSTOSCOPY/URETEROSCOPY/HOLMIUM LASER/STENT PLACEMENT Left 02/19/2021   Procedure: CYSTOSCOPY/URETEROSCOPY/HOLMIUM LASER/STENT PLACEMENT;  Surgeon: Crista Elliot, MD;  Location: [facility];  Service: Urology;  Laterality: Left;   EXPLORATORY LAPAROTOMY W/ BOWEL RESECTION  01-15-2004  @[facility]    small bowel resection for stricture/ benzoar   EXTRACORPOREAL SHOCK WAVE LITHOTRIPSY Right 03/27/2018   Procedure: EXTRACORPOREAL SHOCK WAVE LITHOTRIPSY (ESWL);  Surgeon: Crist Fat, MD;  Location: [facility];  Service: Urology;  Laterality: Right;   EXTRACORPOREAL SHOCK WAVE LITHOTRIPSY  x2 2008;  2009;  2015   EXTRACORPOREAL SHOCK WAVE LITHOTRIPSY Left 11/09/2022   Procedure: LEFT EXTRACORPOREAL SHOCK WAVE LITHOTRIPSY (ESWL);  Surgeon: Rene Paci, MD;  Location: [facility];  Service: Urology;  Laterality: Left;  75 MINUTES   HOLMIUM LASER APPLICATION Left 11/17/2019   Procedure: HOLMIUM LASER APPLICATION;  Surgeon: Rene Paci, MD;  Location: [facility];  Service: Urology;  Laterality: Left;   IR FLUORO GUIDED NEEDLE PLC ASPIRATION/INJECTION LOC  02/22/2021   ORIF ANKLE FRACTURE Right 09/ 2008 @[facility]    per pt has retained hardware   TEE WITHOUT CARDIOVERSION N/A 01/25/2021   Procedure: TRANSESOPHAGEAL ECHOCARDIOGRAM (TEE);  Surgeon: Jodelle Red, MD;  Location: [facility] ENDOSCOPY;  Service: Cardiovascular;  Laterality: N/A;   TONSILLECTOMY  age 3   Patient Active Problem List   Diagnosis Date Noted   Chronic pain of right knee 07/16/2022   Groin pain, left 04/23/2022   Spinal stenosis of lumbar region 04/23/2022   Back pain of lumbar region with sciatica 04/02/2022   Arthralgia 04/02/2022   Fatigue due to excessive exertion 01/31/2022    Weight gain 01/31/2022   Penicillin allergy    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   Vertebral osteomyelitis (HCC) 02/21/2021   Intractable nausea and vomiting 02/18/2021   Hypokalemia 02/18/2021   MSSA bacteremia    Discitis of lumbar region    Osteomyelitis (HCC) 01/17/2021   Mild persistent asthma without complication 05/17/2016   OSA (obstructive sleep apnea) 03/03/2014    PCP: Macy Mis, MD  REFERRING PROVIDER: Colvin Caroli, PA-C  REFERRING DIAG: Pain in left knee  THERAPY DIAG:  Chronic pain of left knee  Pain in left hip  Muscle weakness (generalized)  Other abnormalities of gait and mobility  Localized edema  Rationale for Evaluation and Treatment: Rehabilitation  ONSET DATE: November 2023, s/p left knee arthroscopy 10/03/22 and injection 02/05/2023   SUBJECTIVE:  SUBJECTIVE STATEMENT: Patient reports her left knee was hurting her this morning but is feeling fine now. She still gets stiffness of the left knee.   PERTINENT HISTORY: See PMH above  PAIN:  Are you having pain? Yes:  NPRS scale: 0/10  Pain location: Left knee Pain description: Sharp, stiff Aggravating factors: Bending the knee, walking and standing, turning a certain way Relieving factors: Lidocaine patch  PRECAUTIONS: Fall  PATIENT GOALS: Improve left knee and hip motion, improve strength and endurance of left knee and hip for walking   OBJECTIVE:  PATIENT SURVEYS:  FOTO 50% (predicted to reach 60%)  03/21/2023: 55%  EDEMA:  Not formally assessed, patient does visually demonstrate increased left knee and lower leg edema compared to the right  MUSCLE LENGTH: Limitations in bilateral hamstring, quad, and hip flexor flexibility   POSTURE:   Rounded shoulder, slight forward trunk lean, knee varus  PALPATION: Tender to palpation generalized left anterior knee and bilateral joint line  LOWER EXTREMITY ROM:  Active ROM Right eval Left eval Left 03/06/2023 Left 03/12/2023   Hip flexion      Hip extension      Hip abduction      Hip adduction      Hip internal rotation      Hip external rotation 3 lacking 0 0   Knee flexion 125 107 110 120  Knee extension      Ankle dorsiflexion      Ankle plantarflexion      Ankle inversion      Ankle eversion       (Blank rows = not tested)  LOWER EXTREMITY MMT:  MMT Right eval Left eval Rt / Lt 04/02/2023  Hip flexion 3 4-   Hip extension     Hip abduction 4- 3 4- / 4-  Hip adduction     Hip internal rotation     Hip external rotation     Knee flexion 5 4 5  / 4+  Knee extension 5 4 5  / 4+  Ankle dorsiflexion     Ankle plantarflexion     Ankle inversion     Ankle eversion      (Blank rows = not tested)  FUNCTIONAL TESTS:  5 times sit to stand: 13 seconds  03/14/2023: 11 seconds 2 minute walk test: 288  ft, patient carried cane for first 1:30, the required to use cane last :30 due to left knee pain SLS: unable on left  GAIT: Distance walked: 288 ft Assistive device utilized: Single point cane Level of assistance: Modified independence Comments: Antalgic gait on left,    TODAY'S TREATMENT:    [facility] Adult PT Treatment:                                                DATE: 04/02/23 Therapeutic Exercise: Nustep L5 x 6 mins with LE to improve LE endurance and workload capacity Modified thomas stretch 2 x 1 min each Bridge 2 x 10 Side clamshell with green 3 x 15 each Leg press (cybex) 70# 3 x 10  Forward 8" step-up 2 x 10 each Manual: Contract relax hip flexor stretching in modified thomas position   [facility] Adult PT Treatment:                                                DATE: 03/28/23 Therapeutic Exercise: Nustep L5 x 8 mins with LE to improve LE endurance and workload capacity Modified thomas stretch x1' BIL Sidelying hip abduction 2 x 10 with left Bridge 2 x 10 Supine hip adduction ball squeeze 5" hold 2x10 SLR 2 x 10 with left Knee extension machine DL 40# 2 x 10 Sit to stand 2 x  10  [facility] Adult PT Treatment:                                                DATE: 03/27/23 Therapeutic Exercise: Nustep L5 x 6 mins with LE to improve LE endurance and workload capacity Modified thomas stretch x1' BIL Sidelying hip abduction 2 x 10 with left Bridge 2 x 10 SLR 2 x 10 with left Knee extension machine DL 98# 2 x 10, SL Lt 11# 2 x 10 Sit to stand 3 x 10  [facility] Adult PT Treatment:                                                DATE: 03/21/23 Therapeutic Exercise: Nustep L5 x 6 mins with LE to improve LE endurance and workload capacity Modified thomas stretch with MHP applied to left hip flexor region 3 x 30 sec Bridge 2 x 10 SLR 2 x 10 with left Knee extension machine DL 91# 2 x 10, SL 47# 2 x 10 Sit to stand 3 x 10  PATIENT EDUCATION:  Education details: HEP Person educated: Patient Education method: Programmer, multimedia, Demonstration, Actor cues, Verbal cues Education comprehension: verbalized understanding, returned demonstration, verbal cues required, tactile cues required, and needs further education  HOME EXERCISE PROGRAM: Access Code: [email or web address]    ASSESSMENT: CLINICAL IMPRESSION: Patient tolerated therapy well with no adverse effects. Therapy focused on stretching for bilateral hip flexors and progressing LE strengthening with good tolerance. She does demonstrate improvement in left hip and knee strength this visit. She is progressing well with her strengthening  exercises in therapy and reports improvement in her walking. She does continue to note stiffness of the left knee and hip. No changes made to her HEP this visit. Will perform reassessment at next visit. Patient would benefit from continued skilled PT to progress her mobility and strength in order to reduce pain and maximize functional ability.    OBJECTIVE IMPAIRMENTS: Abnormal gait, decreased activity tolerance, decreased balance, decreased endurance, difficulty walking, decreased ROM, decreased strength,  increased edema, impaired flexibility, postural dysfunction, and pain.   ACTIVITY LIMITATIONS: bending, sitting, standing, squatting, transfers, dressing, and locomotion level  PARTICIPATION LIMITATIONS: cleaning, shopping, and community activity  PERSONAL FACTORS: Fitness, Past/current experiences, and Time since onset of injury/illness/exacerbation are also affecting patient's functional outcome.    GOALS: Goals reviewed with patient? Yes  SHORT TERM GOALS: Target date: 03/14/2023  Patient will be I with initial HEP in order to progress with therapy. Baseline: HEP provided at eval 03/14/2023: independent with initial HEP Goal status: MET  2.  Patient will demonstrate left knee flexion AROM >/= 120 in order to improve transfers Baseline: 107 deg 03/12/2023: 120 deg Goal status: MET  3.  Patient will perform 5xSTS </=11 seconds in order to indicate improved LE strength and reduced fall risk Baseline: 13 seconds 03/14/2023: 11 seconds Goal status: MET  LONG TERM GOALS: Target date: 04/04/2023  Patient will be I with final HEP to maintain progress from PT. Baseline: HEP provided at eval Goal status: INITIAL  2.  Patient will report >/= 60% status on FOTO to indicate improved functional ability. Baseline: 50% 03/21/2023: 55% Goal status: ONGOING  3.  Patient will demonstrate left knee strength 5/5 MMT to improve walking tolerance Baseline: 4/5 MMT Goal status: INITIAL  4.  Patient will improve >/= 500 ft without SPC in order to improve community access  Baseline: 288 ft Goal status: INITIAL   PLAN: PT FREQUENCY: 1-2x/week  PT DURATION: 6 weeks  PLANNED INTERVENTIONS: Therapeutic exercises, Therapeutic activity, Neuromuscular re-education, Balance training, Gait training, Patient/Family education, Self Care, Joint mobilization, Aquatic Therapy, Dry Needling, Electrical stimulation, Cryotherapy, Moist heat, Taping, Vasopneumatic device, Ionotophoresis 4mg /ml  Dexamethasone, Manual therapy, and Re-evaluation  PLAN FOR NEXT SESSION: Review HEP and progress PRN, manual/stretching for left knee and hip mobility, progress left knee and hip strengthening, initiate balance training, vaso for edema management PRN   Hilbert Bible PT 04/02/23  1:59 PM Phone: [phone] Fax: [phone]

## 2023-04-04 ENCOUNTER — Ambulatory Visit: Payer: Medicare Other | Admitting: Physical Therapy

## 2023-04-04 ENCOUNTER — Encounter: Payer: Self-pay | Admitting: Physical Therapy

## 2023-04-04 ENCOUNTER — Other Ambulatory Visit: Payer: Self-pay

## 2023-04-04 DIAGNOSIS — R600 Localized edema: Secondary | ICD-10-CM

## 2023-04-04 DIAGNOSIS — R2689 Other abnormalities of gait and mobility: Secondary | ICD-10-CM

## 2023-04-04 DIAGNOSIS — M25562 Pain in left knee: Secondary | ICD-10-CM | POA: Diagnosis not present

## 2023-04-04 DIAGNOSIS — G8929 Other chronic pain: Secondary | ICD-10-CM

## 2023-04-04 DIAGNOSIS — M6281 Muscle weakness (generalized): Secondary | ICD-10-CM

## 2023-04-04 DIAGNOSIS — M25552 Pain in left hip: Secondary | ICD-10-CM

## 2023-04-09 ENCOUNTER — Ambulatory Visit: Payer: Medicare Other | Attending: Physician Assistant | Admitting: Physical Therapy

## 2023-04-09 ENCOUNTER — Other Ambulatory Visit: Payer: Self-pay

## 2023-04-09 ENCOUNTER — Encounter: Payer: Self-pay | Admitting: Physical Therapy

## 2023-04-09 DIAGNOSIS — R600 Localized edema: Secondary | ICD-10-CM | POA: Insufficient documentation

## 2023-04-09 DIAGNOSIS — M25562 Pain in left knee: Secondary | ICD-10-CM | POA: Insufficient documentation

## 2023-04-09 DIAGNOSIS — R2689 Other abnormalities of gait and mobility: Secondary | ICD-10-CM | POA: Insufficient documentation

## 2023-04-09 DIAGNOSIS — G8929 Other chronic pain: Secondary | ICD-10-CM | POA: Diagnosis present

## 2023-04-09 DIAGNOSIS — M25552 Pain in left hip: Secondary | ICD-10-CM | POA: Diagnosis present

## 2023-04-09 DIAGNOSIS — M6281 Muscle weakness (generalized): Secondary | ICD-10-CM | POA: Insufficient documentation

## 2023-04-09 NOTE — Therapy (Signed)
OUTPATIENT PHYSICAL THERAPY TREATMENT NOTE   Patient Name: Haley Singh MRN: [id number] DOB:12-13-1950, 72 y.o., female Today's Date: 04/09/2023   END OF SESSION:  PT End of Session - 04/09/23 1247     Visit Number 13    Number of Visits 16    Date for PT Re-Evaluation 05/02/23    Authorization Type [employer] MCR    Authorization Time Period auth pending    PT Start Time 1310    PT Stop Time 1355    PT Time Calculation (min) 45 min    Activity Tolerance Patient tolerated treatment well    Behavior During Therapy WFL for tasks assessed/performed                 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   Hyperlipidemia    Hypertension    followed by pcp  (11-10-2019 pt stated never had a stress test)   Insomnia    MDD (major depressive disorder)    Mild persistent asthma    followed by pcp and dr Belva Crome (pulmonologist)   RLS (restless legs syndrome)    Vertebral osteomyelitis (HCC) 02/21/2021   Past Surgical History:  Procedure Laterality Date   BUBBLE STUDY  01/25/2021   Procedure: BUBBLE STUDY;  Surgeon: Jodelle Red, MD;  Location: [facility] ENDOSCOPY;  Service: Cardiovascular;;   CATARACT EXTRACTION W/ INTRAOCULAR LENS  IMPLANT, BILATERAL  2009; 2010   CYSTOSCOPY/URETEROSCOPY/HOLMIUM LASER/STENT PLACEMENT Right 08/26/2019   Procedure: CYSTOSCOPY/RETROGRADE/URETEROSCOPY/HOLMIUM LASER/STENT PLACEMENT;  Surgeon: Rene Paci, MD;  Location: [facility];  Service: Urology;  Laterality: Right;   CYSTOSCOPY/URETEROSCOPY/HOLMIUM LASER/STENT PLACEMENT Left 11/17/2019   Procedure: CYSTOSCOPY/URETEROSCOPY, RETROGRADE,LASER LITHOTRIPSY, STENT PLACEMENT;  Surgeon: Rene Paci, MD;   Location: [facility];  Service: Urology;  Laterality: Left;   CYSTOSCOPY/URETEROSCOPY/HOLMIUM LASER/STENT PLACEMENT Left 02/19/2021   Procedure: CYSTOSCOPY/URETEROSCOPY/HOLMIUM LASER/STENT PLACEMENT;  Surgeon: Crista Elliot, MD;  Location: [facility];  Service: Urology;  Laterality: Left;   EXPLORATORY LAPAROTOMY W/ BOWEL RESECTION  01-15-2004  @[facility]    small bowel resection for stricture/ benzoar   EXTRACORPOREAL SHOCK WAVE LITHOTRIPSY Right 03/27/2018   Procedure: EXTRACORPOREAL SHOCK WAVE LITHOTRIPSY (ESWL);  Surgeon: Crist Fat, MD;  Location: [facility];  Service: Urology;  Laterality: Right;   EXTRACORPOREAL SHOCK WAVE LITHOTRIPSY  x2 2008;  2009;  2015   EXTRACORPOREAL SHOCK WAVE LITHOTRIPSY Left 11/09/2022   Procedure: LEFT EXTRACORPOREAL SHOCK WAVE LITHOTRIPSY (ESWL);  Surgeon: Rene Paci, MD;  Location: [facility];  Service: Urology;  Laterality: Left;  75 MINUTES   HOLMIUM LASER APPLICATION Left 11/17/2019   Procedure: HOLMIUM LASER APPLICATION;  Surgeon: Rene Paci, MD;  Location: [facility];  Service: Urology;  Laterality: Left;   IR FLUORO GUIDED NEEDLE PLC ASPIRATION/INJECTION LOC  02/22/2021   ORIF ANKLE FRACTURE Right 09/ 2008 @[facility]    per pt has retained hardware   TEE WITHOUT CARDIOVERSION N/A 01/25/2021   Procedure: TRANSESOPHAGEAL ECHOCARDIOGRAM (TEE);  Surgeon: Jodelle Red, MD;  Location: [facility] ENDOSCOPY;  Service: Cardiovascular;  Laterality: N/A;   TONSILLECTOMY  age 13   Patient Active Problem List   Diagnosis Date Noted   Chronic pain of  right knee 07/16/2022   Groin pain, left 04/23/2022   Spinal stenosis of lumbar region 04/23/2022   Back pain of lumbar region with sciatica 04/02/2022   Arthralgia 04/02/2022   Fatigue due to excessive exertion 01/31/2022   Weight gain 01/31/2022   Penicillin allergy    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   Vertebral osteomyelitis (HCC) 02/21/2021    Intractable nausea and vomiting 02/18/2021   Hypokalemia 02/18/2021   MSSA bacteremia    Discitis of lumbar region    Osteomyelitis (HCC) 01/17/2021   Mild persistent asthma without complication 05/17/2016   OSA (obstructive sleep apnea) 03/03/2014    PCP: Macy Mis, MD  REFERRING PROVIDER: Colvin Caroli, PA-C  REFERRING DIAG: Pain in left knee  THERAPY DIAG:  Chronic pain of left knee  Pain in left hip  Muscle weakness (generalized)  Other abnormalities of gait and mobility  Localized edema  Rationale for Evaluation and Treatment: Rehabilitation  ONSET DATE: November 2023, s/p left knee arthroscopy 10/03/22 and injection 02/05/2023   SUBJECTIVE:  SUBJECTIVE STATEMENT: Patient reports she is tired and stiff from gardening early today.  PERTINENT HISTORY: See PMH above  PAIN:  Are you having pain? Yes:  NPRS scale: 0/10  Pain location: Left knee Pain description: Sharp, stiff Aggravating factors: Bending the knee, walking and standing, turning a certain way Relieving factors: Lidocaine patch  PRECAUTIONS: Fall  PATIENT GOALS: Improve left knee and hip motion, improve strength and endurance of left knee and hip for walking   OBJECTIVE:  PATIENT SURVEYS:  FOTO 50% (predicted to reach 60%)  03/21/2023: 55%  04/04/2023: 56%  EDEMA:  Not formally assessed, patient does visually demonstrate increased left knee and lower leg edema compared to the right  MUSCLE LENGTH: Limitations in bilateral hamstring, quad, and hip flexor flexibility   POSTURE:   Rounded shoulder, slight forward trunk lean, knee varus  PALPATION: Tender to palpation generalized left anterior knee and bilateral joint line  LOWER EXTREMITY ROM:  Active ROM Right eval Left eval Left 03/06/2023 Left 03/12/2023  Hip flexion      Hip extension      Hip abduction      Hip adduction      Hip internal rotation      Hip external rotation 3 lacking 0 0   Knee flexion 125 107 110  120  Knee extension      Ankle dorsiflexion      Ankle plantarflexion      Ankle inversion      Ankle eversion       (Blank rows = not tested)  LOWER EXTREMITY MMT:  MMT Right eval Left eval Rt / Lt 04/02/2023 Lt 04/04/2023 Lt 04/09/2023  Hip flexion 3 4-     Hip extension       Hip abduction 4- 3 4- / 4-    Hip adduction       Hip internal rotation       Hip external rotation       Knee flexion 5 4 5  / 4+ 4+   Knee extension 5 4 5  / 4+ 4+ 4+  Ankle dorsiflexion       Ankle plantarflexion       Ankle inversion       Ankle eversion        (Blank rows = not tested)  FUNCTIONAL TESTS:  5 times sit to stand: 13 seconds  03/14/2023: 11 seconds 2 minute walk test: 288 ft, patient carried cane for first  1:30, the required to use cane last :30 due to left knee pain  04/04/2023: 320 ft without needing to use the cane, 1 short rest break SLS: unable on left  GAIT: Distance walked: 288 ft Assistive device utilized: Single point cane Level of assistance: Modified independence Comments: Antalgic gait on left   TODAY'S TREATMENT:    [facility] Adult PT Treatment:                                                DATE: 04/09/23 Therapeutic Exercise: Nustep L7 x 6 mins with LE to improve LE endurance and workload capacity Side clamshell with blue 3 x 15 each Leg press (cybex) DL 52# 3 x 10, SL 84# 3 x 6 SLS 5 x 15 sec each Knee extension machine DL 13# 2 x 15, SL 24# 2 x 10 Standing heel raises 2 x 20   [facility] Adult PT Treatment:                                                DATE: 04/04/23 Therapeutic Exercise: Nustep L5 x 6 mins with LE to improve LE endurance and workload capacity Leg press (cybex) DL 40# 3 x 10, SL 10# 2 x 10 Knee extension machine DL 27# 3 x 10, SL 25# 2 x 10 Side stepping at counter with green at knees 2 x 30 steps down/back Therapeutic Activity: Reassessment of LTGs for POC update consisting of FOTO, , MMT  [facility] Adult PT Treatment:                                                 DATE: 04/02/23 Therapeutic Exercise: Nustep L5 x 6 mins with LE to improve LE endurance and workload capacity Modified thomas stretch 2 x 1 min each Bridge 2 x 10 Side clamshell with green 3 x 15 each Leg press (cybex) 70# 3 x 10  Forward 8" step-up 2 x 10 each Manual: Contract relax hip flexor stretching in modified thomas position  [facility] Adult PT Treatment:                                                DATE: 03/28/23 Therapeutic Exercise: Nustep L5 x 8 mins with LE to improve LE endurance and workload capacity Modified thomas stretch x1' BIL Sidelying hip abduction 2 x 10 with left Bridge 2 x 10 Supine hip adduction ball squeeze 5" hold 2x10 SLR 2 x 10 with left Knee extension machine DL 36# 2 x 10 Sit to stand 2 x 10  PATIENT EDUCATION:  Education details: HEP Person educated: Patient Education method: Programmer, multimedia, Demonstration, Actor cues, Verbal cues Education comprehension: verbalized understanding, returned demonstration, verbal cues required, tactile cues required, and needs further education  HOME EXERCISE PROGRAM: Access Code: [email or web address]    ASSESSMENT: CLINICAL IMPRESSION: Patient tolerated therapy well with no adverse effects. Therapy continues to focus on progressing LE strengthening with good tolerance. Patient is progressing nicely with  her strengthening exercises and tolerating increased reps/resistance. She does continued to exhibit strength deficit of the left knee compared to the right. No changes made to HEP this visit. Patient would benefit from continued skilled PT to progress her mobility and strength in order to reduce pain and maximize functional ability.   OBJECTIVE IMPAIRMENTS: Abnormal gait, decreased activity tolerance, decreased balance, decreased endurance, difficulty walking, decreased ROM, decreased strength, increased edema, impaired flexibility, postural dysfunction, and pain.   ACTIVITY LIMITATIONS: bending, sitting,  standing, squatting, transfers, dressing, and locomotion level  PARTICIPATION LIMITATIONS: cleaning, shopping, and community activity  PERSONAL FACTORS: Fitness, Past/current experiences, and Time since onset of injury/illness/exacerbation are also affecting patient's functional outcome.    GOALS: Goals reviewed with patient? Yes  SHORT TERM GOALS: Target date: 03/14/2023  Patient will be I with initial HEP in order to progress with therapy. Baseline: HEP provided at eval 03/14/2023: independent with initial HEP Goal status: MET  2.  Patient will demonstrate left knee flexion AROM >/= 120 in order to improve transfers Baseline: 107 deg 03/12/2023: 120 deg Goal status: MET  3.  Patient will perform 5xSTS </=11 seconds in order to indicate improved LE strength and reduced fall risk Baseline: 13 seconds 03/14/2023: 11 seconds Goal status: MET  LONG TERM GOALS: Target date: 05/02/2023  Patient will be I with final HEP to maintain progress from PT. Baseline: HEP provided at eval 04/04/2023: progressing Goal status: ONGOING  2.  Patient will report >/= 60% status on FOTO to indicate improved functional ability. Baseline: 50% 03/21/2023: 55% 04/04/2023: 56% Goal status: ONGOING  3.  Patient will demonstrate left knee strength 5/5 MMT to improve walking tolerance Baseline: 4/5 MMT 04/04/2023: 4+/5 MMT Goal status: ONGOING  4.  Patient will improve >/= 500 ft without SPC in order to improve community access  Baseline: 288 ft 04/04/2023: 320 ft without needing to use SPC Goal status: ONGOING   PLAN: PT FREQUENCY: 1x/week  PT DURATION: 4 weeks  PLANNED INTERVENTIONS: Therapeutic exercises, Therapeutic activity, Neuromuscular re-education, Balance training, Gait training, Patient/Family education, Self Care, Joint mobilization, Aquatic Therapy, Dry Needling, Electrical stimulation, Cryotherapy, Moist heat, Taping, Vasopneumatic device, Ionotophoresis 4mg /ml  Dexamethasone, Manual therapy, and Re-evaluation  PLAN FOR NEXT SESSION: Review HEP and progress PRN, manual/stretching for left knee and hip mobility, progress left knee and hip strengthening, initiate balance training, vaso for edema management PRN   Hilbert Bible PT 04/09/23  1:58 PM Phone: [phone] Fax: [phone]

## 2023-04-16 ENCOUNTER — Ambulatory Visit: Payer: Medicare Other | Admitting: Physical Therapy

## 2023-04-16 ENCOUNTER — Other Ambulatory Visit: Payer: Self-pay

## 2023-04-16 ENCOUNTER — Encounter: Payer: Self-pay | Admitting: Physical Therapy

## 2023-04-16 DIAGNOSIS — R600 Localized edema: Secondary | ICD-10-CM

## 2023-04-16 DIAGNOSIS — M6281 Muscle weakness (generalized): Secondary | ICD-10-CM

## 2023-04-16 DIAGNOSIS — G8929 Other chronic pain: Secondary | ICD-10-CM

## 2023-04-16 DIAGNOSIS — M25562 Pain in left knee: Secondary | ICD-10-CM | POA: Diagnosis not present

## 2023-04-16 DIAGNOSIS — M25552 Pain in left hip: Secondary | ICD-10-CM

## 2023-04-16 DIAGNOSIS — R2689 Other abnormalities of gait and mobility: Secondary | ICD-10-CM

## 2023-04-16 NOTE — Therapy (Signed)
OUTPATIENT PHYSICAL THERAPY TREATMENT NOTE   Patient Name: Haley Singh MRN: [id number] DOB:19-Jun-1950, 72 y.o., female Today's Date: 04/16/2023   END OF SESSION:  PT End of Session - 04/16/23 1314     Visit Number 14    Number of Visits 16    Date for PT Re-Evaluation 05/02/23    Authorization Type UHC MCR    Authorization Time Period 04/09/2023 - 05/07/2023    Authorization - Visit Number 2    Authorization - Number of Visits 4    Progress Note Due on Visit 21    PT Start Time 1315    PT Stop Time 1355    PT Time Calculation (min) 40 min    Activity Tolerance Patient tolerated treatment well    Behavior During Therapy WFL for tasks assessed/performed                  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   Hyperlipidemia    Hypertension    followed by pcp  (11-10-2019 pt stated never had a stress test)   Insomnia    MDD (major depressive disorder)    Mild persistent asthma    followed by pcp and dr Belva Crome (pulmonologist)   RLS (restless legs syndrome)    Vertebral osteomyelitis (HCC) 02/21/2021   Past Surgical History:  Procedure Laterality Date   BUBBLE STUDY  01/25/2021   Procedure: BUBBLE STUDY;  Surgeon: Jodelle Red, MD;  Location: [facility] ENDOSCOPY;  Service: Cardiovascular;;   CATARACT EXTRACTION W/ INTRAOCULAR LENS  IMPLANT, BILATERAL  2009; 2010   CYSTOSCOPY/URETEROSCOPY/HOLMIUM LASER/STENT PLACEMENT Right 08/26/2019   Procedure: CYSTOSCOPY/RETROGRADE/URETEROSCOPY/HOLMIUM LASER/STENT PLACEMENT;  Surgeon: Rene Paci, MD;  Location: [facility];  Service: Urology;  Laterality: Right;   CYSTOSCOPY/URETEROSCOPY/HOLMIUM LASER/STENT PLACEMENT Left 11/17/2019    Procedure: CYSTOSCOPY/URETEROSCOPY, RETROGRADE,LASER LITHOTRIPSY, STENT PLACEMENT;  Surgeon: Rene Paci, MD;  Location: [facility];  Service: Urology;  Laterality: Left;   CYSTOSCOPY/URETEROSCOPY/HOLMIUM LASER/STENT PLACEMENT Left 02/19/2021   Procedure: CYSTOSCOPY/URETEROSCOPY/HOLMIUM LASER/STENT PLACEMENT;  Surgeon: Crista Elliot, MD;  Location: [facility];  Service: Urology;  Laterality: Left;   EXPLORATORY LAPAROTOMY W/ BOWEL RESECTION  01-15-2004  @[facility]    small bowel resection for stricture/ benzoar   EXTRACORPOREAL SHOCK WAVE LITHOTRIPSY Right 03/27/2018   Procedure: EXTRACORPOREAL SHOCK WAVE LITHOTRIPSY (ESWL);  Surgeon: Crist Fat, MD;  Location: [facility];  Service: Urology;  Laterality: Right;   EXTRACORPOREAL SHOCK WAVE LITHOTRIPSY  x2 2008;  2009;  2015   EXTRACORPOREAL SHOCK WAVE LITHOTRIPSY Left 11/09/2022   Procedure: LEFT EXTRACORPOREAL SHOCK WAVE LITHOTRIPSY (ESWL);  Surgeon: Rene Paci, MD;  Location: [facility];  Service: Urology;  Laterality: Left;  75 MINUTES   HOLMIUM LASER APPLICATION Left 11/17/2019   Procedure: HOLMIUM LASER APPLICATION;  Surgeon: Rene Paci, MD;  Location: [facility];  Service: Urology;  Laterality: Left;   IR FLUORO GUIDED NEEDLE PLC ASPIRATION/INJECTION LOC  02/22/2021   ORIF ANKLE FRACTURE Right 09/ 2008 @[facility]    per pt has retained hardware   TEE WITHOUT CARDIOVERSION N/A 01/25/2021   Procedure: TRANSESOPHAGEAL ECHOCARDIOGRAM (TEE);  Surgeon: Jodelle Red, MD;  Location: [facility] ENDOSCOPY;  Service: Cardiovascular;  Laterality: N/A;   TONSILLECTOMY  age 63   Patient Active Problem List   Diagnosis Date Noted   Chronic pain of right knee 07/16/2022   Groin pain, left 04/23/2022   Spinal stenosis of lumbar region 04/23/2022   Back pain of lumbar region with sciatica 04/02/2022   Arthralgia 04/02/2022   Fatigue due to excessive exertion 01/31/2022    Weight gain 01/31/2022   Penicillin allergy    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   Vertebral osteomyelitis (HCC) 02/21/2021   Intractable nausea and vomiting 02/18/2021   Hypokalemia 02/18/2021   MSSA bacteremia    Discitis of lumbar region    Osteomyelitis (HCC) 01/17/2021   Mild persistent asthma without complication 05/17/2016   OSA (obstructive sleep apnea) 03/03/2014    PCP: Macy Mis, MD  REFERRING PROVIDER: Colvin Caroli, PA-C  REFERRING DIAG: Pain in left knee  THERAPY DIAG:  Chronic pain of left knee  Pain in left hip  Muscle weakness (generalized)  Other abnormalities of gait and mobility  Localized edema  Rationale for Evaluation and Treatment: Rehabilitation  ONSET DATE: November 2023, s/p left knee arthroscopy 10/03/22 and injection 02/05/2023   SUBJECTIVE:  SUBJECTIVE STATEMENT: Patient reports her left knee has been giving her fits. States that her knee usually hurts at night but this morning is started to bother her.   PERTINENT HISTORY: See PMH above  PAIN:  Are you having pain? Yes:  NPRS scale: 2/10  Pain location: Left knee Pain description: Sharp, stiff Aggravating factors: Bending the knee, walking and standing, turning a certain way Relieving factors: Lidocaine patch  PRECAUTIONS: Fall  PATIENT GOALS: Improve left knee and hip motion, improve strength and endurance of left knee and hip for walking   OBJECTIVE:  PATIENT SURVEYS:  FOTO 50% (predicted to reach 60%)  03/21/2023: 55%  04/04/2023: 56%  EDEMA:  Not formally assessed, patient does visually demonstrate increased left knee and lower leg edema compared to the right  MUSCLE LENGTH: Limitations in bilateral hamstring, quad, and hip flexor flexibility   POSTURE:   Rounded shoulder, slight forward trunk lean, knee varus  PALPATION: Tender to palpation generalized left anterior knee and bilateral joint line  LOWER EXTREMITY ROM:  Active ROM Right eval Left eval  Left 03/06/2023 Left 03/12/2023  Hip flexion      Hip extension      Hip abduction      Hip adduction      Hip internal rotation      Hip external rotation 3 lacking 0 0   Knee flexion 125 107 110 120  Knee extension      Ankle dorsiflexion      Ankle plantarflexion      Ankle inversion      Ankle eversion       (Blank rows = not tested)  LOWER EXTREMITY MMT:  MMT Right eval Left eval Rt / Lt 04/02/2023 Lt 04/04/2023 Lt 04/09/2023  Hip flexion 3 4-     Hip extension       Hip abduction 4- 3 4- / 4-    Hip adduction       Hip internal rotation       Hip external rotation       Knee flexion 5 4 5  / 4+ 4+   Knee extension 5 4 5  / 4+ 4+ 4+  Ankle dorsiflexion       Ankle plantarflexion       Ankle inversion  Ankle eversion        (Blank rows = not tested)  FUNCTIONAL TESTS:  5 times sit to stand: 13 seconds  03/14/2023: 11 seconds 2 minute walk test: 288 ft, patient carried cane for first 1:30, the required to use cane last :30 due to left knee pain  04/04/2023: 320 ft without needing to use the cane, 1 short rest break SLS: unable on left  GAIT: Distance walked: 288 ft Assistive device utilized: Single point cane Level of assistance: Modified independence Comments: Antalgic gait on left   TODAY'S TREATMENT:    [facility] Adult PT Treatment:                                                DATE: 04/16/23 Therapeutic Exercise: Nustep L7 x 6 mins with LE to improve LE endurance and workload capacity Bridge 2 x 10 LAQ with 5# 3 x 10 with 3 sec hold Sidelying hip abduction 3 x 15 Sit to stand 2 x 10 Manual: Seated and supine knee mobs for improved mobility and pain relief Modified thomas stretch with passive hip extension 3 x 30 sec Passive supine ITB stretch 3 x 30 sec   [facility] Adult PT Treatment:                                                DATE: 04/09/23 Therapeutic Exercise: Nustep L7 x 6 mins with LE to improve LE endurance and workload capacity Side  clamshell with blue 3 x 15 each Leg press (cybex) DL 28# 3 x 10, SL 41# 3 x 6 SLS 5 x 15 sec each Knee extension machine DL 32# 2 x 15, SL 44# 2 x 10 Standing heel raises 2 x 20  [facility] Adult PT Treatment:                                                DATE: 04/04/23 Therapeutic Exercise: Nustep L5 x 6 mins with LE to improve LE endurance and workload capacity Leg press (cybex) DL 01# 3 x 10, SL 02# 2 x 10 Knee extension machine DL 72# 3 x 10, SL 53# 2 x 10 Side stepping at counter with green at knees 2 x 30 steps down/back Therapeutic Activity: Reassessment of LTGs for POC update consisting of FOTO, , MMT  [facility] Adult PT Treatment:                                                DATE: 04/02/23 Therapeutic Exercise: Nustep L5 x 6 mins with LE to improve LE endurance and workload capacity Modified thomas stretch 2 x 1 min each Bridge 2 x 10 Side clamshell with green 3 x 15 each Leg press (cybex) 70# 3 x 10  Forward 8" step-up 2 x 10 each Manual: Contract relax hip flexor stretching in modified thomas position  PATIENT EDUCATION:  Education details: HEP Person educated: Patient Education method: Programmer, multimedia, Facilities manager, Actor cues, Verbal cues Education  comprehension: verbalized understanding, returned demonstration, verbal cues required, tactile cues required, and needs further education  HOME EXERCISE PROGRAM: Access Code: [email or web address]    ASSESSMENT: CLINICAL IMPRESSION: Patient tolerated therapy well with no adverse effects. She arrived reporting increased left lateral knee pain that seems to be located mainly to the lateral joint line. Therapy focused on reducing knee pain, improving mobility, and progressing left knee and hip strength. She did report an improvement in her knee pain following therapy. No changes made to HEP this visit. Patient would benefit from continued skilled PT to progress her mobility and strength in order to reduce pain and maximize functional  ability.   OBJECTIVE IMPAIRMENTS: Abnormal gait, decreased activity tolerance, decreased balance, decreased endurance, difficulty walking, decreased ROM, decreased strength, increased edema, impaired flexibility, postural dysfunction, and pain.   ACTIVITY LIMITATIONS: bending, sitting, standing, squatting, transfers, dressing, and locomotion level  PARTICIPATION LIMITATIONS: cleaning, shopping, and community activity  PERSONAL FACTORS: Fitness, Past/current experiences, and Time since onset of injury/illness/exacerbation are also affecting patient's functional outcome.    GOALS: Goals reviewed with patient? Yes  SHORT TERM GOALS: Target date: 03/14/2023  Patient will be I with initial HEP in order to progress with therapy. Baseline: HEP provided at eval 03/14/2023: independent with initial HEP Goal status: MET  2.  Patient will demonstrate left knee flexion AROM >/= 120 in order to improve transfers Baseline: 107 deg 03/12/2023: 120 deg Goal status: MET  3.  Patient will perform 5xSTS </=11 seconds in order to indicate improved LE strength and reduced fall risk Baseline: 13 seconds 03/14/2023: 11 seconds Goal status: MET  LONG TERM GOALS: Target date: 05/02/2023  Patient will be I with final HEP to maintain progress from PT. Baseline: HEP provided at eval 04/04/2023: progressing Goal status: ONGOING  2.  Patient will report >/= 60% status on FOTO to indicate improved functional ability. Baseline: 50% 03/21/2023: 55% 04/04/2023: 56% Goal status: ONGOING  3.  Patient will demonstrate left knee strength 5/5 MMT to improve walking tolerance Baseline: 4/5 MMT 04/04/2023: 4+/5 MMT Goal status: ONGOING  4.  Patient will improve >/= 500 ft without SPC in order to improve community access  Baseline: 288 ft 04/04/2023: 320 ft without needing to use SPC Goal status: ONGOING   PLAN: PT FREQUENCY: 1x/week  PT DURATION: 4 weeks  PLANNED INTERVENTIONS: Therapeutic  exercises, Therapeutic activity, Neuromuscular re-education, Balance training, Gait training, Patient/Family education, Self Care, Joint mobilization, Aquatic Therapy, Dry Needling, Electrical stimulation, Cryotherapy, Moist heat, Taping, Vasopneumatic device, Ionotophoresis 4mg /ml Dexamethasone, Manual therapy, and Re-evaluation  PLAN FOR NEXT SESSION: Review HEP and progress PRN, manual/stretching for left knee and hip mobility, progress left knee and hip strengthening, initiate balance training, vaso for edema management PRN   Hilbert Bible PT 04/16/23  2:04 PM Phone: [phone] Fax: [phone]

## 2023-04-23 ENCOUNTER — Ambulatory Visit: Payer: Medicare Other | Admitting: Physical Therapy

## 2023-04-23 ENCOUNTER — Encounter: Payer: Medicare Other | Admitting: Physical Therapy

## 2023-04-30 ENCOUNTER — Encounter: Payer: Self-pay | Admitting: Physical Therapy

## 2023-04-30 ENCOUNTER — Ambulatory Visit: Payer: Medicare Other | Admitting: Physical Therapy

## 2023-04-30 ENCOUNTER — Other Ambulatory Visit: Payer: Self-pay

## 2023-04-30 DIAGNOSIS — M25552 Pain in left hip: Secondary | ICD-10-CM

## 2023-04-30 DIAGNOSIS — M25562 Pain in left knee: Secondary | ICD-10-CM | POA: Diagnosis not present

## 2023-04-30 DIAGNOSIS — M6281 Muscle weakness (generalized): Secondary | ICD-10-CM

## 2023-04-30 DIAGNOSIS — R2689 Other abnormalities of gait and mobility: Secondary | ICD-10-CM

## 2023-04-30 DIAGNOSIS — R600 Localized edema: Secondary | ICD-10-CM

## 2023-04-30 DIAGNOSIS — G8929 Other chronic pain: Secondary | ICD-10-CM

## 2023-04-30 IMAGING — CT CT HEAD W/O CM
3 series · 16 of 47 positions shown, 19 images · non-contrast
Comparison: None.

CLINICAL DATA: 70-year-old female with headache.

EXAM:
CT HEAD WITHOUT CONTRAST
TECHNIQUE: Contiguous axial images were obtained from the base of the skull
through the vertex without intravenous contrast.

[Series 2: head wo · axial · 0.47mm/px · z∈[-152,-17]mm · 10 of 33 slices shown, 13 images]
[im 3/33  brain]
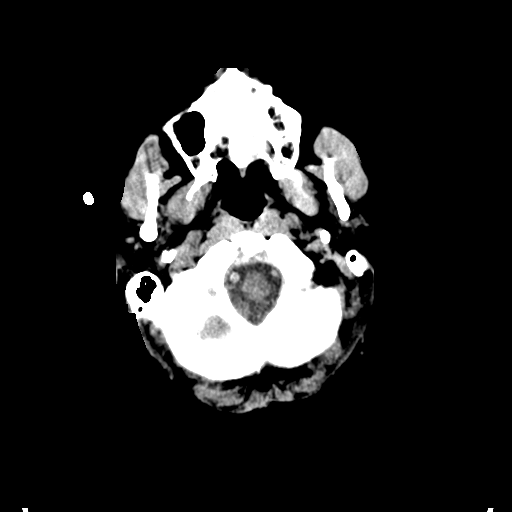
[im 3/33  bone]
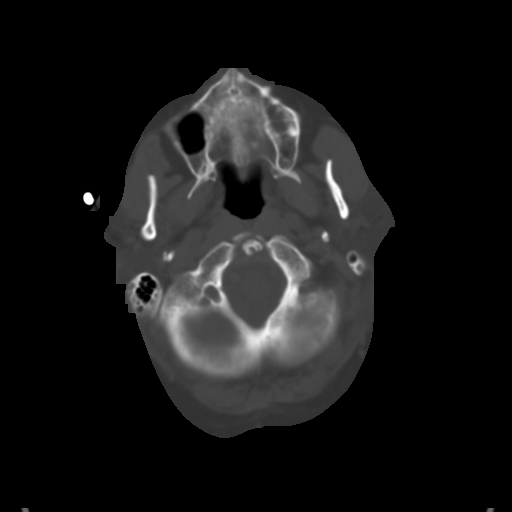
[im 6/33  brain]
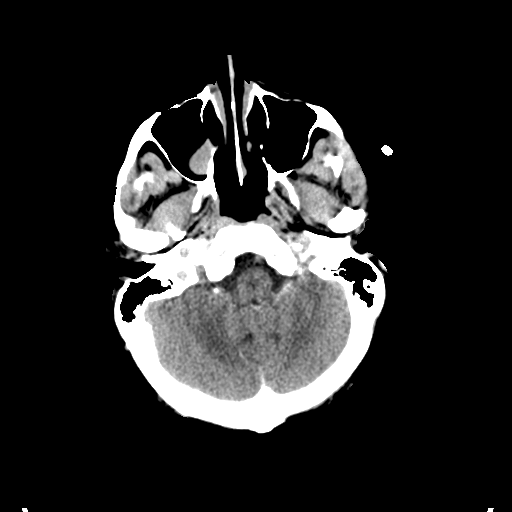
[im 9/33  brain]
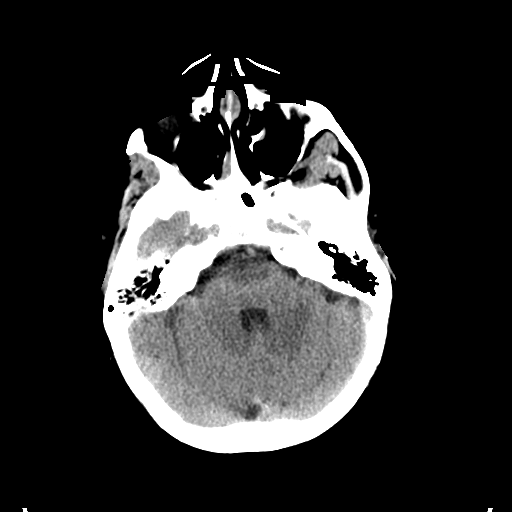
[im 12/33  brain]
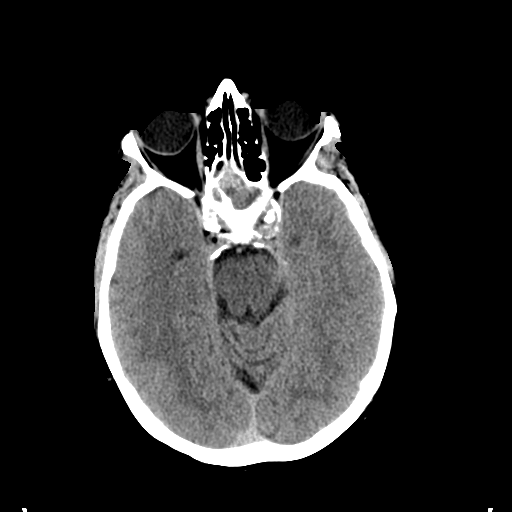
[im 15/33  brain]
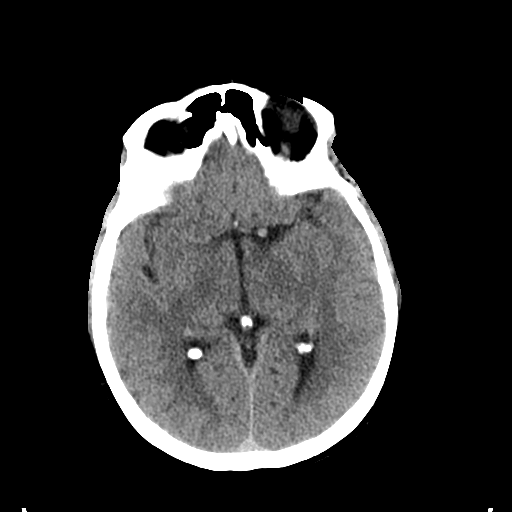
[im 15/33  bone]
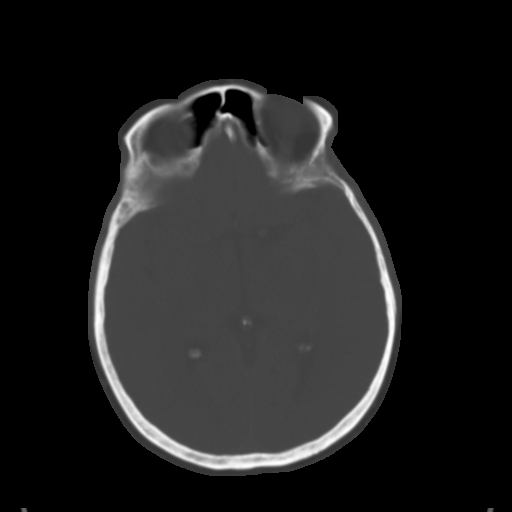
[im 18/33  brain]
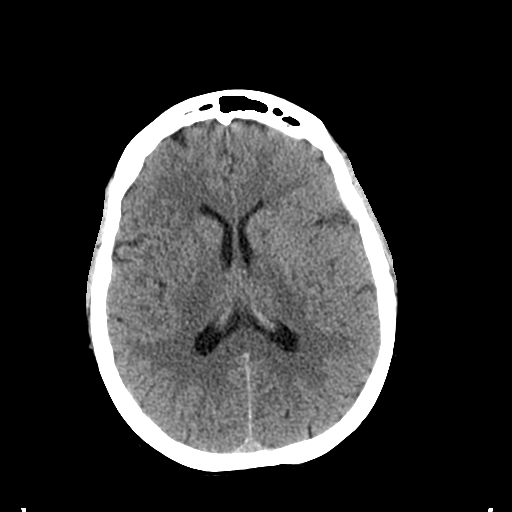
[im 21/33  brain]
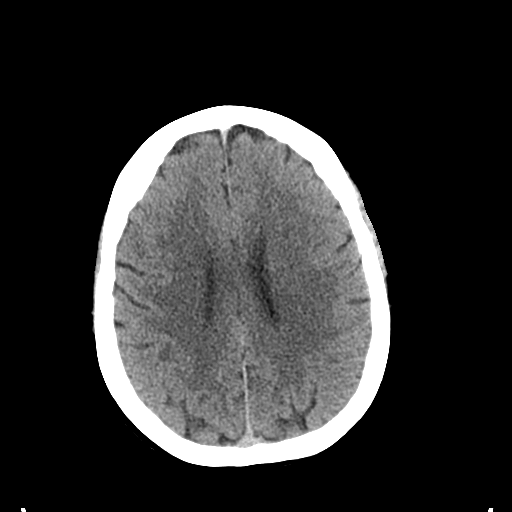
[im 25/33  brain]
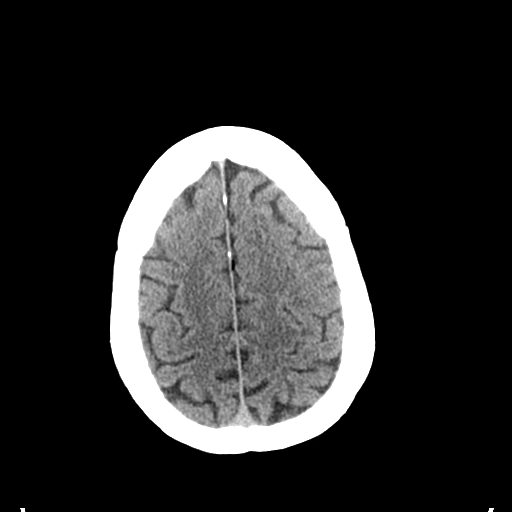
[im 27/33  brain]
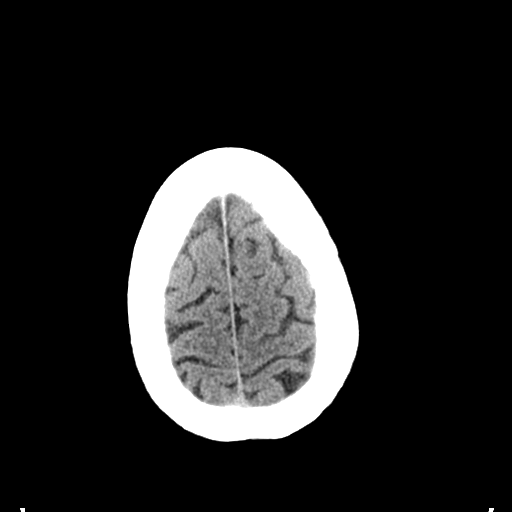
[im 27/33  bone]
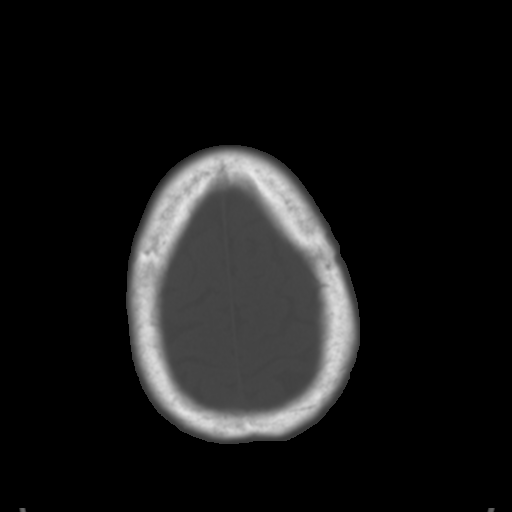
[im 30/33  brain]
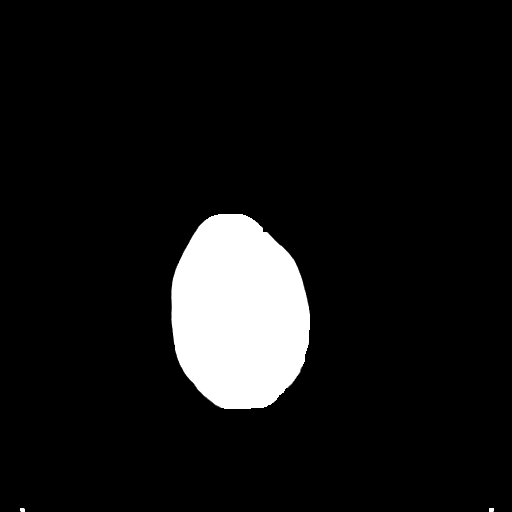

[Series 4: coronal soft tissue · coronal · 0.31mm/px · 3 of 64 slices shown]
[im 22/64  brain]
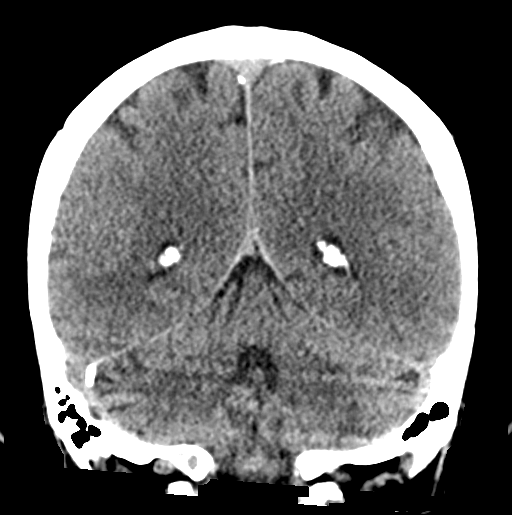
[im 29/64  brain]
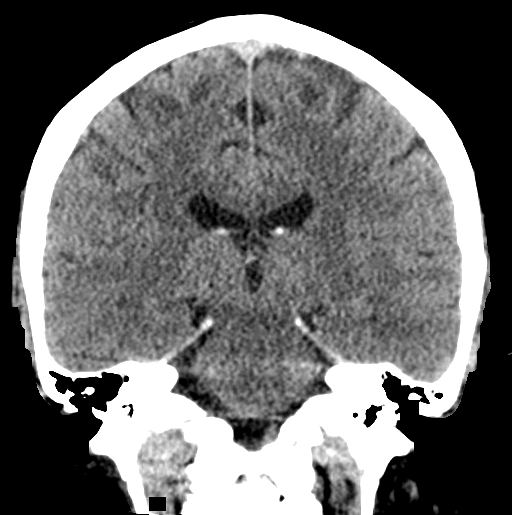
[im 35/64  brain]
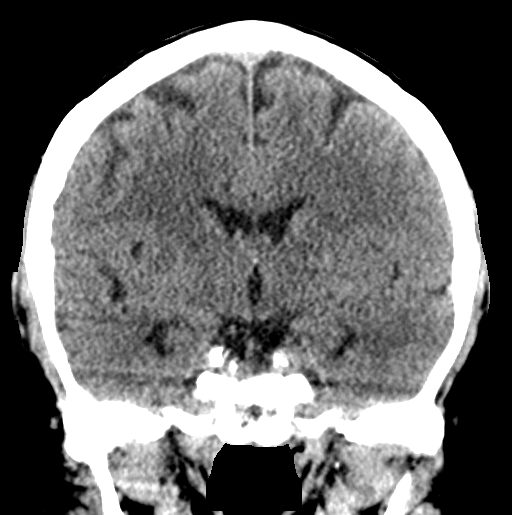

[Series 5: sagittal soft tissue · sagittal · 0.32mm/px · 3 of 49 slices shown]
[im 17/49  brain]
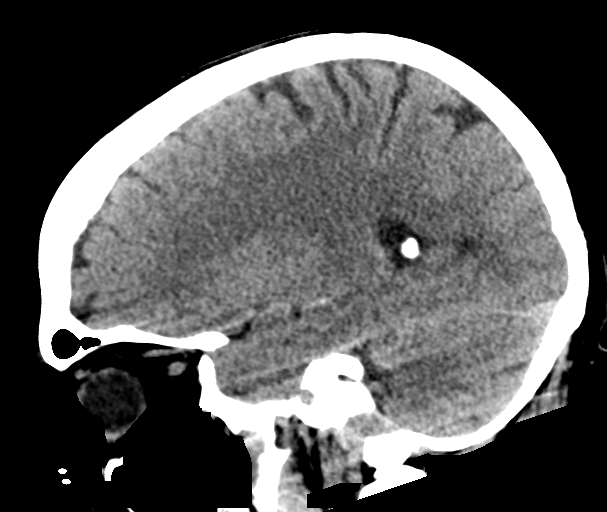
[im 25/49  brain]
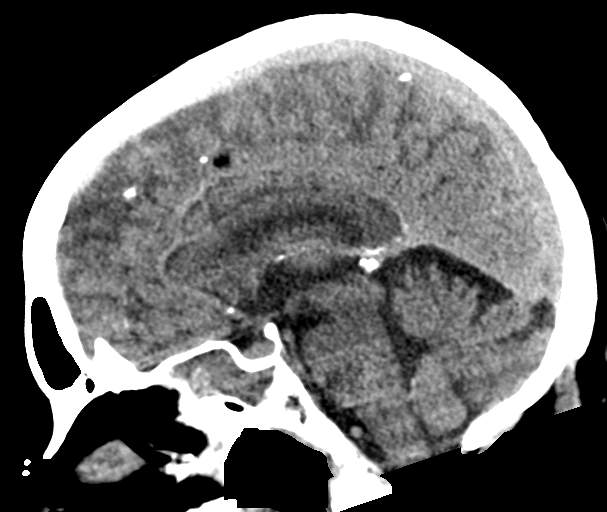
[im 33/49  brain]
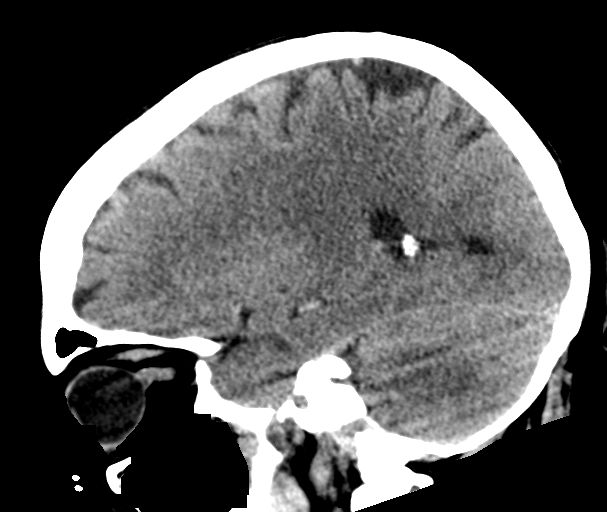

[16 of 47 positions shown; findings below may reference images not displayed]

FINDINGS: Brain: The ventricles and sulci are appropriate size for patient's
age. The gray-white matter discrimination is preserved. There is no
acute intracranial hemorrhage. No mass effect midline shift. No
extra-axial fluid collection.

Vascular: No hyperdense vessel or unexpected calcification.

Skull: Normal. Negative for fracture or focal lesion.

Sinuses/Orbits: There is complete opacification of the sphenoid
sinuses with remodeling of the bone. No destructive changes or
expansion. There is opacification of several ethmoid air cells. No
air-fluid level. There is a 2 cm right maxillary sinus retention
cyst or polyp. The mastoid air cells are clear.

Other: None
IMPRESSION: 1. Unremarkable noncontrast CT of the brain.
2. Chronic paranasal sinus disease.

## 2023-04-30 NOTE — Therapy (Signed)
OUTPATIENT PHYSICAL THERAPY TREATMENT NOTE   Patient Name: Haley Singh MRN: [id number] DOB:March 08, 1951, 72 y.o., female Today's Date: 04/30/2023   END OF SESSION:  PT End of Session - 04/30/23 1438     Visit Number 15    Number of Visits 19    Date for PT Re-Evaluation 05/28/23    Authorization Type [employer] MCR    Authorization Time Period 04/09/2023 - 05/07/2023    Authorization - Visit Number 3    Authorization - Number of Visits 4    Progress Note Due on Visit 21    PT Start Time 1435    PT Stop Time 1520    PT Time Calculation (min) 45 min    Activity Tolerance Patient tolerated treatment well    Behavior During Therapy WFL for tasks assessed/performed                   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   Hyperlipidemia    Hypertension    followed by pcp  (11-10-2019 pt stated never had a stress test)   Insomnia    MDD (major depressive disorder)    Mild persistent asthma    followed by pcp and dr Belva Crome (pulmonologist)   RLS (restless legs syndrome)    Vertebral osteomyelitis (HCC) 02/21/2021   Past Surgical History:  Procedure Laterality Date   BUBBLE STUDY  01/25/2021   Procedure: BUBBLE STUDY;  Surgeon: Jodelle Red, MD;  Location: [facility] ENDOSCOPY;  Service: Cardiovascular;;   CATARACT EXTRACTION W/ INTRAOCULAR LENS  IMPLANT, BILATERAL  2009; 2010   CYSTOSCOPY/URETEROSCOPY/HOLMIUM LASER/STENT PLACEMENT Right 08/26/2019   Procedure: CYSTOSCOPY/RETROGRADE/URETEROSCOPY/HOLMIUM LASER/STENT PLACEMENT;  Surgeon: Rene Paci, MD;  Location: [facility];  Service: Urology;  Laterality: Right;   CYSTOSCOPY/URETEROSCOPY/HOLMIUM LASER/STENT PLACEMENT Left 11/17/2019    Procedure: CYSTOSCOPY/URETEROSCOPY, RETROGRADE,LASER LITHOTRIPSY, STENT PLACEMENT;  Surgeon: Rene Paci, MD;  Location: [facility];  Service: Urology;  Laterality: Left;   CYSTOSCOPY/URETEROSCOPY/HOLMIUM LASER/STENT PLACEMENT Left 02/19/2021   Procedure: CYSTOSCOPY/URETEROSCOPY/HOLMIUM LASER/STENT PLACEMENT;  Surgeon: Crista Elliot, MD;  Location: [facility] ORS;  Service: Urology;  Laterality: Left;   EXPLORATORY LAPAROTOMY W/ BOWEL RESECTION  01-15-2004  @[facility]    small bowel resection for stricture/ benzoar   EXTRACORPOREAL SHOCK WAVE LITHOTRIPSY Right 03/27/2018   Procedure: EXTRACORPOREAL SHOCK WAVE LITHOTRIPSY (ESWL);  Surgeon: Crist Fat, MD;  Location: [facility];  Service: Urology;  Laterality: Right;   EXTRACORPOREAL SHOCK WAVE LITHOTRIPSY  x2 2008;  2009;  2015   EXTRACORPOREAL SHOCK WAVE LITHOTRIPSY Left 11/09/2022   Procedure: LEFT EXTRACORPOREAL SHOCK WAVE LITHOTRIPSY (ESWL);  Surgeon: Rene Paci, MD;  Location: [facility];  Service: Urology;  Laterality: Left;  75 MINUTES   HOLMIUM LASER APPLICATION Left 11/17/2019   Procedure: HOLMIUM LASER APPLICATION;  Surgeon: Rene Paci, MD;  Location: [facility];  Service: Urology;  Laterality: Left;   IR FLUORO GUIDED NEEDLE PLC ASPIRATION/INJECTION LOC  02/22/2021   ORIF ANKLE FRACTURE Right 09/ 2008 @[facility]    per pt has retained hardware   TEE WITHOUT CARDIOVERSION N/A 01/25/2021   Procedure: TRANSESOPHAGEAL ECHOCARDIOGRAM (TEE);  Surgeon: Jodelle Red, MD;  Location: [facility]  ENDOSCOPY;  Service: Cardiovascular;  Laterality: N/A;   TONSILLECTOMY  age 72   Patient Active Problem List   Diagnosis Date Noted   Chronic pain of right knee 07/16/2022   Groin pain, left 04/23/2022   Spinal stenosis of lumbar region 04/23/2022   Back pain of lumbar region with sciatica 04/02/2022   Arthralgia 04/02/2022   Fatigue due to excessive exertion 01/31/2022    Weight gain 01/31/2022   Penicillin allergy    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   Vertebral osteomyelitis (HCC) 02/21/2021   Intractable nausea and vomiting 02/18/2021   Hypokalemia 02/18/2021   MSSA bacteremia    Discitis of lumbar region    Osteomyelitis (HCC) 01/17/2021   Mild persistent asthma without complication 05/17/2016   OSA (obstructive sleep apnea) 03/03/2014    PCP: Macy Mis, MD  REFERRING PROVIDER: Colvin Caroli, PA-C  REFERRING DIAG: Pain in left knee  THERAPY DIAG:  Chronic pain of left knee  Pain in left hip  Muscle weakness (generalized)  Other abnormalities of gait and mobility  Localized edema  Rationale for Evaluation and Treatment: Rehabilitation  ONSET DATE: November 2023, s/p left knee arthroscopy 10/03/22 and injection 02/05/2023   SUBJECTIVE:  SUBJECTIVE STATEMENT: Patient reports her left knee still gives her trouble every now and then. She still gets twinges in the left knee and it worsens when is rains. She has been going up and down the stairs more.  PERTINENT HISTORY: See PMH above  PAIN:  Are you having pain? Yes:  NPRS scale: 2/10  Pain location: Left knee Pain description: Sharp, stiff Aggravating factors: Bending the knee, walking and standing, turning a certain way Relieving factors: Lidocaine patch  PRECAUTIONS: Fall  PATIENT GOALS: Improve left knee and hip motion, improve strength and endurance of left knee and hip for walking   OBJECTIVE:  PATIENT SURVEYS:  FOTO 50% (predicted to reach 60%)  03/21/2023: 55%  04/04/2023: 56%  04/30/2023: 52%  EDEMA:  Not formally assessed, patient does visually demonstrate increased left knee and lower leg edema compared to the right  MUSCLE LENGTH: Limitations in bilateral hamstring, quad, and hip flexor flexibility   POSTURE:   Rounded shoulder, slight forward trunk lean, knee varus  PALPATION: Tender to palpation generalized left anterior knee and bilateral joint  line  LOWER EXTREMITY ROM:  Active ROM Right eval Left eval Left 03/06/2023 Left 03/12/2023  Hip flexion      Hip extension      Hip abduction      Hip adduction      Hip internal rotation      Hip external rotation 3 lacking 0 0   Knee flexion 125 107 110 120  Knee extension      Ankle dorsiflexion      Ankle plantarflexion      Ankle inversion      Ankle eversion       (Blank rows = not tested)  LOWER EXTREMITY MMT:  MMT Right eval Left eval Rt / Lt 04/02/2023 Lt 04/04/2023 Lt 04/09/2023 Left 04/30/2023  Hip flexion 3 4-      Hip extension      4- / 4-  Hip abduction 4- 3 4- / 4-   4 / 4  Hip adduction        Hip internal rotation        Hip external rotation        Knee flexion 5 4 5  / 4+ 4+    Knee extension 5  4 5 / 4+ 4+ 4+ 4+  Ankle dorsiflexion        Ankle plantarflexion        Ankle inversion        Ankle eversion         (Blank rows = not tested)  FUNCTIONAL TESTS:  5 times sit to stand: 13 seconds  03/14/2023: 11 seconds 2 minute walk test: 288 ft, patient carried cane for first 1:30, the required to use cane last :30 due to left knee pain  04/04/2023: 320 ft without needing to use the cane, 1 short rest break  04/30/2023: 370 ft without AD and no rest break SLS: unable on left  GAIT: Distance walked: 288 ft Assistive device utilized: Single point cane Level of assistance: Modified independence Comments: Antalgic gait on left  04/30/2023: 370 ft without AD, forward trunk lean and slight antalgic on left   TODAY'S TREATMENT:    [facility] Adult PT Treatment:                                                DATE: 04/30/23 Therapeutic Exercise: Nustep L7 x 6 mins with LE to improve LE endurance and workload capacity Bridge 2 x 10 Side clamshell with blue 3 x 10 on left LAQ with 5# 3 x 10 with 3 sec hold each Sit to stand x 10, holding 10# 2 x 10 Standing heel raises 2 x 20 Manual: Modified thomas stretch with passive hip extension 3 x 60 sec on  left Therapeutic Activity: Reassessment of LTGs for POC update consisting of FOTO, , MMT   [facility] Adult PT Treatment:                                                DATE: 04/16/23 Therapeutic Exercise: Nustep L7 x 6 mins with LE to improve LE endurance and workload capacity Bridge 2 x 10 LAQ with 5# 3 x 10 with 3 sec hold Sidelying hip abduction 3 x 15 Sit to stand 2 x 10 Manual: Seated and supine knee mobs for improved mobility and pain relief Modified thomas stretch with passive hip extension 3 x 30 sec Passive supine ITB stretch 3 x 30 sec  [facility] Adult PT Treatment:                                                DATE: 04/09/23 Therapeutic Exercise: Nustep L7 x 6 mins with LE to improve LE endurance and workload capacity Side clamshell with blue 3 x 15 each Leg press (cybex) DL 19# 3 x 10, SL 14# 3 x 6 SLS 5 x 15 sec each Knee extension machine DL 78# 2 x 15, SL 29# 2 x 10 Standing heel raises 2 x 20  [facility] Adult PT Treatment:                                                DATE: 04/04/23 Therapeutic Exercise: Nustep L5 x  6 mins with LE to improve LE endurance and workload capacity Leg press (cybex) DL 16# 3 x 10, SL 10# 2 x 10 Knee extension machine DL 96# 3 x 10, SL 04# 2 x 10 Side stepping at counter with green at knees 2 x 30 steps down/back Therapeutic Activity: Reassessment of LTGs for POC update consisting of FOTO, , MMT  [facility] Adult PT Treatment:                                                DATE: 04/02/23 Therapeutic Exercise: Nustep L5 x 6 mins with LE to improve LE endurance and workload capacity Modified thomas stretch 2 x 1 min each Bridge 2 x 10 Side clamshell with green 3 x 15 each Leg press (cybex) 70# 3 x 10  Forward 8" step-up 2 x 10 each Manual: Contract relax hip flexor stretching in modified thomas position  PATIENT EDUCATION:  Education details: POC extension, HEP update Person educated: Patient Education method: Programmer, multimedia, Facilities manager,  Handout Education comprehension: verbalized understanding, returned demonstration, and needs further education  HOME EXERCISE PROGRAM: Access Code: [email or web address]    ASSESSMENT: CLINICAL IMPRESSION: Patient tolerated therapy well with no adverse effects. Therapy continues to focus on improving her mobility and progressing LE strength with good tolerance. She does continue to exhibit a strength deficit on the right side but has improved with her walking tolerance. She did report a slight reduction in functional status on FOTO this visit but states that is due to the weather. She continues to progress with her HEP for strengthening at home. Patient would benefit from continued skilled PT to progress her mobility and strength in order to reduce pain and maximize functional ability, so will extend PT POC for 4 more weeks.   OBJECTIVE IMPAIRMENTS: Abnormal gait, decreased activity tolerance, decreased balance, decreased endurance, difficulty walking, decreased ROM, decreased strength, increased edema, impaired flexibility, postural dysfunction, and pain.   ACTIVITY LIMITATIONS: bending, sitting, standing, squatting, transfers, dressing, and locomotion level  PARTICIPATION LIMITATIONS: cleaning, shopping, and community activity  PERSONAL FACTORS: Fitness, Past/current experiences, and Time since onset of injury/illness/exacerbation are also affecting patient's functional outcome.    GOALS: Goals reviewed with patient? Yes  SHORT TERM GOALS: Target date: 03/14/2023  Patient will be I with initial HEP in order to progress with therapy. Baseline: HEP provided at eval 03/14/2023: independent with initial HEP Goal status: MET  2.  Patient will demonstrate left knee flexion AROM >/= 120 in order to improve transfers Baseline: 107 deg 03/12/2023: 120 deg Goal status: MET  3.  Patient will perform 5xSTS </=11 seconds in order to indicate improved LE strength and reduced fall risk Baseline: 13  seconds 03/14/2023: 11 seconds Goal status: MET  LONG TERM GOALS: Target date: 05/28/2023  Patient will be I with final HEP to maintain progress from PT. Baseline: HEP provided at eval 04/04/2023: progressing 04/30/2023: progressing Goal status: ONGOING  2.  Patient will report >/= 60% status on FOTO to indicate improved functional ability. Baseline: 50% 03/21/2023: 55% 04/04/2023: 56% 04/30/2023: 52% Goal status: ONGOING  3.  Patient will demonstrate left knee strength 5/5 MMT to improve walking tolerance Baseline: 4/5 MMT 04/04/2023: 4+/5 MMT 04/30/2023: 4+/5 MMT Goal status: ONGOING  4.  Patient will improve >/= 500 ft without SPC in order to improve community access  Baseline: 288 ft 04/04/2023: 320  ft without needing to use [facility] 04/30/2023: 370 ft without needing to use SPC Goal status: ONGOING   PLAN: PT FREQUENCY: 1x/week  PT DURATION: 4 weeks  PLANNED INTERVENTIONS: Therapeutic exercises, Therapeutic activity, Neuromuscular re-education, Balance training, Gait training, Patient/Family education, Self Care, Joint mobilization, Aquatic Therapy, Dry Needling, Electrical stimulation, Cryotherapy, Moist heat, Taping, Vasopneumatic device, Ionotophoresis 4mg /ml Dexamethasone, Manual therapy, and Re-evaluation  PLAN FOR NEXT SESSION: Review HEP and progress PRN, manual/stretching for left knee and hip mobility, progress left knee and hip strengthening, balance training   Hilbert Bible PT 04/30/23  3:21 PM Phone: [phone] Fax: [phone]   Date of referral: 02/05/2023 Referring provider: Colvin Caroli, PA-C Referring diagnosis? M25.562 (ICD-10-CM) - Pain in left knee  Treatment diagnosis? (if different than referring diagnosis) M25.562 (ICD-10-CM) - Pain in left knee    What was this (referring dx) caused by? Surgery (Type: left knee arthroscopy)   Ashby Dawes of Condition: Chronic (continuous duration > 3 months)              Laterality: Left    Current Functional Measure Score: FOTO 52%   Objective measurements identify impairments when they are compared to normal values, the uninvolved extremity, and prior level of function.             [x]  Yes             []  No   Objective assessment of functional ability: Minimal functional limitations              Briefly describe symptoms: Patient demonstrates limitations in left knee mobility and strength that are impacting her walking ability and functional status   How did symptoms start: Surgery   Average pain intensity:             Last 24 hours: 2/10             Past week: 0-2/10   How often does the pt experience symptoms? Occasionally   How much have the symptoms interfered with usual daily activities? A little bit   How has condition changed since care began at this facility? Better   In general, how is the patients overall health? Good     BACK PAIN (STarT Back Screening Tool) No

## 2023-04-30 NOTE — Patient Instructions (Signed)
Access Code: [email or web address] URL: [email or web address] Date: 04/30/2023 Prepared by: Rosana Hoes  Exercises - Modified Erby Pian  - 2 x daily - 3 reps - 60 seconds hold - Supine Heel Slide with Strap  - 2 x daily - 5-10 reps - 5-10 seconds hold - Active Straight Leg Raise with Quad Set  - 1-2 x daily - 2 sets - 10 reps - Bridge  - 1 x daily - 3 sets - 10 reps - Clam with Resistance  - 1 x daily - 3 sets - 10 reps - Sit to Stand Without Arm Support  - 1-2 x daily - 2 sets - 10 reps - Hip Flexor Stretch with Chair  - 2 x daily - 3 reps - 30 seconds hold

## 2023-05-06 NOTE — Therapy (Unsigned)
OUTPATIENT PHYSICAL THERAPY TREATMENT NOTE   Patient Name: Haley Singh MRN: [id number] DOB:[birth year], 72 y.o., female Today's Date: 05/08/2023   END OF SESSION:  PT End of Session - 05/08/23 1455     Visit Number 16    Number of Visits 19    Date for PT Re-Evaluation 05/28/23    Authorization Type [employer] MCR    Authorization Time Period 04/09/2023 - 05/07/2023    Authorization - Number of Visits 4    Progress Note Due on Visit 21    PT Start Time 1450    PT Stop Time 1530    PT Time Calculation (min) 40 min    Activity Tolerance Patient tolerated treatment well    Behavior During Therapy [facility] for tasks assessed/performed                    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   Hyperlipidemia    Hypertension    followed by pcp  (11-10-2019 pt stated never had a stress test)   Insomnia    MDD (major depressive disorder)    Mild persistent asthma    followed by pcp and dr Belva Crome (pulmonologist)   RLS (restless legs syndrome)    Vertebral osteomyelitis (HCC) 02/21/2021   Past Surgical History:  Procedure Laterality Date   BUBBLE STUDY  01/25/2021   Procedure: BUBBLE STUDY;  Surgeon: Jodelle Red, MD;  Location: [facility] ENDOSCOPY;  Service: Cardiovascular;;   CATARACT EXTRACTION W/ INTRAOCULAR LENS  IMPLANT, BILATERAL  2009; 2010   CYSTOSCOPY/URETEROSCOPY/HOLMIUM LASER/STENT PLACEMENT Right 08/26/2019   Procedure: CYSTOSCOPY/RETROGRADE/URETEROSCOPY/HOLMIUM LASER/STENT PLACEMENT;  Surgeon: Rene Paci, MD;  Location: [facility];  Service: Urology;  Laterality: Right;   CYSTOSCOPY/URETEROSCOPY/HOLMIUM LASER/STENT PLACEMENT Left 11/17/2019   Procedure: CYSTOSCOPY/URETEROSCOPY,  RETROGRADE,LASER LITHOTRIPSY, STENT PLACEMENT;  Surgeon: Rene Paci, MD;  Location: [facility];  Service: Urology;  Laterality: Left;   CYSTOSCOPY/URETEROSCOPY/HOLMIUM LASER/STENT PLACEMENT Left 02/19/2021   Procedure: CYSTOSCOPY/URETEROSCOPY/HOLMIUM LASER/STENT PLACEMENT;  Surgeon: Crista Elliot, MD;  Location: [facility] ORS;  Service: Urology;  Laterality: Left;   EXPLORATORY LAPAROTOMY W/ BOWEL RESECTION  01-15-2004  @[facility]    small bowel resection for stricture/ benzoar   EXTRACORPOREAL SHOCK WAVE LITHOTRIPSY Right 03/27/2018   Procedure: EXTRACORPOREAL SHOCK WAVE LITHOTRIPSY (ESWL);  Surgeon: Crist Fat, MD;  Location: [facility];  Service: Urology;  Laterality: Right;   EXTRACORPOREAL SHOCK WAVE LITHOTRIPSY  x2 2008;  2009;  2015   EXTRACORPOREAL SHOCK WAVE LITHOTRIPSY Left 11/09/2022   Procedure: LEFT EXTRACORPOREAL SHOCK WAVE LITHOTRIPSY (ESWL);  Surgeon: Rene Paci, MD;  Location: [facility];  Service: Urology;  Laterality: Left;  75 MINUTES   HOLMIUM LASER APPLICATION Left 11/17/2019   Procedure: HOLMIUM LASER APPLICATION;  Surgeon: Rene Paci, MD;  Location: [facility];  Service: Urology;  Laterality: Left;   IR FLUORO GUIDED NEEDLE PLC ASPIRATION/INJECTION LOC  02/22/2021   ORIF ANKLE FRACTURE Right 09/ 2008 @[facility]    per pt has retained hardware   TEE WITHOUT CARDIOVERSION N/A 01/25/2021   Procedure: TRANSESOPHAGEAL ECHOCARDIOGRAM (TEE);  Surgeon: Jodelle Red, MD;  Location: [facility] ENDOSCOPY;  Service: Cardiovascular;  Laterality: N/A;  TONSILLECTOMY  age 59   Patient Active Problem List   Diagnosis Date Noted   Chronic pain of right knee 07/16/2022   Groin pain, left 04/23/2022   Spinal stenosis of lumbar region 04/23/2022   Back pain of lumbar region with sciatica 04/02/2022   Arthralgia 04/02/2022   Fatigue due to excessive exertion 01/31/2022   Weight gain 01/31/2022   Penicillin  allergy    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   Vertebral osteomyelitis (HCC) 02/21/2021   Intractable nausea and vomiting 02/18/2021   Hypokalemia 02/18/2021   MSSA bacteremia    Discitis of lumbar region    Osteomyelitis (HCC) 01/17/2021   Mild persistent asthma without complication 05/17/2016   OSA (obstructive sleep apnea) 03/03/2014    PCP: Macy Mis, MD  REFERRING PROVIDER: Colvin Caroli, PA-C  REFERRING DIAG: Pain in left knee  THERAPY DIAG:  Chronic pain of left knee  Pain in left hip  Muscle weakness (generalized)  Rationale for Evaluation and Treatment: Rehabilitation  ONSET DATE: November 2023, s/p left knee arthroscopy 10/03/22 and injection 02/05/2023   SUBJECTIVE:  SUBJECTIVE STATEMENT: L knee lateral patellofemoral discomfort reported at night and with certain movement, unable to recall exact motions or positions that elicit symptoms  PERTINENT HISTORY: See PMH above  PAIN:  Are you having pain? Yes:  NPRS scale: 2/10  Pain location: Left knee Pain description: Sharp, stiff Aggravating factors: Bending the knee, walking and standing, turning a certain way Relieving factors: Lidocaine patch  PRECAUTIONS: Fall  PATIENT GOALS: Improve left knee and hip motion, improve strength and endurance of left knee and hip for walking   OBJECTIVE:  PATIENT SURVEYS:  FOTO 50% (predicted to reach 60%)  03/21/2023: 55%  04/04/2023: 56%  04/30/2023: 52%  EDEMA:  Not formally assessed, patient does visually demonstrate increased left knee and lower leg edema compared to the right  MUSCLE LENGTH: Limitations in bilateral hamstring, quad, and hip flexor flexibility   POSTURE:   Rounded shoulder, slight forward trunk lean, knee varus  PALPATION: Tender to palpation generalized left anterior knee and bilateral joint line  LOWER EXTREMITY ROM:  Active ROM Right eval Left eval Left 03/06/2023 Left 03/12/2023  Hip flexion      Hip extension      Hip  abduction      Hip adduction      Hip internal rotation      Hip external rotation 3 lacking 0 0   Knee flexion 125 107 110 120  Knee extension      Ankle dorsiflexion      Ankle plantarflexion      Ankle inversion      Ankle eversion       (Blank rows = not tested)  LOWER EXTREMITY MMT:  MMT Right eval Left eval Rt / Lt 04/02/2023 Lt 04/04/2023 Lt 04/09/2023 Left 04/30/2023  Hip flexion 3 4-      Hip extension      4- / 4-  Hip abduction 4- 3 4- / 4-   4 / 4  Hip adduction        Hip internal rotation        Hip external rotation        Knee flexion 5 4 5  / 4+ 4+    Knee extension 5 4 5  / 4+ 4+ 4+ 4+  Ankle dorsiflexion        Ankle plantarflexion        Ankle inversion  Ankle eversion         (Blank rows = not tested)  FUNCTIONAL TESTS:  5 times sit to stand: 13 seconds  03/14/2023: 11 seconds 2 minute walk test: 288 ft, patient carried cane for first 1:30, the required to use cane last :30 due to left knee pain  04/04/2023: 320 ft without needing to use the cane, 1 short rest break  04/30/2023: 370 ft without AD and no rest break SLS: unable on left  GAIT: Distance walked: 288 ft Assistive device utilized: Single point cane Level of assistance: Modified independence Comments: Antalgic gait on left  04/30/2023: 370 ft without AD, forward trunk lean and slight antalgic on left   TODAY'S TREATMENT:    [facility] Adult PT Treatment:                                                DATE: 05/08/23 Therapeutic Exercise: QS 2s 15x SLR 2s 15x SAQ 3# 15x2 2s FAQ with adduction 3# 15x2 S/L L abduction 3# w/extension bias by PT Taping applied to L knee using medial glide technique.  Patient instructed to remove tape with any increased pain, skin irritation or in the event the tape loosens and can not be re-applied. Cautioned to never apply Leukotape directly over skin without protective cover roll in place.   [facility] Adult PT Treatment:                                                 DATE: 04/30/23 Therapeutic Exercise: Nustep L7 x 6 mins with LE to improve LE endurance and workload capacity Bridge 2 x 10 Side clamshell with blue 3 x 10 on left LAQ with 5# 3 x 10 with 3 sec hold each Sit to stand x 10, holding 10# 2 x 10 Standing heel raises 2 x 20 Manual: Modified thomas stretch with passive hip extension 3 x 60 sec on left Therapeutic Activity: Reassessment of LTGs for POC update consisting of FOTO, , MMT   [facility] Adult PT Treatment:                                                DATE: 04/16/23 Therapeutic Exercise: Nustep L7 x 6 mins with LE to improve LE endurance and workload capacity Bridge 2 x 10 LAQ with 5# 3 x 10 with 3 sec hold Sidelying hip abduction 3 x 15 Sit to stand 2 x 10 Manual: Seated and supine knee mobs for improved mobility and pain relief Modified thomas stretch with passive hip extension 3 x 30 sec Passive supine ITB stretch 3 x 30 sec  [facility] Adult PT Treatment:                                                DATE: 04/09/23 Therapeutic Exercise: Nustep L7 x 6 mins with LE to improve LE endurance and workload capacity Side clamshell with blue 3 x 15 each Leg press (cybex) DL 69#  3 x 10, SL 30# 3 x 6 SLS 5 x 15 sec each Knee extension machine DL 16# 2 x 15, SL 10# 2 x 10 Standing heel raises 2 x 20  [facility] Adult PT Treatment:                                                DATE: 04/04/23 Therapeutic Exercise: Nustep L5 x 6 mins with LE to improve LE endurance and workload capacity Leg press (cybex) DL 96# 3 x 10, SL 04# 2 x 10 Knee extension machine DL 54# 3 x 10, SL 09# 2 x 10 Side stepping at counter with green at knees 2 x 30 steps down/back Therapeutic Activity: Reassessment of LTGs for POC update consisting of FOTO, , MMT  [facility] Adult PT Treatment:                                                DATE: 04/02/23 Therapeutic Exercise: Nustep L5 x 6 mins with LE to improve LE endurance and workload capacity Modified  thomas stretch 2 x 1 min each Bridge 2 x 10 Side clamshell with green 3 x 15 each Leg press (cybex) 70# 3 x 10  Forward 8" step-up 2 x 10 each Manual: Contract relax hip flexor stretching in modified thomas position  PATIENT EDUCATION:  Education details: POC extension, HEP update Person educated: Patient Education method: Programmer, multimedia, Facilities manager, Handout Education comprehension: verbalized understanding, returned demonstration, and needs further education  HOME EXERCISE PROGRAM: Access Code: [email or web address]    ASSESSMENT: CLINICAL IMPRESSION: Today's session concentrated on TKE and VMO strengthening to address excess lateral during an active quad contraction due to VMO latency and weakness.  Marked abduction strength noted especially when placed in hip extension bias.  McConnell tape applied at end of session to validate PF dysfunction hypothesis.  Patient tolerated therapy well with no adverse effects. Therapy continues to focus on improving her mobility and progressing LE strength with good tolerance. She does continue to exhibit a strength deficit on the right side but has improved with her walking tolerance. She did report a slight reduction in functional status on FOTO this visit but states that is due to the weather. She continues to progress with her HEP for strengthening at home. Patient would benefit from continued skilled PT to progress her mobility and strength in order to reduce pain and maximize functional ability, so will extend PT POC for 4 more weeks.   OBJECTIVE IMPAIRMENTS: Abnormal gait, decreased activity tolerance, decreased balance, decreased endurance, difficulty walking, decreased ROM, decreased strength, increased edema, impaired flexibility, postural dysfunction, and pain.   ACTIVITY LIMITATIONS: bending, sitting, standing, squatting, transfers, dressing, and locomotion level  PARTICIPATION LIMITATIONS: cleaning, shopping, and community activity  PERSONAL FACTORS:  Fitness, Past/current experiences, and Time since onset of injury/illness/exacerbation are also affecting patient's functional outcome.    GOALS: Goals reviewed with patient? Yes  SHORT TERM GOALS: Target date: 03/14/2023  Patient will be I with initial HEP in order to progress with therapy. Baseline: HEP provided at eval 03/14/2023: independent with initial HEP Goal status: MET  2.  Patient will demonstrate left knee flexion AROM >/= 120 in order to improve transfers Baseline: 107 deg 03/12/2023: 120 deg Goal  status: MET  3.  Patient will perform 5xSTS </=11 seconds in order to indicate improved LE strength and reduced fall risk Baseline: 13 seconds 03/14/2023: 11 seconds Goal status: MET  LONG TERM GOALS: Target date: 05/28/2023  Patient will be I with final HEP to maintain progress from PT. Baseline: HEP provided at eval 04/04/2023: progressing 04/30/2023: progressing Goal status: ONGOING  2.  Patient will report >/= 60% status on FOTO to indicate improved functional ability. Baseline: 50% 03/21/2023: 55% 04/04/2023: 56% 04/30/2023: 52% Goal status: ONGOING  3.  Patient will demonstrate left knee strength 5/5 MMT to improve walking tolerance Baseline: 4/5 MMT 04/04/2023: 4+/5 MMT 04/30/2023: 4+/5 MMT Goal status: ONGOING  4.  Patient will improve >/= 500 ft without SPC in order to improve community access  Baseline: 288 ft 04/04/2023: 320 ft without needing to use [facility] 04/30/2023: 370 ft without needing to use SPC Goal status: ONGOING   PLAN: PT FREQUENCY: 1x/week  PT DURATION: 4 weeks  PLANNED INTERVENTIONS: Therapeutic exercises, Therapeutic activity, Neuromuscular re-education, Balance training, Gait training, Patient/Family education, Self Care, Joint mobilization, Aquatic Therapy, Dry Needling, Electrical stimulation, Cryotherapy, Moist heat, Taping, Vasopneumatic device, Ionotophoresis 4mg /ml Dexamethasone, Manual therapy, and Re-evaluation  PLAN  FOR NEXT SESSION: Review HEP and progress PRN, manual/stretching for left knee and hip mobility, progress left knee and hip strengthening, balance training   Hildred Laser PT 05/08/23  3:50 PM Phone: [phone] Fax: [phone]   Date of referral: 02/05/2023 Referring provider: Colvin Caroli, PA-C Referring diagnosis? M25.562 (ICD-10-CM) - Pain in left knee  Treatment diagnosis? (if different than referring diagnosis) M25.562 (ICD-10-CM) - Pain in left knee    What was this (referring dx) caused by? Surgery (Type: left knee arthroscopy)   Ashby Dawes of Condition: Chronic (continuous duration > 3 months)              Laterality: Left   Current Functional Measure Score: FOTO 52%   Objective measurements identify impairments when they are compared to normal values, the uninvolved extremity, and prior level of function.             [x]  Yes             []  No   Objective assessment of functional ability: Minimal functional limitations              Briefly describe symptoms: Patient demonstrates limitations in left knee mobility and strength that are impacting her walking ability and functional status   How did symptoms start: Surgery   Average pain intensity:             Last 24 hours: 2/10             Past week: 0-2/10   How often does the pt experience symptoms? Occasionally   How much have the symptoms interfered with usual daily activities? A little bit   How has condition changed since care began at this facility? Better   In general, how is the patients overall health? Good     BACK PAIN (STarT Back Screening Tool) No

## 2023-05-08 ENCOUNTER — Ambulatory Visit: Payer: Medicare Other | Attending: Physician Assistant

## 2023-05-08 DIAGNOSIS — M25552 Pain in left hip: Secondary | ICD-10-CM | POA: Insufficient documentation

## 2023-05-08 DIAGNOSIS — R2689 Other abnormalities of gait and mobility: Secondary | ICD-10-CM | POA: Insufficient documentation

## 2023-05-08 DIAGNOSIS — G8929 Other chronic pain: Secondary | ICD-10-CM | POA: Diagnosis present

## 2023-05-08 DIAGNOSIS — M25562 Pain in left knee: Secondary | ICD-10-CM | POA: Insufficient documentation

## 2023-05-08 DIAGNOSIS — R600 Localized edema: Secondary | ICD-10-CM | POA: Diagnosis present

## 2023-05-08 DIAGNOSIS — M6281 Muscle weakness (generalized): Secondary | ICD-10-CM | POA: Insufficient documentation

## 2023-05-15 NOTE — Therapy (Signed)
OUTPATIENT PHYSICAL THERAPY TREATMENT NOTE   Patient Name: Haley Singh MRN: [id number] DOB:1950/07/31, 72 y.o., female Today's Date: 05/17/2023   END OF SESSION:  PT End of Session - 05/17/23 1001     Visit Number 17    Number of Visits 19    Date for PT Re-Evaluation 05/28/23    Authorization Type [employer] MCR    Authorization Time Period 04/09/2023 - 05/07/2023    Authorization - Number of Visits 4    Progress Note Due on Visit 21    PT Start Time 1000    PT Stop Time 1040    PT Time Calculation (min) 40 min    Activity Tolerance Patient tolerated treatment well    Behavior During Therapy WFL for tasks assessed/performed                     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   Hyperlipidemia    Hypertension    followed by pcp  (11-10-2019 pt stated never had a stress test)   Insomnia    MDD (major depressive disorder)    Mild persistent asthma    followed by pcp and dr Belva Crome (pulmonologist)   RLS (restless legs syndrome)    Vertebral osteomyelitis (HCC) 02/21/2021   Past Surgical History:  Procedure Laterality Date   BUBBLE STUDY  01/25/2021   Procedure: BUBBLE STUDY;  Surgeon: Jodelle Red, MD;  Location: [facility] ENDOSCOPY;  Service: Cardiovascular;;   CATARACT EXTRACTION W/ INTRAOCULAR LENS  IMPLANT, BILATERAL  2009; 2010   CYSTOSCOPY/URETEROSCOPY/HOLMIUM LASER/STENT PLACEMENT Right 08/26/2019   Procedure: CYSTOSCOPY/RETROGRADE/URETEROSCOPY/HOLMIUM LASER/STENT PLACEMENT;  Surgeon: Rene Paci, MD;  Location: [facility];  Service: Urology;  Laterality: Right;   CYSTOSCOPY/URETEROSCOPY/HOLMIUM LASER/STENT PLACEMENT Left 11/17/2019   Procedure:  CYSTOSCOPY/URETEROSCOPY, RETROGRADE,LASER LITHOTRIPSY, STENT PLACEMENT;  Surgeon: Rene Paci, MD;  Location: [facility];  Service: Urology;  Laterality: Left;   CYSTOSCOPY/URETEROSCOPY/HOLMIUM LASER/STENT PLACEMENT Left 02/19/2021   Procedure: CYSTOSCOPY/URETEROSCOPY/HOLMIUM LASER/STENT PLACEMENT;  Surgeon: Crista Elliot, MD;  Location: [facility];  Service: Urology;  Laterality: Left;   EXPLORATORY LAPAROTOMY W/ BOWEL RESECTION  01-15-2004  @[facility]    small bowel resection for stricture/ benzoar   EXTRACORPOREAL SHOCK WAVE LITHOTRIPSY Right 03/27/2018   Procedure: EXTRACORPOREAL SHOCK WAVE LITHOTRIPSY (ESWL);  Surgeon: Crist Fat, MD;  Location: [facility];  Service: Urology;  Laterality: Right;   EXTRACORPOREAL SHOCK WAVE LITHOTRIPSY  x2 2008;  2009;  2015   EXTRACORPOREAL SHOCK WAVE LITHOTRIPSY Left 11/09/2022   Procedure: LEFT EXTRACORPOREAL SHOCK WAVE LITHOTRIPSY (ESWL);  Surgeon: Rene Paci, MD;  Location: [facility];  Service: Urology;  Laterality: Left;  75 MINUTES   HOLMIUM LASER APPLICATION Left 11/17/2019   Procedure: HOLMIUM LASER APPLICATION;  Surgeon: Rene Paci, MD;  Location: [facility];  Service: Urology;  Laterality: Left;   IR FLUORO GUIDED NEEDLE PLC ASPIRATION/INJECTION LOC  02/22/2021   ORIF ANKLE FRACTURE Right 09/ 2008 @[facility]    per pt has retained hardware   TEE WITHOUT CARDIOVERSION N/A 01/25/2021   Procedure: TRANSESOPHAGEAL ECHOCARDIOGRAM (TEE);  Surgeon: Jodelle Red, MD;  Location: [facility] ENDOSCOPY;  Service: Cardiovascular;  Laterality:  N/A;   TONSILLECTOMY  age 7   Patient Active Problem List   Diagnosis Date Noted   Chronic pain of right knee 07/16/2022   Groin pain, left 04/23/2022   Spinal stenosis of lumbar region 04/23/2022   Back pain of lumbar region with sciatica 04/02/2022   Arthralgia 04/02/2022   Fatigue due to excessive exertion 01/31/2022   Weight gain  01/31/2022   Penicillin allergy    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   Vertebral osteomyelitis (HCC) 02/21/2021   Intractable nausea and vomiting 02/18/2021   Hypokalemia 02/18/2021   MSSA bacteremia    Discitis of lumbar region    Osteomyelitis (HCC) 01/17/2021   Mild persistent asthma without complication 05/17/2016   OSA (obstructive sleep apnea) 03/03/2014    PCP: Macy Mis, MD  REFERRING PROVIDER: Colvin Caroli, PA-C  REFERRING DIAG: Pain in left knee  THERAPY DIAG:  Chronic pain of left knee  Pain in left hip  Muscle weakness (generalized)  Rationale for Evaluation and Treatment: Rehabilitation  ONSET DATE: November 2023, s/p left knee arthroscopy 10/03/22 and injection 02/05/2023   SUBJECTIVE:  SUBJECTIVE STATEMENT: Reports little to no L knee pain since last session.  Tape lasted 24 hours.  No symptoms reported through episode of rainy weather which normally aggravates symptoms.  PERTINENT HISTORY: See PMH above  PAIN:  Are you having pain? Yes:  NPRS scale: 2/10  Pain location: Left knee Pain description: Sharp, stiff Aggravating factors: Bending the knee, walking and standing, turning a certain way Relieving factors: Lidocaine patch  PRECAUTIONS: Fall  PATIENT GOALS: Improve left knee and hip motion, improve strength and endurance of left knee and hip for walking   OBJECTIVE:  PATIENT SURVEYS:  FOTO 50% (predicted to reach 60%)  03/21/2023: 55%  04/04/2023: 56%  04/30/2023: 52%  EDEMA:  Not formally assessed, patient does visually demonstrate increased left knee and lower leg edema compared to the right  MUSCLE LENGTH: Limitations in bilateral hamstring, quad, and hip flexor flexibility   POSTURE:   Rounded shoulder, slight forward trunk lean, knee varus  PALPATION: Tender to palpation generalized left anterior knee and bilateral joint line  LOWER EXTREMITY ROM:  Active ROM Right eval Left eval Left 03/06/2023 Left 03/12/2023  Hip  flexion      Hip extension      Hip abduction      Hip adduction      Hip internal rotation      Hip external rotation 3 lacking 0 0   Knee flexion 125 107 110 120  Knee extension      Ankle dorsiflexion      Ankle plantarflexion      Ankle inversion      Ankle eversion       (Blank rows = not tested)  LOWER EXTREMITY MMT:  MMT Right eval Left eval Rt / Lt 04/02/2023 Lt 04/04/2023 Lt 04/09/2023 Left 04/30/2023  Hip flexion 3 4-      Hip extension      4- / 4-  Hip abduction 4- 3 4- / 4-   4 / 4  Hip adduction        Hip internal rotation        Hip external rotation        Knee flexion 5 4 5  / 4+ 4+    Knee extension 5 4 5  / 4+ 4+ 4+ 4+  Ankle dorsiflexion        Ankle plantarflexion  Ankle inversion        Ankle eversion         (Blank rows = not tested)  FUNCTIONAL TESTS:  5 times sit to stand: 13 seconds  03/14/2023: 11 seconds 2 minute walk test: 288 ft, patient carried cane for first 1:30, the required to use cane last :30 due to left knee pain  04/04/2023: 320 ft without needing to use the cane, 1 short rest break  04/30/2023: 370 ft without AD and no rest break SLS: unable on left  GAIT: Distance walked: 288 ft Assistive device utilized: Single point cane Level of assistance: Modified independence Comments: Antalgic gait on left  04/30/2023: 370 ft without AD, forward trunk lean and slight antalgic on left   TODAY'S TREATMENT:    [facility] Adult PT Treatment:                                                DATE: 05/17/23 Therapeutic Exercise: QS 3s 15x2 over heel block SLR 2# 15x2 SAQ 4# 15x2 2s FAQ with adduction 4# 15x2 S/L L abduction 2# w/extension bias by PT Nustep L4 8 min Taping applied to L knee using medial glide technique.  Patient instructed to remove tape with any increased pain, skin irritation or in the event the tape loosens and can not be re-applied. Cautioned to never apply Leukotape directly over skin without protective cover roll  in place.    [facility] Adult PT Treatment:                                                DATE: 05/08/23 Therapeutic Exercise: QS 2s 15x SLR 2s 15x SAQ 3# 15x2 2s FAQ with adduction 3# 15x2 S/L L abduction 3# w/extension bias by PT Taping applied to L knee using medial glide technique.  Patient instructed to remove tape with any increased pain, skin irritation or in the event the tape loosens and can not be re-applied. Cautioned to never apply Leukotape directly over skin without protective cover roll in place.   [facility] Adult PT Treatment:                                                DATE: 04/30/23 Therapeutic Exercise: Nustep L7 x 6 mins with LE to improve LE endurance and workload capacity Bridge 2 x 10 Side clamshell with blue 3 x 10 on left LAQ with 5# 3 x 10 with 3 sec hold each Sit to stand x 10, holding 10# 2 x 10 Standing heel raises 2 x 20 Manual: Modified thomas stretch with passive hip extension 3 x 60 sec on left Therapeutic Activity: Reassessment of LTGs for POC update consisting of FOTO, , MMT   [facility] Adult PT Treatment:                                                DATE: 04/16/23 Therapeutic Exercise: Nustep L7 x 6 mins with LE to improve LE  endurance and workload capacity Bridge 2 x 10 LAQ with 5# 3 x 10 with 3 sec hold Sidelying hip abduction 3 x 15 Sit to stand 2 x 10 Manual: Seated and supine knee mobs for improved mobility and pain relief Modified thomas stretch with passive hip extension 3 x 30 sec Passive supine ITB stretch 3 x 30 sec  [facility] Adult PT Treatment:                                                DATE: 04/09/23 Therapeutic Exercise: Nustep L7 x 6 mins with LE to improve LE endurance and workload capacity Side clamshell with blue 3 x 15 each Leg press (cybex) DL 09# 3 x 10, SL 81# 3 x 6 SLS 5 x 15 sec each Knee extension machine DL 19# 2 x 15, SL 14# 2 x 10 Standing heel raises 2 x 20  [facility] Adult PT Treatment:                                                 DATE: 04/04/23 Therapeutic Exercise: Nustep L5 x 6 mins with LE to improve LE endurance and workload capacity Leg press (cybex) DL 78# 3 x 10, SL 29# 2 x 10 Knee extension machine DL 56# 3 x 10, SL 21# 2 x 10 Side stepping at counter with green at knees 2 x 30 steps down/back Therapeutic Activity: Reassessment of LTGs for POC update consisting of FOTO, , MMT  [facility] Adult PT Treatment:                                                DATE: 04/02/23 Therapeutic Exercise: Nustep L5 x 6 mins with LE to improve LE endurance and workload capacity Modified thomas stretch 2 x 1 min each Bridge 2 x 10 Side clamshell with green 3 x 15 each Leg press (cybex) 70# 3 x 10  Forward 8" step-up 2 x 10 each Manual: Contract relax hip flexor stretching in modified thomas position  PATIENT EDUCATION:  Education details: POC extension, HEP update Person educated: Patient Education method: Programmer, multimedia, Facilities manager, Handout Education comprehension: verbalized understanding, returned demonstration, and needs further education  HOME EXERCISE PROGRAM: Access Code: [email or web address]    ASSESSMENT: CLINICAL IMPRESSION: Increased reps as noted focus on TKE.  QS performed in long sit to eliminate hip extension substitution.  Good response to McConnell tape and more forceful VMO contraction noted.  Patient tolerated therapy well with no adverse effects. Therapy continues to focus on improving her mobility and progressing LE strength with good tolerance. She does continue to exhibit a strength deficit on the right side but has improved with her walking tolerance. She did report a slight reduction in functional status on FOTO this visit but states that is due to the weather. She continues to progress with her HEP for strengthening at home. Patient would benefit from continued skilled PT to progress her mobility and strength in order to reduce pain and maximize functional ability, so will extend PT POC for 4  more weeks.   OBJECTIVE IMPAIRMENTS: Abnormal gait, decreased  activity tolerance, decreased balance, decreased endurance, difficulty walking, decreased ROM, decreased strength, increased edema, impaired flexibility, postural dysfunction, and pain.   ACTIVITY LIMITATIONS: bending, sitting, standing, squatting, transfers, dressing, and locomotion level  PARTICIPATION LIMITATIONS: cleaning, shopping, and community activity  PERSONAL FACTORS: Fitness, Past/current experiences, and Time since onset of injury/illness/exacerbation are also affecting patient's functional outcome.    GOALS: Goals reviewed with patient? Yes  SHORT TERM GOALS: Target date: 03/14/2023  Patient will be I with initial HEP in order to progress with therapy. Baseline: HEP provided at eval 03/14/2023: independent with initial HEP Goal status: MET  2.  Patient will demonstrate left knee flexion AROM >/= 120 in order to improve transfers Baseline: 107 deg 03/12/2023: 120 deg Goal status: MET  3.  Patient will perform 5xSTS </=11 seconds in order to indicate improved LE strength and reduced fall risk Baseline: 13 seconds 03/14/2023: 11 seconds Goal status: MET  LONG TERM GOALS: Target date: 05/28/2023  Patient will be I with final HEP to maintain progress from PT. Baseline: HEP provided at eval 04/04/2023: progressing 04/30/2023: progressing Goal status: ONGOING  2.  Patient will report >/= 60% status on FOTO to indicate improved functional ability. Baseline: 50% 03/21/2023: 55% 04/04/2023: 56% 04/30/2023: 52% Goal status: ONGOING  3.  Patient will demonstrate left knee strength 5/5 MMT to improve walking tolerance Baseline: 4/5 MMT 04/04/2023: 4+/5 MMT 04/30/2023: 4+/5 MMT Goal status: ONGOING  4.  Patient will improve >/= 500 ft without SPC in order to improve community access  Baseline: 288 ft 04/04/2023: 320 ft without needing to use [facility] 04/30/2023: 370 ft without needing to use SPC Goal  status: ONGOING   PLAN: PT FREQUENCY: 1x/week  PT DURATION: 4 weeks  PLANNED INTERVENTIONS: Therapeutic exercises, Therapeutic activity, Neuromuscular re-education, Balance training, Gait training, Patient/Family education, Self Care, Joint mobilization, Aquatic Therapy, Dry Needling, Electrical stimulation, Cryotherapy, Moist heat, Taping, Vasopneumatic device, Ionotophoresis 4mg /ml Dexamethasone, Manual therapy, and Re-evaluation  PLAN FOR NEXT SESSION: Review HEP and progress PRN, manual/stretching for left knee and hip mobility, progress left knee and hip strengthening, balance training   Hildred Laser PT 05/17/23  10:56 AM Phone: [phone] Fax: [phone]   Date of referral: 02/05/2023 Referring provider: Colvin Caroli, PA-C Referring diagnosis? M25.562 (ICD-10-CM) - Pain in left knee  Treatment diagnosis? (if different than referring diagnosis) M25.562 (ICD-10-CM) - Pain in left knee    What was this (referring dx) caused by? Surgery (Type: left knee arthroscopy)   Ashby Dawes of Condition: Chronic (continuous duration > 3 months)              Laterality: Left   Current Functional Measure Score: FOTO 52%   Objective measurements identify impairments when they are compared to normal values, the uninvolved extremity, and prior level of function.             [x]  Yes             []  No   Objective assessment of functional ability: Minimal functional limitations              Briefly describe symptoms: Patient demonstrates limitations in left knee mobility and strength that are impacting her walking ability and functional status   How did symptoms start: Surgery   Average pain intensity:             Last 24 hours: 2/10             Past week: 0-2/10   How often does the  pt experience symptoms? Occasionally   How much have the symptoms interfered with usual daily activities? A little bit   How has condition changed since care began at this facility? Better    In general, how is the patients overall health? Good     BACK PAIN (STarT Back Screening Tool) No

## 2023-05-17 ENCOUNTER — Ambulatory Visit: Payer: Medicare Other

## 2023-05-17 DIAGNOSIS — M25562 Pain in left knee: Secondary | ICD-10-CM | POA: Diagnosis not present

## 2023-05-17 DIAGNOSIS — M6281 Muscle weakness (generalized): Secondary | ICD-10-CM

## 2023-05-17 DIAGNOSIS — G8929 Other chronic pain: Secondary | ICD-10-CM

## 2023-05-17 DIAGNOSIS — M25552 Pain in left hip: Secondary | ICD-10-CM

## 2023-05-21 ENCOUNTER — Ambulatory Visit: Payer: Medicare Other

## 2023-05-21 DIAGNOSIS — M25562 Pain in left knee: Secondary | ICD-10-CM | POA: Diagnosis not present

## 2023-05-21 DIAGNOSIS — M25552 Pain in left hip: Secondary | ICD-10-CM

## 2023-05-21 DIAGNOSIS — M6281 Muscle weakness (generalized): Secondary | ICD-10-CM

## 2023-05-21 DIAGNOSIS — R2689 Other abnormalities of gait and mobility: Secondary | ICD-10-CM

## 2023-05-21 DIAGNOSIS — G8929 Other chronic pain: Secondary | ICD-10-CM

## 2023-05-21 DIAGNOSIS — R600 Localized edema: Secondary | ICD-10-CM

## 2023-05-21 NOTE — Therapy (Signed)
OUTPATIENT PHYSICAL THERAPY TREATMENT NOTE   Patient Name: Haley Singh MRN: [id number] DOB:[birth year], 72 y.o., female Today's Date: 05/21/2023   END OF SESSION:  PT End of Session - 05/21/23 1154     Visit Number 18    Number of Visits 19    Date for PT Re-Evaluation 05/28/23    Authorization Type [employer] MCR    Authorization Time Period Waiting on additional approved auth - submitted for 4 visits starting 12/4    Progress Note Due on Visit 21    PT Start Time 1200    PT Stop Time 1240    PT Time Calculation (min) 40 min    Activity Tolerance Patient tolerated treatment well    Behavior During Therapy WFL for tasks assessed/performed             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   Hyperlipidemia    Hypertension    followed by pcp  (11-10-2019 pt stated never had a stress test)   Insomnia    MDD (major depressive disorder)    Mild persistent asthma    followed by pcp and dr Belva Crome (pulmonologist)   RLS (restless legs syndrome)    Vertebral osteomyelitis (HCC) 02/21/2021   Past Surgical History:  Procedure Laterality Date   BUBBLE STUDY  01/25/2021   Procedure: BUBBLE STUDY;  Surgeon: Jodelle Red, MD;  Location: [facility] ENDOSCOPY;  Service: Cardiovascular;;   CATARACT EXTRACTION W/ INTRAOCULAR LENS  IMPLANT, BILATERAL  2009; 2010   CYSTOSCOPY/URETEROSCOPY/HOLMIUM LASER/STENT PLACEMENT Right 08/26/2019   Procedure: CYSTOSCOPY/RETROGRADE/URETEROSCOPY/HOLMIUM LASER/STENT PLACEMENT;  Surgeon: Rene Paci, MD;  Location: [facility];  Service: Urology;  Laterality: Right;   CYSTOSCOPY/URETEROSCOPY/HOLMIUM LASER/STENT PLACEMENT Left 11/17/2019   Procedure:  CYSTOSCOPY/URETEROSCOPY, RETROGRADE,LASER LITHOTRIPSY, STENT PLACEMENT;  Surgeon: Rene Paci, MD;  Location: [facility];  Service: Urology;  Laterality: Left;   CYSTOSCOPY/URETEROSCOPY/HOLMIUM LASER/STENT PLACEMENT Left 02/19/2021   Procedure: CYSTOSCOPY/URETEROSCOPY/HOLMIUM LASER/STENT PLACEMENT;  Surgeon: Crista Elliot, MD;  Location: [facility] ORS;  Service: Urology;  Laterality: Left;   EXPLORATORY LAPAROTOMY W/ BOWEL RESECTION  01-15-2004  @[facility]    small bowel resection for stricture/ benzoar   EXTRACORPOREAL SHOCK WAVE LITHOTRIPSY Right 03/27/2018   Procedure: EXTRACORPOREAL SHOCK WAVE LITHOTRIPSY (ESWL);  Surgeon: Crist Fat, MD;  Location: [facility];  Service: Urology;  Laterality: Right;   EXTRACORPOREAL SHOCK WAVE LITHOTRIPSY  x2 2008;  2009;  2015   EXTRACORPOREAL SHOCK WAVE LITHOTRIPSY Left 11/09/2022   Procedure: LEFT EXTRACORPOREAL SHOCK WAVE LITHOTRIPSY (ESWL);  Surgeon: Rene Paci, MD;  Location: [facility];  Service: Urology;  Laterality: Left;  75 MINUTES   HOLMIUM LASER APPLICATION Left 11/17/2019   Procedure: HOLMIUM LASER APPLICATION;  Surgeon: Rene Paci, MD;  Location: [facility];  Service: Urology;  Laterality: Left;   IR FLUORO GUIDED NEEDLE PLC ASPIRATION/INJECTION LOC  02/22/2021   ORIF ANKLE FRACTURE Right 09/ 2008 @[facility]    per pt has retained hardware   TEE WITHOUT CARDIOVERSION N/A 01/25/2021   Procedure: TRANSESOPHAGEAL ECHOCARDIOGRAM (TEE);  Surgeon: Jodelle Red, MD;  Location: [facility] ENDOSCOPY;  Service: Cardiovascular;  Laterality: N/A;   TONSILLECTOMY  age 1  Patient Active Problem List   Diagnosis Date Noted   Chronic pain of right knee 07/16/2022   Groin pain, left 04/23/2022   Spinal stenosis of lumbar region 04/23/2022   Back pain of lumbar region with sciatica 04/02/2022   Arthralgia 04/02/2022   Fatigue due to excessive exertion 01/31/2022   Weight gain  01/31/2022   Penicillin allergy    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   Vertebral osteomyelitis (HCC) 02/21/2021   Intractable nausea and vomiting 02/18/2021   Hypokalemia 02/18/2021   MSSA bacteremia    Discitis of lumbar region    Osteomyelitis (HCC) 01/17/2021   Mild persistent asthma without complication 05/17/2016   OSA (obstructive sleep apnea) 03/03/2014    PCP: Macy Mis, MD  REFERRING PROVIDER: Colvin Caroli, PA-C  REFERRING DIAG: Pain in left knee  THERAPY DIAG:  Chronic pain of left knee  Pain in left hip  Muscle weakness (generalized)  Other abnormalities of gait and mobility  Localized edema  Rationale for Evaluation and Treatment: Rehabilitation  ONSET DATE: November 2023, s/p left knee arthroscopy 10/03/22 and injection 02/05/2023   SUBJECTIVE:  SUBJECTIVE STATEMENT: Patient reports that taping is helping and her knee pain is minimal recently.  PERTINENT HISTORY: See PMH above  PAIN:  Are you having pain? Yes:  NPRS scale: 2/10  Pain location: Left knee Pain description: Sharp, stiff Aggravating factors: Bending the knee, walking and standing, turning a certain way Relieving factors: Lidocaine patch  PRECAUTIONS: Fall  PATIENT GOALS: Improve left knee and hip motion, improve strength and endurance of left knee and hip for walking   OBJECTIVE:  PATIENT SURVEYS:  FOTO 50% (predicted to reach 60%)  03/21/2023: 55%  04/04/2023: 56%  04/30/2023: 52%  EDEMA:  Not formally assessed, patient does visually demonstrate increased left knee and lower leg edema compared to the right  MUSCLE LENGTH: Limitations in bilateral hamstring, quad, and hip flexor flexibility   POSTURE:   Rounded shoulder, slight forward trunk lean, knee varus  PALPATION: Tender to palpation generalized left anterior knee and bilateral joint line  LOWER EXTREMITY ROM:  Active ROM Right eval Left eval Left 03/06/2023 Left 03/12/2023  Hip flexion      Hip  extension      Hip abduction      Hip adduction      Hip internal rotation      Hip external rotation 3 lacking 0 0   Knee flexion 125 107 110 120  Knee extension      Ankle dorsiflexion      Ankle plantarflexion      Ankle inversion      Ankle eversion       (Blank rows = not tested)  LOWER EXTREMITY MMT:  MMT Right eval Left eval Rt / Lt 04/02/2023 Lt 04/04/2023 Lt 04/09/2023 Left 04/30/2023  Hip flexion 3 4-      Hip extension      4- / 4-  Hip abduction 4- 3 4- / 4-   4 / 4  Hip adduction        Hip internal rotation        Hip external rotation        Knee flexion 5 4 5  / 4+ 4+    Knee extension 5 4 5  / 4+ 4+ 4+ 4+  Ankle dorsiflexion        Ankle plantarflexion        Ankle inversion        Ankle eversion         (  Blank rows = not tested)  FUNCTIONAL TESTS:  5 times sit to stand: 13 seconds  03/14/2023: 11 seconds 2 minute walk test: 288 ft, patient carried cane for first 1:30, the required to use cane last :30 due to left knee pain  04/04/2023: 320 ft without needing to use the cane, 1 short rest break  04/30/2023: 370 ft without AD and no rest break SLS: unable on left  GAIT: Distance walked: 288 ft Assistive device utilized: Single point cane Level of assistance: Modified independence Comments: Antalgic gait on left  04/30/2023: 370 ft without AD, forward trunk lean and slight antalgic on left   TODAY'S TREATMENT:    [facility] Adult PT Treatment:                                                DATE: 05/21/23 Therapeutic Exercise: Nustep L4 8 min Standing heel raises 2x15 SLR 2# x15 BIL, x10 BIL SAQ 4# 15x2 2s FAQ with adduction 4# 15x2 STS x15 Manual Therapy: Taping applied to L knee using medial glide technique.  Patient instructed to remove tape with any increased pain, skin irritation or in the event the tape loosens and can not be re-applied. Cautioned to never apply Leukotape directly over skin without protective cover roll in place.    [facility]  Adult PT Treatment:                                                DATE: 05/17/23 Therapeutic Exercise: QS 3s 15x2 over heel block SLR 2# 15x2 SAQ 4# 15x2 2s FAQ with adduction 4# 15x2 S/L L abduction 2# w/extension bias by PT Nustep L4 8 min Taping applied to L knee using medial glide technique.  Patient instructed to remove tape with any increased pain, skin irritation or in the event the tape loosens and can not be re-applied. Cautioned to never apply Leukotape directly over skin without protective cover roll in place.    [facility] Adult PT Treatment:                                                DATE: 05/08/23 Therapeutic Exercise: QS 2s 15x SLR 2s 15x SAQ 3# 15x2 2s FAQ with adduction 3# 15x2 S/L L abduction 3# w/extension bias by PT Taping applied to L knee using medial glide technique.  Patient instructed to remove tape with any increased pain, skin irritation or in the event the tape loosens and can not be re-applied. Cautioned to never apply Leukotape directly over skin without protective cover roll in place.    PATIENT EDUCATION:  Education details: POC extension, HEP update Person educated: Patient Education method: Explanation, Demonstration, Handout Education comprehension: verbalized understanding, returned demonstration, and needs further education  HOME EXERCISE PROGRAM: Access Code: [email or web address]    ASSESSMENT: CLINICAL IMPRESSION:  Patient presents to PT reporting continued improvements in her knee pain and that the taping has been very helpful. Session today continued to focus on knee strengthening and utilization of tape for stability and pain modulation. Patient was able to tolerate all prescribed exercises with no adverse effects.  Patient continues to benefit from skilled PT services and should be progressed as able to improve functional independence.     OBJECTIVE IMPAIRMENTS: Abnormal gait, decreased activity tolerance, decreased balance, decreased endurance,  difficulty walking, decreased ROM, decreased strength, increased edema, impaired flexibility, postural dysfunction, and pain.   ACTIVITY LIMITATIONS: bending, sitting, standing, squatting, transfers, dressing, and locomotion level  PARTICIPATION LIMITATIONS: cleaning, shopping, and community activity  PERSONAL FACTORS: Fitness, Past/current experiences, and Time since onset of injury/illness/exacerbation are also affecting patient's functional outcome.    GOALS: Goals reviewed with patient? Yes  SHORT TERM GOALS: Target date: 03/14/2023  Patient will be I with initial HEP in order to progress with therapy. Baseline: HEP provided at eval 03/14/2023: independent with initial HEP Goal status: MET  2.  Patient will demonstrate left knee flexion AROM >/= 120 in order to improve transfers Baseline: 107 deg 03/12/2023: 120 deg Goal status: MET  3.  Patient will perform 5xSTS </=11 seconds in order to indicate improved LE strength and reduced fall risk Baseline: 13 seconds 03/14/2023: 11 seconds Goal status: MET  LONG TERM GOALS: Target date: 05/28/2023  Patient will be I with final HEP to maintain progress from PT. Baseline: HEP provided at eval 04/04/2023: progressing 04/30/2023: progressing Goal status: ONGOING  2.  Patient will report >/= 60% status on FOTO to indicate improved functional ability. Baseline: 50% 03/21/2023: 55% 04/04/2023: 56% 04/30/2023: 52% Goal status: ONGOING  3.  Patient will demonstrate left knee strength 5/5 MMT to improve walking tolerance Baseline: 4/5 MMT 04/04/2023: 4+/5 MMT 04/30/2023: 4+/5 MMT Goal status: ONGOING  4.  Patient will improve >/= 500 ft without SPC in order to improve community access  Baseline: 288 ft 04/04/2023: 320 ft without needing to use [facility] 04/30/2023: 370 ft without needing to use SPC Goal status: ONGOING   PLAN: PT FREQUENCY: 1x/week  PT DURATION: 4 weeks  PLANNED INTERVENTIONS: Therapeutic exercises,  Therapeutic activity, Neuromuscular re-education, Balance training, Gait training, Patient/Family education, Self Care, Joint mobilization, Aquatic Therapy, Dry Needling, Electrical stimulation, Cryotherapy, Moist heat, Taping, Vasopneumatic device, Ionotophoresis 4mg /ml Dexamethasone, Manual therapy, and Re-evaluation  PLAN FOR NEXT SESSION: Review HEP and progress PRN, manual/stretching for left knee and hip mobility, progress left knee and hip strengthening, balance training   Berta Minor PTA 05/21/23  1:02 PM Phone: [phone] Fax: [phone]   Date of referral: 02/05/2023 Referring provider: Colvin Caroli, PA-C Referring diagnosis? M25.562 (ICD-10-CM) - Pain in left knee  Treatment diagnosis? (if different than referring diagnosis) M25.562 (ICD-10-CM) - Pain in left knee    What was this (referring dx) caused by? Surgery (Type: left knee arthroscopy)   Ashby Dawes of Condition: Chronic (continuous duration > 3 months)              Laterality: Left   Current Functional Measure Score: FOTO 52%   Objective measurements identify impairments when they are compared to normal values, the uninvolved extremity, and prior level of function.             [x]  Yes             []  No   Objective assessment of functional ability: Minimal functional limitations              Briefly describe symptoms: Patient demonstrates limitations in left knee mobility and strength that are impacting her walking ability and functional status   How did symptoms start: Surgery   Average pain intensity:  Last 24 hours: 2/10             Past week: 0-2/10   How often does the pt experience symptoms? Occasionally   How much have the symptoms interfered with usual daily activities? A little bit   How has condition changed since care began at this facility? Better   In general, how is the patients overall health? Good     BACK PAIN (STarT Back Screening Tool) No

## 2023-05-24 NOTE — Therapy (Addendum)
OUTPATIENT PHYSICAL THERAPY TREATMENT NOTE/DC SUMMARY   Patient Name: Haley Singh MRN: [id number] DOB:[birth year], 72 y.o., female Today's Date: 05/27/2023 PHYSICAL THERAPY DISCHARGE SUMMARY  Visits from Start of Care: 19  Current functional level related to goals / functional outcomes: Goals met or plateau reached    Remaining deficits: L hip weakness and loss of ROM   Education / Equipment: HEP   Patient agrees to discharge. Patient goals were met. Patient is being discharged due to being pleased with the current functional level.   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   Hyperlipidemia    Hypertension    followed by pcp  (11-10-2019 pt stated never had a stress test)   Insomnia    MDD (major depressive disorder)    Mild persistent asthma    followed by pcp and dr Belva Crome (pulmonologist)   RLS (restless legs syndrome)    Vertebral osteomyelitis (HCC) 02/21/2021   Past Surgical History:  Procedure Laterality Date   BUBBLE STUDY  01/25/2021   Procedure: BUBBLE STUDY;  Surgeon: Jodelle Red, MD;  Location: [facility] ENDOSCOPY;  Service: Cardiovascular;;   CATARACT EXTRACTION W/ INTRAOCULAR LENS  IMPLANT, BILATERAL  2009; 2010   CYSTOSCOPY/URETEROSCOPY/HOLMIUM LASER/STENT PLACEMENT Right 08/26/2019   Procedure: CYSTOSCOPY/RETROGRADE/URETEROSCOPY/HOLMIUM LASER/STENT PLACEMENT;  Surgeon: Rene Paci, MD;  Location: [facility];  Service: Urology;  Laterality: Right;   CYSTOSCOPY/URETEROSCOPY/HOLMIUM LASER/STENT PLACEMENT Left 11/17/2019   Procedure: CYSTOSCOPY/URETEROSCOPY, RETROGRADE,LASER LITHOTRIPSY, STENT PLACEMENT;  Surgeon: Rene Paci, MD;  Location: [facility];  Service: Urology;  Laterality: Left;   CYSTOSCOPY/URETEROSCOPY/HOLMIUM LASER/STENT PLACEMENT Left 02/19/2021   Procedure: CYSTOSCOPY/URETEROSCOPY/HOLMIUM LASER/STENT PLACEMENT;  Surgeon: Crista Elliot, MD;  Location: [facility];  Service: Urology;  Laterality: Left;   EXPLORATORY LAPAROTOMY W/ BOWEL RESECTION  01-15-2004  @[facility]    small bowel resection for stricture/ benzoar   EXTRACORPOREAL SHOCK WAVE LITHOTRIPSY Right 03/27/2018   Procedure: EXTRACORPOREAL SHOCK WAVE LITHOTRIPSY (ESWL);  Surgeon: Crist Fat, MD;  Location: [facility];  Service: Urology;  Laterality: Right;   EXTRACORPOREAL SHOCK WAVE LITHOTRIPSY  x2 2008;  2009;  2015   EXTRACORPOREAL SHOCK WAVE LITHOTRIPSY Left 11/09/2022   Procedure: LEFT EXTRACORPOREAL SHOCK WAVE LITHOTRIPSY (ESWL);  Surgeon: Rene Paci, MD;  Location: [facility];  Service: Urology;  Laterality: Left;  75 MINUTES   HOLMIUM LASER APPLICATION Left 11/17/2019   Procedure: HOLMIUM LASER APPLICATION;  Surgeon: Rene Paci, MD;  Location: [facility];  Service: Urology;  Laterality: Left;   IR FLUORO GUIDED NEEDLE PLC ASPIRATION/INJECTION LOC  02/22/2021   ORIF ANKLE FRACTURE Right 09/ 2008 @[facility]    per pt has retained hardware   TEE WITHOUT CARDIOVERSION N/A 01/25/2021  Procedure: TRANSESOPHAGEAL ECHOCARDIOGRAM (TEE);  Surgeon: Jodelle Red, MD;  Location: [facility] ENDOSCOPY;  Service: Cardiovascular;  Laterality: N/A;   TONSILLECTOMY  age 37   Patient Active Problem List   Diagnosis Date Noted   Chronic pain of right knee 07/16/2022   Groin pain, left 04/23/2022    Spinal stenosis of lumbar region 04/23/2022   Back pain of lumbar region with sciatica 04/02/2022   Arthralgia 04/02/2022   Fatigue due to excessive exertion 01/31/2022   Weight gain 01/31/2022   Penicillin allergy    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   Vertebral osteomyelitis (HCC) 02/21/2021   Intractable nausea and vomiting 02/18/2021   Hypokalemia 02/18/2021   MSSA bacteremia    Discitis of lumbar region    Osteomyelitis (HCC) 01/17/2021   Mild persistent asthma without complication 05/17/2016   OSA (obstructive sleep apnea) 03/03/2014    PCP: Macy Mis, MD  REFERRING PROVIDER: Colvin Caroli, PA-C  REFERRING DIAG: Pain in left knee  THERAPY DIAG:  Chronic pain of left knee  Muscle weakness (generalized)  Other abnormalities of gait and mobility  Rationale for Evaluation and Treatment: Rehabilitation  ONSET DATE: November 2023, s/p left knee arthroscopy 10/03/22 and injection 02/05/2023   SUBJECTIVE:  SUBJECTIVE STATEMENT: No L knee pain currently, felt a twinge 2 days ago but unable to reproduce.  PERTINENT HISTORY: See PMH above  PAIN:  Are you having pain? Yes:  NPRS scale: 2/10  Pain location: Left knee Pain description: Sharp, stiff Aggravating factors: Bending the knee, walking and standing, turning a certain way Relieving factors: Lidocaine patch  PRECAUTIONS: Fall  PATIENT GOALS: Improve left knee and hip motion, improve strength and endurance of left knee and hip for walking   OBJECTIVE:  PATIENT SURVEYS:  FOTO 50% (predicted to reach 60%)  03/21/2023: 55%  04/04/2023: 56%  04/30/2023: 52%  EDEMA:  Not formally assessed, patient does visually demonstrate increased left knee and lower leg edema compared to the right  MUSCLE LENGTH: Limitations in bilateral hamstring, quad, and hip flexor flexibility   POSTURE:   Rounded shoulder, slight forward trunk lean, knee varus  PALPATION: Tender to palpation generalized left anterior knee and  bilateral joint line  LOWER EXTREMITY ROM:  Active ROM Right eval Left eval Left 03/06/2023 Left 03/12/2023  Hip flexion      Hip extension      Hip abduction      Hip adduction      Hip internal rotation      Hip external rotation 3 lacking 0 0   Knee flexion 125 107 110 120  Knee extension      Ankle dorsiflexion      Ankle plantarflexion      Ankle inversion      Ankle eversion       (Blank rows = not tested)  LOWER EXTREMITY MMT:  MMT Right eval Left eval Rt / Lt 04/02/2023 Lt 04/04/2023 Lt 04/09/2023 Left 04/30/2023 L 05/27/23  Hip flexion 3 4-       Hip extension      4- / 4- 4  Hip abduction 4- 3 4- / 4-   4 / 4 4  Hip adduction         Hip internal rotation         Hip external rotation         Knee flexion 5 4 5  / 4+ 4+     Knee extension 5 4 5  / 4+ 4+ 4+ 4+ 4+  Ankle dorsiflexion         Ankle plantarflexion         Ankle inversion         Ankle eversion          (Blank rows = not tested)  FUNCTIONAL TESTS:  5 times sit to stand: 13 seconds  03/14/2023: 11 seconds  05/27/23: 13s 2 minute walk test: 288 ft, patient carried cane for first 1:30, the required to use cane last :30 due to left knee pain  04/04/2023: 320 ft without needing to use the cane, 1 short rest break  04/30/2023: 370 ft without AD and no rest break SLS: unable on left  GAIT: Distance walked: 288 ft Assistive device utilized: Single point cane Level of assistance: Modified independence Comments: Antalgic gait on left  04/30/2023: 370 ft without AD, forward trunk lean and slight antalgic on left   TODAY'S TREATMENT:    [facility] Adult PT Treatment:                                                DATE: 05/27/23 Therapeutic Exercise: Nustep L4 6 min SAQ 5# 15x FAQ with adduction 5# 15x TKE BluTB 15x Re-assessment  [facility] Adult PT Treatment:                                                DATE: 05/21/23 Therapeutic Exercise: Nustep L4 8 min Standing heel raises 2x15 SLR 2# x15  BIL, x10 BIL SAQ 4# 15x2 2s FAQ with adduction 4# 15x2 STS x15 Manual Therapy: Taping applied to L knee using medial glide technique.  Patient instructed to remove tape with any increased pain, skin irritation or in the event the tape loosens and can not be re-applied. Cautioned to never apply Leukotape directly over skin without protective cover roll in place.    [facility] Adult PT Treatment:                                                DATE: 05/17/23 Therapeutic Exercise: QS 3s 15x2 over heel block SLR 2# 15x2 SAQ 4# 15x2 2s FAQ with adduction 4# 15x2 S/L L abduction 2# w/extension bias by PT Nustep L4 8 min Taping applied to L knee using medial glide technique.  Patient instructed to remove tape with any increased pain, skin irritation or in the event the tape loosens and can not be re-applied. Cautioned to never apply Leukotape directly over skin without protective cover roll in place.    [facility] Adult PT Treatment:                                                DATE: 05/08/23 Therapeutic Exercise: QS 2s 15x SLR 2s 15x SAQ 3# 15x2 2s FAQ with adduction 3# 15x2 S/L L abduction 3# w/extension bias by PT Taping applied to L knee using medial glide technique.  Patient instructed to remove tape with any increased pain, skin irritation or in  the event the tape loosens and can not be re-applied. Cautioned to never apply Leukotape directly over skin without protective cover roll in place.    PATIENT EDUCATION:  Education details: POC extension, HEP update Person educated: Patient Education method: Explanation, Demonstration, Handout Education comprehension: verbalized understanding, returned demonstration, and needs further education  HOME EXERCISE PROGRAM: Access Code: [email or web address]    ASSESSMENT: CLINICAL IMPRESSION: Rehab goals met or plateau in progress reached.  L knee pain 0/10, ROM and strength deficits in B hips remain and can certainly contribute to any potential knee symptoms due to  altered mechanics.  Patient ready for independent management.  HEP reviewed and updated.   OBJECTIVE IMPAIRMENTS: Abnormal gait, decreased activity tolerance, decreased balance, decreased endurance, difficulty walking, decreased ROM, decreased strength, increased edema, impaired flexibility, postural dysfunction, and pain.   ACTIVITY LIMITATIONS: bending, sitting, standing, squatting, transfers, dressing, and locomotion level  PARTICIPATION LIMITATIONS: cleaning, shopping, and community activity  PERSONAL FACTORS: Fitness, Past/current experiences, and Time since onset of injury/illness/exacerbation are also affecting patient's functional outcome.    GOALS: Goals reviewed with patient? Yes  SHORT TERM GOALS: Target date: 03/14/2023  Patient will be I with initial HEP in order to progress with therapy. Baseline: HEP provided at eval 03/14/2023: independent with initial HEP Goal status: MET  2.  Patient will demonstrate left knee flexion AROM >/= 120 in order to improve transfers Baseline: 107 deg 03/12/2023: 120 deg Goal status: MET  3.  Patient will perform 5xSTS </=11 seconds in order to indicate improved LE strength and reduced fall risk Baseline: 13 seconds 03/14/2023: 11 seconds Goal status: MET  LONG TERM GOALS: Target date: 05/28/2023  Patient will be I with final HEP to maintain progress from PT. Baseline: HEP provided at eval 04/04/2023: progressing 04/30/2023: progressing Goal status: Met  2.  Patient will report >/= 60% status on FOTO to indicate improved functional ability. Baseline: 50% 03/21/2023: 55% 04/04/2023: 56% 04/30/2023: 52% 05/27/23 63% Goal status: Met  3.  Patient will demonstrate left knee strength 5/5 MMT to improve walking tolerance Baseline: 4/5 MMT 04/04/2023: 4+/5 MMT 04/30/2023: 4+/5 MMT MMT Right eval Left eval Rt / Lt 04/02/2023 Lt 04/04/2023 Lt 04/09/2023 Left 04/30/2023 L 05/27/23  Hip flexion 3 4-       Hip extension       4- / 4- 4  Hip abduction 4- 3 4- / 4-   4 / 4 4  Hip adduction         Hip internal rotation         Hip external rotation         Knee flexion 5 4 5  / 4+ 4+     Knee extension 5 4 5  / 4+ 4+ 4+ 4+ 4+   Goal status: Plateau in progress  4.  Patient will improve >/= 500 ft without SPC in order to improve community access  Baseline: 288 ft 04/04/2023: 320 ft without needing to use [facility] 04/30/2023: 370 ft without needing to use [facility] 05/27/23 331ft Goal status: Plateau in function   PLAN: PT FREQUENCY: 1x/week  PT DURATION: 4 weeks  PLANNED INTERVENTIONS: Therapeutic exercises, Therapeutic activity, Neuromuscular re-education, Balance training, Gait training, Patient/Family education, Self Care, Joint mobilization, Aquatic Therapy, Dry Needling, Electrical stimulation, Cryotherapy, Moist heat, Taping, Vasopneumatic device, Ionotophoresis 4mg /ml Dexamethasone, Manual therapy, and Re-evaluation  PLAN FOR NEXT SESSION: Review HEP and progress PRN, manual/stretching for left knee and hip mobility, progress left knee and hip strengthening, balance training  Farris Has Jerime Arif PT 05/27/23  3:30 PM Phone: [phone] Fax: [phone]   Date of referral: 02/05/2023 Referring provider: Colvin Caroli, PA-C Referring diagnosis? M25.562 (ICD-10-CM) - Pain in left knee  Treatment diagnosis? (if different than referring diagnosis) M25.562 (ICD-10-CM) - Pain in left knee    What was this (referring dx) caused by? Surgery (Type: left knee arthroscopy)   Ashby Dawes of Condition: Chronic (continuous duration > 3 months)              Laterality: Left   Current Functional Measure Score: FOTO 52%   Objective measurements identify impairments when they are compared to normal values, the uninvolved extremity, and prior level of function.             [x]  Yes             []  No   Objective assessment of functional ability: Minimal functional limitations              Briefly describe  symptoms: Patient demonstrates limitations in left knee mobility and strength that are impacting her walking ability and functional status   How did symptoms start: Surgery   Average pain intensity:             Last 24 hours: 2/10             Past week: 0-2/10   How often does the pt experience symptoms? Occasionally   How much have the symptoms interfered with usual daily activities? A little bit   How has condition changed since care began at this facility? Better   In general, how is the patients overall health? Good     BACK PAIN (STarT Back Screening Tool) No

## 2023-05-27 ENCOUNTER — Ambulatory Visit: Payer: Medicare Other

## 2023-05-27 DIAGNOSIS — G8929 Other chronic pain: Secondary | ICD-10-CM

## 2023-05-27 DIAGNOSIS — R2689 Other abnormalities of gait and mobility: Secondary | ICD-10-CM

## 2023-05-27 DIAGNOSIS — M25562 Pain in left knee: Secondary | ICD-10-CM | POA: Diagnosis not present

## 2023-05-27 DIAGNOSIS — M6281 Muscle weakness (generalized): Secondary | ICD-10-CM

## 2023-06-25 ENCOUNTER — Telehealth: Payer: Self-pay | Admitting: Pulmonary Disease

## 2023-06-27 ENCOUNTER — Encounter: Payer: Self-pay | Admitting: Pulmonary Disease

## 2023-06-27 ENCOUNTER — Ambulatory Visit: Payer: Medicare Other | Admitting: Pulmonary Disease

## 2023-06-27 VITALS — BP 153/82 | HR 71 | Ht 66.0 in | Wt 213.0 lb

## 2023-06-27 DIAGNOSIS — J4541 Moderate persistent asthma with (acute) exacerbation: Secondary | ICD-10-CM

## 2023-06-27 MED ORDER — PREDNISONE 10 MG PO TABS: 40.0000 mg | ORAL_TABLET | Freq: Every day | ORAL | 0 refills | Status: AC

## 2023-06-27 MED ORDER — FLUTICASONE-SALMETEROL 500-50 MCG/ACT IN AEPB: 1.0000 | INHALATION_SPRAY | Freq: Two times a day (BID) | RESPIRATORY_TRACT | 3 refills | Status: AC

## 2023-06-27 NOTE — Progress Notes (Deleted)
Haley Singh    [id number]    [birth year]  Primary Care Physician:Briscoe, Sharrie Rothman, MD  Referring Physician: Macy Mis, MD [address] [place],  [place] [address]  Chief complaint:   Follow up for Mild Persistent Asthma.  HPI: Haley Singh is 73 y.o. with past medical history of asthma, hypertension, hyperlipidemia, depression. She has been diagnosed with asthma about 15 years ago. She was initially on Advair which was then switched to Qvar.  She was then changed to [facility] in 2018 as symptoms are not well controlled. She saw an allergist asked about 15 years ago and was told that she had sensitivity to dust, dogs, cats, weeds.  Did not tolerate coming off of Breo in 2020. 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 She does not have any exposures at work or at home. She does not have any mold issues of pets. She is a nonsmoker. Developed osteomyelitis of her lumbar spine with MSSA bacteremia with hospitalizations in 2022.  She went on a prolonged antibiotic therapy as an outpatient with clearance of infection.   Interim History: Overall breathing is stable.  Continues on Breo inhaler.  Hardly needs to use her rescue inhaler  Outpatient Encounter Medications as of 06/27/2023  Medication Sig   amLODipine (NORVASC) 5 MG tablet Take 5 mg by mouth daily.   atorvastatin (LIPITOR) 40 MG tablet Take 40 mg by mouth daily.    BREO ELLIPTA 100-25 MCG/ACT AEPB INHALE 1 DOSE BY MOUTH DAILY   celecoxib (CELEBREX) 200 MG capsule Take by mouth.   cetirizine (ZYRTEC) 10 MG tablet Take 10 mg by mouth every morning.   famotidine (PEPCID) 40 MG tablet Take 40 mg by mouth daily.   fluticasone (FLONASE) 50 MCG/ACT nasal spray Place 1 spray into both nostrils 2 (two) times daily.    gabapentin (NEURONTIN) 300 MG capsule Take 600 mg by mouth at bedtime.   hydrALAZINE (APRESOLINE) 25 MG tablet Take 1  tablet (25 mg total) by mouth every 8 (eight) hours.   losartan (COZAAR) 100 MG tablet Take 1 tablet (100 mg total) by mouth daily.   montelukast (SINGULAIR) 10 MG tablet Take 10 mg by mouth daily.    traZODone (DESYREL) 150 MG tablet Take 150 mg by mouth at bedtime.    venlafaxine XR (EFFEXOR-XR) 150 MG 24 hr capsule Take 150 mg by mouth every morning.    [DISCONTINUED] oxyCODONE (OXY IR/ROXICODONE) 5 MG immediate release tablet Take 1 tablet (5 mg total) by mouth every 4 (four) hours as needed for severe pain.   No facility-administered encounter medications on file as of 06/27/2023.   Physical Exam: Blood pressure 138/82, pulse 82, temperature 97.7 F (36.5 C), temperature source Oral, height 5\' 6"  (1.676 m), weight 219 lb 3.2 oz (99.4 kg), SpO2 98 %. Gen:      No acute distress HEENT:  EOMI, sclera anicteric Neck:     No masses; no thyromegaly Lungs:    Clear to auscultation bilaterally; normal respiratory effort CV:         Regular rate and rhythm; no murmurs Abd:      + bowel sounds; soft, non-tender; no palpable masses, no distension Ext:    No edema; adequate peripheral perfusion Skin:      Warm and dry; no rash Neuro: alert and oriented x 3 Psych: normal mood and  affect   Data Reviewed: Sleep Study 05/19/13- This study did not show evidence for sleep disordered breathing.  Chest x-ray 12/16/2020-patchy interstitial prominence with peribronchial cuffing I reviewed the images personally.  PFTs: Spirometry 01/16/16 FVC 2.56 (74%), FEV1 1.88 [71%), F/F 73.3 No obstruction, restriction possible.                                                         PFTs 07/06/16 FVC 2.81 (84%), FEV1 2.37 (93%), F/F 84, TLC 113%, DLCO 64% Isolated reduction in diffusion capacity.  FENO 05/17/16- 31 FENO 2/2/118- 23   FENO 05/10/17- 18 FENO 11/28/17-22  ACT score 09/05/2021- 23  CBC 02/22/2021-WBC 9.9, eos 0% Blood allergy profile 05/17/16- Postive for ragweed allergy. IgE 36  Assessment:   Chronic cough, secondary to ACE inhibitor.   Off Benzapril and on losartan improvement in symptoms.  Mild persistent asthma Continue Breo, albuterol as needed Continue Singulair  Allergies, postnasal drip Zyrtec, Flonase over-the-counter  Plan/Recommendations: - Breo, albuterol as needed - Zyrtec, Singulair, Flonase.  Haley Greathouse MD [facility] 06/27/2023, 11:30 AM  CC: Haley Mis, MD

## 2023-06-27 NOTE — Progress Notes (Signed)
SHADEN GULDAN    [id number]    08/19/1950  Primary Care Physician:Briscoe, Sharrie Rothman, MD  Referring Physician: Macy Mis, MD [address] [place],  [place] [address]  Chief complaint:   Follow up for Mild Persistent Asthma. Acute visit for exacerbation  HPI: [name] is 73 y.o. with past medical history of asthma, hypertension, hyperlipidemia, depression. She has been diagnosed with asthma about 15 years ago. She was initially on Advair which was then switched to Qvar.  She was then changed to [facility] in 2018 as symptoms are not well controlled. She saw an allergist asked about 15 years ago and was told that she had sensitivity to dust, dogs, cats, weeds.  Did not tolerate coming off of Breo in 2020. 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 She does not have any exposures at work or at home. She does not have any mold issues of pets. She is a nonsmoker. Developed osteomyelitis of her lumbar spine with MSSA bacteremia with hospitalizations in 2022.  She went on a prolonged antibiotic therapy as an outpatient with clearance of infection.   Interim History: Discussed the use of AI scribe software for clinical note transcription with the patient, who gave verbal consent to proceed.  The patient, with a history of asthma, presents with concerns about the high cost of her Breo inhaler, which has risen to $421 per month, making it cost-prohibitive. She has been using her rescue inhaler more frequently over the past three months, up to twice a day, particularly when exerting herself at the gym. She reports that she starts to cough when she tries to take deep breaths. She has been adherent with her Breo inhaler and has not had any changes in her exposures. She has also been taking Singulair, but is unsure if it is still effective.   Outpatient Encounter Medications as of 06/27/2023   Medication Sig   amLODipine (NORVASC) 5 MG tablet Take 5 mg by mouth daily.   atorvastatin (LIPITOR) 40 MG tablet Take 40 mg by mouth daily.    BREO ELLIPTA 100-25 MCG/ACT AEPB INHALE 1 DOSE BY MOUTH DAILY   celecoxib (CELEBREX) 200 MG capsule Take by mouth.   cetirizine (ZYRTEC) 10 MG tablet Take 10 mg by mouth every morning.   famotidine (PEPCID) 40 MG tablet Take 40 mg by mouth daily.   fluticasone (FLONASE) 50 MCG/ACT nasal spray Place 1 spray into both nostrils 2 (two) times daily.    gabapentin (NEURONTIN) 300 MG capsule Take 600 mg by mouth at bedtime.   hydrALAZINE (APRESOLINE) 25 MG tablet Take 1 tablet (25 mg total) by mouth every 8 (eight) hours.   losartan (COZAAR) 100 MG tablet Take 1 tablet (100 mg total) by mouth daily.   montelukast (SINGULAIR) 10 MG tablet Take 10 mg by mouth daily.    traZODone (DESYREL) 150 MG tablet Take 150 mg by mouth at bedtime.    venlafaxine XR (EFFEXOR-XR) 150 MG 24 hr capsule Take 150 mg by mouth every morning.    [DISCONTINUED] oxyCODONE (OXY IR/ROXICODONE) 5 MG immediate release tablet Take 1 tablet (5 mg total) by mouth every 4 (four) hours as needed for severe pain.   No facility-administered encounter medications on file as of 06/27/2023.   Physical Exam: Blood pressure (!) 153/82, pulse 71, height 5\' 6"  (1.676 m), weight 213 lb (96.6  kg), SpO2 95%. Gen:      No acute distress HEENT:  EOMI, sclera anicteric Neck:     No masses; no thyromegaly Lungs:    Clear to auscultation bilaterally; normal respiratory effort CV:         Regular rate and rhythm; no murmurs Abd:      + bowel sounds; soft, non-tender; no palpable masses, no distension Ext:    No edema; adequate peripheral perfusion Skin:      Warm and dry; no rash Neuro: alert and oriented x 3 Psych: normal mood and affect   Data Reviewed: Sleep Study 05/19/13- This study did not show evidence for sleep disordered breathing. Chest x-ray 12/16/2020-patchy interstitial prominence with  peribronchial cuffing I reviewed the images personally.  PFTs: Spirometry 01/16/16 FVC 2.56 (74%), FEV1 1.88 [71%), F/F 73.3 No obstruction, restriction possible.                                                         PFTs 07/06/16 FVC 2.81 (84%), FEV1 2.37 (93%), F/F 84, TLC 113%, DLCO 64% Isolated reduction in diffusion capacity.  FENO 05/17/16- 31 FENO 2/2/118- 23   FENO 05/10/17- 18 FENO 11/28/17-22  ACT score 09/05/2021- 23  CBC 02/22/2021-WBC 9.9, eos 0% Blood allergy profile 05/17/16- Postive for ragweed allergy. IgE 36  Assessment:  Asthma exacerbation Increased use of rescue inhaler, possibly due to cost-prohibitive Breo. Mild wheezing on exam. No sputum production or fever.  -Change Breo to generic Wixela, pending cost investigation with pharmacy. -Start Prednisone 40 mg/day for 5 days to help with current symptoms. -Continue Singulair as prescribed. -Continue use of rescue inhaler as needed, with a maximum of twice daily.  Chronic cough, secondary to ACE inhibitor.   Off Benzapril and on losartan improvement in symptoms.  Allergies, postnasal drip Zyrtec, Flonase over-the-counter  Follow-up appointment scheduled for April 2025.     Plan/Recommendations: - Change Breo to [employer] - Prednisone - Zyrtec, Singulair, Flonase.  Chilton Greathouse MD [facility] 06/27/2023, 11:28 AM  CC: Macy Mis, MD

## 2023-06-27 NOTE — Patient Instructions (Signed)
VISIT SUMMARY:  You came in today because the cost of your Breo inhaler has become too high, and you have been using your rescue inhaler more frequently, especially during exercise. You also mentioned experiencing coughing when taking deep breaths. We discussed your current asthma management and made some adjustments to your treatment plan.  YOUR PLAN:  -ASTHMA: Asthma is a condition where your airways narrow and swell, making it difficult to breathe. To address the increased use of your rescue inhaler and the high cost of Breo, we will switch you to a generic inhaler called Rixela, pending cost confirmation with the pharmacy. You will also start a 5-day course of Prednisone to help with your current symptoms. Continue taking Singulair as prescribed and use your rescue inhaler as needed, but no more than twice daily.  INSTRUCTIONS:  Please follow up with [place] in April 2025 for your next appointment.

## 2023-06-27 NOTE — Telephone Encounter (Signed)
NFN 

## 2023-12-18 ENCOUNTER — Ambulatory Visit: Admitting: Primary Care

## 2024-02-05 ENCOUNTER — Other Ambulatory Visit: Payer: Self-pay | Admitting: Urology

## 2024-02-05 DIAGNOSIS — D49511 Neoplasm of unspecified behavior of right kidney: Secondary | ICD-10-CM

## 2024-03-01 ENCOUNTER — Ambulatory Visit
Admission: RE | Admit: 2024-03-01 | Discharge: 2024-03-01 | Disposition: A | Source: Ambulatory Visit | Attending: Urology

## 2024-03-01 DIAGNOSIS — D49511 Neoplasm of unspecified behavior of right kidney: Secondary | ICD-10-CM

## 2024-03-01 MED ORDER — GADOPICLENOL 0.5 MMOL/ML IV SOLN
10.0000 mL | Freq: Once | INTRAVENOUS | Status: AC | PRN
  Administered 2024-03-01: 10 mL via INTRAVENOUS

## 2024-06-03 ENCOUNTER — Other Ambulatory Visit: Payer: Self-pay | Admitting: Urology

## 2024-06-03 DIAGNOSIS — N281 Cyst of kidney, acquired: Secondary | ICD-10-CM

## 2024-07-13 ENCOUNTER — Ambulatory Visit (HOSPITAL_BASED_OUTPATIENT_CLINIC_OR_DEPARTMENT_OTHER): Admitting: Student
